# Patient Record
Sex: Male | Born: 1955
Health system: Southern US, Community
[De-identification: ages and names within clinical notes are randomized; demographics above are authoritative.]

## PROBLEM LIST (undated history)

## (undated) DIAGNOSIS — R918 Other nonspecific abnormal finding of lung field: Secondary | ICD-10-CM

## (undated) DIAGNOSIS — K631 Perforation of intestine (nontraumatic): Secondary | ICD-10-CM

## (undated) DIAGNOSIS — C3490 Malignant neoplasm of unspecified part of unspecified bronchus or lung: Secondary | ICD-10-CM

## (undated) DIAGNOSIS — J45909 Unspecified asthma, uncomplicated: Secondary | ICD-10-CM

## (undated) DIAGNOSIS — E669 Obesity, unspecified: Secondary | ICD-10-CM

## (undated) DIAGNOSIS — C3492 Malignant neoplasm of unspecified part of left bronchus or lung: Secondary | ICD-10-CM

## (undated) DIAGNOSIS — I5032 Chronic diastolic (congestive) heart failure: Secondary | ICD-10-CM

## (undated) DIAGNOSIS — R5382 Chronic fatigue, unspecified: Secondary | ICD-10-CM

## (undated) DIAGNOSIS — I700 Atherosclerosis of aorta: Secondary | ICD-10-CM

## (undated) HISTORY — DX: Malignant neoplasm of unspecified part of left bronchus or lung: C34.92

## (undated) HISTORY — PX: HERNIA REPAIR: SHX51

## (undated) HISTORY — DX: Other nonspecific abnormal finding of lung field: R91.8

## (undated) HISTORY — DX: Chronic fatigue, unspecified: R53.82

## (undated) HISTORY — DX: Malignant neoplasm of unspecified part of unspecified bronchus or lung: C34.90

## (undated) HISTORY — PX: CHOLECYSTECTOMY: SHX55

## (undated) HISTORY — DX: Obesity, unspecified: E66.9

## (undated) HISTORY — DX: Chronic diastolic (congestive) heart failure: I50.32

---

## 2003-04-23 ENCOUNTER — Emergency Department (HOSPITAL_COMMUNITY): Admission: EM | Admit: 2003-04-23 | Discharge: 2003-04-23 | Payer: Self-pay | Admitting: Emergency Medicine

## 2003-04-23 ENCOUNTER — Encounter: Payer: Self-pay | Admitting: Emergency Medicine

## 2003-05-25 ENCOUNTER — Encounter: Payer: Self-pay | Admitting: Orthopaedic Surgery

## 2003-05-25 ENCOUNTER — Ambulatory Visit (HOSPITAL_COMMUNITY): Admission: RE | Admit: 2003-05-25 | Discharge: 2003-05-25 | Payer: Self-pay | Admitting: Orthopaedic Surgery

## 2005-05-10 ENCOUNTER — Ambulatory Visit (HOSPITAL_COMMUNITY): Admission: RE | Admit: 2005-05-10 | Discharge: 2005-05-10 | Payer: Self-pay | Admitting: Family Medicine

## 2005-05-15 ENCOUNTER — Ambulatory Visit (HOSPITAL_COMMUNITY): Admission: RE | Admit: 2005-05-15 | Discharge: 2005-05-15 | Payer: Self-pay | Admitting: Family Medicine

## 2005-05-30 ENCOUNTER — Ambulatory Visit (HOSPITAL_COMMUNITY): Admission: RE | Admit: 2005-05-30 | Discharge: 2005-05-30 | Payer: Self-pay | Admitting: Family Medicine

## 2009-12-24 ENCOUNTER — Ambulatory Visit (HOSPITAL_COMMUNITY): Admission: RE | Admit: 2009-12-24 | Discharge: 2009-12-24 | Payer: Self-pay | Admitting: Family Medicine

## 2010-01-01 ENCOUNTER — Emergency Department (HOSPITAL_COMMUNITY): Admission: EM | Admit: 2010-01-01 | Discharge: 2010-01-01 | Payer: Self-pay | Admitting: Emergency Medicine

## 2010-01-01 ENCOUNTER — Ambulatory Visit (HOSPITAL_COMMUNITY): Admission: RE | Admit: 2010-01-01 | Discharge: 2010-01-01 | Payer: Self-pay | Admitting: Emergency Medicine

## 2010-01-07 ENCOUNTER — Ambulatory Visit (HOSPITAL_COMMUNITY): Admission: RE | Admit: 2010-01-07 | Discharge: 2010-01-07 | Payer: Self-pay | Admitting: Internal Medicine

## 2010-01-12 ENCOUNTER — Emergency Department (HOSPITAL_COMMUNITY): Admission: EM | Admit: 2010-01-12 | Discharge: 2010-01-13 | Payer: Self-pay | Admitting: Emergency Medicine

## 2010-01-16 ENCOUNTER — Emergency Department (HOSPITAL_COMMUNITY): Admission: EM | Admit: 2010-01-16 | Discharge: 2010-01-16 | Payer: Self-pay | Admitting: Emergency Medicine

## 2010-01-17 ENCOUNTER — Inpatient Hospital Stay (HOSPITAL_COMMUNITY): Admission: AD | Admit: 2010-01-17 | Discharge: 2010-01-23 | Payer: Self-pay

## 2010-01-21 ENCOUNTER — Encounter (INDEPENDENT_AMBULATORY_CARE_PROVIDER_SITE_OTHER): Payer: Self-pay

## 2010-02-02 ENCOUNTER — Encounter (INDEPENDENT_AMBULATORY_CARE_PROVIDER_SITE_OTHER): Payer: Self-pay | Admitting: *Deleted

## 2010-10-06 ENCOUNTER — Emergency Department (HOSPITAL_COMMUNITY): Admission: EM | Admit: 2010-10-06 | Discharge: 2009-12-31 | Payer: Self-pay | Admitting: Emergency Medicine

## 2010-11-20 ENCOUNTER — Encounter: Payer: Self-pay | Admitting: Internal Medicine

## 2010-11-20 ENCOUNTER — Encounter: Payer: Self-pay | Admitting: Family Medicine

## 2010-11-29 NOTE — Letter (Signed)
Summary: Generic Letter, Intro to Referring  [facility]  [address]   [place], [place] [address]   Phone: [phone]  Fax: [phone]      February 02, 2010             RE: James Zamora   1956/04/15                 [address]                 [place], [place]  [address]  Dear Kemper Durie,    The above mentioned patient has had three appointments with our office. The first two he cancelled and then today he was a noshow. He will need another referral before he can make another appointment with our office.   Sincerely,    [facility] Ph: [phone]   Fax: [phone]

## 2011-01-23 LAB — COMPREHENSIVE METABOLIC PANEL
ALT: 10 U/L (ref 0–53)
AST: 10 U/L (ref 0–37)
Albumin: 3.1 g/dL — ABNORMAL LOW (ref 3.5–5.2)
Albumin: 3.5 g/dL (ref 3.5–5.2)
Alkaline Phosphatase: 64 U/L (ref 39–117)
Calcium: 8.9 mg/dL (ref 8.4–10.5)
Chloride: 101 mEq/L (ref 96–112)
GFR calc Af Amer: >60 mL/min (ref >60)
GFR calc Af Amer: >60 mL/min (ref >60)
Glucose, Bld: 93 mg/dL (ref 70–99)
Potassium: 4 mEq/L (ref 3.5–5.1)
Sodium: 135 mEq/L (ref 135–145)
Total Bilirubin: 0.6 mg/dL (ref 0.3–1.2)
Total Protein: 8.2 g/dL (ref 6.0–8.3)

## 2011-01-23 LAB — DIFFERENTIAL
Basophils Absolute: 0 10*3/uL (ref 0.0–0.1)
Basophils Absolute: 0 10*3/uL (ref 0.0–0.1)
Basophils Absolute: 0 10*3/uL (ref 0.0–0.1)
Basophils Relative: 0 % (ref 0–1)
Basophils Relative: 0 % (ref 0–1)
Eosinophils Absolute: 0 10*3/uL (ref 0.0–0.7)
Eosinophils Absolute: 0.1 10*3/uL (ref 0.0–0.7)
Eosinophils Absolute: 0.1 10*3/uL (ref 0.0–0.7)
Eosinophils Absolute: 0.2 10*3/uL (ref 0.0–0.7)
Eosinophils Absolute: 0.2 10*3/uL (ref 0.0–0.7)
Eosinophils Relative: 0 % (ref 0–5)
Eosinophils Relative: 2 % (ref 0–5)
Eosinophils Relative: 2 % (ref 0–5)
Eosinophils Relative: 3 % (ref 0–5)
Lymphocytes Relative: 14 % (ref 12–46)
Lymphocytes Relative: 22 % (ref 12–46)
Lymphocytes Relative: 8 % — ABNORMAL LOW (ref 12–46)
Lymphs Abs: 0.7 10*3/uL (ref 0.7–4.0)
Lymphs Abs: 0.9 10*3/uL (ref 0.7–4.0)
Lymphs Abs: 1.4 10*3/uL (ref 0.7–4.0)
Lymphs Abs: 1.6 10*3/uL (ref 0.7–4.0)
Lymphs Abs: 1.8 10*3/uL (ref 0.7–4.0)
Monocytes Absolute: 0 10*3/uL — ABNORMAL LOW (ref 0.1–1.0)
Monocytes Absolute: 0.4 10*3/uL (ref 0.1–1.0)
Monocytes Absolute: 0.4 10*3/uL (ref 0.1–1.0)
Monocytes Relative: 0 % — ABNORMAL LOW (ref 3–12)
Monocytes Relative: 2 % — ABNORMAL LOW (ref 3–12)
Monocytes Relative: 3 % (ref 3–12)
Monocytes Relative: 4 % (ref 3–12)
Monocytes Relative: 5 % (ref 3–12)
Neutro Abs: 6.2 10*3/uL (ref 1.7–7.7)
Neutro Abs: 7.8 10*3/uL — ABNORMAL HIGH (ref 1.7–7.7)
Neutrophils Relative %: 78 % — ABNORMAL HIGH (ref 43–77)
Neutrophils Relative %: 79 % — ABNORMAL HIGH (ref 43–77)
Neutrophils Relative %: 82 % — ABNORMAL HIGH (ref 43–77)
Neutrophils Relative %: 82 % — ABNORMAL HIGH (ref 43–77)

## 2011-01-23 LAB — CBC
HCT: 40.3 % (ref 39.0–52.0)
HCT: 40.5 % (ref 39.0–52.0)
HCT: 42.6 % (ref 39.0–52.0)
HCT: 43.8 % (ref 39.0–52.0)
Hemoglobin: 12.3 g/dL — ABNORMAL LOW (ref 13.0–17.0)
Hemoglobin: 12.5 g/dL — ABNORMAL LOW (ref 13.0–17.0)
Hemoglobin: 13 g/dL (ref 13.0–17.0)
Hemoglobin: 13.8 g/dL (ref 13.0–17.0)
Hemoglobin: 14.4 g/dL (ref 13.0–17.0)
MCHC: 31.7 g/dL (ref 30.0–36.0)
MCHC: 32.7 g/dL (ref 30.0–36.0)
MCV: 74.8 fL — ABNORMAL LOW (ref 78.0–100.0)
MCV: 74.8 fL — ABNORMAL LOW (ref 78.0–100.0)
MCV: 75 fL — ABNORMAL LOW (ref 78.0–100.0)
Platelets: 252 10*3/uL (ref 150–400)
Platelets: 261 10*3/uL (ref 150–400)
Platelets: 262 10*3/uL (ref 150–400)
Platelets: 294 10*3/uL (ref 150–400)
Platelets: 321 10*3/uL (ref 150–400)
Platelets: 341 10*3/uL (ref 150–400)
Platelets: 341 10*3/uL (ref 150–400)
RBC: 5.01 MIL/uL (ref 4.22–5.81)
RBC: 5.41 MIL/uL (ref 4.22–5.81)
RDW: 16 % — ABNORMAL HIGH (ref 11.5–15.5)
RDW: 16.7 % — ABNORMAL HIGH (ref 11.5–15.5)
RDW: 17 % — ABNORMAL HIGH (ref 11.5–15.5)
RDW: 17.3 % — ABNORMAL HIGH (ref 11.5–15.5)
WBC: 11 10*3/uL — ABNORMAL HIGH (ref 4.0–10.5)
WBC: 11 10*3/uL — ABNORMAL HIGH (ref 4.0–10.5)
WBC: 12.1 10*3/uL — ABNORMAL HIGH (ref 4.0–10.5)
WBC: 8.3 10*3/uL (ref 4.0–10.5)
WBC: 8.3 10*3/uL (ref 4.0–10.5)

## 2011-01-23 LAB — ANTI-SMITH ANTIBODY: ENA SM Ab Ser-aCnc: <0.2 AI (ref <1.0)

## 2011-01-23 LAB — ANTIPHOSPHOLIPID SYNDROME EVAL, BLD
Anticardiolipin IgG: 5 GPL U/mL — ABNORMAL LOW (ref <23)
Anticardiolipin IgM: 3 MPL U/mL — ABNORMAL LOW (ref <11)
Phosphatydalserine, IgG: <10 U/mL (ref <10)
Phosphatydalserine, IgM: >100 U/mL — ABNORMAL HIGH (ref <25)

## 2011-01-23 LAB — GLUCOSE, SEROUS FLUID

## 2011-01-23 LAB — ANA: Anti Nuclear Antibody(ANA): POSITIVE — AB

## 2011-01-23 LAB — HEPATIC FUNCTION PANEL
ALT: 14 U/L (ref 0–53)
AST: 15 U/L (ref 0–37)
AST: 18 U/L (ref 0–37)
Albumin: 3.2 g/dL — ABNORMAL LOW (ref 3.5–5.2)
Alkaline Phosphatase: 72 U/L (ref 39–117)
Alkaline Phosphatase: 73 U/L (ref 39–117)
Bilirubin, Direct: 0.2 mg/dL (ref 0.0–0.3)
Indirect Bilirubin: 0.5 mg/dL (ref 0.3–0.9)
Total Bilirubin: 0.3 mg/dL (ref 0.3–1.2)

## 2011-01-23 LAB — LUPUS ANTICOAGULANT PANEL: PTT Lupus Anticoagulant: 34.5 secs (ref 32.0–43.4)

## 2011-01-23 LAB — BODY FLUID CELL COUNT WITH DIFFERENTIAL
Eos, Fluid: 19 %
Lymphs, Fluid: 34 %
Monocyte-Macrophage-Serous Fluid: 43 % — ABNORMAL LOW (ref 50–90)
Neutrophil Count, Fluid: 4 % (ref 0–25)
Total Nucleated Cell Count, Fluid: 2330 cu mm — ABNORMAL HIGH (ref 0–1000)

## 2011-01-23 LAB — ANTI-NUCLEAR AB-TITER (ANA TITER): ANA Titer 1: 1:80 {titer} — ABNORMAL HIGH

## 2011-01-23 LAB — POCT I-STAT, CHEM 8
Creatinine, Ser: 0.9 mg/dL (ref 0.4–1.5)
HCT: 43 % (ref 39.0–52.0)
Hemoglobin: 14.6 g/dL (ref 13.0–17.0)
Potassium: 4.3 mEq/L (ref 3.5–5.1)
Sodium: 135 mEq/L (ref 135–145)

## 2011-01-23 LAB — BASIC METABOLIC PANEL
BUN: 12 mg/dL (ref 6–23)
CO2: 30 mEq/L (ref 19–32)
Calcium: 8.9 mg/dL (ref 8.4–10.5)
Calcium: 9 mg/dL (ref 8.4–10.5)
Creatinine, Ser: 1.2 mg/dL (ref 0.4–1.5)
GFR calc non Af Amer: >60 mL/min (ref >60)
GFR calc non Af Amer: >60 mL/min (ref >60)
Glucose, Bld: 101 mg/dL — ABNORMAL HIGH (ref 70–99)
Potassium: 3.7 mEq/L (ref 3.5–5.1)
Potassium: 4.1 mEq/L (ref 3.5–5.1)
Sodium: 136 mEq/L (ref 135–145)
Sodium: 137 mEq/L (ref 135–145)

## 2011-01-23 LAB — PROTEIN, BODY FLUID: Total protein, fluid: 5.2 g/dL

## 2011-01-23 LAB — AMYLASE: Amylase: 49 U/L (ref 0–105)

## 2011-01-23 LAB — URINALYSIS, ROUTINE W REFLEX MICROSCOPIC
Ketones, ur: NEGATIVE mg/dL
Specific Gravity, Urine: >1.03 — ABNORMAL HIGH (ref 1.005–1.030)
pH: 6 (ref 5.0–8.0)

## 2011-01-23 LAB — POCT CARDIAC MARKERS: Myoglobin, poc: 111 ng/mL (ref 12–200)

## 2011-01-23 LAB — MAGNESIUM: Magnesium: 1.9 mg/dL (ref 1.5–2.5)

## 2011-01-23 LAB — RHEUMATOID FACTOR: Rhuematoid fact SerPl-aCnc: 153 IU/mL — ABNORMAL HIGH (ref 0–20)

## 2011-01-23 LAB — BODY FLUID CULTURE: Culture: NO GROWTH

## 2011-01-23 LAB — AFB CULTURE WITH SMEAR (NOT AT ARMC)

## 2011-01-23 LAB — ANTI-DNA ANTIBODY, DOUBLE-STRANDED: ds DNA Ab: 1 IU/mL (ref <5)

## 2011-06-12 ENCOUNTER — Encounter: Payer: Self-pay | Admitting: *Deleted

## 2011-06-12 ENCOUNTER — Emergency Department (HOSPITAL_COMMUNITY)
Admission: EM | Admit: 2011-06-12 | Discharge: 2011-06-12 | Disposition: A | Discharge status: Home / Self Care | Payer: Medicare Other | Attending: Emergency Medicine | Admitting: Emergency Medicine

## 2011-06-12 DIAGNOSIS — L0390 Cellulitis, unspecified: Secondary | ICD-10-CM

## 2011-06-12 DIAGNOSIS — L02419 Cutaneous abscess of limb, unspecified: Secondary | ICD-10-CM | POA: Insufficient documentation

## 2011-06-12 LAB — CBC
HCT: 40.4 % (ref 39.0–52.0)
Hemoglobin: 12.6 g/dL — ABNORMAL LOW (ref 13.0–17.0)
MCV: 78.6 fL (ref 78.0–100.0)
RBC: 5.14 MIL/uL (ref 4.22–5.81)
RDW: 14.3 % (ref 11.5–15.5)
WBC: 7.5 10*3/uL (ref 4.0–10.5)

## 2011-06-12 LAB — BASIC METABOLIC PANEL
BUN: 8 mg/dL (ref 6–23)
CO2: 27 mEq/L (ref 19–32)
Chloride: 100 mEq/L (ref 96–112)
GFR calc Af Amer: >60 mL/min (ref >60)
Potassium: 4.3 mEq/L (ref 3.5–5.1)

## 2011-06-12 MED ORDER — CEPHALEXIN 500 MG PO CAPS: 500.0000 mg | ORAL_CAPSULE | Freq: Four times a day (QID) | ORAL | Status: AC

## 2011-06-12 NOTE — ED Notes (Signed)
Pt will not answer questions. Mother states pt has not been taking any of his medication like he should. States pt's legs have been swollen for a while. Mom states they take the same kind of medication and she has been given him some of hers

## 2011-06-12 NOTE — ED Notes (Signed)
Pt c/o swelling and drainage to his left leg for a long time. Pt will not tell details. Pt's mother is unsure of how long it has been like that. Extreme swelling noted to left lower extremity. Foul smell noted from leg also. Pt has dressing in place.

## 2011-06-12 NOTE — ED Notes (Signed)
Pt waiting for lab result. Delay explained to pt and family.

## 2011-07-18 NOTE — ED Provider Notes (Signed)
History     CSN: [id number] Arrival date & time: 06/12/2011 11:19 AM   Chief Complaint  Patient presents with  . Leg Swelling     (Include location/radiation/quality/duration/timing/severity/associated sxs/prior treatment) HPI  He c/o left leg swelling with sores on it and a foul odor. No fever or chills   History reviewed. No pertinent past medical history.   Past Surgical History  Procedure Date  . Cholecystectomy     Family History  Problem Relation Age of Onset  . Diabetes Mother   . Asthma Father     History  Substance Use Topics  . Smoking status: Never Smoker   . Smokeless tobacco: Not on file  . Alcohol Use: No      Review of Systems  HENT: Negative for nosebleeds.   Respiratory: Negative for cough.   Cardiovascular: Negative for chest pain.  Gastrointestinal: Negative for abdominal pain.  Skin: Positive for rash.       redness  Neurological: Negative for headaches.    Allergies  Bee and Penicillins  Home Medications   Current Outpatient Rx  Name Route Sig Dispense Refill  . DIPHENHYDRAMINE HCL 25 MG PO CAPS Oral Take 25 mg by mouth every 6 (six) hours as needed. For allergies       Physical Exam    BP 136/79  Pulse 92  Temp(Src) 98.5 F (36.9 C) (Oral)  Resp 22  SpO2 94%  Physical Exam  Constitutional: He appears well-developed and well-nourished.  HENT:  Head: Normocephalic and atraumatic.  Eyes: Pupils are equal, round, and reactive to light.  Neck: Normal range of motion.  Abdominal: He exhibits no distension.  Musculoskeletal: He exhibits edema.  Neurological: He is alert.  Skin: Skin is warm. There is erythema.       Left leg red and swollen    ED Course  Procedures  Results for orders placed during the hospital encounter of 06/12/11  CBC      Component Value Range   WBC 7.5  4.0 - 10.5 (K/uL)   RBC 5.14  4.22 - 5.81 (MIL/uL)   Hemoglobin 12.6 (*) 13.0 - 17.0 (g/dL)   HCT 91.4  78.2 - 95.6 (%)   MCV 78.6  78.0 -  100.0 (fL)   MCH 24.5 (*) 26.0 - 34.0 (pg)   MCHC 31.2  30.0 - 36.0 (g/dL)   RDW 21.3  08.6 - 57.8 (%)   Platelets 336  150 - 400 (K/uL)  BASIC METABOLIC PANEL      Component Value Range   Sodium 138  135 - 145 (mEq/L)   Potassium 4.3  3.5 - 5.1 (mEq/L)   Chloride 100  96 - 112 (mEq/L)   CO2 27  19 - 32 (mEq/L)   Glucose, Bld 96  70 - 99 (mg/dL)   BUN 8  6 - 23 (mg/dL)   Creatinine, Ser 4.69  0.50 - 1.35 (mg/dL)   Calcium 9.5  8.4 - 62.9 (mg/dL)   GFR calc non Af Amer >60  >60 (mL/min)   GFR calc Af Amer >60  >60 (mL/min)   No results found.   1. Cellulitis      MDM cellulitis       Nicholes Stairs, MD 07/18/11 [phone]

## 2015-11-12 ENCOUNTER — Encounter (HOSPITAL_COMMUNITY): Payer: Self-pay | Admitting: *Deleted

## 2015-11-12 DIAGNOSIS — L03116 Cellulitis of left lower limb: Secondary | ICD-10-CM | POA: Diagnosis not present

## 2015-11-12 DIAGNOSIS — L97929 Non-pressure chronic ulcer of unspecified part of left lower leg with unspecified severity: Secondary | ICD-10-CM | POA: Diagnosis present

## 2015-11-12 DIAGNOSIS — R0602 Shortness of breath: Secondary | ICD-10-CM | POA: Diagnosis not present

## 2015-11-12 DIAGNOSIS — Z880 Allergy status to penicillin: Secondary | ICD-10-CM

## 2015-11-12 DIAGNOSIS — N179 Acute kidney failure, unspecified: Principal | ICD-10-CM | POA: Diagnosis present

## 2015-11-12 DIAGNOSIS — A084 Viral intestinal infection, unspecified: Secondary | ICD-10-CM | POA: Diagnosis present

## 2015-11-12 DIAGNOSIS — R1084 Generalized abdominal pain: Secondary | ICD-10-CM | POA: Diagnosis not present

## 2015-11-12 DIAGNOSIS — J9811 Atelectasis: Secondary | ICD-10-CM | POA: Diagnosis not present

## 2015-11-12 DIAGNOSIS — R911 Solitary pulmonary nodule: Secondary | ICD-10-CM | POA: Diagnosis not present

## 2015-11-12 DIAGNOSIS — E860 Dehydration: Secondary | ICD-10-CM | POA: Diagnosis not present

## 2015-11-12 DIAGNOSIS — S81802A Unspecified open wound, left lower leg, initial encounter: Secondary | ICD-10-CM | POA: Diagnosis not present

## 2015-11-12 DIAGNOSIS — R112 Nausea with vomiting, unspecified: Secondary | ICD-10-CM | POA: Diagnosis not present

## 2015-11-12 NOTE — ED Notes (Signed)
Pt c/o n/v, abd pain that started today, denies any fever, chills, diarrhea,

## 2015-11-13 ENCOUNTER — Encounter (HOSPITAL_COMMUNITY): Payer: Self-pay | Admitting: *Deleted

## 2015-11-13 ENCOUNTER — Inpatient Hospital Stay (HOSPITAL_COMMUNITY)
Admission: EM | Admit: 2015-11-13 | Discharge: 2015-11-14 | DRG: 683 | Disposition: A | Discharge status: Home / Self Care | Payer: Medicare Other | Attending: Internal Medicine | Admitting: Internal Medicine

## 2015-11-13 ENCOUNTER — Emergency Department (HOSPITAL_COMMUNITY): Payer: Medicare Other

## 2015-11-13 DIAGNOSIS — R911 Solitary pulmonary nodule: Secondary | ICD-10-CM

## 2015-11-13 DIAGNOSIS — L03116 Cellulitis of left lower limb: Secondary | ICD-10-CM | POA: Diagnosis present

## 2015-11-13 DIAGNOSIS — C3490 Malignant neoplasm of unspecified part of unspecified bronchus or lung: Secondary | ICD-10-CM

## 2015-11-13 DIAGNOSIS — T148 Other injury of unspecified body region: Secondary | ICD-10-CM

## 2015-11-13 DIAGNOSIS — L97929 Non-pressure chronic ulcer of unspecified part of left lower leg with unspecified severity: Secondary | ICD-10-CM | POA: Diagnosis present

## 2015-11-13 DIAGNOSIS — A084 Viral intestinal infection, unspecified: Secondary | ICD-10-CM | POA: Diagnosis present

## 2015-11-13 DIAGNOSIS — E860 Dehydration: Secondary | ICD-10-CM | POA: Diagnosis not present

## 2015-11-13 DIAGNOSIS — L089 Local infection of the skin and subcutaneous tissue, unspecified: Secondary | ICD-10-CM

## 2015-11-13 DIAGNOSIS — R112 Nausea with vomiting, unspecified: Secondary | ICD-10-CM

## 2015-11-13 DIAGNOSIS — T148XXA Other injury of unspecified body region, initial encounter: Secondary | ICD-10-CM

## 2015-11-13 DIAGNOSIS — N179 Acute kidney failure, unspecified: Secondary | ICD-10-CM | POA: Diagnosis not present

## 2015-11-13 DIAGNOSIS — R109 Unspecified abdominal pain: Secondary | ICD-10-CM

## 2015-11-13 DIAGNOSIS — Z880 Allergy status to penicillin: Secondary | ICD-10-CM | POA: Diagnosis not present

## 2015-11-13 DIAGNOSIS — R0602 Shortness of breath: Secondary | ICD-10-CM | POA: Diagnosis not present

## 2015-11-13 DIAGNOSIS — J9811 Atelectasis: Secondary | ICD-10-CM | POA: Diagnosis not present

## 2015-11-13 DIAGNOSIS — S81802A Unspecified open wound, left lower leg, initial encounter: Secondary | ICD-10-CM

## 2015-11-13 DIAGNOSIS — R1084 Generalized abdominal pain: Secondary | ICD-10-CM | POA: Diagnosis not present

## 2015-11-13 HISTORY — DX: Malignant neoplasm of unspecified part of unspecified bronchus or lung: C34.90

## 2015-11-13 HISTORY — DX: Unspecified asthma, uncomplicated: J45.909

## 2015-11-13 LAB — COMPREHENSIVE METABOLIC PANEL
ALBUMIN: 3.1 g/dL — AB (ref 3.5–5.0)
ALK PHOS: 81 U/L (ref 38–126)
ALT: 11 U/L — AB (ref 17–63)
AST: 15 U/L (ref 15–41)
Anion gap: 9 (ref 5–15)
BILIRUBIN TOTAL: 0.5 mg/dL (ref 0.3–1.2)
BUN: 24 mg/dL — AB (ref 6–20)
CALCIUM: 8.8 mg/dL — AB (ref 8.9–10.3)
CO2: 25 mmol/L (ref 22–32)
CREATININE: 1.53 mg/dL — AB (ref 0.61–1.24)
Chloride: 97 mmol/L — ABNORMAL LOW (ref 101–111)
GFR calc Af Amer: 56 mL/min — ABNORMAL LOW (ref >60)
GFR calc non Af Amer: 48 mL/min — ABNORMAL LOW (ref >60)
GLUCOSE: 130 mg/dL — AB (ref 65–99)
Potassium: 4.5 mmol/L (ref 3.5–5.1)
Sodium: 131 mmol/L — ABNORMAL LOW (ref 135–145)
TOTAL PROTEIN: 9.6 g/dL — AB (ref 6.5–8.1)

## 2015-11-13 LAB — CBC WITH DIFFERENTIAL/PLATELET
BASOS ABS: 0 10*3/uL (ref 0.0–0.1)
BASOS PCT: 0 %
EOS PCT: 0 %
Eosinophils Absolute: 0 10*3/uL (ref 0.0–0.7)
HCT: 36.9 % — ABNORMAL LOW (ref 39.0–52.0)
Hemoglobin: 11.4 g/dL — ABNORMAL LOW (ref 13.0–17.0)
Lymphocytes Relative: 11 %
Lymphs Abs: 1.1 10*3/uL (ref 0.7–4.0)
MCH: 21.3 pg — ABNORMAL LOW (ref 26.0–34.0)
MCHC: 30.9 g/dL (ref 30.0–36.0)
MCV: 69 fL — ABNORMAL LOW (ref 78.0–100.0)
MONO ABS: 0.7 10*3/uL (ref 0.1–1.0)
Monocytes Relative: 7 %
Neutro Abs: 8.2 10*3/uL — ABNORMAL HIGH (ref 1.7–7.7)
Neutrophils Relative %: 82 %
PLATELETS: 465 10*3/uL — AB (ref 150–400)
RBC: 5.35 MIL/uL (ref 4.22–5.81)
RDW: 17.3 % — AB (ref 11.5–15.5)
WBC: 10.1 10*3/uL (ref 4.0–10.5)

## 2015-11-13 LAB — I-STAT CHEM 8, ED
BUN: 24 mg/dL — AB (ref 6–20)
CREATININE: 1.4 mg/dL — AB (ref 0.61–1.24)
Calcium, Ion: 1.17 mmol/L (ref 1.12–1.23)
Chloride: 99 mmol/L — ABNORMAL LOW (ref 101–111)
Glucose, Bld: 130 mg/dL — ABNORMAL HIGH (ref 65–99)
HEMATOCRIT: 43 % (ref 39.0–52.0)
HEMOGLOBIN: 14.6 g/dL (ref 13.0–17.0)
POTASSIUM: 4.6 mmol/L (ref 3.5–5.1)
Sodium: 136 mmol/L (ref 135–145)
TCO2: 26 mmol/L (ref 0–100)

## 2015-11-13 LAB — URINALYSIS, ROUTINE W REFLEX MICROSCOPIC
Bilirubin Urine: NEGATIVE
GLUCOSE, UA: NEGATIVE mg/dL
Ketones, ur: NEGATIVE mg/dL
LEUKOCYTES UA: NEGATIVE
Nitrite: NEGATIVE
PH: 5 (ref 5.0–8.0)
PROTEIN: NEGATIVE mg/dL
Specific Gravity, Urine: 1.025 (ref 1.005–1.030)

## 2015-11-13 LAB — URINE MICROSCOPIC-ADD ON

## 2015-11-13 LAB — TROPONIN I: Troponin I: <0.03 ng/mL (ref <0.031)

## 2015-11-13 LAB — LIPASE, BLOOD: Lipase: 25 U/L (ref 11–51)

## 2015-11-13 MED ORDER — SODIUM CHLORIDE 0.9 % IV BOLUS (SEPSIS)
500.0000 mL | Freq: Once | INTRAVENOUS | Status: AC
  Administered 2015-11-13: 500 mL via INTRAVENOUS

## 2015-11-13 MED ORDER — ONDANSETRON HCL 4 MG PO TABS: 4.0000 mg | ORAL_TABLET | Freq: Four times a day (QID) | ORAL | Status: DC | PRN

## 2015-11-13 MED ORDER — ENOXAPARIN SODIUM 60 MG/0.6ML ~~LOC~~ SOLN
50.0000 mg | SUBCUTANEOUS | Status: DC
  Administered 2015-11-13 – 2015-11-14 (×2): 50 mg via SUBCUTANEOUS
  Filled 2015-11-13 (×2): qty 0.6

## 2015-11-13 MED ORDER — ONDANSETRON HCL 4 MG/2ML IJ SOLN
4.0000 mg | Freq: Four times a day (QID) | INTRAMUSCULAR | Status: DC | PRN
  Administered 2015-11-13: 4 mg via INTRAVENOUS
  Filled 2015-11-13: qty 2

## 2015-11-13 MED ORDER — IOHEXOL 300 MG/ML  SOLN
100.0000 mL | Freq: Once | INTRAMUSCULAR | Status: AC | PRN
  Administered 2015-11-13: 100 mL via INTRAVENOUS

## 2015-11-13 MED ORDER — ONDANSETRON 8 MG PO TBDP
8.0000 mg | ORAL_TABLET | Freq: Once | ORAL | Status: AC
  Administered 2015-11-13: 8 mg via ORAL
  Filled 2015-11-13: qty 1

## 2015-11-13 MED ORDER — ONDANSETRON HCL 4 MG/2ML IJ SOLN
4.0000 mg | Freq: Once | INTRAMUSCULAR | Status: AC
  Administered 2015-11-13: 4 mg via INTRAVENOUS
  Filled 2015-11-13: qty 2

## 2015-11-13 MED ORDER — CETYLPYRIDINIUM CHLORIDE 0.05 % MT LIQD
7.0000 mL | Freq: Two times a day (BID) | OROMUCOSAL | Status: DC
  Administered 2015-11-13 (×2): 7 mL via OROMUCOSAL

## 2015-11-13 MED ORDER — SODIUM CHLORIDE 0.9 % IV SOLN
INTRAVENOUS | Status: DC
  Administered 2015-11-13: 11:00:00 via INTRAVENOUS

## 2015-11-13 MED ORDER — ACETAMINOPHEN 325 MG PO TABS: 650.0000 mg | ORAL_TABLET | Freq: Four times a day (QID) | ORAL | Status: DC | PRN

## 2015-11-13 MED ORDER — MORPHINE SULFATE (PF) 2 MG/ML IV SOLN
2.0000 mg | INTRAVENOUS | Status: DC | PRN
  Administered 2015-11-13 (×2): 2 mg via INTRAVENOUS
  Filled 2015-11-13 (×2): qty 1

## 2015-11-13 MED ORDER — ACETAMINOPHEN 650 MG RE SUPP: 650.0000 mg | Freq: Four times a day (QID) | RECTAL | Status: DC | PRN

## 2015-11-13 MED ORDER — CEFAZOLIN SODIUM 1-5 GM-% IV SOLN
1.0000 g | Freq: Three times a day (TID) | INTRAVENOUS | Status: DC
Start: 2015-11-13 — End: 2015-11-14
  Administered 2015-11-13 – 2015-11-14 (×4): 1 g via INTRAVENOUS
  Filled 2015-11-13 (×7): qty 50

## 2015-11-13 MED ORDER — HYDROCERIN EX CREA
TOPICAL_CREAM | Freq: Two times a day (BID) | CUTANEOUS | Status: DC
  Administered 2015-11-13 (×2): via TOPICAL
  Filled 2015-11-13: qty 113

## 2015-11-13 MED ORDER — MORPHINE SULFATE (PF) 4 MG/ML IV SOLN
4.0000 mg | Freq: Once | INTRAVENOUS | Status: AC
  Administered 2015-11-13: 4 mg via INTRAVENOUS
  Filled 2015-11-13: qty 1

## 2015-11-13 NOTE — ED Provider Notes (Signed)
CSN: [id number]     Arrival date & time 11/12/15  2242 History  By signing my name below, I, Emmanuella Mensah, attest that this documentation has been prepared under the direction and in the presence of Ripley Fraise, MD. Electronically Signed: Judithann Sauger, ED Scribe. 11/13/2015. 1:04 AM.      Chief Complaint  Patient presents with  . Abdominal Pain   Patient is a 60 y.o. male presenting with abdominal pain. The history is provided by the patient. No language interpreter was used.  Abdominal Pain Pain location:  Generalized Pain radiates to:  Does not radiate Pain severity:  Moderate Onset quality:  Gradual Duration:  24 hours Timing:  Constant Progression:  Worsening Chronicity:  New Relieved by:  None tried Worsened by:  Nothing tried Ineffective treatments:  None tried Associated symptoms: nausea, shortness of breath and vomiting   Associated symptoms: no chest pain, no chills, no cough and no diarrhea    HPI Comments: James Zamora is a 60 y.o. male who presents to the Emergency Department complaining of gradually worsening constant non-radiating generalized abdominal pain onset today. He reports associated vomiting of non-bloody material and SOB. No alleviating factors noted. Pt has not tried any medications for his symptoms.  He denies any fever, chills, or diarrhea.   Pt also presents with a gradually worsening ulcer to his left lower leg and bilateral leg swelling onset one year ago.   Pt no longer have a PCP and does not want to establish care with another provider.   PMH - none Past Surgical History  Procedure Laterality Date  . Cholecystectomy     Family History  Problem Relation Age of Onset  . Diabetes Mother   . Asthma Father    Social History  Substance Use Topics  . Smoking status: Never Smoker   . Smokeless tobacco: None  . Alcohol Use: No    Review of Systems  Constitutional: Negative for chills.  Respiratory: Positive for shortness of  breath. Negative for cough.   Cardiovascular: Positive for leg swelling. Negative for chest pain.  Gastrointestinal: Positive for nausea, vomiting and abdominal pain. Negative for diarrhea.  Skin:       Ulcer to his left lower leg and left foot  All other systems reviewed and are negative.     Allergies  Nutritional supplements and Penicillins  Home Medications   Prior to Admission medications   Medication Sig Start Date End Date Taking? Authorizing Provider  diphenhydrAMINE (BENADRYL) 25 mg capsule Take 25 mg by mouth every 6 (six) hours as needed. For allergies     Historical Provider, MD   BP 120/68 mmHg  Pulse 102  Temp(Src) 98 F (36.7 C) (Oral)  Resp 20  Ht '5\' 8"'$  (1.727 m)  Wt 224 lb (101.606 kg)  BMI 34.07 kg/m2  SpO2 99% Physical Exam  Nursing note and vitals reviewed.  CONSTITUTIONAL: Dis shelved  HEAD: Normocephalic/atraumatic EYES: EOMI/PERRL ENMT: Mucous membranes moist NECK: supple no meningeal signs SPINE/BACK:entire spine nontender CV: S1/S2 noted, no murmurs/rubs/gallops noted LUNGS: Tachypneic, crackles bilaterally  ABDOMEN: soft, moderate diffuse tenderness, no rebound or guarding, bowel sounds noted throughout abdomen GU:no cva tenderness NEURO: Pt is awake/alert/appropriate, moves all extremitiesx4.  No facial droop.   EXTREMITIES: pulses normal/equal, full ROM, see photo below. SKIN: warm, color normal, foul smelling wounds noted to lower extremities PSYCH: no abnormalities of mood noted, alert and oriented to situation      ED Course  Procedures  DIAGNOSTIC STUDIES: Oxygen  Saturation is 99% on RA, normal by my interpretation.    COORDINATION OF CARE: 1:02 AM- Pt advised of plan for treatment and pt agrees.   4:44 AM CT scan negative for acute disease Labs pending at this time  5:41 AM CT scan negative Pt is dehydrated He had repeat episodes of vomiting Will admit for wound care of legs, but also rehydration and nausea/vomiting  control Also will need to have f/u CT chest for evaluation of pulmonary nodule This was d/w dr Darrick Meigs for admission  I suspect these wounds are chronic, will need appropriate wound care, but did not start IV antibiotics as do not appear with superimposed infection at this time Pt did have some drops in O2 sat when he was sleeping Otherwise stable in the ED  Labs Review Labs Reviewed  COMPREHENSIVE METABOLIC PANEL - Abnormal; Notable for the following:    Sodium 131 (*)    Chloride 97 (*)    Glucose, Bld 130 (*)    BUN 24 (*)    Creatinine, Ser 1.53 (*)    Calcium 8.8 (*)    Total Protein 9.6 (*)    Albumin 3.1 (*)    ALT 11 (*)    GFR calc non Af Amer 48 (*)    GFR calc Af Amer 56 (*)    All other components within normal limits  CBC WITH DIFFERENTIAL/PLATELET - Abnormal; Notable for the following:    Hemoglobin 11.4 (*)    HCT 36.9 (*)    MCV 69.0 (*)    MCH 21.3 (*)    RDW 17.3 (*)    Platelets 465 (*)    Neutro Abs 8.2 (*)    All other components within normal limits  I-STAT CHEM 8, ED - Abnormal; Notable for the following:    Chloride 99 (*)    BUN 24 (*)    Creatinine, Ser 1.40 (*)    Glucose, Bld 130 (*)    All other components within normal limits  LIPASE, BLOOD  TROPONIN I  URINALYSIS, ROUTINE W REFLEX MICROSCOPIC (NOT AT [facility])    Imaging Review Ct Abdomen Pelvis W Contrast  11/13/2015  CLINICAL DATA:  Non radiating generalized abdominal pain beginning today, vomiting. Shortness of breath. History of hernia repair and cholecystectomy. EXAM: CT ABDOMEN AND PELVIS WITH CONTRAST TECHNIQUE: Multidetector CT imaging of the abdomen and pelvis was performed using the standard protocol following bolus administration of intravenous contrast. CONTRAST:  129m OMNIPAQUE IOHEXOL 300 MG/ML  SOLN COMPARISON:  CT abdomen and pelvis January 12, 2010 FINDINGS: LUNG BASES: New low 18 mm pulmonary nodule LEFT lower lobe, posterior segment. Bandlike probable atelectasis LEFT lower lobe.  Included heart size is normal. No pericardial effusions. SOLID ORGANS: The liver, spleen, pancreas are unremarkable. Stable 16 mm LEFT adrenal nodule associated with thickening. Status post cholecystectomy. GASTROINTESTINAL TRACT: The stomach, small and large bowel are normal in course and caliber without inflammatory changes. Enteric contrast has not yet reached the distal small bowel. Redundant sigmoid colon. Normal appendix. KIDNEYS/ URINARY TRACT: Kidneys are orthotopic, demonstrating symmetric enhancement. No nephrolithiasis, hydronephrosis or solid renal masses. LEFT extra renal pelvis. The unopacified ureters are normal in course and caliber. Delayed imaging through the kidneys demonstrates symmetric prompt contrast excretion within the proximal urinary collecting system. Urinary bladder is partially distended and unremarkable. PERITONEUM/RETROPERITONEUM: Aortoiliac vessels are normal in course and caliber, mild calcific atherosclerosis. Slightly increased in size cyst 1.6 x 6.7 cm nodal conglomeration LEFT external iliac chain, previously measuring up  to 12 mm in transaxial dimension. Multiple RIGHT external iliac chain lymph nodes up to 13 mm short access is similar. Increasing inguinal lymphadenopathy, measuring up to 2.4 cm short axis on the LEFT. Prostate size is normal. No intraperitoneal free fluid nor free air. SOFT TISSUE/OSSEOUS STRUCTURES: Non-suspicious. Stable probable enchondroma LEFT iliac wing. Minimal grade 1 L5-S1 anterolisthesis associated with severe facet arthropathy and severe L5-S1 neural foraminal narrowing. IMPRESSION: No acute intra-abdominal or pelvic process. Enlarging LEFT external iliac nodal conglomeration with increasing inguinal lymphadenopathy. Though findings could be reactive, lymphoproliferative disorder is a concern. New 18 mm LEFT lower lobe pulmonary nodule for which dedicated CT of the chest with contrast is recommended on a nonemergent basis. Electronically Signed    By: Elon Alas M.D.   On: 11/13/2015 03:30   Dg Chest Portable 1 View  11/13/2015  CLINICAL DATA:  Nausea, vomiting and general abdominal pain beginning today. Shortness of breath. Bilateral lower extremity swelling for 1 year. EXAM: PORTABLE CHEST 1 VIEW COMPARISON:  Chest radiograph January 07, 2010 FINDINGS: Cardiomediastinal silhouette is unremarkable for this low inspiratory portable examination with crowded vasculature markings. No pleural effusions or focal consolidations. Bibasilar strandy densities. Trachea projects midline and there is no pneumothorax. Included soft tissue planes and osseous structures are non-suspicious. IMPRESSION: Bibasilar atelectasis in this low inspiratory portable examination. Electronically Signed   By: Elon Alas M.D.   On: 11/13/2015 02:05     Ripley Fraise, MD has personally reviewed and evaluated these images and lab results as part of his medical decision-making.   EKG Interpretation   Date/Time:  Saturday November 13 2015 00:57:47 EST Ventricular Rate:  96 PR Interval:  160 QRS Duration: 106 QT Interval:  373 QTC Calculation: 471 R Axis:   75 Text Interpretation:  Sinus rhythm Borderline low voltage, extremity leads  No significant change since last tracing Confirmed by Christy Gentles  MD, Joshual Terrio  [phone]) on 11/13/2015 1:06:04 AM      MDM   Final diagnoses:  Pulmonary nodule  Abdominal pain, unspecified abdominal location  Nausea and vomiting, vomiting of unspecified type  Dehydration  Wound of lower extremity, left, initial encounter    Nursing notes including past medical history and social history reviewed and considered in documentation xrays/imaging reviewed by myself and considered during evaluation Labs/vital reviewed myself and considered during evaluation   I personally performed the services described in this documentation, which was scribed in my presence. The recorded information has been reviewed and is accurate.         Ripley Fraise, MD 11/13/15 [phone]

## 2015-11-13 NOTE — ED Notes (Signed)
Wrapped left leg with wet to dry bandage. Pt to have wound care

## 2015-11-13 NOTE — H&P (Signed)
PCP:   No PCP Per Patient   Chief Complaint:  Abdominal pain  HPI: 60 year old male with no significant medical history came to the hospital with abdominal pain associated with nausea and vomiting for past 2 days. He says the vomiting is nonbloody, denies any fever or chills. Denies diarrhea. Patient also has chronic left leg wound, which has been getting worse. He does not follow PCP as outpatient and is not getting wound care. He denies chest pain or shortness of breath. No dysuria.  In the ED CT scan of the abdomen pelvis was done which showed no significant abnormality except left and went lymphadenopathy and pulmonary nodule. Labwork revealed acute kidney injury/dehydration.  Allergies:   Allergies  Allergen Reactions  . Nutritional Supplements Hives and Swelling  . Penicillins Swelling     History reviewed. No pertinent past medical history.  Past Surgical History  Procedure Laterality Date  . Cholecystectomy      Prior to Admission medications   Medication Sig Start Date End Date Taking? Authorizing Provider  diphenhydrAMINE (BENADRYL) 25 mg capsule Take 25 mg by mouth every 6 (six) hours as needed. For allergies     Historical Provider, MD    Social History:  reports that he has never smoked. He does not have any smokeless tobacco history on file. He reports that he does not drink alcohol or use illicit drugs.  Family History  Problem Relation Age of Onset  . Diabetes Mother   . Asthma Father     Danley Danker Weights   11/12/15 2251  Weight: 101.606 kg (224 lb)    All the positives are listed in BOLD  Review of Systems:  HEENT: Headache, blurred vision, runny nose, sore throat Neck: Hypothyroidism, hyperthyroidism,,lymphadenopathy Chest : Shortness of breath, history of COPD, Asthma Heart : Chest pain, history of coronary arterey disease GI:  Nausea, vomiting, diarrhea, constipation, GERD GU: Dysuria, urgency, frequency of urination, hematuria Neuro:  Stroke, seizures, syncope Psych: Depression, anxiety, hallucinations   Physical Exam: Blood pressure 114/70, pulse 82, temperature 97 F (36.1 C), temperature source Oral, resp. rate 19, height '5\' 8"'$  (1.727 m), weight 101.606 kg (224 lb), SpO2 100 %. Constitutional:   Patient is a well-developed and well-nourished male in no acute distress and cooperative with exam. Head: Normocephalic and atraumatic Mouth: Mucus membranes moist Eyes: PERRL, EOMI, conjunctivae normal Neck: Supple, No Thyromegaly Cardiovascular: RRR, S1 normal, S2 normal Pulmonary/Chest: CTAB, no wheezes, rales, or rhonchi Abdominal: Soft. Non-tender, non-distended, bowel sounds are normal, no masses, organomegaly, or guarding present.  Neurological: A&O x3, Strength is normal and symmetric bilaterally, cranial nerve II-XII are grossly intact, no focal motor deficit, sensory intact to light touch bilaterally.  Extremities : Chronic leg wound in left lower extremity, 1+ edema, warm to touch    Labs on Admission:  Basic Metabolic Panel:  Recent Labs Lab 11/13/15 0045 11/13/15 0207  NA 131* 136  K 4.5 4.6  CL 97* 99*  CO2 25  --   GLUCOSE 130* 130*  BUN 24* 24*  CREATININE 1.53* 1.40*  CALCIUM 8.8*  --    Liver Function Tests:  Recent Labs Lab 11/13/15 0045  AST 15  ALT 11*  ALKPHOS 81  BILITOT 0.5  PROT 9.6*  ALBUMIN 3.1*    Recent Labs Lab 11/13/15 0045  LIPASE 25   No results for input(s): AMMONIA in the last 168 hours. CBC:  Recent Labs Lab 11/13/15 0045 11/13/15 0207  WBC 10.1  --   NEUTROABS 8.2*  --  HGB 11.4* 14.6  HCT 36.9* 43.0  MCV 69.0*  --   PLT 465*  --    Cardiac Enzymes:  Recent Labs Lab 11/13/15 0346  TROPONINI <0.03     Radiological Exams on Admission: Ct Abdomen Pelvis W Contrast  11/13/2015  CLINICAL DATA:  Non radiating generalized abdominal pain beginning today, vomiting. Shortness of breath. History of hernia repair and cholecystectomy. EXAM: CT  ABDOMEN AND PELVIS WITH CONTRAST TECHNIQUE: Multidetector CT imaging of the abdomen and pelvis was performed using the standard protocol following bolus administration of intravenous contrast. CONTRAST:  180m OMNIPAQUE IOHEXOL 300 MG/ML  SOLN COMPARISON:  CT abdomen and pelvis January 12, 2010 FINDINGS: LUNG BASES: New low 18 mm pulmonary nodule LEFT lower lobe, posterior segment. Bandlike probable atelectasis LEFT lower lobe. Included heart size is normal. No pericardial effusions. SOLID ORGANS: The liver, spleen, pancreas are unremarkable. Stable 16 mm LEFT adrenal nodule associated with thickening. Status post cholecystectomy. GASTROINTESTINAL TRACT: The stomach, small and large bowel are normal in course and caliber without inflammatory changes. Enteric contrast has not yet reached the distal small bowel. Redundant sigmoid colon. Normal appendix. KIDNEYS/ URINARY TRACT: Kidneys are orthotopic, demonstrating symmetric enhancement. No nephrolithiasis, hydronephrosis or solid renal masses. LEFT extra renal pelvis. The unopacified ureters are normal in course and caliber. Delayed imaging through the kidneys demonstrates symmetric prompt contrast excretion within the proximal urinary collecting system. Urinary bladder is partially distended and unremarkable. PERITONEUM/RETROPERITONEUM: Aortoiliac vessels are normal in course and caliber, mild calcific atherosclerosis. Slightly increased in size cyst 1.6 x 6.7 cm nodal conglomeration LEFT external iliac chain, previously measuring up to 12 mm in transaxial dimension. Multiple RIGHT external iliac chain lymph nodes up to 13 mm short access is similar. Increasing inguinal lymphadenopathy, measuring up to 2.4 cm short axis on the LEFT. Prostate size is normal. No intraperitoneal free fluid nor free air. SOFT TISSUE/OSSEOUS STRUCTURES: Non-suspicious. Stable probable enchondroma LEFT iliac wing. Minimal grade 1 L5-S1 anterolisthesis associated with severe facet arthropathy  and severe L5-S1 neural foraminal narrowing. IMPRESSION: No acute intra-abdominal or pelvic process. Enlarging LEFT external iliac nodal conglomeration with increasing inguinal lymphadenopathy. Though findings could be reactive, lymphoproliferative disorder is a concern. New 18 mm LEFT lower lobe pulmonary nodule for which dedicated CT of the chest with contrast is recommended on a nonemergent basis. Electronically Signed   By: CElon AlasM.D.   On: 11/13/2015 03:30   Dg Chest Portable 1 View  11/13/2015  CLINICAL DATA:  Nausea, vomiting and general abdominal pain beginning today. Shortness of breath. Bilateral lower extremity swelling for 1 year. EXAM: PORTABLE CHEST 1 VIEW COMPARISON:  Chest radiograph January 07, 2010 FINDINGS: Cardiomediastinal silhouette is unremarkable for this low inspiratory portable examination with crowded vasculature markings. No pleural effusions or focal consolidations. Bibasilar strandy densities. Trachea projects midline and there is no pneumothorax. Included soft tissue planes and osseous structures are non-suspicious. IMPRESSION: Bibasilar atelectasis in this low inspiratory portable examination. Electronically Signed   By: CElon AlasM.D.   On: 11/13/2015 02:05    EKG: Independently reviewed. Sinus rhythm   Assessment/Plan Active Problems:   Dehydration   AKI (acute kidney injury) ([place]   Wound infection ([place]   pulmonary nodule   Acute kidney injury Patient has acute kidney injury from nausea and vomiting. Will start IV normal saline at 100 mL per hour Follow BMP in a.m.  Nausea and vomiting CT abdomen pelvis negative Likely viral gastroenteritis Continue IV fluids, Zofran when necessary for nausea and  vomiting.  Wound infection Patient has chronic left leg wound, appears infected Will start Keflex per pharmacy Wound care consultation  Pulmonary nodule CT abdomen showed 18 mm left lower lobe pulmonary nodule Patient will need CT chest  as follow-up  DVT prophylaxis Lovenox  Code status: Full code  Family discussion: Admission, patients condition and plan of care including tests being ordered have been discussed with the patient and his sister at bedside who indicate understanding and agree with the plan and Code Status.   Time Spent on Admission: 55 min  [facility] Pager: [phone] 11/13/2015, 6:09 AM  If 7PM-7AM, please contact night-coverage  [email or web address]  Password [email or web address]

## 2015-11-13 NOTE — ED Notes (Signed)
Bilateral edema, stage 2 ulcer to left leg, Per sister pt refused to see a doctor, self care for wounds. Pt has wrapped with dry bandage. Yellow drainage to bandage. Denies pain to legs

## 2015-11-13 NOTE — ED Notes (Signed)
Pt sleeping, 02 dropped to 84%,  Pt placed on 2L, sister at the beside

## 2015-11-13 NOTE — Progress Notes (Signed)
ANTIBIOTIC CONSULT NOTE - INITIAL  Pharmacy Consult for Ancef 1/14 >>  Indication: left leg wound  Allergies  Allergen Reactions  . Nutritional Supplements Hives and Swelling  . Penicillins Swelling    Pt has tolerated cephalosporins (Keflex) in the past   Patient Measurements: Height: '5\' 6"'$  (167.6 cm) Weight: 224 lb 13.9 oz (102 kg) IBW/kg (Calculated) : 63.8  Vital Signs: Temp: 98.6 F (37 C) (01/14 0932) Temp Source: Oral (01/14 0932) BP: 128/82 mmHg (01/14 0932) Pulse Rate: 88 (01/14 0932) Intake/Output from previous day: 01/13 0701 - 01/14 0700 In: 1000 [I.V.:1000] Out: -  Intake/Output from this shift:    Labs:  Recent Labs  11/13/15 0045 11/13/15 0207  WBC 10.1  --   HGB 11.4* 14.6  PLT 465*  --   CREATININE 1.53* 1.40*   Estimated Creatinine Clearance: 63.6 mL/min (by C-G formula based on Cr of 1.4). No results for input(s): VANCOTROUGH, VANCOPEAK, VANCORANDOM, GENTTROUGH, GENTPEAK, GENTRANDOM, TOBRATROUGH, TOBRAPEAK, TOBRARND, AMIKACINPEAK, AMIKACINTROU, AMIKACIN in the last 72 hours.   Microbiology: No results found for this or any previous visit (from the past 720 hour(s)).  Medical History: Past Medical History  Diagnosis Date  . Asthma    Anti-infectives    Start     Dose/Rate Route Frequency Ordered Stop   11/13/15 1100  ceFAZolin (ANCEF) IVPB 1 g/50 mL premix     1 g 100 mL/hr over 30 Minutes Intravenous Every 8 hours 11/13/15 0941       Assessment: 60 year old male with no significant medical history came to the hospital with abdominal pain associated with nausea and vomiting for past 2 days. He says the vomiting is nonbloody, denies any fever or chills. Denies diarrhea. Patient also has chronic left leg wound, which has been getting worse. He does not follow PCP as outpatient and is not getting wound care.  Penicillin allergy noted but per history pt has tolerated cephalosporins (Keflex) in past.  Should be able to tolerate Ancef.   Goal  of Therapy:  Eradicate infection.  Plan:  Ancef 1gm IV q8h Monitor labs, renal fxn, progress and c/s  Hart Robinsons A 11/13/2015,9:42 AM

## 2015-11-13 NOTE — Consult Note (Signed)
WOC wound consult note Reason for Consult: Chronic non-healing wound on the LLE, anterior to lateral aspect. As noted by Dr. Darrick Meigs earlier today, patient does not follow with PCP or outpatient wound care center for this wound. Wound type:Venous insufficiency, infection Pressure Ulcer POA: No Measurement: 12.5cm x 24.6cm x 0.3cm Wound bed: Red, moist with islands of eschar (necrotic tissue) in center and evidence of dried serum at periphery (scab). Viable tissue = 80% of wound bed. Drainage (amount, consistency, odor) Small amount of light yellow exudate exudate Periwound: Edematous (mild, 1+), erythematous, warm to touch.  No induration. Dressing procedure/placement/frequency: Patient is beginning antibiotic therapy today. I will suggest topical wound care consisting of twice daily saline dressings and protecting the periwound tissue with Eucerin cream.  Following application of dressings, the LE will be wrapped from toe to knee with Kerlix roll gauze and topped with an ACE bandage for mild compression wrapped in a similar fashion.  The foot will be placed in a pressure redistribution boot (Prevalon) to both correct position and prevent pressure injury on the ulcer from lateral rotation.  I recommend referral to either a [facility] or an outpatient wound care center of the patient's choosing for continuing wound care that would include compression and follow up by the patient's PCP.  Patient education includes elevation of the LE. [place] team will not follow, but will remain available to this patient, the nursing and medical teams.  Please re-consult if needed. Thanks, Maudie Flakes, MSN, RN, [place], Arther Abbott  Pager# [phone]

## 2015-11-13 NOTE — Progress Notes (Signed)
Patient briefly seen and examined. Chart reviewed. Admitted earlier today by Dr. Darrick Meigs for abdominal pain, nausea vomiting. Believed to be acute viral gastroenteritis, treat symptomatically. Has also developed acute renal failure likely due to GI losses. Continue IV fluids and check renal function in a.m. Continue Keflex for presumed cellulitis related to chronic lower extremity wound. Await wound care consultation.  Domingo Mend, MD [facility] Pager: [phone]

## 2015-11-14 LAB — COMPREHENSIVE METABOLIC PANEL
ALT: 8 U/L — AB (ref 17–63)
AST: 14 U/L — AB (ref 15–41)
Albumin: 2.4 g/dL — ABNORMAL LOW (ref 3.5–5.0)
Alkaline Phosphatase: 70 U/L (ref 38–126)
Anion gap: 3 — ABNORMAL LOW (ref 5–15)
BUN: 11 mg/dL (ref 6–20)
CHLORIDE: 104 mmol/L (ref 101–111)
CO2: 30 mmol/L (ref 22–32)
CREATININE: 1.04 mg/dL (ref 0.61–1.24)
Calcium: 8.4 mg/dL — ABNORMAL LOW (ref 8.9–10.3)
GFR calc non Af Amer: >60 mL/min (ref >60)
Glucose, Bld: 92 mg/dL (ref 65–99)
Potassium: 4.6 mmol/L (ref 3.5–5.1)
SODIUM: 137 mmol/L (ref 135–145)
Total Bilirubin: 0.5 mg/dL (ref 0.3–1.2)
Total Protein: 7.5 g/dL (ref 6.5–8.1)

## 2015-11-14 LAB — CBC
HCT: 34.5 % — ABNORMAL LOW (ref 39.0–52.0)
Hemoglobin: 10.5 g/dL — ABNORMAL LOW (ref 13.0–17.0)
MCH: 21.5 pg — AB (ref 26.0–34.0)
MCHC: 30.4 g/dL (ref 30.0–36.0)
MCV: 70.6 fL — ABNORMAL LOW (ref 78.0–100.0)
PLATELETS: 370 10*3/uL (ref 150–400)
RBC: 4.89 MIL/uL (ref 4.22–5.81)
RDW: 17.4 % — ABNORMAL HIGH (ref 11.5–15.5)
WBC: 6.1 10*3/uL (ref 4.0–10.5)

## 2015-11-14 MED ORDER — CEPHALEXIN 500 MG PO CAPS
500.0000 mg | ORAL_CAPSULE | Freq: Three times a day (TID) | ORAL | Status: DC
Start: 2015-11-14 — End: 2016-06-07

## 2015-11-14 NOTE — Discharge Summary (Signed)
Physician Discharge Summary  James Zamora YTK:[id number] DOB: 12-02-1955 DOA: 11/13/2015  PCP: No PCP Per Patient  Admit date: 11/13/2015 Discharge date: 11/14/2015  Time spent: 45 minutes  Recommendations for Outpatient Follow-up:  -Will be discharged home today. -Advised to follow up with new PCP in 2 weeks.   Discharge Diagnoses:  Active Problems:   Dehydration   AKI (acute kidney injury) ([place])   Wound infection ([place])   Pulmonary nodule   Discharge Condition: Stable and improved  Filed Weights   11/12/15 2251 11/13/15 0932  Weight: 101.606 kg (224 lb) 102 kg (224 lb 13.9 oz)    History of present illness:  As per Dr. Darrick Meigs 25/3: 60 year old male with no significant medical history came to the hospital with abdominal pain associated with nausea and vomiting for past 2 days. He says the vomiting is nonbloody, denies any fever or chills. Denies diarrhea. Patient also has chronic left leg wound, which has been getting worse. He does not follow PCP as outpatient and is not getting wound care. He denies chest pain or shortness of breath. No dysuria.  In the ED CT scan of the abdomen pelvis was done which showed no significant abnormality except left and went lymphadenopathy and pulmonary nodule. Labwork revealed acute kidney injury/dehydration.  Hospital Course:   N/V -Resolved. -Likely acute viral gastroenteritis.  ARF -Resolved with IVF.  LLE Cellulitis and wound -Continue keflex for 5 more days. -Will arrange [facility] to assist with dressing changes.  Procedures:  None   Consultations:  None  Discharge Instructions  Discharge Instructions    Increase activity slowly    Complete by:  As directed             Medication List    TAKE these medications        acetaminophen 500 MG tablet  Commonly known as:  TYLENOL  Take 1,000 mg by mouth every 6 (six) hours as needed for mild pain or headache.     cephALEXin 500 MG capsule  Commonly known as:   KEFLEX  Take 1 capsule (500 mg total) by mouth 3 (three) times daily.       Allergies  Allergen Reactions  . Nutritional Supplements Hives and Swelling  . Penicillins Swelling    Pt has tolerated cephalosporins (Keflex) in the past       Follow-up Information    Schedule an appointment as soon as possible for a visit in 2 weeks to follow up.   Why:  with new primary care provider       The results of significant diagnostics from this hospitalization (including imaging, microbiology, ancillary and laboratory) are listed below for reference.    Significant Diagnostic Studies: Ct Abdomen Pelvis W Contrast  11/13/2015  CLINICAL DATA:  Non radiating generalized abdominal pain beginning today, vomiting. Shortness of breath. History of hernia repair and cholecystectomy. EXAM: CT ABDOMEN AND PELVIS WITH CONTRAST TECHNIQUE: Multidetector CT imaging of the abdomen and pelvis was performed using the standard protocol following bolus administration of intravenous contrast. CONTRAST:  157m OMNIPAQUE IOHEXOL 300 MG/ML  SOLN COMPARISON:  CT abdomen and pelvis January 12, 2010 FINDINGS: LUNG BASES: New low 18 mm pulmonary nodule LEFT lower lobe, posterior segment. Bandlike probable atelectasis LEFT lower lobe. Included heart size is normal. No pericardial effusions. SOLID ORGANS: The liver, spleen, pancreas are unremarkable. Stable 16 mm LEFT adrenal nodule associated with thickening. Status post cholecystectomy. GASTROINTESTINAL TRACT: The stomach, small and large bowel are normal in course  and caliber without inflammatory changes. Enteric contrast has not yet reached the distal small bowel. Redundant sigmoid colon. Normal appendix. KIDNEYS/ URINARY TRACT: Kidneys are orthotopic, demonstrating symmetric enhancement. No nephrolithiasis, hydronephrosis or solid renal masses. LEFT extra renal pelvis. The unopacified ureters are normal in course and caliber. Delayed imaging through the kidneys demonstrates  symmetric prompt contrast excretion within the proximal urinary collecting system. Urinary bladder is partially distended and unremarkable. PERITONEUM/RETROPERITONEUM: Aortoiliac vessels are normal in course and caliber, mild calcific atherosclerosis. Slightly increased in size cyst 1.6 x 6.7 cm nodal conglomeration LEFT external iliac chain, previously measuring up to 12 mm in transaxial dimension. Multiple RIGHT external iliac chain lymph nodes up to 13 mm short access is similar. Increasing inguinal lymphadenopathy, measuring up to 2.4 cm short axis on the LEFT. Prostate size is normal. No intraperitoneal free fluid nor free air. SOFT TISSUE/OSSEOUS STRUCTURES: Non-suspicious. Stable probable enchondroma LEFT iliac wing. Minimal grade 1 L5-S1 anterolisthesis associated with severe facet arthropathy and severe L5-S1 neural foraminal narrowing. IMPRESSION: No acute intra-abdominal or pelvic process. Enlarging LEFT external iliac nodal conglomeration with increasing inguinal lymphadenopathy. Though findings could be reactive, lymphoproliferative disorder is a concern. New 18 mm LEFT lower lobe pulmonary nodule for which dedicated CT of the chest with contrast is recommended on a nonemergent basis. Electronically Signed   By: Elon Alas M.D.   On: 11/13/2015 03:30   Dg Chest Portable 1 View  11/13/2015  CLINICAL DATA:  Nausea, vomiting and general abdominal pain beginning today. Shortness of breath. Bilateral lower extremity swelling for 1 year. EXAM: PORTABLE CHEST 1 VIEW COMPARISON:  Chest radiograph January 07, 2010 FINDINGS: Cardiomediastinal silhouette is unremarkable for this low inspiratory portable examination with crowded vasculature markings. No pleural effusions or focal consolidations. Bibasilar strandy densities. Trachea projects midline and there is no pneumothorax. Included soft tissue planes and osseous structures are non-suspicious. IMPRESSION: Bibasilar atelectasis in this low inspiratory  portable examination. Electronically Signed   By: Elon Alas M.D.   On: 11/13/2015 02:05    Microbiology: No results found for this or any previous visit (from the past 240 hour(s)).   Labs: Basic Metabolic Panel:  Recent Labs Lab 11/13/15 0045 11/13/15 0207 11/14/15 0748  NA 131* 136 137  K 4.5 4.6 4.6  CL 97* 99* 104  CO2 25  --  30  GLUCOSE 130* 130* 92  BUN 24* 24* 11  CREATININE 1.53* 1.40* 1.04  CALCIUM 8.8*  --  8.4*   Liver Function Tests:  Recent Labs Lab 11/13/15 0045 11/14/15 0748  AST 15 14*  ALT 11* 8*  ALKPHOS 81 70  BILITOT 0.5 0.5  PROT 9.6* 7.5  ALBUMIN 3.1* 2.4*    Recent Labs Lab 11/13/15 0045  LIPASE 25   No results for input(s): AMMONIA in the last 168 hours. CBC:  Recent Labs Lab 11/13/15 0045 11/13/15 0207 11/14/15 0748  WBC 10.1  --  6.1  NEUTROABS 8.2*  --   --   HGB 11.4* 14.6 10.5*  HCT 36.9* 43.0 34.5*  MCV 69.0*  --  70.6*  PLT 465*  --  370   Cardiac Enzymes:  Recent Labs Lab 11/13/15 0346  TROPONINI <0.03   BNP: BNP (last 3 results) No results for input(s): BNP in the last 8760 hours.  ProBNP (last 3 results) No results for input(s): PROBNP in the last 8760 hours.  CBG: No results for input(s): GLUCAP in the last 168 hours.     Signed:  Lelon Frohlich  [facility] Pager: [phone] 11/14/2015, 12:44 PM

## 2015-12-07 DIAGNOSIS — Z125 Encounter for screening for malignant neoplasm of prostate: Secondary | ICD-10-CM | POA: Diagnosis not present

## 2015-12-07 DIAGNOSIS — R0602 Shortness of breath: Secondary | ICD-10-CM | POA: Diagnosis not present

## 2015-12-07 DIAGNOSIS — I10 Essential (primary) hypertension: Secondary | ICD-10-CM | POA: Diagnosis not present

## 2015-12-07 DIAGNOSIS — E669 Obesity, unspecified: Secondary | ICD-10-CM | POA: Diagnosis not present

## 2015-12-07 DIAGNOSIS — L97104 Non-pressure chronic ulcer of unspecified thigh with necrosis of bone: Secondary | ICD-10-CM | POA: Diagnosis not present

## 2016-06-07 ENCOUNTER — Emergency Department (HOSPITAL_COMMUNITY): Payer: Medicare Other

## 2016-06-07 ENCOUNTER — Encounter (HOSPITAL_COMMUNITY): Payer: Self-pay

## 2016-06-07 ENCOUNTER — Inpatient Hospital Stay (HOSPITAL_COMMUNITY)
Admission: EM | Admit: 2016-06-07 | Discharge: 2016-06-12 | DRG: 293 | Disposition: A | Discharge status: Home Health | Payer: Medicare Other | Attending: Internal Medicine | Admitting: Internal Medicine

## 2016-06-07 ENCOUNTER — Inpatient Hospital Stay (HOSPITAL_COMMUNITY): Payer: Medicare Other

## 2016-06-07 DIAGNOSIS — I509 Heart failure, unspecified: Secondary | ICD-10-CM

## 2016-06-07 DIAGNOSIS — J45909 Unspecified asthma, uncomplicated: Secondary | ICD-10-CM | POA: Diagnosis present

## 2016-06-07 DIAGNOSIS — R0602 Shortness of breath: Secondary | ICD-10-CM | POA: Diagnosis not present

## 2016-06-07 DIAGNOSIS — Z825 Family history of asthma and other chronic lower respiratory diseases: Secondary | ICD-10-CM

## 2016-06-07 DIAGNOSIS — R0902 Hypoxemia: Secondary | ICD-10-CM | POA: Diagnosis present

## 2016-06-07 DIAGNOSIS — R1084 Generalized abdominal pain: Secondary | ICD-10-CM | POA: Diagnosis present

## 2016-06-07 DIAGNOSIS — E538 Deficiency of other specified B group vitamins: Secondary | ICD-10-CM | POA: Diagnosis present

## 2016-06-07 DIAGNOSIS — Z9049 Acquired absence of other specified parts of digestive tract: Secondary | ICD-10-CM

## 2016-06-07 DIAGNOSIS — Z9119 Patient's noncompliance with other medical treatment and regimen: Secondary | ICD-10-CM

## 2016-06-07 DIAGNOSIS — I5031 Acute diastolic (congestive) heart failure: Secondary | ICD-10-CM | POA: Diagnosis not present

## 2016-06-07 DIAGNOSIS — R590 Localized enlarged lymph nodes: Secondary | ICD-10-CM | POA: Diagnosis present

## 2016-06-07 DIAGNOSIS — M7989 Other specified soft tissue disorders: Secondary | ICD-10-CM | POA: Diagnosis not present

## 2016-06-07 DIAGNOSIS — K529 Noninfective gastroenteritis and colitis, unspecified: Secondary | ICD-10-CM | POA: Diagnosis present

## 2016-06-07 DIAGNOSIS — Z833 Family history of diabetes mellitus: Secondary | ICD-10-CM

## 2016-06-07 DIAGNOSIS — R1110 Vomiting, unspecified: Secondary | ICD-10-CM | POA: Diagnosis not present

## 2016-06-07 DIAGNOSIS — D509 Iron deficiency anemia, unspecified: Secondary | ICD-10-CM | POA: Diagnosis not present

## 2016-06-07 DIAGNOSIS — R079 Chest pain, unspecified: Secondary | ICD-10-CM | POA: Diagnosis not present

## 2016-06-07 DIAGNOSIS — R112 Nausea with vomiting, unspecified: Secondary | ICD-10-CM | POA: Diagnosis not present

## 2016-06-07 DIAGNOSIS — R609 Edema, unspecified: Secondary | ICD-10-CM

## 2016-06-07 DIAGNOSIS — I110 Hypertensive heart disease with heart failure: Principal | ICD-10-CM | POA: Diagnosis present

## 2016-06-07 DIAGNOSIS — D649 Anemia, unspecified: Secondary | ICD-10-CM | POA: Diagnosis present

## 2016-06-07 HISTORY — DX: Perforation of intestine (nontraumatic): K63.1

## 2016-06-07 LAB — CBC WITH DIFFERENTIAL/PLATELET
BASOS PCT: 1 %
Basophils Absolute: 0 10*3/uL (ref 0.0–0.1)
Eosinophils Absolute: 0.1 10*3/uL (ref 0.0–0.7)
Eosinophils Relative: 2 %
HCT: 37.6 % — ABNORMAL LOW (ref 39.0–52.0)
HEMOGLOBIN: 10.9 g/dL — AB (ref 13.0–17.0)
LYMPHS ABS: 1 10*3/uL (ref 0.7–4.0)
LYMPHS PCT: 16 %
MCH: 19.5 pg — AB (ref 26.0–34.0)
MCHC: 29 g/dL — ABNORMAL LOW (ref 30.0–36.0)
MCV: 67.1 fL — AB (ref 78.0–100.0)
MONOS PCT: 5 %
Monocytes Absolute: 0.3 10*3/uL (ref 0.1–1.0)
NEUTROS ABS: 4.6 10*3/uL (ref 1.7–7.7)
NEUTROS PCT: 76 %
Platelets: 366 10*3/uL (ref 150–400)
RBC: 5.6 MIL/uL (ref 4.22–5.81)
RDW: 19.2 % — ABNORMAL HIGH (ref 11.5–15.5)
WBC: 6 10*3/uL (ref 4.0–10.5)

## 2016-06-07 LAB — FERRITIN: FERRITIN: 37 ng/mL (ref 24–336)

## 2016-06-07 LAB — TROPONIN I

## 2016-06-07 LAB — URINALYSIS, ROUTINE W REFLEX MICROSCOPIC
Bilirubin Urine: NEGATIVE
Glucose, UA: NEGATIVE mg/dL
Ketones, ur: NEGATIVE mg/dL
LEUKOCYTES UA: NEGATIVE
Nitrite: NEGATIVE
PROTEIN: NEGATIVE mg/dL
Specific Gravity, Urine: 1.01 (ref 1.005–1.030)
pH: 6.5 (ref 5.0–8.0)

## 2016-06-07 LAB — CBC
HCT: 37.5 % — ABNORMAL LOW (ref 39.0–52.0)
Hemoglobin: 10.9 g/dL — ABNORMAL LOW (ref 13.0–17.0)
MCH: 19.6 pg — ABNORMAL LOW (ref 26.0–34.0)
MCHC: 29.1 g/dL — ABNORMAL LOW (ref 30.0–36.0)
MCV: 67.4 fL — ABNORMAL LOW (ref 78.0–100.0)
PLATELETS: 355 10*3/uL (ref 150–400)
RBC: 5.56 MIL/uL (ref 4.22–5.81)
RDW: 19.2 % — AB (ref 11.5–15.5)
WBC: 7.4 10*3/uL (ref 4.0–10.5)

## 2016-06-07 LAB — CREATININE, SERUM: CREATININE: 1.08 mg/dL (ref 0.61–1.24)

## 2016-06-07 LAB — LIPASE, BLOOD: Lipase: 18 U/L (ref 11–51)

## 2016-06-07 LAB — COMPREHENSIVE METABOLIC PANEL
ALT: 8 U/L — ABNORMAL LOW (ref 17–63)
ANION GAP: 5 (ref 5–15)
AST: 13 U/L — ABNORMAL LOW (ref 15–41)
Albumin: 3.3 g/dL — ABNORMAL LOW (ref 3.5–5.0)
Alkaline Phosphatase: 77 U/L (ref 38–126)
BUN: 8 mg/dL (ref 6–20)
CHLORIDE: 101 mmol/L (ref 101–111)
CO2: 29 mmol/L (ref 22–32)
Calcium: 8.3 mg/dL — ABNORMAL LOW (ref 8.9–10.3)
Creatinine, Ser: 1.12 mg/dL (ref 0.61–1.24)
GFR calc non Af Amer: >60 mL/min (ref >60)
Glucose, Bld: 132 mg/dL — ABNORMAL HIGH (ref 65–99)
POTASSIUM: 3.7 mmol/L (ref 3.5–5.1)
SODIUM: 135 mmol/L (ref 135–145)
Total Bilirubin: 0.3 mg/dL (ref 0.3–1.2)
Total Protein: 8.7 g/dL — ABNORMAL HIGH (ref 6.5–8.1)

## 2016-06-07 LAB — URINE MICROSCOPIC-ADD ON
BACTERIA UA: NONE SEEN
WBC UA: NONE SEEN WBC/hpf (ref 0–5)

## 2016-06-07 LAB — RETICULOCYTES
RBC.: 5.56 MIL/uL (ref 4.22–5.81)
RETIC CT PCT: 0.9 % (ref 0.4–3.1)
Retic Count, Absolute: 50 10*3/uL (ref 19.0–186.0)

## 2016-06-07 LAB — IRON AND TIBC
Iron: 18 ug/dL — ABNORMAL LOW (ref 45–182)
Saturation Ratios: 4 % — ABNORMAL LOW (ref 17.9–39.5)
TIBC: 454 ug/dL — AB (ref 250–450)
UIBC: 436 ug/dL

## 2016-06-07 LAB — FOLATE: Folate: 6.7 ng/mL (ref >5.9)

## 2016-06-07 LAB — BRAIN NATRIURETIC PEPTIDE: B NATRIURETIC PEPTIDE 5: 14 pg/mL (ref 0.0–100.0)

## 2016-06-07 LAB — VITAMIN B12: Vitamin B-12: 146 pg/mL — ABNORMAL LOW (ref 180–914)

## 2016-06-07 LAB — MRSA PCR SCREENING: MRSA by PCR: NEGATIVE

## 2016-06-07 MED ORDER — FUROSEMIDE 10 MG/ML IJ SOLN
40.0000 mg | Freq: Once | INTRAMUSCULAR | Status: AC
  Administered 2016-06-07: 40 mg via INTRAVENOUS
  Filled 2016-06-07: qty 4

## 2016-06-07 MED ORDER — FENTANYL CITRATE (PF) 100 MCG/2ML IJ SOLN
100.0000 ug | Freq: Once | INTRAMUSCULAR | Status: AC
  Administered 2016-06-07: 100 ug via INTRAVENOUS
  Filled 2016-06-07: qty 2

## 2016-06-07 MED ORDER — IOPAMIDOL (ISOVUE-300) INJECTION 61%
100.0000 mL | Freq: Once | INTRAVENOUS | Status: AC | PRN
  Administered 2016-06-07: 100 mL via INTRAVENOUS

## 2016-06-07 MED ORDER — ONDANSETRON HCL 4 MG/2ML IJ SOLN
4.0000 mg | Freq: Four times a day (QID) | INTRAMUSCULAR | Status: DC | PRN
  Administered 2016-06-07: 4 mg via INTRAVENOUS
  Filled 2016-06-07 (×3): qty 2

## 2016-06-07 MED ORDER — ONDANSETRON HCL 4 MG/2ML IJ SOLN
4.0000 mg | Freq: Once | INTRAMUSCULAR | Status: AC
  Administered 2016-06-07: 4 mg via INTRAVENOUS

## 2016-06-07 MED ORDER — LISINOPRIL 5 MG PO TABS
5.0000 mg | ORAL_TABLET | Freq: Every day | ORAL | Status: DC
  Administered 2016-06-07 – 2016-06-12 (×6): 5 mg via ORAL
  Filled 2016-06-07 (×6): qty 1

## 2016-06-07 MED ORDER — ASPIRIN EC 81 MG PO TBEC
81.0000 mg | DELAYED_RELEASE_TABLET | Freq: Every day | ORAL | Status: DC
  Administered 2016-06-07 – 2016-06-12 (×6): 81 mg via ORAL
  Filled 2016-06-07 (×6): qty 1

## 2016-06-07 MED ORDER — HYDRALAZINE HCL 20 MG/ML IJ SOLN
10.0000 mg | INTRAMUSCULAR | Status: DC | PRN
  Administered 2016-06-07: 10 mg via INTRAVENOUS
  Filled 2016-06-07: qty 1

## 2016-06-07 MED ORDER — HEPARIN SODIUM (PORCINE) 5000 UNIT/ML IJ SOLN
5000.0000 [IU] | Freq: Three times a day (TID) | INTRAMUSCULAR | Status: DC
  Administered 2016-06-07 – 2016-06-12 (×16): 5000 [IU] via SUBCUTANEOUS
  Filled 2016-06-07 (×16): qty 1

## 2016-06-07 MED ORDER — ONDANSETRON HCL 4 MG/2ML IJ SOLN
INTRAMUSCULAR | Status: AC
  Filled 2016-06-07: qty 2

## 2016-06-07 MED ORDER — FENTANYL CITRATE (PF) 100 MCG/2ML IJ SOLN
50.0000 ug | Freq: Once | INTRAMUSCULAR | Status: AC
  Administered 2016-06-07: 50 ug via INTRAVENOUS
  Filled 2016-06-07: qty 2

## 2016-06-07 MED ORDER — DIATRIZOATE MEGLUMINE & SODIUM 66-10 % PO SOLN
ORAL | Status: AC
  Filled 2016-06-07: qty 30

## 2016-06-07 MED ORDER — SODIUM CHLORIDE 0.9 % IV SOLN: 250.0000 mL | INTRAVENOUS | Status: DC | PRN

## 2016-06-07 MED ORDER — ACETAMINOPHEN 325 MG PO TABS: 650.0000 mg | ORAL_TABLET | ORAL | Status: DC | PRN

## 2016-06-07 MED ORDER — FUROSEMIDE 10 MG/ML IJ SOLN
40.0000 mg | Freq: Two times a day (BID) | INTRAMUSCULAR | Status: DC
  Administered 2016-06-07 – 2016-06-08 (×3): 40 mg via INTRAVENOUS
  Filled 2016-06-07 (×3): qty 4

## 2016-06-07 MED ORDER — ONDANSETRON HCL 4 MG/2ML IJ SOLN
4.0000 mg | Freq: Once | INTRAMUSCULAR | Status: AC
  Administered 2016-06-07: 4 mg via INTRAVENOUS
  Filled 2016-06-07: qty 2

## 2016-06-07 MED ORDER — MORPHINE SULFATE (PF) 2 MG/ML IV SOLN
2.0000 mg | INTRAVENOUS | Status: DC | PRN
  Administered 2016-06-07 – 2016-06-08 (×6): 2 mg via INTRAVENOUS
  Filled 2016-06-07 (×6): qty 1

## 2016-06-07 MED ORDER — ONDANSETRON HCL 4 MG/2ML IJ SOLN
4.0000 mg | Freq: Four times a day (QID) | INTRAMUSCULAR | Status: DC | PRN
  Administered 2016-06-07 – 2016-06-08 (×2): 4 mg via INTRAVENOUS

## 2016-06-07 MED ORDER — CARVEDILOL 3.125 MG PO TABS
6.2500 mg | ORAL_TABLET | Freq: Two times a day (BID) | ORAL | Status: DC
  Administered 2016-06-08 – 2016-06-12 (×8): 6.25 mg via ORAL
  Filled 2016-06-07 (×9): qty 2

## 2016-06-07 MED ORDER — SODIUM CHLORIDE 0.9% FLUSH: 3.0000 mL | INTRAVENOUS | Status: DC | PRN

## 2016-06-07 MED ORDER — SODIUM CHLORIDE 0.9% FLUSH
3.0000 mL | Freq: Two times a day (BID) | INTRAVENOUS | Status: DC
  Administered 2016-06-07 – 2016-06-12 (×11): 3 mL via INTRAVENOUS

## 2016-06-07 MED ORDER — NITROGLYCERIN 2 % TD OINT
1.0000 [in_us] | TOPICAL_OINTMENT | Freq: Four times a day (QID) | TRANSDERMAL | Status: DC
  Administered 2016-06-07: 1 [in_us] via TOPICAL
  Filled 2016-06-07: qty 1

## 2016-06-07 NOTE — ED Notes (Signed)
EKG done and given to EDP

## 2016-06-07 NOTE — H&P (Signed)
History and Physical    James Zamora AST:[id number] DOB: 05-13-1956 DOA: 06/07/2016  PCP: Maggie Font, MD   Patient coming from: Home   Chief Complaint: Emesis  HPI: James Zamora is a 60 y.o. male who is not on any chronic medications comes to the ED today with complaints of  intermittent emesis for 6 days after eating out at a [employer]. Patient states he has sharp abdominal pain in the periumbilical area which has been intermittent. He has been unable to tolerate anything by mouth. He reports BMs are normal, with his last bowel movement being yesterday. No reported abdominal distention. No hematemesis or coffee ground emesis.  Patient has also been SOB. Family report he has had SOB for quite some time now. He has questionable history of asthma. He has not had any chest pain. He has worsening LE edema. On his left LE, it was noted that he has a large wound. This appears to be chronic and was present on 10/2015. Family reports it initially improved with wound care. But has since reoccurred due to patient noncompliance. No reported fevers.   ED Course: Pt was noted to be hypertensive on arrival. CXR revealed cardiomegaly with diffuse pulmonary vascular congestion likely due to CHF. CT scan of abd/pelvis was unremarkable.  LFTs/Lipase was unrevealing.He has been referred for admission for further treatment of CHF.   Review of Systems: As per HPI otherwise 10 point review of systems negative.    Past Medical History:  Diagnosis Date  . Asthma   . Perforated bowel [facility])     Past Surgical History:  Procedure Laterality Date  . CHOLECYSTECTOMY       reports that he has never smoked. He has quit using smokeless tobacco. He reports that he does not drink alcohol or use drugs.  Allergies  Allergen Reactions  . Nutritional Supplements Hives and Swelling  . Penicillins Swelling    Pt has tolerated cephalosporins (Keflex) in the past    Family History  Problem Relation Age of  Onset  . Diabetes Mother   . Asthma Father     Prior to Admission medications   Medication Sig Start Date End Date Taking? Authorizing Provider  acetaminophen (TYLENOL) 500 MG tablet Take 1,000 mg by mouth every 6 (six) hours as needed for mild pain or headache.    Historical Provider, MD  cephALEXin (KEFLEX) 500 MG capsule Take 1 capsule (500 mg total) by mouth 3 (three) times daily. 11/14/15   Erline Hau, MD    Physical Exam: Vitals:   06/07/16 0730 06/07/16 0745 06/07/16 0800 06/07/16 0830  BP: 152/81  153/83 143/84  Pulse: 98 87  78  Resp: '21 19 17 19  '$ Temp:      SpO2: 99% 100%  100%  Weight:      Height:          Constitutional: Patient appears uncomfortable due to pain.  Vitals:   06/07/16 0730 06/07/16 0745 06/07/16 0800 06/07/16 0830  BP: 152/81  153/83 143/84  Pulse: 98 87  78  Resp: '21 19 17 19  '$ Temp:      SpO2: 99% 100%  100%  Weight:      Height:       Eyes: PERRL, lids and conjunctivae normal ENMT: Mucous membranes are moist. Posterior pharynx clear of any exudate or lesions.Normal dentition.  Neck: normal, supple, no masses, no thyromegaly Respiratory: Crackles bilaterally whit increased respiratory effort. No accessory muscle use.  Cardiovascular: 2+  extremity edema. Regular rate and rhythm, no murmurs / rubs / gallops.  2+ pedal pulses. No carotid bruits.  Abdomen: Periumbilical tenderness, no masses palpated. No hepatosplenomegaly. Bowel sounds positive.  Musculoskeletal: no clubbing / cyanosis. No joint deformity upper and lower extremities. Good ROM, no contractures. Normal muscle tone.  Skin: Large wound over anterior aspect of Left lower extremity. No purulent discharge or Foul smell noted.  Neurologic: CN 2-12 grossly intact. Sensation intact, DTR normal. Strength 5/5 in all 4.  Psychiatric: Normal judgment and insight. Alert and oriented x 3. Normal mood.     Labs on Admission: I have personally reviewed following labs and imaging  studies  CBC:  Recent Labs Lab 06/07/16 0415  WBC 6.0  NEUTROABS 4.6  HGB 10.9*  HCT 37.6*  MCV 67.1*  PLT 751   Basic Metabolic Panel:  Recent Labs Lab 06/07/16 0415  NA 135  K 3.7  CL 101  CO2 29  GLUCOSE 132*  BUN 8  CREATININE 1.12  CALCIUM 8.3*   GFR: Estimated Creatinine Clearance: 79.9 mL/min (by C-G formula based on SCr of 1.12 mg/dL). Liver Function Tests:  Recent Labs Lab 06/07/16 0415  AST 13*  ALT 8*  ALKPHOS 77  BILITOT 0.3  PROT 8.7*  ALBUMIN 3.3*    Recent Labs Lab 06/07/16 0415  LIPASE 18   No results for input(s): AMMONIA in the last 168 hours. Coagulation Profile: No results for input(s): INR, PROTIME in the last 168 hours. Cardiac Enzymes:  Recent Labs Lab 06/07/16 0415  TROPONINI <0.03   BNP (last 3 results) No results for input(s): PROBNP in the last 8760 hours. HbA1C: No results for input(s): HGBA1C in the last 72 hours. CBG: No results for input(s): GLUCAP in the last 168 hours. Lipid Profile: No results for input(s): CHOL, HDL, LDLCALC, TRIG, CHOLHDL, LDLDIRECT in the last 72 hours. Thyroid Function Tests: No results for input(s): TSH, T4TOTAL, FREET4, T3FREE, THYROIDAB in the last 72 hours. Anemia Panel: No results for input(s): VITAMINB12, FOLATE, FERRITIN, TIBC, IRON, RETICCTPCT in the last 72 hours. Urine analysis:    Component Value Date/Time   COLORURINE YELLOW 06/07/2016 0730   APPEARANCEUR CLEAR 06/07/2016 0730   LABSPEC 1.010 06/07/2016 0730   PHURINE 6.5 06/07/2016 0730   GLUCOSEU NEGATIVE 06/07/2016 0730   HGBUR TRACE (A) 06/07/2016 0730   BILIRUBINUR NEGATIVE 06/07/2016 0730   KETONESUR NEGATIVE 06/07/2016 0730   PROTEINUR NEGATIVE 06/07/2016 0730   UROBILINOGEN 0.2 01/16/2010 1955   NITRITE NEGATIVE 06/07/2016 0730   LEUKOCYTESUR NEGATIVE 06/07/2016 0730   Sepsis Labs: !!!!!!!!!!!!!!!!!!!!!!!!!!!!!!!!!!!!!!!!!!!! '@LABRCNTIP'$ (procalcitonin:4,lacticidven:4) )No results found for this or any  previous visit (from the past 240 hour(s)).   Radiological Exams on Admission: Ct Abdomen Pelvis W Contrast  Result Date: 06/07/2016 CLINICAL DATA:  Acute onset of intermittent vomiting. Initial encounter. EXAM: CT ABDOMEN AND PELVIS WITH CONTRAST TECHNIQUE: Multidetector CT imaging of the abdomen and pelvis was performed using the standard protocol following bolus administration of intravenous contrast. CONTRAST:  174m ISOVUE-300 IOPAMIDOL (ISOVUE-300) INJECTION 61% COMPARISON:  CT of the abdomen and pelvis performed 11/13/2015 FINDINGS: Minimal bibasilar atelectasis or scarring is noted. The liver and spleen are unremarkable in appearance. The patient is status post cholecystectomy, with clips noted at the gallbladder fossa. A 2.2 cm nodule is noted at the left adrenal gland, relatively stable from 2011 and likely benign. The pancreas and right adrenal gland are unremarkable. The kidneys are unremarkable in appearance. Mild prominence of the left renal pelvis is stable in appearance  and reflects the patient's baseline. There is no evidence of hydronephrosis. No renal or ureteral stones are seen. No perinephric stranding is appreciated. No free fluid is identified. The small bowel is unremarkable in appearance. The stomach is within normal limits. No acute vascular abnormalities are seen. Minimal calcification is seen along the distal abdominal aorta and its branches. The appendix is not well characterized; there is no evidence of appendicitis. The colon is grossly unremarkable in appearance. The bladder is mildly distended. Mild soft tissue inflammation is noted about the bladder, concerning for cystitis. The prostate remains normal in size. No inguinal lymphadenopathy is seen. No acute osseous abnormalities are identified. IMPRESSION: 1. Mild soft tissue inflammation about the bladder raises concern for cystitis. 2. 2.2 cm left adrenal nodule is relatively stable from 2011 and likely benign. Electronically  Signed   By: Garald Balding M.D.   On: 06/07/2016 06:55   Dg Chest Port 1 View  Result Date: 06/07/2016 CLINICAL DATA:  Initial evaluation for acute chest pain. EXAM: PORTABLE CHEST 1 VIEW COMPARISON:  Prior radiograph from 11/13/2015. FINDINGS: Cardiomegaly stable from prior. Mediastinal silhouette within normal limits. Lungs are hypoinflated. Diffuse pulmonary vascular congestion with indistinctness of the interstitial markings, suggestive of mild diffuse pulmonary edema. No definite pleural effusion. No focal infiltrates identified. No pneumothorax. No acute osseous abnormality. IMPRESSION: Cardiomegaly with diffuse pulmonary vascular congestion and indistinctness of the interstitial markings, suggestive of mild diffuse pulmonary edema. Electronically Signed   By: Jeannine Boga M.D.   On: 06/07/2016 05:53    EKG: Independently reviewed. T waves inversions noted in V2 and V3.  Assessment/Plan Active Problems:   CHF (congestive heart failure) ([place])  1. Acute CHF. CXR revealed cardiomegaly with diffuse pulmonary vascular congestion.Will check ECHO to assess EF. Start on IV Lasix. Will start on beta blockers and low-dose ACEi. Since he does have EKG changes, will cycle cardiac markers.  2. Nausea/vomitting likely related to gastroenteritis. Ct of abd/pelvis was unrevealing. Liver function tests were normal, treat supportively for now. Keep NPO until symptoms have improved. He is s/p cholecystectomy 3. HTN. He will be started on beta blockers and ACEi. .  4. Chronic left LE wound. Wound care consulted. Does not appear to be actively infected will check venous droppers to r/o DVT  5. Microcytic anemia. Will check anemia panel. No evidence of bleeding at this time. Check stool for occult blood.        DVT prophylaxis: SCDs Code Status: Full Family Communication: Family bedside Disposition Plan: Discharge home once improved Consults called: Wound care Admission status:  Inpatient   Kathie Dike, MD [facility] If 7PM-7AM, please contact night-coverage [email or web address] Password [email or web address]  06/07/2016, 9:09 AM   By signing my name below, I, Collene Leyden, attest that this documentation has been prepared under the direction and in the presence of Kathie Dike, MD. Electronically signed: Collene Leyden, Scribe. 06/07/16 10:10 AM  I, Dr. Kathie Dike, personally performed the services described in this documentaiton. All medical record entries made by the scribe were at my direction and in my presence. I have reviewed the chart and agree that the record reflects my personal performance and is accurate and complete  Kathie Dike, MD, 06/07/2016 10:53 AM

## 2016-06-07 NOTE — ED Notes (Signed)
Pt back from CT

## 2016-06-07 NOTE — ED Notes (Signed)
Pt gone over for CT 

## 2016-06-07 NOTE — ED Notes (Signed)
Pt has non-healing ulcer to left lower leg; pt's family member states pt has had the ulcer for the last 2-3 years; the ulcer involves the total circumference of the leg

## 2016-06-07 NOTE — ED Triage Notes (Signed)
Patient states emesis off and on X1 week. This morning patient has had three episodes of vomiting. Patient states he has had normal bowel movements.

## 2016-06-07 NOTE — ED Provider Notes (Signed)
[facility] DEPT Provider Note   CSN: [id number] Arrival date & time: 06/07/16  0351  First Provider Contact:  First MD Initiated Contact with Patient 06/07/16 [phone]      Patient gave verbal permission to utilize photo for medical documentation only The image was not stored on any personal device   History   Chief Complaint Chief Complaint  Patient presents with  . Emesis    HPI James Zamora is a 60 y.o. male.  The history is provided by the patient.  Emesis   This is a new problem. The current episode started more than 2 days ago. The problem has been rapidly worsening. Associated symptoms include abdominal pain. Pertinent negatives include no fever. Risk factors include suspect food intake.  Patient presents with abdominal pain and vomiting Apparently he ate out at a restaurant last week and since that time he has had intermittent vomiting He is now having diffuse abd pain No fever No CP Also - he has chronic wound to left LE that is worsening   Past Medical History:  Diagnosis Date  . Asthma   . Perforated bowel [facility])     Patient Active Problem List   Diagnosis Date Noted  . Dehydration 11/13/2015  . AKI (acute kidney injury) ([place]) 11/13/2015  . Wound infection ([place]) 11/13/2015  . Pulmonary nodule 11/13/2015    Past Surgical History:  Procedure Laterality Date  . CHOLECYSTECTOMY         Home Medications    Prior to Admission medications   Medication Sig Start Date End Date Taking? Authorizing Provider  acetaminophen (TYLENOL) 500 MG tablet Take 1,000 mg by mouth every 6 (six) hours as needed for mild pain or headache.    Historical Provider, MD  cephALEXin (KEFLEX) 500 MG capsule Take 1 capsule (500 mg total) by mouth 3 (three) times daily. 11/14/15   Estela Leonie Green, MD    Family History Family History  Problem Relation Age of Onset  . Diabetes Mother   . Asthma Father     Social History Social History  Substance Use Topics    . Smoking status: Never Smoker  . Smokeless tobacco: Former Systems developer  . Alcohol use No     Allergies   Nutritional supplements and Penicillins   Review of Systems Review of Systems  Constitutional: Negative for fever.  Cardiovascular: Negative for chest pain.  Gastrointestinal: Positive for abdominal pain and vomiting.  Skin: Positive for wound.  All other systems reviewed and are negative.    Physical Exam Updated Vital Signs BP (!) 154/104   Pulse 88   Temp 97.6 F (36.4 C)   Resp 17   Ht '5\' 7"'$  (1.702 m)   Wt 102.1 kg   SpO2 96%   BMI 35.24 kg/m   Physical Exam CONSTITUTIONAL: Disheveled, actively vomiting HEAD: Normocephalic/atraumatic EYES: EOMI, no icterus ENMT: Mucous membranes dry NECK: supple no meningeal signs CV: S1/S2 noted, no murmurs/rubs/gallops noted LUNGS: coarse BS noted bilaterally, mild tachypnea noted ABDOMEN: obese.  Soft.  Diffuse moderate tenderness NEURO: Pt is awake/alert/appropriate, moves all extremitiesx4.  No facial droop.   EXTREMITIES: full ROM, see photo SKIN: warm, color normal PSYCH: no abnormalities of mood noted, alert and oriented to situation     ED Treatments / Results  Labs (all labs ordered are listed, but only abnormal results are displayed) Labs Reviewed  COMPREHENSIVE METABOLIC PANEL - Abnormal; Notable for the following:       Result Value   Glucose, Bld  132 (*)    Calcium 8.3 (*)    Total Protein 8.7 (*)    Albumin 3.3 (*)    AST 13 (*)    ALT 8 (*)    All other components within normal limits  CBC WITH DIFFERENTIAL/PLATELET - Abnormal; Notable for the following:    Hemoglobin 10.9 (*)    HCT 37.6 (*)    MCV 67.1 (*)    MCH 19.5 (*)    MCHC 29.0 (*)    RDW 19.2 (*)    All other components within normal limits  URINALYSIS, ROUTINE W REFLEX MICROSCOPIC (NOT AT [facility]) - Abnormal; Notable for the following:    Hgb urine dipstick TRACE (*)    All other components within normal limits  URINE MICROSCOPIC-ADD  ON - Abnormal; Notable for the following:    Squamous Epithelial / LPF 0-5 (*)    All other components within normal limits  TROPONIN I  LIPASE, BLOOD  BRAIN NATRIURETIC PEPTIDE    EKG  EKG Interpretation  Date/Time:  Wednesday June 07 2016 04:12:26 EDT Ventricular Rate:  90 PR Interval:    QRS Duration: 105 QT Interval:  387 QTC Calculation: 474 R Axis:   67 Text Interpretation:  Sinus rhythm Baseline wander in lead(s) V1 V6 Abnormal ekg changed from prior Confirmed by Christy Gentles  MD, Joyce ([id number]) on 06/07/2016 4:23:56 AM       Radiology Ct Abdomen Pelvis W Contrast  Result Date: 06/07/2016 CLINICAL DATA:  Acute onset of intermittent vomiting. Initial encounter. EXAM: CT ABDOMEN AND PELVIS WITH CONTRAST TECHNIQUE: Multidetector CT imaging of the abdomen and pelvis was performed using the standard protocol following bolus administration of intravenous contrast. CONTRAST:  153m ISOVUE-300 IOPAMIDOL (ISOVUE-300) INJECTION 61% COMPARISON:  CT of the abdomen and pelvis performed 11/13/2015 FINDINGS: Minimal bibasilar atelectasis or scarring is noted. The liver and spleen are unremarkable in appearance. The patient is status post cholecystectomy, with clips noted at the gallbladder fossa. A 2.2 cm nodule is noted at the left adrenal gland, relatively stable from 2011 and likely benign. The pancreas and right adrenal gland are unremarkable. The kidneys are unremarkable in appearance. Mild prominence of the left renal pelvis is stable in appearance and reflects the patient's baseline. There is no evidence of hydronephrosis. No renal or ureteral stones are seen. No perinephric stranding is appreciated. No free fluid is identified. The small bowel is unremarkable in appearance. The stomach is within normal limits. No acute vascular abnormalities are seen. Minimal calcification is seen along the distal abdominal aorta and its branches. The appendix is not well characterized; there is no evidence of  appendicitis. The colon is grossly unremarkable in appearance. The bladder is mildly distended. Mild soft tissue inflammation is noted about the bladder, concerning for cystitis. The prostate remains normal in size. No inguinal lymphadenopathy is seen. No acute osseous abnormalities are identified. IMPRESSION: 1. Mild soft tissue inflammation about the bladder raises concern for cystitis. 2. 2.2 cm left adrenal nodule is relatively stable from 2011 and likely benign. Electronically Signed   By: JGarald BaldingM.D.   On: 06/07/2016 06:55   Dg Chest Port 1 View  Result Date: 06/07/2016 CLINICAL DATA:  Initial evaluation for acute chest pain. EXAM: PORTABLE CHEST 1 VIEW COMPARISON:  Prior radiograph from 11/13/2015. FINDINGS: Cardiomegaly stable from prior. Mediastinal silhouette within normal limits. Lungs are hypoinflated. Diffuse pulmonary vascular congestion with indistinctness of the interstitial markings, suggestive of mild diffuse pulmonary edema. No definite pleural effusion. No focal infiltrates  identified. No pneumothorax. No acute osseous abnormality. IMPRESSION: Cardiomegaly with diffuse pulmonary vascular congestion and indistinctness of the interstitial markings, suggestive of mild diffuse pulmonary edema. Electronically Signed   By: Jeannine Boga M.D.   On: 06/07/2016 05:53    Procedures Procedures (including critical care time)  Medications Ordered in ED Medications  diatrizoate meglumine-sodium (GASTROGRAFIN) 66-10 % solution (not administered)  nitroGLYCERIN (NITROGLYN) 2 % ointment 1 inch (1 inch Topical Given 06/07/16 0731)  ondansetron (ZOFRAN) injection 4 mg (4 mg Intravenous Given 06/07/16 0515)  ondansetron (ZOFRAN) injection 4 mg (4 mg Intravenous Given 06/07/16 0541)  fentaNYL (SUBLIMAZE) injection 100 mcg (100 mcg Intravenous Given 06/07/16 0542)  iopamidol (ISOVUE-300) 61 % injection 100 mL (100 mLs Intravenous Contrast Given 06/07/16 0635)  furosemide (LASIX) injection 40 mg  (40 mg Intravenous Given 06/07/16 0708)  fentaNYL (SUBLIMAZE) injection 50 mcg (50 mcg Intravenous Given 06/07/16 0728)     Initial Impression / Assessment and Plan / ED Course  I have reviewed the triage vital signs and the nursing notes.  Pertinent labs & imaging results that were available during my care of the patient were reviewed by me and considered in my medical decision making (see chart for details).  Clinical Course    6:07 AM Pt here for multiple issues: Vomiting/abdominal pain - due to diffuse pain, will obtain CT imaging  CXR reveals probable CHF  He has chronic wound to left LE.  Pt will require admission after CT imaging  8:01 AM CT scan negative Pt reports shortness of breath, concern for possible CHF He also needs local wound care to left LE and may need DVT study D/w dr Roderic Palau for admission Patient agreeable with plan   Final Clinical Impressions(s) / ED Diagnoses   Final diagnoses:  Generalized abdominal pain  Nausea and vomiting, vomiting of unspecified type  Acute congestive heart failure, unspecified congestive heart failure type [facility])    New Prescriptions New Prescriptions   No medications on file     Ripley Fraise, MD 06/07/16 [phone]

## 2016-06-08 ENCOUNTER — Inpatient Hospital Stay (HOSPITAL_COMMUNITY): Payer: Medicare Other

## 2016-06-08 DIAGNOSIS — R1084 Generalized abdominal pain: Secondary | ICD-10-CM

## 2016-06-08 DIAGNOSIS — R1110 Vomiting, unspecified: Secondary | ICD-10-CM

## 2016-06-08 DIAGNOSIS — R112 Nausea with vomiting, unspecified: Secondary | ICD-10-CM

## 2016-06-08 DIAGNOSIS — I5031 Acute diastolic (congestive) heart failure: Secondary | ICD-10-CM

## 2016-06-08 DIAGNOSIS — D509 Iron deficiency anemia, unspecified: Secondary | ICD-10-CM

## 2016-06-08 DIAGNOSIS — I509 Heart failure, unspecified: Secondary | ICD-10-CM

## 2016-06-08 DIAGNOSIS — R609 Edema, unspecified: Secondary | ICD-10-CM

## 2016-06-08 LAB — ECHOCARDIOGRAM COMPLETE
AVLVOTPG: 13 mmHg
CHL CUP MV DEC (S): 296
CHL CUP STROKE VOLUME: 30 mL
E decel time: 296 msec
EERAT: 8.89
FS: 44 % (ref 28–44)
HEIGHTINCHES: 67 in
IV/PV OW: 1.08
LA diam index: 1.69 cm/m2
LA vol index: 25.9 mL/m2
LASIZE: 38 mm
LAVOL: 58.3 mL
LAVOLA4C: 52.5 mL
LDCA: 2.84 cm2
LEFT ATRIUM END SYS DIAM: 38 mm
LV PW d: 12.7 mm — AB (ref 0.6–1.1)
LV SIMPSON'S DISK: 68
LV dias vol: 45 mL — AB (ref 62–150)
LV sys vol index: 6 mL/m2
LVDIAVOLIN: 20 mL/m2
LVEEAVG: 8.89
LVEEMED: 8.89
LVELAT: 10.1 cm/s
LVOT VTI: 34.6 cm
LVOT diameter: 19 mm
LVOT peak vel: 178 cm/s
LVOTSV: 98 mL
LVSYSVOL: 14 mL — AB (ref 21–61)
Lateral S' vel: 14.4 cm/s
MV pk A vel: 82.9 m/s
MV pk E vel: 89.8 m/s
MVPG: 3 mmHg
TAPSE: 22.8 mm
TDI e' lateral: 10.1
TDI e' medial: 5.77
WEIGHTICAEL: 3633.18 [oz_av]

## 2016-06-08 LAB — BASIC METABOLIC PANEL
Anion gap: 4 — ABNORMAL LOW (ref 5–15)
BUN: 9 mg/dL (ref 6–20)
CO2: 36 mmol/L — ABNORMAL HIGH (ref 22–32)
CREATININE: 1.14 mg/dL (ref 0.61–1.24)
Calcium: 8.3 mg/dL — ABNORMAL LOW (ref 8.9–10.3)
Chloride: 96 mmol/L — ABNORMAL LOW (ref 101–111)
GFR calc Af Amer: >60 mL/min (ref >60)
GLUCOSE: 99 mg/dL (ref 65–99)
POTASSIUM: 4 mmol/L (ref 3.5–5.1)
SODIUM: 136 mmol/L (ref 135–145)

## 2016-06-08 MED ORDER — HYOSCYAMINE SULFATE 0.125 MG SL SUBL
0.1250 mg | SUBLINGUAL_TABLET | Freq: Four times a day (QID) | SUBLINGUAL | Status: AC
  Administered 2016-06-08 – 2016-06-09 (×4): 0.125 mg via SUBLINGUAL
  Filled 2016-06-08 (×4): qty 1

## 2016-06-08 MED ORDER — PROMETHAZINE HCL 12.5 MG PO TABS: 12.5000 mg | ORAL_TABLET | Freq: Four times a day (QID) | ORAL | Status: DC | PRN

## 2016-06-08 MED ORDER — ONDANSETRON HCL 4 MG/2ML IJ SOLN
4.0000 mg | Freq: Four times a day (QID) | INTRAMUSCULAR | Status: DC
  Administered 2016-06-09 – 2016-06-10 (×3): 4 mg via INTRAVENOUS
  Filled 2016-06-08 (×5): qty 2

## 2016-06-08 MED ORDER — CYANOCOBALAMIN 1000 MCG/ML IJ SOLN
1000.0000 ug | Freq: Every day | INTRAMUSCULAR | Status: DC
  Administered 2016-06-08 – 2016-06-12 (×5): 1000 ug via INTRAMUSCULAR
  Filled 2016-06-08 (×5): qty 1

## 2016-06-08 MED ORDER — ONDANSETRON HCL 4 MG/2ML IJ SOLN
4.0000 mg | Freq: Four times a day (QID) | INTRAMUSCULAR | Status: DC
  Administered 2016-06-08 – 2016-06-12 (×10): 4 mg via INTRAVENOUS
  Filled 2016-06-08 (×9): qty 2

## 2016-06-08 NOTE — Progress Notes (Signed)
PROGRESS NOTE    James Zamora  ZJQ:[id number] DOB: 1956/01/11 DOA: 06/07/2016 PCP: Maggie Font, MD    Brief Narrative:  29 yom with a hx of asthma presents with complaints of emesis and intermittent abdominal pain. Marland KitchenHe also stated he has worsening LE edema. On his left LE, it was noted that he has a large wound. This appears to be chronic and was present on 10/2015.On admission pt was noted to be hypotensive. CXR revealed cardiomegaly with diffuse pulmonary vascular congestion likely due to CHF. He has been admitted for further treatment of CHF.    Assessment & Plan:   Active Problems:   CHF (congestive heart failure) (HCC)   Emesis   Generalized abdominal pain   Microcytic anemia  1. Acute diastolic CHF. CXR revealed cardiomegaly with diffuse pulmonary vascular congestion. He has an EF of 60-65%. He's had good diuresis with IV lasix and is -4L since admission. Since he continues to have evidence of overload will continue  IV Lasix. Will continue beta blockers and low-dose ACEi. Cardiac markers are found to be negative.  2. Nausea/vomitting/abdominal pain. ? related to post infectious IBS. Ct of abd/pelvis was unrevealing. Liver function tests were normal, treat supportively for now. Will keep NPO until symptoms have improved. He is s/p cholecystectomy. Since he continues to complain of significant abdominal pain will request GI input.  3. HTN. He is on beta blockers and ACEi. .  4. Chronic left LE wound. Wound care consulted. Does not appear to be actively infected DVT was ruled out.  5. Inguinal lymphadenopathy. Appears to be a chronic issue. This can be further workup after discharge 6. Microcytic anemia. Anemia panel shows iron deficiency as well as interestingly B12 deficiency. Will start Pt on supplementation. No evidence of bleeding at this time. Occult blood has been ordered.   DVT prophylaxis: Heparin Code Status: Full Family Communication: No family bedside Disposition  Plan: Discharge home once improved    Consultants:   Wound care   Procedures:   Echo  Study Conclusions  - Left ventricle: The cavity size was normal. Wall thickness was   increased in a pattern of moderate LVH. Systolic function was   normal. The estimated ejection fraction was in the range of 60%   to 65%. Wall motion was normal; there were no regional wall   motion abnormalities. Left ventricular diastolic function   parameters were normal. - Aortic valve: Valve area (VTI): 2.72 cm^2. Valve area (Vmax):   2.72 cm^2. Valve area (Vmean): 2.71 cm^2. - Right ventricle: The cavity size was mildly dilated. - Atrial septum: No defect or patent foramen ovale was identified. - Technically adequate study.  Antimicrobials:   None    Subjective: Feels mildly better. Cramping abdominal pain that comes and goes. Still dry heaving. Has been drinking. Breathing well. LE edema has improved.   Objective: Vitals:   06/07/16 1200 06/07/16 1300 06/07/16 1530 06/07/16 2143  BP: (!) 151/92 137/79 (!) 172/80 127/66  Pulse:  79 (!) 18 88  Resp: '17 19 16 20  '$ Temp:   97.5 F (36.4 C) 98.8 F (37.1 C)  TempSrc:   Oral Oral  SpO2:  96% 95% 99%  Weight:   103 kg (227 lb 1.2 oz)   Height:   '5\' 7"'$  (1.702 m)     Intake/Output Summary (Last 24 hours) at 06/08/16 0755 Last data filed at 06/08/16 0600  Gross per 24 hour  Intake  0 ml  Output             3325 ml  Net            -3325 ml   Filed Weights   06/07/16 0415 06/07/16 1530  Weight: 102.1 kg (225 lb) 103 kg (227 lb 1.2 oz)    Examination:  General exam: Appears calm and comfortable  Respiratory system: Crackles at bases. Respiratory effort normal. Cardiovascular system: S1 & S2 heard, RRR. No JVD, murmurs, rubs, gallops or clicks. . Gastrointestinal system: Abdomen is nondistended, soft and nontender. No organomegaly or masses felt. Normal bowel sounds heard. Central nervous system: Alert and oriented. No focal  neurological deficits. Extremities: Bilateral LE wrapped in ace bandages. Skin: No rashes, lesions or ulcers Psychiatry: Judgement and insight appear normal. Mood & affect appropriate.     Data Reviewed: I have personally reviewed following labs and imaging studies  CBC:  Recent Labs Lab 06/07/16 0415 06/07/16 1140  WBC 6.0 7.4  NEUTROABS 4.6  --   HGB 10.9* 10.9*  HCT 37.6* 37.5*  MCV 67.1* 67.4*  PLT 366 767   Basic Metabolic Panel:  Recent Labs Lab 06/07/16 0415 06/07/16 1140 06/08/16 0607  NA 135  --  136  K 3.7  --  4.0  CL 101  --  96*  CO2 29  --  36*  GLUCOSE 132*  --  99  BUN 8  --  9  CREATININE 1.12 1.08 1.14  CALCIUM 8.3*  --  8.3*   GFR: Estimated Creatinine Clearance: 78.8 mL/min (by C-G formula based on SCr of 1.14 mg/dL). Liver Function Tests:  Recent Labs Lab 06/07/16 0415  AST 13*  ALT 8*  ALKPHOS 77  BILITOT 0.3  PROT 8.7*  ALBUMIN 3.3*    Recent Labs Lab 06/07/16 0415  LIPASE 18   No results for input(s): AMMONIA in the last 168 hours. Coagulation Profile: No results for input(s): INR, PROTIME in the last 168 hours. Cardiac Enzymes:  Recent Labs Lab 06/07/16 0415 06/07/16 1140 06/07/16 1636 06/07/16 2255  TROPONINI <0.03 <0.03 <0.03 <0.03   BNP (last 3 results) No results for input(s): PROBNP in the last 8760 hours. HbA1C: No results for input(s): HGBA1C in the last 72 hours. CBG: No results for input(s): GLUCAP in the last 168 hours. Lipid Profile: No results for input(s): CHOL, HDL, LDLCALC, TRIG, CHOLHDL, LDLDIRECT in the last 72 hours. Thyroid Function Tests: No results for input(s): TSH, T4TOTAL, FREET4, T3FREE, THYROIDAB in the last 72 hours. Anemia Panel:  Recent Labs  06/07/16 1140  VITAMINB12 146*  FOLATE 6.7  FERRITIN 37  TIBC 454*  IRON 18*  RETICCTPCT 0.9   Sepsis Labs: No results for input(s): PROCALCITON, LATICACIDVEN in the last 168 hours.  Recent Results (from the past 240 hour(s))    MRSA PCR Screening     Status: None   Collection Time: 06/07/16  1:22 PM  Result Value Ref Range Status   MRSA by PCR NEGATIVE NEGATIVE Final    Comment:        The GeneXpert MRSA Assay (FDA approved for NASAL specimens only), is one component of a comprehensive MRSA colonization surveillance program. It is not intended to diagnose MRSA infection nor to guide or monitor treatment for MRSA infections.          Radiology Studies: Ct Abdomen Pelvis W Contrast  Result Date: 06/07/2016 CLINICAL DATA:  Acute onset of intermittent vomiting. Initial encounter. EXAM: CT ABDOMEN AND PELVIS WITH  CONTRAST TECHNIQUE: Multidetector CT imaging of the abdomen and pelvis was performed using the standard protocol following bolus administration of intravenous contrast. CONTRAST:  167m ISOVUE-300 IOPAMIDOL (ISOVUE-300) INJECTION 61% COMPARISON:  CT of the abdomen and pelvis performed 11/13/2015 FINDINGS: Minimal bibasilar atelectasis or scarring is noted. The liver and spleen are unremarkable in appearance. The patient is status post cholecystectomy, with clips noted at the gallbladder fossa. A 2.2 cm nodule is noted at the left adrenal gland, relatively stable from 2011 and likely benign. The pancreas and right adrenal gland are unremarkable. The kidneys are unremarkable in appearance. Mild prominence of the left renal pelvis is stable in appearance and reflects the patient's baseline. There is no evidence of hydronephrosis. No renal or ureteral stones are seen. No perinephric stranding is appreciated. No free fluid is identified. The small bowel is unremarkable in appearance. The stomach is within normal limits. No acute vascular abnormalities are seen. Minimal calcification is seen along the distal abdominal aorta and its branches. The appendix is not well characterized; there is no evidence of appendicitis. The colon is grossly unremarkable in appearance. The bladder is mildly distended. Mild soft tissue  inflammation is noted about the bladder, concerning for cystitis. The prostate remains normal in size. No inguinal lymphadenopathy is seen. No acute osseous abnormalities are identified. IMPRESSION: 1. Mild soft tissue inflammation about the bladder raises concern for cystitis. 2. 2.2 cm left adrenal nodule is relatively stable from 2011 and likely benign. Electronically Signed   By: JGarald BaldingM.D.   On: 06/07/2016 06:55   [place]Venous Img Lower Unilateral Left  Result Date: 06/07/2016 CLINICAL DATA:  Left lower extremity swelling. Short of breath for 3 weeks. EXAM: LEFT LOWER EXTREMITY VENOUS DUPLEX ULTRASOUND TECHNIQUE: Doppler venous assessment of the left lower extremity deep venous system was performed, including characterization of spectral flow, compressibility, and phasicity. COMPARISON:  05/15/2005 FINDINGS: There is complete compressibility of the left common femoral, femoral, and popliteal veins. Doppler analysis demonstrates respiratory phasicity and augmentation of flow with calf compression. No obvious superficial vein or calf vein thrombosis. Abnormally enlarged left inguinal lymph nodes are present. One measures up to 4.6 cm in the other measures up to 5.3 cm. IMPRESSION: No evidence of left lower extremity DVT. Abnormal left inguinal adenopathy. Differential diagnosis includes inflammatory and malignant etiology. Correlate clinically for the need for further imaging. Electronically Signed   By: AMarybelle KillingsM.D.   On: 06/07/2016 12:41   Dg Chest Port 1 View  Result Date: 06/07/2016 CLINICAL DATA:  Initial evaluation for acute chest pain. EXAM: PORTABLE CHEST 1 VIEW COMPARISON:  Prior radiograph from 11/13/2015. FINDINGS: Cardiomegaly stable from prior. Mediastinal silhouette within normal limits. Lungs are hypoinflated. Diffuse pulmonary vascular congestion with indistinctness of the interstitial markings, suggestive of mild diffuse pulmonary edema. No definite pleural effusion. No focal  infiltrates identified. No pneumothorax. No acute osseous abnormality. IMPRESSION: Cardiomegaly with diffuse pulmonary vascular congestion and indistinctness of the interstitial markings, suggestive of mild diffuse pulmonary edema. Electronically Signed   By: BJeannine BogaM.D.   On: 06/07/2016 05:53        Scheduled Meds: . aspirin EC  81 mg Oral Daily  . carvedilol  6.25 mg Oral BID WC  . furosemide  40 mg Intravenous BID  . heparin  5,000 Units Subcutaneous Q8H  . lisinopril  5 mg Oral Daily  . sodium chloride flush  3 mL Intravenous Q12H   Continuous Infusions:    LOS: 1 day  Time spent: 25 minutes    Kathie Dike, MD [facility] If 7PM-7AM, please contact night-coverage [email or web address] Password [email or web address] 06/08/2016, 7:55 AM   By signing my name below, I, Collene Leyden, attest that this documentation has been prepared under the direction and in the presence of Kathie Dike, MD. Electronically signed: Collene Leyden, Scribe. 06/08/16 12:19 PM  I, Dr. Kathie Dike, personally performed the services described in this documentaiton. All medical record entries made by the scribe were at my direction and in my presence. I have reviewed the chart and agree that the record reflects my personal performance and is accurate and complete  Kathie Dike, MD, 06/08/2016 1:03 PM

## 2016-06-08 NOTE — Progress Notes (Signed)
*  PRELIMINARY RESULTS* Echocardiogram 2D Echocardiogram has been performed.  Samuel Germany 06/08/2016, 11:29 AM

## 2016-06-08 NOTE — Consult Note (Addendum)
Referring Provider: No ref. provider found Primary Care Physician:  Maggie Font, MD Primary Gastroenterologist:  Dr. Gala Romney  Date of Admission:  Date of Consultation:   Reason for Consultation:  Abdominal pain, vomiting  HPI:  James Zamora is a 60 y.o. male with a past medical history of asthma and perforated bowel presented to the ER with complaints of abdominal pain and emesis since eating at a restaurant about 6 days ago. Per hosptialist note, pain is sharp in the periumbilical region. Unable to tolerate po intake. Denies hematemesis. CT abdomen/pelvis unremarkable and specifically without overt arterial calcifications noted, CMP and lipase unremarkable. Noted microcytic anemia on admission within past 49-monthbaseline. Pulmonary congestion on chest XRay likely CHF exacerbation. Also with a LE leg wound. [facility]nurse consulted. Kidney function within baseline.   Today he confirms the above information. Abdominal pain, nausea, and vomiting started a few hours after eating out at a restaurant. Last episode of emesis this morning. Has not been requesting nausea medication. No change in bowel habits (no diarrhea). Last bowel movement about 6 days ago, but has not been able to eat or drink much since falling ill. Denies hematochezia, melena, hematemesis. Pain is periumbilical, sharp, intermittent. Denies fever, chills. Denies other upper or lower GI symptoms.  Past Medical History:  Diagnosis Date  . Asthma   . Perforated bowel ([facility]     Past Surgical History:  Procedure Laterality Date  . CHOLECYSTECTOMY      Prior to Admission medications   Medication Sig Start Date End Date Taking? Authorizing Provider  acetaminophen (TYLENOL) 500 MG tablet Take 1,000 mg by mouth every 6 (six) hours as needed for mild pain or headache.   Yes Historical Provider, MD    Current Facility-Administered Medications  Medication Dose Route Frequency Provider Last Rate Last Dose  . 0.9 %  sodium  chloride infusion  250 mL Intravenous PRN JKathie Dike MD      . acetaminophen (TYLENOL) tablet 650 mg  650 mg Oral Q4H PRN JKathie Dike MD      . aspirin EC tablet 81 mg  81 mg Oral Daily JKathie Dike MD   81 mg at 06/08/16 0946  . carvedilol (COREG) tablet 6.25 mg  6.25 mg Oral BID WC JKathie Dike MD   6.25 mg at 06/08/16 0946  . furosemide (LASIX) injection 40 mg  40 mg Intravenous BID JKathie Dike MD   40 mg at 06/08/16 0938  . heparin injection 5,000 Units  5,000 Units Subcutaneous Q8H JKathie Dike MD   5,000 Units at 06/08/16 0620  . hydrALAZINE (APRESOLINE) injection 10 mg  10 mg Intravenous Q4H PRN JKathie Dike MD   10 mg at 06/07/16 1739  . lisinopril (PRINIVIL,ZESTRIL) tablet 5 mg  5 mg Oral Daily JKathie Dike MD   5 mg at 06/08/16 0946  . morphine 2 MG/ML injection 2 mg  2 mg Intravenous Q4H PRN JKathie Dike MD   2 mg at 06/08/16 09323 . ondansetron (ZOFRAN) injection 4 mg  4 mg Intravenous Q6H PRN JKathie Dike MD   4 mg at 06/07/16 2358  . ondansetron (ZOFRAN) injection 4 mg  4 mg Intravenous Q6H PRN JKathie Dike MD   4 mg at 06/08/16 0938  . sodium chloride flush (NS) 0.9 % injection 3 mL  3 mL Intravenous Q12H JKathie Dike MD   3 mL at 06/08/16 1000  . sodium chloride flush (NS) 0.9 % injection 3 mL  3 mL Intravenous PRN JWinn-Dixie  Memon, MD        Allergies as of 06/07/2016 - Review Complete 06/07/2016  Allergen Reaction Noted  . Nutritional supplements Hives and Swelling 06/12/2011  . Penicillins  06/12/2011    Family History  Problem Relation Age of Onset  . Diabetes Mother   . Asthma Father     Social History   Social History  . Marital status: Single    Spouse name: N/A  . Number of children: N/A  . Years of education: N/A   Occupational History  . Not on file.   Social History Main Topics  . Smoking status: Never Smoker  . Smokeless tobacco: Former Systems developer  . Alcohol use No  . Drug use: No  . Sexual activity: Not on file    Other Topics Concern  . Not on file   Social History Narrative  . No narrative on file    Review of Systems: General: Negative for anorexia, fever, chills, fatigue, weakness. ENT: Negative for hoarseness, difficulty swallowing. CV: Negative for chest pain, angina, palpitations, peripheral edema.  Respiratory: Negative for dyspnea at rest, cough, sputum, wheezing.  GI: See history of present illness. Neuro: Negative for memory loss, confusion.  Endo: Negative for unusual weight change.  Heme: Negative for bruising or bleeding.  Physical Exam: Vital signs in last 24 hours: Temp:  [97.5 F (36.4 C)-98.8 F (37.1 C)] 98.8 F (37.1 C) (08/09 2143) Pulse Rate:  [18-88] 86 (08/10 0946) Resp:  [16-20] 20 (08/09 2143) BP: (121-172)/(64-80) 121/64 (08/10 0946) SpO2:  [95 %-99 %] 99 % (08/09 2143) Weight:  [227 lb 1.2 oz (103 kg)] 227 lb 1.2 oz (103 kg) (08/09 1530)   General:   Alert,  Well-developed, well-nourished, pleasant and cooperative in NAD. Resting comfortably in bed. Head:  Normocephalic and atraumatic. Eyes:  Sclera clear, no icterus. Conjunctiva pink. Ears:  Normal auditory acuity. Lungs:  Clear throughout to auscultation. No wheezes, crackles, or rhonchi. No acute distress. Heart:  Regular rate and rhythm; no murmurs, clicks, rubs,  or gallops. Abdomen:  Rounded but soft and nondistended. Mild to moderate TTP periumbilical/LLQ abdomen. No masses, hepatosplenomegaly or hernias noted. Normal bowel sounds, without guarding, and without rebound.   Rectal:  Deferred.   Msk:  Symmetrical without gross deformities. Neurologic:  Alert and  oriented x4;  grossly normal neurologically. Skin:  Lower extremities with dressings/wraps. Psych:  Alert and cooperative. Normal mood and affect.  Intake/Output from previous day: 08/09 0701 - 08/10 0700 In: -  Out: 3775 [Urine:3775] Intake/Output this shift: Total I/O In: -  Out: 300 [Urine:300]  Lab Results:  Recent Labs   06/07/16 0415 06/07/16 1140  WBC 6.0 7.4  HGB 10.9* 10.9*  HCT 37.6* 37.5*  PLT 366 355   BMET  Recent Labs  06/07/16 0415 06/07/16 1140 06/08/16 0607  NA 135  --  136  K 3.7  --  4.0  CL 101  --  96*  CO2 29  --  36*  GLUCOSE 132*  --  99  BUN 8  --  9  CREATININE 1.12 1.08 1.14  CALCIUM 8.3*  --  8.3*   LFT  Recent Labs  06/07/16 0415  PROT 8.7*  ALBUMIN 3.3*  AST 13*  ALT 8*  ALKPHOS 77  BILITOT 0.3   PT/INR No results for input(s): LABPROT, INR in the last 72 hours. Hepatitis Panel No results for input(s): HEPBSAG, HCVAB, HEPAIGM, HEPBIGM in the last 72 hours. C-Diff No results for input(s): CDIFFTOX in  the last 72 hours.  Studies/Results: Ct Abdomen Pelvis W Contrast  Result Date: 06/07/2016 CLINICAL DATA:  Acute onset of intermittent vomiting. Initial encounter. EXAM: CT ABDOMEN AND PELVIS WITH CONTRAST TECHNIQUE: Multidetector CT imaging of the abdomen and pelvis was performed using the standard protocol following bolus administration of intravenous contrast. CONTRAST:  18m ISOVUE-300 IOPAMIDOL (ISOVUE-300) INJECTION 61% COMPARISON:  CT of the abdomen and pelvis performed 11/13/2015 FINDINGS: Minimal bibasilar atelectasis or scarring is noted. The liver and spleen are unremarkable in appearance. The patient is status post cholecystectomy, with clips noted at the gallbladder fossa. A 2.2 cm nodule is noted at the left adrenal gland, relatively stable from 2011 and likely benign. The pancreas and right adrenal gland are unremarkable. The kidneys are unremarkable in appearance. Mild prominence of the left renal pelvis is stable in appearance and reflects the patient's baseline. There is no evidence of hydronephrosis. No renal or ureteral stones are seen. No perinephric stranding is appreciated. No free fluid is identified. The small bowel is unremarkable in appearance. The stomach is within normal limits. No acute vascular abnormalities are seen. Minimal  calcification is seen along the distal abdominal aorta and its branches. The appendix is not well characterized; there is no evidence of appendicitis. The colon is grossly unremarkable in appearance. The bladder is mildly distended. Mild soft tissue inflammation is noted about the bladder, concerning for cystitis. The prostate remains normal in size. No inguinal lymphadenopathy is seen. No acute osseous abnormalities are identified. IMPRESSION: 1. Mild soft tissue inflammation about the bladder raises concern for cystitis. 2. 2.2 cm left adrenal nodule is relatively stable from 2011 and likely benign. Electronically Signed   By: JGarald BaldingM.D.   On: 06/07/2016 06:55   [place]ous Img Lower Unilateral Left  Result Date: 06/07/2016 CLINICAL DATA:  Left lower extremity swelling. Short of breath for 3 weeks. EXAM: LEFT LOWER EXTREMITY VENOUS DUPLEX ULTRASOUND TECHNIQUE: Doppler venous assessment of the left lower extremity deep venous system was performed, including characterization of spectral flow, compressibility, and phasicity. COMPARISON:  05/15/2005 FINDINGS: There is complete compressibility of the left common femoral, femoral, and popliteal veins. Doppler analysis demonstrates respiratory phasicity and augmentation of flow with calf compression. No obvious superficial vein or calf vein thrombosis. Abnormally enlarged left inguinal lymph nodes are present. One measures up to 4.6 cm in the other measures up to 5.3 cm. IMPRESSION: No evidence of left lower extremity DVT. Abnormal left inguinal adenopathy. Differential diagnosis includes inflammatory and malignant etiology. Correlate clinically for the need for further imaging. Electronically Signed   By: AMarybelle KillingsM.D.   On: 06/07/2016 12:41   Dg Chest Port 1 View  Result Date: 06/07/2016 CLINICAL DATA:  Initial evaluation for acute chest pain. EXAM: PORTABLE CHEST 1 VIEW COMPARISON:  Prior radiograph from 11/13/2015. FINDINGS: Cardiomegaly stable from  prior. Mediastinal silhouette within normal limits. Lungs are hypoinflated. Diffuse pulmonary vascular congestion with indistinctness of the interstitial markings, suggestive of mild diffuse pulmonary edema. No definite pleural effusion. No focal infiltrates identified. No pneumothorax. No acute osseous abnormality. IMPRESSION: Cardiomegaly with diffuse pulmonary vascular congestion and indistinctness of the interstitial markings, suggestive of mild diffuse pulmonary edema. Electronically Signed   By: BJeannine BogaM.D.   On: 06/07/2016 05:53    Impression: 60year old male with sudden onset periumbilical/LLQ abdominal pain with nausea and vomiting, poor po intake several hours after eating out at a restaurant about 1 week ago. Continuyed abdominal pain and nausea/vomiting although on exam  abdominal pain does not seem to be severe. Last emesis this morning, not requesting nausea medications. No hematochezia, melena, fever, chills, or other red flag/warning symptoms. CT unremarkable, labs unremarkable. Has an open LE leg wound, seeing [facility] nurse services with recommendations. CHF, being treated.  GI symptoms likely gastroenteritis, possible food-borne illness with poorly controlled nausea and decreased appetite. Of concern, he states he has never had a colonoscopy and discussed with him the need to have one done, likely on outpatient basis with acute illness has resolved.  Plan: 1. Schedule Zofran q 6 hours 2. Phenergan prn for breakthrough nausea 3. Pain management per hospitalist 4. If does well overnight, can consider advancing to clear liquids tomorrow 5. Continued IV hydration in the meantime. 6. Supportive measures 7. Monitor for overt GI bleed in the setting of microcytic anemia 8. Likely endoscopic evaluation for anemia and CRC screening as outpatient   Thank you for allowing [place] to participate in the care of James Holes, DNP, AGNP-C Adult & Gerontological Nurse  Practitioner [facility]    LOS: 1 day     06/08/2016, 1:00 PM   Attending note:  Patient seen and examined. Patient most likely gastroenteritis either foodborne illness or viral related.  Diarrhea notably absent.  Currently, he does not appear acutely ill or toxic.  Treatment at this time is largely supportive.  Agree with the above assessment and recommendations. Will add hyoscyamine sublingually every 6 hours 4 doses to his regimen.  Dr. Oneida Alar will reassess tomorrow in my absence.

## 2016-06-08 NOTE — Consult Note (Signed)
[facility] Nurse wound consult note Reason for Consult:Chronic venous insufficiency with ulceration to left lower leg.  Open circumferentially. Patient has been using an unknown topical cream and wrapping with ace wrap (minimally compliant with this according to his sister)  Wound type:Chronic venous Pressure Ulcer POA: N/A Measurement:10 cm x 22 cm x 0.2 cm  Wound VWU:JWJX pink and moist.  Nongranulating Drainage (amount, consistency, odor) Moderate serous weeping.  Musty odor.   Periwound:Dry skin.  Chronic skin changes.  Generalized edema to bilateral lower legs.  Dressing procedure/placement/frequency:Cleanse bilateral lower legs with soap and water. APply Aquacel Ag silver hydrofiber to nonintact lesions. Wrap with 3 layer, Profore LIte modified compression. Change weekly. Recommend [place] after discharge.  Will not follow at this time.  Please re-consult if needed.  Domenic Moras RN BSN [place] Pager [phone]

## 2016-06-09 LAB — BASIC METABOLIC PANEL
Anion gap: 10 (ref 5–15)
BUN: 21 mg/dL — ABNORMAL HIGH (ref 6–20)
CALCIUM: 8.4 mg/dL — AB (ref 8.9–10.3)
CO2: 34 mmol/L — AB (ref 22–32)
CREATININE: 1.4 mg/dL — AB (ref 0.61–1.24)
Chloride: 92 mmol/L — ABNORMAL LOW (ref 101–111)
GFR calc non Af Amer: 53 mL/min — ABNORMAL LOW (ref >60)
GLUCOSE: 66 mg/dL (ref 65–99)
Potassium: 3.8 mmol/L (ref 3.5–5.1)
Sodium: 136 mmol/L (ref 135–145)

## 2016-06-09 MED ORDER — PANTOPRAZOLE SODIUM 40 MG PO TBEC
40.0000 mg | DELAYED_RELEASE_TABLET | Freq: Every day | ORAL | Status: DC
  Administered 2016-06-09 – 2016-06-12 (×4): 40 mg via ORAL
  Filled 2016-06-09 (×4): qty 1

## 2016-06-09 NOTE — Progress Notes (Signed)
PROGRESS NOTE    James Zamora  ZOX:[id number] DOB: 10-Nov-1955 DOA: 06/07/2016 PCP: Maggie Font, MD    Brief Narrative:  89 yom with a hx of asthma presents with complaints of emesis and intermittent abdominal pain. He also stated he has worsening LE edema. On his left LE, it was noted that he has a large wound. This appears to be chronic and was present on 10/2015.On admission pt was noted to be hypertensive. CXR revealed cardiomegaly with diffuse pulmonary vascular congestion likely due to CHF. He has been admitted for further treatment of CHF. ECHO showed EF of 60 - 65%.   Assessment & Plan:   Active Problems:   CHF (congestive heart failure) (HCC)   Emesis   Generalized abdominal pain   Microcytic anemia   Swelling  1. Acute diastolic CHF. Cr 1.40 and BUN 21. CXR revealed cardiomegaly with diffuse pulmonary vascular congestion. ECHO showed EF of 60-65%. He's had good diuresis with IV lasix and is -5.3L since admission. With creatinine trending up, will plan on transition to oral diuretics in AM. Will continue beta blockers and low-dose ACEi. If creatinine continues to rise, will need to discontinue lisinopril. Cardiac markers are found to be negative. Continue to monitor in the setting of diuresis. Follow up with cardiology as an outpatient 2. Nausea/vomitting/abdominal pain. ? related to post infectious IBS. Ct of abd/pelvis was unrevealing. Liver function tests were normal, treat supportively for now. Appreciate GI input. Started on scheduled zofran and prn levsin. Advancing diet as tolerated. Continue to monitor. 3. HTN. He is on beta blockers and ACEi. Currently stable. 4. Chronic left LE wound. Wound care consult appreciated. Does not appear to be actively infected. DVT was ruled out.  5. Inguinal lymphadenopathy. Appears to be a chronic issue. This can be further workup as an outpatient. 6. Microcytic anemia. Anemia panel shows iron deficiency as well as interestingly B12  deficiency. Continue on supplementation. No evidence of bleeding at this time. Follow up with FOBT. Likely further work up with endoscopy and colonoscopy as an outpatient   DVT prophylaxis: Heparin Code Status: Full Family Communication: discussed with patient Disposition Plan: Discharge home once improved    Consultants:   Wound care   GI  Procedures:   Echo  Study Conclusions  - Left ventricle: The cavity size was normal. Wall thickness was   increased in a pattern of moderate LVH. Systolic function was   normal. The estimated ejection fraction was in the range of 60%   to 65%. Wall motion was normal; there were no regional wall   motion abnormalities. Left ventricular diastolic function   parameters were normal. - Aortic valve: Valve area (VTI): 2.72 cm^2. Valve area (Vmax):   2.72 cm^2. Valve area (Vmean): 2.71 cm^2. - Right ventricle: The cavity size was mildly dilated. - Atrial septum: No defect or patent foramen ovale was identified. - Technically adequate study.  Antimicrobials:   None    Subjective: Breathing improving. Nausea improving, still has some abdominal pain  Objective: Vitals:   06/08/16 2054 06/08/16 2206 06/09/16 0500 06/09/16 0600  BP:  (!) 101/57 98/60   Pulse:  79 78   Resp:  16 14   Temp:  98.5 F (36.9 C) 98.3 F (36.8 C)   TempSrc:  Oral Oral   SpO2: 98% 96% 98%   Weight:    103 kg (227 lb 1.6 oz)  Height:        Intake/Output Summary (Last 24 hours) at 06/09/16  0715 Last data filed at 06/09/16 0515  Gross per 24 hour  Intake                0 ml  Output             1350 ml  Net            -1350 ml   Filed Weights   06/07/16 1530 06/08/16 1545 06/09/16 0600  Weight: 103 kg (227 lb 1.2 oz) 103.3 kg (227 lb 11.8 oz) 103 kg (227 lb 1.6 oz)     Examination:  General exam: Appears calm and comfortable  Respiratory system: Clear to auscultation. Respiratory effort normal. Cardiovascular system: S1 & S2 heard, RRR. No JVD,  murmurs, rubs, gallops or clicks. 1+ pedal edema. Gastrointestinal system: Abdomen is nondistended, soft and nontender. No organomegaly or masses felt. Normal bowel sounds heard. Central nervous system: Alert and oriented. No focal neurological deficits. Extremities: Symmetric 5 x 5 power. Bilateral LE wrapped in ACE dressings Skin: No rashes, lesions or ulcers Psychiatry: Judgement and insight appear normal. Mood & affect appropriate.     Data Reviewed: I have personally reviewed following labs and imaging studies  CBC:  Recent Labs Lab 06/07/16 0415 06/07/16 1140  WBC 6.0 7.4  NEUTROABS 4.6  --   HGB 10.9* 10.9*  HCT 37.6* 37.5*  MCV 67.1* 67.4*  PLT 366 703   Basic Metabolic Panel:  Recent Labs Lab 06/07/16 0415 06/07/16 1140 06/08/16 0607 06/09/16 0558  NA 135  --  136 136  K 3.7  --  4.0 3.8  CL 101  --  96* 92*  CO2 29  --  36* 34*  GLUCOSE 132*  --  99 66  BUN 8  --  9 21*  CREATININE 1.12 1.08 1.14 1.40*  CALCIUM 8.3*  --  8.3* 8.4*   GFR: Estimated Creatinine Clearance: 64.2 mL/min (by C-G formula based on SCr of 1.4 mg/dL). Liver Function Tests:  Recent Labs Lab 06/07/16 0415  AST 13*  ALT 8*  ALKPHOS 77  BILITOT 0.3  PROT 8.7*  ALBUMIN 3.3*    Recent Labs Lab 06/07/16 0415  LIPASE 18   No results for input(s): AMMONIA in the last 168 hours. Coagulation Profile: No results for input(s): INR, PROTIME in the last 168 hours. Cardiac Enzymes:  Recent Labs Lab 06/07/16 0415 06/07/16 1140 06/07/16 1636 06/07/16 2255  TROPONINI <0.03 <0.03 <0.03 <0.03   BNP (last 3 results) No results for input(s): PROBNP in the last 8760 hours. HbA1C: No results for input(s): HGBA1C in the last 72 hours. CBG: No results for input(s): GLUCAP in the last 168 hours. Lipid Profile: No results for input(s): CHOL, HDL, LDLCALC, TRIG, CHOLHDL, LDLDIRECT in the last 72 hours. Thyroid Function Tests: No results for input(s): TSH, T4TOTAL, FREET4, T3FREE,  THYROIDAB in the last 72 hours. Anemia Panel:  Recent Labs  06/07/16 1140  VITAMINB12 146*  FOLATE 6.7  FERRITIN 37  TIBC 454*  IRON 18*  RETICCTPCT 0.9   Sepsis Labs: No results for input(s): PROCALCITON, LATICACIDVEN in the last 168 hours.  Recent Results (from the past 240 hour(s))  MRSA PCR Screening     Status: None   Collection Time: 06/07/16  1:22 PM  Result Value Ref Range Status   MRSA by PCR NEGATIVE NEGATIVE Final    Comment:        The GeneXpert MRSA Assay (FDA approved for NASAL specimens only), is one component of a comprehensive MRSA  colonization surveillance program. It is not intended to diagnose MRSA infection nor to guide or monitor treatment for MRSA infections.          Radiology Studies: US Venous Img Lower Unilateral Left  Result Date: 06/07/2016 CLINICAL DATA:  Left lower extremity swelling. Short of breath for 3 weeks. EXAM: LEFT LOWER EXTREMITY VENOUS DUPLEX ULTRASOUND TECHNIQUE: Doppler venous assessment of the left lower extremity deep venous system was performed, including characterization of spectral flow, compressibility, and phasicity. COMPARISON:  05/15/2005 FINDINGS: There is complete compressibility of the left common femoral, femoral, and popliteal veins. Doppler analysis demonstrates respiratory phasicity and augmentation of flow with calf compression. No obvious superficial vein or calf vein thrombosis. Abnormally enlarged left inguinal lymph nodes are present. One measures up to 4.6 cm in the other measures up to 5.3 cm. IMPRESSION: No evidence of left lower extremity DVT. Abnormal left inguinal adenopathy. Differential diagnosis includes inflammatory and malignant etiology. Correlate clinically for the need for further imaging. Electronically Signed   By: Marybelle Killings M.D.   On: 06/07/2016 12:41        Scheduled Meds: . aspirin EC  81 mg Oral Daily  . carvedilol  6.25 mg Oral BID WC  . cyanocobalamin  1,000 mcg Intramuscular  Daily  . furosemide  40 mg Intravenous BID  . heparin  5,000 Units Subcutaneous Q8H  . hyoscyamine  0.125 mg Sublingual QID  . lisinopril  5 mg Oral Daily  . ondansetron (ZOFRAN) IV  4 mg Intravenous Q6H  . ondansetron (ZOFRAN) IV  4 mg Intravenous Q6H  . sodium chloride flush  3 mL Intravenous Q12H   Continuous Infusions:    LOS: 2 days    Time spent: 25 minutes    Kathie Dike, MD [facility] If 7PM-7AM, please contact night-coverage [email or web address] Password [facility] 06/09/2016, 7:15 AM

## 2016-06-09 NOTE — Progress Notes (Signed)
Subjective: Feeling better today. Nausea much better controlled on scheduled antiemetics. Pain much improved. Mild intermittent nausea since yesterday, no further vomiting. Has not tried eating anything but would like to try now that nausea is improved. No other upper or lower GI complaints.  Objective: Vital signs in last 24 hours: Temp:  [98.3 F (36.8 C)-98.5 F (36.9 C)] 98.3 F (36.8 C) (08/11 0500) Pulse Rate:  [75-86] 78 (08/11 0500) Resp:  [14-20] 14 (08/11 0500) BP: (98-121)/(57-64) 98/60 (08/11 0500) SpO2:  [96 %-100 %] 98 % (08/11 0500) Weight:  [227 lb 1.6 oz (103 kg)-227 lb 11.8 oz (103.3 kg)] 227 lb 1.6 oz (103 kg) (08/11 0600) Last BM Date: 06/07/16 General:   Alert and oriented, pleasant Head:  Normocephalic and atraumatic. Eyes:  No icterus, sclera clear. Conjuctiva pink.  Heart:  S1, S2 present, no murmurs noted.  Lungs: Clear to auscultation bilaterally, without wheezing, rales, or rhonchi.  Abdomen:  Bowel sounds present, soft, non-distended. Mild generalized TTP. No HSM or hernias noted. No rebound or guarding. No masses appreciated  Msk:  Symmetrical without gross deformities. Pulses:  Unable to assess DP pulses due to bilateral leg wraps. Neurologic:  Alert and  oriented x4;  grossly normal neurologically. Skin:  Bilateral LE leg wraps in place clean, dry, and intact.  Psych:  Alert and cooperative. Normal mood and affect.  Intake/Output from previous day: 08/10 0701 - 08/11 0700 In: -  Out: 1350 [Urine:1350] Intake/Output this shift: No intake/output data recorded.  Lab Results:  Recent Labs  06/07/16 0415 06/07/16 1140  WBC 6.0 7.4  HGB 10.9* 10.9*  HCT 37.6* 37.5*  PLT 366 355   BMET  Recent Labs  06/07/16 0415 06/07/16 1140 06/08/16 0607 06/09/16 0558  NA 135  --  136 136  K 3.7  --  4.0 3.8  CL 101  --  96* 92*  CO2 29  --  36* 34*  GLUCOSE 132*  --  99 66  BUN 8  --  9 21*  CREATININE 1.12 1.08 1.14 1.40*  CALCIUM 8.3*  --   8.3* 8.4*   LFT  Recent Labs  06/07/16 0415  PROT 8.7*  ALBUMIN 3.3*  AST 13*  ALT 8*  ALKPHOS 77  BILITOT 0.3   PT/INR No results for input(s): LABPROT, INR in the last 72 hours. Hepatitis Panel No results for input(s): HEPBSAG, HCVAB, HEPAIGM, HEPBIGM in the last 72 hours.   Studies/Results: US Venous Img Lower Unilateral Left  Result Date: 06/07/2016 CLINICAL DATA:  Left lower extremity swelling. Short of breath for 3 weeks. EXAM: LEFT LOWER EXTREMITY VENOUS DUPLEX ULTRASOUND TECHNIQUE: Doppler venous assessment of the left lower extremity deep venous system was performed, including characterization of spectral flow, compressibility, and phasicity. COMPARISON:  05/15/2005 FINDINGS: There is complete compressibility of the left common femoral, femoral, and popliteal veins. Doppler analysis demonstrates respiratory phasicity and augmentation of flow with calf compression. No obvious superficial vein or calf vein thrombosis. Abnormally enlarged left inguinal lymph nodes are present. One measures up to 4.6 cm in the other measures up to 5.3 cm. IMPRESSION: No evidence of left lower extremity DVT. Abnormal left inguinal adenopathy. Differential diagnosis includes inflammatory and malignant etiology. Correlate clinically for the need for further imaging. Electronically Signed   By: Marybelle Killings M.D.   On: 06/07/2016 12:41    Assessment: Abdominal pain/emesis: 60 year old male with sudden onset periumbilical/LLQ abdominal pain with nausea and vomiting, poor po intake several hours  after eating out at a restaurant about 1 week ago. No severe findings on exam. CT unremarkable, labs unremarkable. Likely gastroenteritis, food borne vs viral. His Zofran was scheduled, Phenergan prn, added hyoscyamine SL every 6 hours x 4 doses.  Today his BMP with increase in Cr to 1.40 from 1.14 (baseline 1.0-1.5 since 2012) with associated bump in BUN. Other electrolytes normal/stable.   Today he is  symptomatically improved from GI perspective. Much improved nausea, no further vomiting, abdominal pain improved with only mild tenderness on physical exam. Is willing to try clear liquids now that symptoms are better.   Plan: 1. Trial clear liquid diet today 2. Continued scheduled and prn antiemetics 3. Continue hyoscyamine as ordered 4. Continued supportive measures 5. Monitor for overt GI bleed in the setting of microcytic anemia 6. Likely endoscopic evaluation for anemia and CRC screening as outpatient    Thank you for allowing [place] to participate in the care of Veva Holes, DNP, AGNP-C Adult & Gerontological Nurse Practitioner [facility]      LOS: 2 days    06/09/2016, 8:09 AM

## 2016-06-09 NOTE — Care Management Note (Signed)
Case Management Note  Patient Details  Name: GABRIAN HOQUE MRN: [id number] Date of Birth: September 19, 1956  Subjective/Objective:   Patient is from home, states he walks without cane or walker. He has a PCP and insurance, reports no issues.                  Action/Plan: Anticipate DC home with HH, will order HHRN. Patient offered choice. Romualdo Bolk of [facility] notified and will obtain orders from chart. Patient made aware [facility] has 48 hours to make first visit.    Expected Discharge Date:                  Expected Discharge Plan:  [place]  In-House Referral:  NA  Discharge planning Services  CM Consult  Post Acute Care Choice:  Home Health Choice offered to:  Patient  DME Arranged:    DME Agency:     HH Arranged:  RN [place] Agency:  [place]  Status of Service:  In process, will continue to follow  If discussed at Long Length of Stay Meetings, dates discussed:    Additional Comments:  Oiva Dibari, Chauncey Reading, RN 06/09/2016, 8:31 AM

## 2016-06-09 NOTE — Care Management Important Message (Signed)
Important Message  Patient Details  Name: James Zamora MRN: [id number] Date of Birth: 12-17-1955   Medicare Important Message Given:  Yes    Hunt Zajicek, Chauncey Reading, RN 06/09/2016, 12:25 PM

## 2016-06-10 ENCOUNTER — Inpatient Hospital Stay (HOSPITAL_COMMUNITY): Payer: Medicare Other

## 2016-06-10 LAB — BASIC METABOLIC PANEL
Anion gap: 6 (ref 5–15)
BUN: 20 mg/dL (ref 6–20)
CHLORIDE: 92 mmol/L — AB (ref 101–111)
CO2: 32 mmol/L (ref 22–32)
CREATININE: 1.41 mg/dL — AB (ref 0.61–1.24)
Calcium: 8 mg/dL — ABNORMAL LOW (ref 8.9–10.3)
GFR calc non Af Amer: 53 mL/min — ABNORMAL LOW (ref >60)
Glucose, Bld: 82 mg/dL (ref 65–99)
Potassium: 3.6 mmol/L (ref 3.5–5.1)
Sodium: 130 mmol/L — ABNORMAL LOW (ref 135–145)

## 2016-06-10 NOTE — Progress Notes (Signed)
Nasal Canula removed - pt noted to have gradual desaturation into the 70's on room air. Nasal canula reapplied @ 3lpm, Pulse ox increased to 90%.

## 2016-06-10 NOTE — Progress Notes (Signed)
PROGRESS NOTE    James Zamora  YKZ:[id number] DOB: 19-Feb-1956 DOA: 06/07/2016 PCP: Maggie Font, MD    Brief Narrative:  45 yom with a hx of asthma presents with complaints of emesis and intermittent abdominal pain. He also stated he has worsening LE edema. On his left LE, it was noted that he has a large wound. This appears to be chronic and was present on 10/2015. On admission pt was noted to be hypertensive. CXR revealed cardiomegaly with diffuse pulmonary vascular congestion likely due to CHF. He has been admitted for further treatment of CHF. ECHO performed which showed EF of 60 - 65%. Creatinine has been trending up, so he has been transitioned to oral diuretics. He was noted to desat during attempted O2 weaning trial so will order repeat CXR to reevaluate.    Assessment & Plan:   Active Problems:   Acute congestive heart failure (HCC)   Emesis   Generalized abdominal pain   Microcytic anemia   Swelling  1. Acute diastolic CHF. CXR revealed cardiomegaly with diffuse pulmonary vascular congestion. ECHO showed EF of 60-65%. He's had good diuresis with IV lasix and is -5.9L since admission. With creatinine trending up, he has been transitioned to oral diuretics. Will continue beta blockers and low-dose ACEi. He was noted to desat after during attempted oxygen weaning trial. Will order repeat CXR to reevaluate. Follow up with cardiology as an outpatient.  2. Nausea/vomitting/abdominal pain. ? related to post infectious IBS. Ct of abd/pelvis was unrevealing. Liver function tests were normal, treat supportively for now. Appreciate GI input. Started on scheduled zofran and prn levsin. Advancing diet as tolerated. Continue to monitor. 3. HTN. He is on beta blockers and ACEi. Currently stable. 4. Chronic left LE wound. Wound care consult appreciated. Does not appear to be actively infected. DVT was ruled out.  5. Inguinal lymphadenopathy. Appears to be a chronic issue. This can be further  workup as an outpatient. 6. Microcytic anemia. Anemia panel shows iron deficiency as well as interestingly B12 deficiency. Continue on supplementation. No evidence of bleeding at this time. Follow up with FOBT. Likely further work up with endoscopy and colonoscopy as an outpatient   DVT prophylaxis: Heparin Code Status: Full Family Communication: discussed with patient . No family present at bedside. Disposition Plan: Discharge home once improved    Consultants:   Wound care   GI  Procedures:   Echo  Study Conclusions  - Left ventricle: The cavity size was normal. Wall thickness was   increased in a pattern of moderate LVH. Systolic function was   normal. The estimated ejection fraction was in the range of 60%   to 65%. Wall motion was normal; there were no regional wall   motion abnormalities. Left ventricular diastolic function   parameters were normal. - Aortic valve: Valve area (VTI): 2.72 cm^2. Valve area (Vmax):   2.72 cm^2. Valve area (Vmean): 2.71 cm^2. - Right ventricle: The cavity size was mildly dilated. - Atrial septum: No defect or patent foramen ovale was identified. - Technically adequate study.  Antimicrobials:   None    Subjective: Feels well today. Stomach has been doing well. Reports vomiting recently, but has otherwise been fine. Breathing well and having bowel movements.   Objective: Vitals:   06/09/16 0940 06/09/16 1402 06/09/16 2045 06/10/16 0627  BP: 118/60 (!) 106/51 117/61 114/61  Pulse: 84 88 87 86  Resp:  '18 18 18  '$ Temp:  98.7 F (37.1 C) (!) 100.4 F (38  C) 99.9 F (37.7 C)  TempSrc:  Oral Oral Oral  SpO2:  99% 96% 97%  Weight:    102.6 kg (226 lb 3.1 oz)  Height:        Intake/Output Summary (Last 24 hours) at 06/10/16 0700 Last data filed at 06/10/16 7782  Gross per 24 hour  Intake                0 ml  Output              800 ml  Net             -800 ml   Filed Weights   06/08/16 1545 06/09/16 0600 06/10/16 0627    Weight: 103.3 kg (227 lb 11.8 oz) 103 kg (227 lb 1.6 oz) 102.6 kg (226 lb 3.1 oz)   Examination:  General exam: Appears calm and comfortable  Respiratory system: Coarse breath sound at bases. Respiratory effort normal. Cardiovascular system: S1 & S2 heard, RRR. No JVD, murmurs, rubs, gallops or clicks. 1+ pedal edema. Gastrointestinal system: Abdomen is nondistended, soft and nontender. No organomegaly or masses felt. Normal bowel sounds heard. Central nervous system: Alert and oriented. No focal neurological deficits. Extremities: Both lowe extremities are wrapped in ACE bandages. Skin: No rashes, lesions or ulcers Psychiatry: Judgement and insight appear normal. Mood & affect appropriate.    Data Reviewed: I have personally reviewed following labs and imaging studies  CBC:  Recent Labs Lab 06/07/16 0415 06/07/16 1140  WBC 6.0 7.4  NEUTROABS 4.6  --   HGB 10.9* 10.9*  HCT 37.6* 37.5*  MCV 67.1* 67.4*  PLT 366 423   Basic Metabolic Panel:  Recent Labs Lab 06/07/16 0415 06/07/16 1140 06/08/16 0607 06/09/16 0558  NA 135  --  136 136  K 3.7  --  4.0 3.8  CL 101  --  96* 92*  CO2 29  --  36* 34*  GLUCOSE 132*  --  99 66  BUN 8  --  9 21*  CREATININE 1.12 1.08 1.14 1.40*  CALCIUM 8.3*  --  8.3* 8.4*   GFR: Estimated Creatinine Clearance: 64 mL/min (by C-G formula based on SCr of 1.4 mg/dL). Liver Function Tests:  Recent Labs Lab 06/07/16 0415  AST 13*  ALT 8*  ALKPHOS 77  BILITOT 0.3  PROT 8.7*  ALBUMIN 3.3*    Recent Labs Lab 06/07/16 0415  LIPASE 18   Cardiac Enzymes:  Recent Labs Lab 06/07/16 0415 06/07/16 1140 06/07/16 1636 06/07/16 2255  TROPONINI <0.03 <0.03 <0.03 <0.03   Anemia Panel:  Recent Labs  06/07/16 1140  VITAMINB12 146*  FOLATE 6.7  FERRITIN 37  TIBC 454*  IRON 18*  RETICCTPCT 0.9   Sepsis Labs: No results for input(s): PROCALCITON, LATICACIDVEN in the last 168 hours.  Recent Results (from the past 240 hour(s))   MRSA PCR Screening     Status: None   Collection Time: 06/07/16  1:22 PM  Result Value Ref Range Status   MRSA by PCR NEGATIVE NEGATIVE Final    Comment:        The GeneXpert MRSA Assay (FDA approved for NASAL specimens only), is one component of a comprehensive MRSA colonization surveillance program. It is not intended to diagnose MRSA infection nor to guide or monitor treatment for MRSA infections.      Scheduled Meds: . aspirin EC  81 mg Oral Daily  . carvedilol  6.25 mg Oral BID WC  . cyanocobalamin  1,000 mcg  Intramuscular Daily  . heparin  5,000 Units Subcutaneous Q8H  . lisinopril  5 mg Oral Daily  . ondansetron (ZOFRAN) IV  4 mg Intravenous Q6H  . ondansetron (ZOFRAN) IV  4 mg Intravenous Q6H  . pantoprazole  40 mg Oral QAC breakfast  . sodium chloride flush  3 mL Intravenous Q12H   Continuous Infusions:    LOS: 3 days    Time spent: 25 minutes    Kathie Dike, MD [facility] If 7PM-7AM, please contact night-coverage [email or web address] Password [email or web address] 06/10/2016, 7:00 AM   By signing my name below, I, Delene Ruffini, attest that this documentation has been prepared under the direction and in the presence of Kathie Dike, MD. Electronically Signed: Delene Ruffini 06/10/16 2:05pm  I, Dr. Kathie Dike, personally performed the services described in this documentaiton. All medical record entries made by the scribe were at my direction and in my presence. I have reviewed the chart and agree that the record reflects my personal performance and is accurate and complete  Kathie Dike, MD, 06/10/2016 2:15 PM

## 2016-06-11 LAB — CBC
HCT: 38.8 % — ABNORMAL LOW (ref 39.0–52.0)
HEMOGLOBIN: 11.1 g/dL — AB (ref 13.0–17.0)
MCH: 19.5 pg — ABNORMAL LOW (ref 26.0–34.0)
MCHC: 28.6 g/dL — ABNORMAL LOW (ref 30.0–36.0)
MCV: 68.2 fL — AB (ref 78.0–100.0)
Platelets: 291 10*3/uL (ref 150–400)
RBC: 5.69 MIL/uL (ref 4.22–5.81)
RDW: 18.8 % — ABNORMAL HIGH (ref 11.5–15.5)
WBC: 4.8 10*3/uL (ref 4.0–10.5)

## 2016-06-11 LAB — BASIC METABOLIC PANEL
ANION GAP: 6 (ref 5–15)
BUN: 15 mg/dL (ref 6–20)
CALCIUM: 8.1 mg/dL — AB (ref 8.9–10.3)
CHLORIDE: 93 mmol/L — AB (ref 101–111)
CO2: 34 mmol/L — AB (ref 22–32)
Creatinine, Ser: 1.14 mg/dL (ref 0.61–1.24)
GFR calc non Af Amer: >60 mL/min (ref >60)
Glucose, Bld: 91 mg/dL (ref 65–99)
POTASSIUM: 3.8 mmol/L (ref 3.5–5.1)
Sodium: 133 mmol/L — ABNORMAL LOW (ref 135–145)

## 2016-06-11 MED ORDER — FUROSEMIDE 40 MG PO TABS
40.0000 mg | ORAL_TABLET | Freq: Every day | ORAL | Status: DC
  Administered 2016-06-11 – 2016-06-12 (×2): 40 mg via ORAL
  Filled 2016-06-11 (×2): qty 1

## 2016-06-11 NOTE — Progress Notes (Signed)
Pt ambulated on room air down hall approx 40 yards when he felt lightheaded.  Returned pt to room via [place].  O2 sat was 79% on room air.  Applied O2 via [place] at 2LPM and O2 sat rose to 95%.  Once pt was comfortable I removed O2 and his sat dropped to 88%.  Pt currently on 1L O2 and sat is 96%.

## 2016-06-11 NOTE — Progress Notes (Signed)
PROGRESS NOTE    James Zamora  JGG:[id number] DOB: [birth year] DOA: 06/07/2016 PCP: Maggie Font, MD    Brief Narrative:  34 yom with a hx of asthma presents with complaints of emesis and intermittent abdominal pain. He also stated he has worsening LE edema. On his left LE, it was noted that he has a large wound. This appears to be chronic and was present on 10/2015. On admission pt was noted to be hypertensive. CXR revealed cardiomegaly with diffuse pulmonary vascular congestion likely due to CHF. He has been admitted for further treatment of CHF. ECHO performed which showed EF of 60 - 65%. Since his admission, his BP has become stable. Creatinine has been trending up, so he has been transitioned to oral diuretics. He was noted to desat during attempted O2 weaning trial. Repeat CXR showed no acute issue. Likely discharge in AM   Assessment & Plan:   Active Problems:   Acute congestive heart failure (HCC)   Emesis   Generalized abdominal pain   Microcytic anemia   Swelling  1. Acute diastolic CHF. CXR revealed cardiomegaly with diffuse pulmonary vascular congestion. ECHO showed EF of 60-65%. He has had good diuresis with IV lasix and is -5.9L since admission. With creatinine trending up, he has been transitioned to oral diuretics. Will continue beta blockers and low-dose ACEi. He was noted to desat after during attempted oxygen weaning trial. Repeat CXR showed no acute issues. Follow up with cardiology as an outpatient.  2. Nausea/vomitting/abdominal pain. ? related to post infectious IBS. Ct of abd/pelvis was unrevealing. Liver function tests were normal, treat supportively for now. Appreciate GI input. Started on scheduled zofran and prn levsin. Advancing diet as tolerated. Continue to monitor. 3. HTN. He is on beta blockers and ACEi. Continues to remain stable. 4. Chronic left LE wound. Wound care consult appreciated. Does not appear to be actively infected. DVT was ruled out.   5. Inguinal lymphadenopathy. Appears to be a chronic issue. This can be further workup as an outpatient. 6. Microcytic anemia. Hgb remains mildly low. Anemia panel shows iron deficiency as well as interestingly B12 deficiency. Continue on supplementation. No evidence of bleeding at this time. Follow up with FOBT. Likely further work up with endoscopy and colonoscopy as an outpatient   DVT prophylaxis: Heparin Code Status: Full Family Communication: discussed with patient Disposition Plan: Discharge home once improved    Consultants:   Wound care   GI  Procedures:   Echo  Study Conclusions  - Left ventricle: The cavity size was normal. Wall thickness was   increased in a pattern of moderate LVH. Systolic function was   normal. The estimated ejection fraction was in the range of 60%   to 65%. Wall motion was normal; there were no regional wall   motion abnormalities. Left ventricular diastolic function   parameters were normal. - Aortic valve: Valve area (VTI): 2.72 cm^2. Valve area (Vmax):   2.72 cm^2. Valve area (Vmean): 2.71 cm^2. - Right ventricle: The cavity size was mildly dilated. - Atrial septum: No defect or patent foramen ovale was identified. - Technically adequate study.  Antimicrobials:   None    Subjective: Breathing is improving. Became hypoxic while ambulating and passed out.  Objective: Vitals:   06/10/16 1407 06/10/16 2303 06/11/16 0500 06/11/16 0551  BP: (!) 97/58 121/78  124/75  Pulse: 79 76  92  Resp: '18 18  18  '$ Temp: 98.9 F (37.2 C) 98.9 F (37.2 C)  98.5 F (  36.9 C)  TempSrc:  Oral  Oral  SpO2: 97% 98%  94%  Weight:   104.4 kg (230 lb 1.6 oz)   Height:        Intake/Output Summary (Last 24 hours) at 06/11/16 0650 Last data filed at 06/11/16 0550  Gross per 24 hour  Intake              960 ml  Output             2950 ml  Net            -1990 ml   Filed Weights   06/09/16 0600 06/10/16 0627 06/11/16 0500  Weight: 103 kg (227 lb  1.6 oz) 102.6 kg (226 lb 3.1 oz) 104.4 kg (230 lb 1.6 oz)   Examination:  General exam: Appears calm and comfortable  Respiratory system: Clear to auscultation. Respiratory effort normal. Cardiovascular system: S1 & S2 heard, RRR. No JVD, murmurs, rubs, gallops or clicks. No pedal edema. Gastrointestinal system: Abdomen is nondistended, soft and nontender. No organomegaly or masses felt. Normal bowel sounds heard. Central nervous system: Alert and oriented. No focal neurological deficits. Extremities: bilateral lower extremities wrapped in compression dressing Skin: large wound over anterior aspect of LLE. Psychiatry: Judgement and insight appear normal. Mood & affect appropriate.     Data Reviewed: I have personally reviewed following labs and imaging studies  CBC:  Recent Labs Lab 06/07/16 0415 06/07/16 1140  WBC 6.0 7.4  NEUTROABS 4.6  --   HGB 10.9* 10.9*  HCT 37.6* 37.5*  MCV 67.1* 67.4*  PLT 366 709   Basic Metabolic Panel:  Recent Labs Lab 06/07/16 0415 06/07/16 1140 06/08/16 0607 06/09/16 0558 06/10/16 0543  NA 135  --  136 136 130*  K 3.7  --  4.0 3.8 3.6  CL 101  --  96* 92* 92*  CO2 29  --  36* 34* 32  GLUCOSE 132*  --  99 66 82  BUN 8  --  9 21* 20  CREATININE 1.12 1.08 1.14 1.40* 1.41*  CALCIUM 8.3*  --  8.3* 8.4* 8.0*   GFR: Estimated Creatinine Clearance: 64.1 mL/min (by C-G formula based on SCr of 1.41 mg/dL). Liver Function Tests:  Recent Labs Lab 06/07/16 0415  AST 13*  ALT 8*  ALKPHOS 77  BILITOT 0.3  PROT 8.7*  ALBUMIN 3.3*    Recent Labs Lab 06/07/16 0415  LIPASE 18   Cardiac Enzymes:  Recent Labs Lab 06/07/16 0415 06/07/16 1140 06/07/16 1636 06/07/16 2255  TROPONINI <0.03 <0.03 <0.03 <0.03   Anemia Panel: No results for input(s): VITAMINB12, FOLATE, FERRITIN, TIBC, IRON, RETICCTPCT in the last 72 hours. Sepsis Labs: No results for input(s): PROCALCITON, LATICACIDVEN in the last 168 hours.  Recent Results (from the  past 240 hour(s))  MRSA PCR Screening     Status: None   Collection Time: 06/07/16  1:22 PM  Result Value Ref Range Status   MRSA by PCR NEGATIVE NEGATIVE Final    Comment:        The GeneXpert MRSA Assay (FDA approved for NASAL specimens only), is one component of a comprehensive MRSA colonization surveillance program. It is not intended to diagnose MRSA infection nor to guide or monitor treatment for MRSA infections.      Scheduled Meds: . aspirin EC  81 mg Oral Daily  . carvedilol  6.25 mg Oral BID WC  . cyanocobalamin  1,000 mcg Intramuscular Daily  . heparin  5,000 Units Subcutaneous  Q8H  . lisinopril  5 mg Oral Daily  . ondansetron (ZOFRAN) IV  4 mg Intravenous Q6H  . pantoprazole  40 mg Oral QAC breakfast  . sodium chloride flush  3 mL Intravenous Q12H   Continuous Infusions:    LOS: 4 days    Time spent: 25 minutes   Kathie Dike, MD [facility]  If 7PM-7AM, please contact night-coverage [email or web address] Password [email or web address] 06/11/2016, 6:50 AM

## 2016-06-11 NOTE — Progress Notes (Signed)
Sat on room air 70's , placed on 2lpm 86 increased to 3lpm

## 2016-06-11 NOTE — Progress Notes (Signed)
1430 Late entry:  Dressing to BLE removed per verbal order from Dr. Roderic Palau.  LLE wounds were cleansed and redressed w/Aquacel, ABD pads and Kerlix.  There were no open areas to RLE.

## 2016-06-12 ENCOUNTER — Encounter: Payer: Self-pay | Admitting: Nurse Practitioner

## 2016-06-12 ENCOUNTER — Telehealth: Payer: Self-pay | Admitting: Gastroenterology

## 2016-06-12 MED ORDER — LISINOPRIL 5 MG PO TABS: 5.0000 mg | ORAL_TABLET | Freq: Every day | ORAL | 1 refills | Status: DC

## 2016-06-12 MED ORDER — ONDANSETRON 4 MG PO TBDP: 4.0000 mg | ORAL_TABLET | Freq: Three times a day (TID) | ORAL | 0 refills | Status: DC | PRN

## 2016-06-12 MED ORDER — VITAMIN B-12 1000 MCG PO TABS: 1000.0000 ug | ORAL_TABLET | Freq: Every day | ORAL | 1 refills | Status: DC

## 2016-06-12 MED ORDER — FUROSEMIDE 40 MG PO TABS: 40.0000 mg | ORAL_TABLET | Freq: Every day | ORAL | 0 refills | Status: DC

## 2016-06-12 MED ORDER — CARVEDILOL 6.25 MG PO TABS: 6.2500 mg | ORAL_TABLET | Freq: Two times a day (BID) | ORAL | 1 refills | Status: DC

## 2016-06-12 MED ORDER — ASPIRIN 81 MG PO TBEC: 81.0000 mg | DELAYED_RELEASE_TABLET | Freq: Every day | ORAL | 1 refills | Status: AC

## 2016-06-12 NOTE — Progress Notes (Signed)
    Subjective: No diarrhea, no abdominal pain, no N/V. Tolerating soft diet.   Objective: Vital signs in last 24 hours: Temp:  [98.6 F (37 C)-98.8 F (37.1 C)] 98.7 F (37.1 C) (08/14 0235) Pulse Rate:  [79-95] 79 (08/14 0235) Resp:  [18-20] 20 (08/14 0235) BP: (117-120)/(62-77) 117/62 (08/14 0235) SpO2:  [96 %-100 %] 97 % (08/14 0235) Weight:  [229 lb 12.8 oz (104.2 kg)] 229 lb 12.8 oz (104.2 kg) (08/14 0700) Last BM Date: 06/10/16 General:   Alert and oriented, pleasant Head:  Normocephalic and atraumatic. Eyes:  No icterus, sclera clear. Conjuctiva pink.  Abdomen:  Bowel sounds present, round but soft.  Extremities:  Without  edema. Neurologic:  Alert and  oriented x4 Psych:  Alert and cooperative. Normal mood and affect.  Intake/Output from previous day: 08/13 0701 - 08/14 0700 In: 960 [P.O.:960] Out: 2000 [Urine:2000] Intake/Output this shift: No intake/output data recorded.  Lab Results:  Recent Labs  06/11/16 0554  WBC 4.8  HGB 11.1*  HCT 38.8*  PLT 291   BMET  Recent Labs  06/10/16 0543 06/11/16 0554  NA 130* 133*  K 3.6 3.8  CL 92* 93*  CO2 32 34*  GLUCOSE 82 91  BUN 20 15  CREATININE 1.41* 1.14  CALCIUM 8.0* 8.1*   Lab Results  Component Value Date   IRON 18 (L) 06/07/2016   TIBC 454 (H) 06/07/2016   FERRITIN 37 06/07/2016    Studies/Results: Dg Chest 2 View  Result Date: 06/10/2016 CLINICAL DATA:  Patient with shortness of breath. Worsening lower extremity edema. EXAM: CHEST  2 VIEW COMPARISON:  Chest radiograph 06/07/2016 FINDINGS: Monitoring leads overlie the patient. Stable cardiac and mediastinal contours. Low lung volumes. No large area of pulmonary consolidation. No pleural effusion or pneumothorax. Regional skeleton is unremarkable. Cholecystectomy clips. IMPRESSION: No acute cardiopulmonary process. Electronically Signed   By: Lovey Newcomer M.D.   On: 06/10/2016 15:42    Assessment: 60 year old male admitted with abdominal  pain, nausea and vomiting, felt to be dealing with gastroenteritis. Clinically improved from admission, tolerating soft diet.   Anemia: multifactorial without overt GI bleeding. Will need outpatient colonoscopy+/- endoscopy.   Plan: Will arrange outpatient follow-up in our office for further evaluation. Will need at a minimum colonoscopy +/- endoscopy Signing off  Orvil Feil, ANP-BC [facility]    LOS: 5 days    06/12/2016, 8:05 AM   GI attending note Discuss with Ms. Laban Emperor. Patient was discharged before he could be seen.

## 2016-06-12 NOTE — Telephone Encounter (Signed)
APPT MADE AND LETTER SENT  °

## 2016-06-12 NOTE — Progress Notes (Signed)
Pt& sister given &discussed discharge instuctions & RX's

## 2016-06-12 NOTE — Progress Notes (Signed)
Iv dc'd [id number]

## 2016-06-12 NOTE — Progress Notes (Signed)
Pt placed on O2@ 2 liters & taken to family vehicle via wheelchair.

## 2016-06-12 NOTE — Progress Notes (Signed)
SATURATION QUALIFICATIONS: (This note is used to comply with regulatory documentation for home oxygen)  Patient Saturations on Room Air at Rest = 77%  Patient Saturations on Room Air while Ambulating =%  Patient Saturations on 2 Liters of oxygen while Ambulating = 95%  Please briefly explain why patient needs home oxygen:

## 2016-06-12 NOTE — Care Management Note (Signed)
Case Management Note  Patient Details  Name: DMARI SCHUBRING MRN: [id number] Date of Birth: 11-18-1955  Expected Discharge Date:    06/12/2016              Expected Discharge Plan:  [place]  In-House Referral:  NA  Discharge planning Services  CM Consult  Post Acute Care Choice:  Home Health Choice offered to:  Patient  DME Arranged:  Oxygen DME Agency:  [place]:  RN [facility] Agency:  [place]  Status of Service:  Completed, signed off  If discussed at [place] of Stay Meetings, dates discussed:    Additional Comments: Pt discharging home today with [facility] services through [facility]. Pt also meets requirements for supplemental home oxygen. Blake Divine, of [facility], made aware of referral and will obtain pt info from chart. Pt will have port oxygen tank delivered to pt home prior to DC.  Sherald Barge, RN 06/12/2016, 2:45 PM

## 2016-06-12 NOTE — Discharge Summary (Signed)
Physician Discharge Summary  James Zamora:[id number] DOB: [birth year] DOA: 06/07/2016  PCP: Maggie Font, MD  Admit date: 06/07/2016 Discharge date: 06/12/2016  Admitted From: home Disposition:  home  Recommendations for Outpatient Follow-up:  1. Follow up with PCP in 1-2 weeks 2. Please obtain BMP/CBC in one week 3. Follow up with cardiology on 8/28 for further evaluation of heart failure 4. Patient will be discharged with supplemental oxygen 5. Consider outpatient hematology/oncology referral for inguinal lymphadenopathy. 6. Follow-up with gastroenterology for endoscopy/colonoscopy  Home Health:home health RN Equipment/Devices: oxygen 2L  Discharge Condition: improved CODE STATUS: full Diet recommendation: Heart Healthy   Brief/Interim Summary: This is a 60 y/o male presented to the hospital with abdominal pain, vomiting and shortness of breath.  Initial imaging indicated vascular congestion and evidence of CHF. He did have significant lower extremity edema. He was treated with intravenous Lasix with good urine output. Echocardiogram showed normal EF. He was started on beta blockers and ace inhibitors. His blood pressure has since improved in volume status is improving. Follow-up chest x-ray shows improvement of vascular congestion. He's been transitioned to oral Lasix and will have outpatient cardiology follow-up. He still becomes hypoxic on ambulation, but does not appear to be in any respiratory distress and is not tachypnea. We'll discharge him on supplemental oxygen for now which can be weaned off as an outpatient.  Patient had significant nausea, vomiting and abdominal pain. He recently had a gastroenteritis. He was seen by gastroenterology. CT scan of the abdomen and pelvis was unrevealing. He was treated with supportive treatment, antiemetics and antispasmodics. His diet was slowly advanced and he is feeling significantly improved. He is tolerating a solid diet. Patient was  noted to have a microcytic anemia and will follow up with GI for likely outpatient endoscopy and colonoscopy. Interestingly, he was found to have B-12 deficiency. He was started on B-12 injections and will be discharged on oral B-12.  He was found to have a left lower extremity wound which appears to be chronic. It did not appear to be actively infected. DVT was ruled out. He was seen by wound care and made recommendations. He will be discharged with home health RN for wound management.  Imaging of his abdomen had showed a inguinal lymphadenopathy. This appears to be a chronic issue. Would recommend outpatient heme/oncology workup.    Discharge Diagnoses:  Active Problems:   Acute diastolic CHF (congestive heart failure) (HCC)   Emesis   Generalized abdominal pain   Microcytic anemia   Swelling    Discharge Instructions  Discharge Instructions    Diet - low sodium heart healthy    Complete by:  As directed   Increase activity slowly    Complete by:  As directed       Medication List    TAKE these medications   acetaminophen 500 MG tablet Commonly known as:  TYLENOL Take 1,000 mg by mouth every 6 (six) hours as needed for mild pain or headache.   aspirin 81 MG EC tablet Take 1 tablet (81 mg total) by mouth daily.   carvedilol 6.25 MG tablet Commonly known as:  COREG Take 1 tablet (6.25 mg total) by mouth 2 (two) times daily with a meal.   furosemide 40 MG tablet Commonly known as:  LASIX Take 1 tablet (40 mg total) by mouth daily.   lisinopril 5 MG tablet Commonly known as:  PRINIVIL,ZESTRIL Take 1 tablet (5 mg total) by mouth daily.   ondansetron 4 MG  disintegrating tablet Commonly known as:  ZOFRAN ODT Take 1 tablet (4 mg total) by mouth every 8 (eight) hours as needed for nausea or vomiting.   vitamin B-12 1000 MCG tablet Commonly known as:  CYANOCOBALAMIN Take 1 tablet (1,000 mcg total) by mouth daily.      Follow-up Information    [name], MD Follow  up on 06/26/2016.   Specialty:  Cardiology Why:  at 1:20 pm Contact information: [place] [address] [phone]        Maggie Font, MD. Schedule an appointment as soon as possible for a visit in 2 week(s).   Specialty:  Family Medicine Contact information: [address] [place] [address] [phone]          Allergies  Allergen Reactions  . Nutritional Supplements Hives and Swelling  . Penicillins     Pt has tolerated cephalosporins in the past Has patient had a PCN reaction causing immediate rash, facial/tongue/throat swelling, SOB or lightheadedness with hypotension: unknown Has patient had a PCN reaction causing severe rash involving mucus membranes or skin necrosis: unknown Has patient had a PCN reaction that required hospitalization: unknown Has patient had a PCN reaction occurring within the last 10 years: unknown If all of the above answers are "NO", then may proceed with Cephalosporin [place]    Consultations:  Gastroenterology   Procedures/Studies: Dg Chest 2 View  Result Date: 06/10/2016 CLINICAL DATA:  Patient with shortness of breath. Worsening lower extremity edema. EXAM: CHEST  2 VIEW COMPARISON:  Chest radiograph 06/07/2016 FINDINGS: Monitoring leads overlie the patient. Stable cardiac and mediastinal contours. Low lung volumes. No large area of pulmonary consolidation. No pleural effusion or pneumothorax. Regional skeleton is unremarkable. Cholecystectomy clips. IMPRESSION: No acute cardiopulmonary process. Electronically Signed   By: Lovey Newcomer M.D.   On: 06/10/2016 15:42   Ct Abdomen Pelvis W Contrast  Result Date: 06/07/2016 CLINICAL DATA:  Acute onset of intermittent vomiting. Initial encounter. EXAM: CT ABDOMEN AND PELVIS WITH CONTRAST TECHNIQUE: Multidetector CT imaging of the abdomen and pelvis was performed using the standard protocol following bolus administration of intravenous contrast. CONTRAST:  110m  ISOVUE-300 IOPAMIDOL (ISOVUE-300) INJECTION 61% COMPARISON:  CT of the abdomen and pelvis performed 11/13/2015 FINDINGS: Minimal bibasilar atelectasis or scarring is noted. The liver and spleen are unremarkable in appearance. The patient is status post cholecystectomy, with clips noted at the gallbladder fossa. A 2.2 cm nodule is noted at the left adrenal gland, relatively stable from 2011 and likely benign. The pancreas and right adrenal gland are unremarkable. The kidneys are unremarkable in appearance. Mild prominence of the left renal pelvis is stable in appearance and reflects the patient's baseline. There is no evidence of hydronephrosis. No renal or ureteral stones are seen. No perinephric stranding is appreciated. No free fluid is identified. The small bowel is unremarkable in appearance. The stomach is within normal limits. No acute vascular abnormalities are seen. Minimal calcification is seen along the distal abdominal aorta and its branches. The appendix is not well characterized; there is no evidence of appendicitis. The colon is grossly unremarkable in appearance. The bladder is mildly distended. Mild soft tissue inflammation is noted about the bladder, concerning for cystitis. The prostate remains normal in size. No inguinal lymphadenopathy is seen. No acute osseous abnormalities are identified. IMPRESSION: 1. Mild soft tissue inflammation about the bladder raises concern for cystitis. 2. 2.2 cm left adrenal nodule is relatively stable from 2011 and likely  benign. Electronically Signed   By: Garald Balding M.D.   On: 06/07/2016 06:55   US Venous Img Lower Unilateral Left  Result Date: 06/07/2016 CLINICAL DATA:  Left lower extremity swelling. Short of breath for 3 weeks. EXAM: LEFT LOWER EXTREMITY VENOUS DUPLEX ULTRASOUND TECHNIQUE: Doppler venous assessment of the left lower extremity deep venous system was performed, including characterization of spectral flow, compressibility, and phasicity.  COMPARISON:  05/15/2005 FINDINGS: There is complete compressibility of the left common femoral, femoral, and popliteal veins. Doppler analysis demonstrates respiratory phasicity and augmentation of flow with calf compression. No obvious superficial vein or calf vein thrombosis. Abnormally enlarged left inguinal lymph nodes are present. One measures up to 4.6 cm in the other measures up to 5.3 cm. IMPRESSION: No evidence of left lower extremity DVT. Abnormal left inguinal adenopathy. Differential diagnosis includes inflammatory and malignant etiology. Correlate clinically for the need for further imaging. Electronically Signed   By: Marybelle Killings M.D.   On: 06/07/2016 12:41   Dg Chest Port 1 View  Result Date: 06/07/2016 CLINICAL DATA:  Initial evaluation for acute chest pain. EXAM: PORTABLE CHEST 1 VIEW COMPARISON:  Prior radiograph from 11/13/2015. FINDINGS: Cardiomegaly stable from prior. Mediastinal silhouette within normal limits. Lungs are hypoinflated. Diffuse pulmonary vascular congestion with indistinctness of the interstitial markings, suggestive of mild diffuse pulmonary edema. No definite pleural effusion. No focal infiltrates identified. No pneumothorax. No acute osseous abnormality. IMPRESSION: Cardiomegaly with diffuse pulmonary vascular congestion and indistinctness of the interstitial markings, suggestive of mild diffuse pulmonary edema. Electronically Signed   By: Jeannine Boga M.D.   On: 06/07/2016 05:53      Echo  Study Conclusions  - Left ventricle: The cavity size was normal. Wall thickness was increased in a pattern of moderate LVH. Systolic function was normal. The estimated ejection fraction was in the range of 60% to 65%. Wall motion was normal; there were no regional wall motion abnormalities. Left ventricular diastolic function parameters were normal. - Aortic valve: Valve area (VTI): 2.72 cm^2. Valve area (Vmax): 2.72 cm^2. Valve area (Vmean): 2.71  cm^2. - Right ventricle: The cavity size was mildly dilated. - Atrial septum: No defect or patent foramen ovale was identified. - Technically adequate study.   Subjective: Abdominal pain is improved. No vomiting. Shortness of breath better with oxygen  Discharge Exam: Vitals:   06/11/16 2153 06/12/16 0235  BP: 120/77 117/62  Pulse: 95 79  Resp: 20 20  Temp: 98.8 F (37.1 C) 98.7 F (37.1 C)   Vitals:   06/11/16 1500 06/11/16 2153 06/12/16 0235 06/12/16 0700  BP: 119/74 120/77 117/62   Pulse: 79 95 79   Resp: '18 20 20   '$ Temp: 98.6 F (37 C) 98.8 F (37.1 C) 98.7 F (37.1 C)   TempSrc:  Oral Oral   SpO2: 96% 100% 97%   Weight:    104.2 kg (229 lb 12.8 oz)  Height:        General: Pt is alert, awake, not in acute distress Cardiovascular: RRR, S1/S2 +, no rubs, no gallops Respiratory: CTA bilaterally, no wheezing, no rhonchi Abdominal: Soft, NT, ND, bowel sounds + Extremities: 1+edema, no cyanosis    The results of significant diagnostics from this hospitalization (including imaging, microbiology, ancillary and laboratory) are listed below for reference.     Microbiology: Recent Results (from the past 240 hour(s))  MRSA PCR Screening     Status: None   Collection Time: 06/07/16  1:22 PM  Result Value Ref Range Status  MRSA by PCR NEGATIVE NEGATIVE Final    Comment:        The GeneXpert MRSA Assay (FDA approved for NASAL specimens only), is one component of a comprehensive MRSA colonization surveillance program. It is not intended to diagnose MRSA infection nor to guide or monitor treatment for MRSA infections.      Labs: BNP (last 3 results)  Recent Labs  06/07/16 0415  BNP 16.1   Basic Metabolic Panel:  Recent Labs Lab 06/07/16 0415 06/07/16 1140 06/08/16 0607 06/09/16 0558 06/10/16 0543 06/11/16 0554  NA 135  --  136 136 130* 133*  K 3.7  --  4.0 3.8 3.6 3.8  CL 101  --  96* 92* 92* 93*  CO2 29  --  36* 34* 32 34*  GLUCOSE 132*  --   99 66 82 91  BUN 8  --  9 21* 20 15  CREATININE 1.12 1.08 1.14 1.40* 1.41* 1.14  CALCIUM 8.3*  --  8.3* 8.4* 8.0* 8.1*   Liver Function Tests:  Recent Labs Lab 06/07/16 0415  AST 13*  ALT 8*  ALKPHOS 77  BILITOT 0.3  PROT 8.7*  ALBUMIN 3.3*    Recent Labs Lab 06/07/16 0415  LIPASE 18   No results for input(s): AMMONIA in the last 168 hours. CBC:  Recent Labs Lab 06/07/16 0415 06/07/16 1140 06/11/16 0554  WBC 6.0 7.4 4.8  NEUTROABS 4.6  --   --   HGB 10.9* 10.9* 11.1*  HCT 37.6* 37.5* 38.8*  MCV 67.1* 67.4* 68.2*  PLT 366 355 291   Cardiac Enzymes:  Recent Labs Lab 06/07/16 0415 06/07/16 1140 06/07/16 1636 06/07/16 2255  TROPONINI <0.03 <0.03 <0.03 <0.03   BNP: Invalid input(s): POCBNP CBG: No results for input(s): GLUCAP in the last 168 hours. D-Dimer No results for input(s): DDIMER in the last 72 hours. Hgb A1c No results for input(s): HGBA1C in the last 72 hours. Lipid Profile No results for input(s): CHOL, HDL, LDLCALC, TRIG, CHOLHDL, LDLDIRECT in the last 72 hours. Thyroid function studies No results for input(s): TSH, T4TOTAL, T3FREE, THYROIDAB in the last 72 hours.  Invalid input(s): FREET3 Anemia work up No results for input(s): VITAMINB12, FOLATE, FERRITIN, TIBC, IRON, RETICCTPCT in the last 72 hours. Urinalysis    Component Value Date/Time   COLORURINE YELLOW 06/07/2016 0730   APPEARANCEUR CLEAR 06/07/2016 0730   LABSPEC 1.010 06/07/2016 0730   PHURINE 6.5 06/07/2016 0730   GLUCOSEU NEGATIVE 06/07/2016 0730   HGBUR TRACE (A) 06/07/2016 0730   BILIRUBINUR NEGATIVE 06/07/2016 0730   KETONESUR NEGATIVE 06/07/2016 0730   PROTEINUR NEGATIVE 06/07/2016 0730   UROBILINOGEN 0.2 01/16/2010 1955   NITRITE NEGATIVE 06/07/2016 0730   LEUKOCYTESUR NEGATIVE 06/07/2016 0730   Sepsis Labs Invalid input(s): PROCALCITONIN,  WBC,  LACTICIDVEN Microbiology Recent Results (from the past 240 hour(s))  MRSA PCR Screening     Status: None    Collection Time: 06/07/16  1:22 PM  Result Value Ref Range Status   MRSA by PCR NEGATIVE NEGATIVE Final    Comment:        The GeneXpert MRSA Assay (FDA approved for NASAL specimens only), is one component of a comprehensive MRSA colonization surveillance program. It is not intended to diagnose MRSA infection nor to guide or monitor treatment for MRSA infections.      Time coordinating discharge: Over 30 minutes  SIGNED:   Kathie Dike, MD  [facility] 06/12/2016, 1:26 PM Pager   If 7PM-7AM, please contact night-coverage [email or web address] Password  [email or web address]

## 2016-06-12 NOTE — Care Management Important Message (Signed)
Important Message  Patient Details  Name: James Zamora MRN: [id number] Date of Birth: [birth year]   Medicare Important Message Given:  Yes    Sherald Barge, RN 06/12/2016, 1:45 PM

## 2016-06-12 NOTE — Telephone Encounter (Signed)
Please arrange outpatient hospital follow-up with Randall Hiss or myself. Will need colonoscopy +/- EGD.   Orvil Feil, ANP-BC [facility]

## 2016-06-13 DIAGNOSIS — D649 Anemia, unspecified: Secondary | ICD-10-CM | POA: Diagnosis not present

## 2016-06-13 DIAGNOSIS — Z9981 Dependence on supplemental oxygen: Secondary | ICD-10-CM | POA: Diagnosis not present

## 2016-06-13 DIAGNOSIS — J45909 Unspecified asthma, uncomplicated: Secondary | ICD-10-CM | POA: Diagnosis not present

## 2016-06-13 DIAGNOSIS — I5030 Unspecified diastolic (congestive) heart failure: Secondary | ICD-10-CM | POA: Diagnosis not present

## 2016-06-13 DIAGNOSIS — Z7982 Long term (current) use of aspirin: Secondary | ICD-10-CM | POA: Diagnosis not present

## 2016-06-13 DIAGNOSIS — R238 Other skin changes: Secondary | ICD-10-CM | POA: Diagnosis not present

## 2016-06-14 DIAGNOSIS — Z7982 Long term (current) use of aspirin: Secondary | ICD-10-CM | POA: Diagnosis not present

## 2016-06-14 DIAGNOSIS — Z9981 Dependence on supplemental oxygen: Secondary | ICD-10-CM | POA: Diagnosis not present

## 2016-06-14 DIAGNOSIS — I5030 Unspecified diastolic (congestive) heart failure: Secondary | ICD-10-CM | POA: Diagnosis not present

## 2016-06-14 DIAGNOSIS — D649 Anemia, unspecified: Secondary | ICD-10-CM | POA: Diagnosis not present

## 2016-06-14 DIAGNOSIS — R238 Other skin changes: Secondary | ICD-10-CM | POA: Diagnosis not present

## 2016-06-14 DIAGNOSIS — J45909 Unspecified asthma, uncomplicated: Secondary | ICD-10-CM | POA: Diagnosis not present

## 2016-06-15 DIAGNOSIS — J45909 Unspecified asthma, uncomplicated: Secondary | ICD-10-CM | POA: Diagnosis not present

## 2016-06-15 DIAGNOSIS — Z7982 Long term (current) use of aspirin: Secondary | ICD-10-CM | POA: Diagnosis not present

## 2016-06-15 DIAGNOSIS — Z9981 Dependence on supplemental oxygen: Secondary | ICD-10-CM | POA: Diagnosis not present

## 2016-06-15 DIAGNOSIS — I5030 Unspecified diastolic (congestive) heart failure: Secondary | ICD-10-CM | POA: Diagnosis not present

## 2016-06-15 DIAGNOSIS — R238 Other skin changes: Secondary | ICD-10-CM | POA: Diagnosis not present

## 2016-06-15 DIAGNOSIS — D649 Anemia, unspecified: Secondary | ICD-10-CM | POA: Diagnosis not present

## 2016-06-19 DIAGNOSIS — J45909 Unspecified asthma, uncomplicated: Secondary | ICD-10-CM | POA: Diagnosis not present

## 2016-06-19 DIAGNOSIS — D649 Anemia, unspecified: Secondary | ICD-10-CM | POA: Diagnosis not present

## 2016-06-19 DIAGNOSIS — Z7982 Long term (current) use of aspirin: Secondary | ICD-10-CM | POA: Diagnosis not present

## 2016-06-19 DIAGNOSIS — R238 Other skin changes: Secondary | ICD-10-CM | POA: Diagnosis not present

## 2016-06-19 DIAGNOSIS — Z9981 Dependence on supplemental oxygen: Secondary | ICD-10-CM | POA: Diagnosis not present

## 2016-06-19 DIAGNOSIS — I5030 Unspecified diastolic (congestive) heart failure: Secondary | ICD-10-CM | POA: Diagnosis not present

## 2016-06-22 DIAGNOSIS — J45909 Unspecified asthma, uncomplicated: Secondary | ICD-10-CM | POA: Diagnosis not present

## 2016-06-22 DIAGNOSIS — R238 Other skin changes: Secondary | ICD-10-CM | POA: Diagnosis not present

## 2016-06-22 DIAGNOSIS — D649 Anemia, unspecified: Secondary | ICD-10-CM | POA: Diagnosis not present

## 2016-06-22 DIAGNOSIS — Z9981 Dependence on supplemental oxygen: Secondary | ICD-10-CM | POA: Diagnosis not present

## 2016-06-22 DIAGNOSIS — Z7982 Long term (current) use of aspirin: Secondary | ICD-10-CM | POA: Diagnosis not present

## 2016-06-22 DIAGNOSIS — I5030 Unspecified diastolic (congestive) heart failure: Secondary | ICD-10-CM | POA: Diagnosis not present

## 2016-06-26 ENCOUNTER — Encounter: Payer: Self-pay | Admitting: Internal Medicine

## 2016-06-26 ENCOUNTER — Ambulatory Visit (INDEPENDENT_AMBULATORY_CARE_PROVIDER_SITE_OTHER): Payer: Medicare Other | Admitting: Internal Medicine

## 2016-06-26 VITALS — BP 100/66 | HR 89 | Ht 68.0 in | Wt 228.0 lb

## 2016-06-26 DIAGNOSIS — R609 Edema, unspecified: Secondary | ICD-10-CM | POA: Diagnosis not present

## 2016-06-26 DIAGNOSIS — D508 Other iron deficiency anemias: Secondary | ICD-10-CM | POA: Diagnosis not present

## 2016-06-26 DIAGNOSIS — R0602 Shortness of breath: Secondary | ICD-10-CM | POA: Diagnosis not present

## 2016-06-26 DIAGNOSIS — G4730 Sleep apnea, unspecified: Secondary | ICD-10-CM

## 2016-06-26 DIAGNOSIS — R600 Localized edema: Secondary | ICD-10-CM

## 2016-06-26 NOTE — Patient Instructions (Addendum)
Your physician recommends that you schedule a follow-up appointment in: 4-6 weeks with Arnold Long NP   Your physician has recommended that you have a sleep study. This test records several body functions during sleep, including: brain activity, eye movement, oxygen and carbon dioxide blood levels, heart rate and rhythm, breathing rate and rhythm, the flow of air through your mouth and nose, snoring, body muscle movements, and chest and belly movement.     Get lab work today      Thank you for choosing [facility] !

## 2016-06-26 NOTE — Progress Notes (Signed)
Cardiology Office Note   Date:  06/26/2016   ID:  Kayman, Snuffer September 27, 1956, MRN [id number]  PCP:  Maggie Font, MD  Cardiologist:   [name], MD      Pt presents for eval of edema  History of Present Illness: James Zamora is a 60 y.o. male with a history of N/SOB, abdominal pain  Recently admitted to [facility] with evid of CHF   Edemia  CXR with CHF  Responed to IV lasix  Echo with normal LVEF  Rx b blocker and ACE inhibitor  BP improved    Pt not that talkative  Family member with him No CP  Brathing is OK  No dizziness  Edema improving   He had an injury in past to L leg and it has been swollen since thn He had not been watching diet (salt)  He is doing better now.      Outpatient Medications Prior to Visit  Medication Sig Dispense Refill  . acetaminophen (TYLENOL) 500 MG tablet Take 1,000 mg by mouth every 6 (six) hours as needed for mild pain or headache.    Marland Kitchen aspirin EC 81 MG EC tablet Take 1 tablet (81 mg total) by mouth daily. 30 tablet 1  . carvedilol (COREG) 6.25 MG tablet Take 1 tablet (6.25 mg total) by mouth 2 (two) times daily with a meal. 60 tablet 1  . furosemide (LASIX) 40 MG tablet Take 1 tablet (40 mg total) by mouth daily. 30 tablet 0  . lisinopril (PRINIVIL,ZESTRIL) 5 MG tablet Take 1 tablet (5 mg total) by mouth daily. 30 tablet 1  . ondansetron (ZOFRAN ODT) 4 MG disintegrating tablet Take 1 tablet (4 mg total) by mouth every 8 (eight) hours as needed for nausea or vomiting. 20 tablet 0  . vitamin B-12 (CYANOCOBALAMIN) 1000 MCG tablet Take 1 tablet (1,000 mcg total) by mouth daily. 30 tablet 1   No facility-administered medications prior to visit.      Allergies:   Nutritional supplements and Penicillins   Past Medical History:  Diagnosis Date  . Asthma   . Perforated bowel [facility])     Past Surgical History:  Procedure Laterality Date  . CHOLECYSTECTOMY       Social History:  The patient  reports that he has never smoked. He has  quit using smokeless tobacco. He reports that he does not drink alcohol or use drugs.   Family History:  The patient's family history includes Asthma in his father; Diabetes in his mother.    ROS:  Please see the history of present illness. All other systems are reviewed and  Negative to the above problem except as noted.    PHYSICAL EXAM: VS:  Ht '5\' 8"'$  (1.727 m)   Wt 228 lb (103.4 kg)   BMI 34.67 kg/m   GEN: Well nourished, well developed, in no acute distress  HEENT: normal  Neck: no JVD, carotid bruits, or masses Cardiac: RRR; no murmurs, rubs, or gallops, 1-2+  edema  L leg  (wrapped)  Tr to 1+ RLE   Respiratory: Some decreased airflow  No rales  Wheezes with walking  GI: soft, nontender, nondistended, + BS  No hepatomegaly  MS: no deformity Moving all extremities   Skin: warm and dry, no rash Neuro:  Strength and sensation are intact Psych: euthymic mood, flat affect   EKG:  EKG is not ordered today.  On 8/7 SR 90   Lipid Panel No results found for: CHOL,  TRIG, HDL, CHOLHDL, VLDL, LDLCALC, LDLDIRECT    Wt Readings from Last 3 Encounters:  06/26/16 228 lb (103.4 kg)  06/12/16 229 lb 12.8 oz (104.2 kg)  11/13/15 224 lb 13.9 oz (102 kg)      ASSESSMENT AND PLAN:  1  Edema  VOlume is up some on exam Will repeat labs to determine furhter dosing  Watch salt  2  SOB  Initally O2 on room air was 95%  Walked 2 laps in clinic and stayed  With third went to 65% [place] oxygen  [facility] labs      Signed, [name], MD  06/26/2016 1:36 PM    [address], [place], [place]  [address] Phone: [phone]; Fax: [phone]

## 2016-06-27 ENCOUNTER — Telehealth: Payer: Self-pay | Admitting: Internal Medicine

## 2016-06-27 DIAGNOSIS — I5030 Unspecified diastolic (congestive) heart failure: Secondary | ICD-10-CM | POA: Diagnosis not present

## 2016-06-27 DIAGNOSIS — Z7982 Long term (current) use of aspirin: Secondary | ICD-10-CM | POA: Diagnosis not present

## 2016-06-27 DIAGNOSIS — Z9981 Dependence on supplemental oxygen: Secondary | ICD-10-CM | POA: Diagnosis not present

## 2016-06-27 DIAGNOSIS — R238 Other skin changes: Secondary | ICD-10-CM | POA: Diagnosis not present

## 2016-06-27 DIAGNOSIS — J45909 Unspecified asthma, uncomplicated: Secondary | ICD-10-CM | POA: Diagnosis not present

## 2016-06-27 DIAGNOSIS — I509 Heart failure, unspecified: Secondary | ICD-10-CM

## 2016-06-27 DIAGNOSIS — N179 Acute kidney failure, unspecified: Secondary | ICD-10-CM

## 2016-06-27 DIAGNOSIS — D649 Anemia, unspecified: Secondary | ICD-10-CM | POA: Diagnosis not present

## 2016-06-27 LAB — CBC
HEMATOCRIT: 39 % (ref 38.5–50.0)
HEMOGLOBIN: 11.3 g/dL — AB (ref 13.2–17.1)
MCH: 19.9 pg — AB (ref 27.0–33.0)
MCHC: 29 g/dL — AB (ref 32.0–36.0)
MCV: 68.8 fL — ABNORMAL LOW (ref 80.0–100.0)
Platelets: 312 10*3/uL (ref 140–400)
RBC: 5.67 MIL/uL (ref 4.20–5.80)
RDW: 20 % — AB (ref 11.0–15.0)
WBC: 4.8 10*3/uL (ref 3.8–10.8)

## 2016-06-27 LAB — BRAIN NATRIURETIC PEPTIDE: BRAIN NATRIURETIC PEPTIDE: 5.6 pg/mL (ref <100)

## 2016-06-27 LAB — COMPREHENSIVE METABOLIC PANEL
ALT: 9 U/L (ref 9–46)
AST: 14 U/L (ref 10–35)
Albumin: 3.6 g/dL (ref 3.6–5.1)
Alkaline Phosphatase: 72 U/L (ref 40–115)
BUN: 12 mg/dL (ref 7–25)
CHLORIDE: 98 mmol/L (ref 98–110)
CO2: 34 mmol/L — ABNORMAL HIGH (ref 20–31)
CREATININE: 1.24 mg/dL (ref 0.70–1.25)
Calcium: 9 mg/dL (ref 8.6–10.3)
GLUCOSE: 85 mg/dL (ref 65–99)
Potassium: 4.2 mmol/L (ref 3.5–5.3)
SODIUM: 138 mmol/L (ref 135–146)
TOTAL PROTEIN: 7.8 g/dL (ref 6.1–8.1)
Total Bilirubin: 0.3 mg/dL (ref 0.2–1.2)

## 2016-06-27 LAB — TSH: TSH: 0.58 m[IU]/L (ref 0.40–4.50)

## 2016-06-27 MED ORDER — POTASSIUM CHLORIDE ER 10 MEQ PO TBCR: EXTENDED_RELEASE_TABLET | ORAL | 3 refills | Status: DC

## 2016-06-27 MED ORDER — FUROSEMIDE 40 MG PO TABS: ORAL_TABLET | ORAL | 3 refills | Status: DC

## 2016-06-27 NOTE — Telephone Encounter (Signed)
Pt notified of lab results and med changes,e-scribed to [facility],maled lab slips to pt,copy to pcp

## 2016-06-27 NOTE — Telephone Encounter (Signed)
Sister confirmed they will fu with Dr Berdine Addison regarding his oxygen

## 2016-06-27 NOTE — Telephone Encounter (Signed)
Will route to Dr Harrington Challenger for advice

## 2016-06-27 NOTE — Telephone Encounter (Signed)
-----   Message from Fay Records, MD sent at 06/27/2016 10:42 AM EDT ----- Fluid was up on exam I would recomm increasing lasix to 60 mg every other day  40 on alternate days  Take 10 KCL when takes 60 (take every other day) Hgb is normal but would check ferritin, retic count in 2 wks   Also check BMET and BNP Thyroid function is normal Please make sure pt has f/u in pulmonary clinic

## 2016-07-04 DIAGNOSIS — R238 Other skin changes: Secondary | ICD-10-CM | POA: Diagnosis not present

## 2016-07-04 DIAGNOSIS — D649 Anemia, unspecified: Secondary | ICD-10-CM | POA: Diagnosis not present

## 2016-07-04 DIAGNOSIS — Z9981 Dependence on supplemental oxygen: Secondary | ICD-10-CM | POA: Diagnosis not present

## 2016-07-04 DIAGNOSIS — Z7982 Long term (current) use of aspirin: Secondary | ICD-10-CM | POA: Diagnosis not present

## 2016-07-04 DIAGNOSIS — I5030 Unspecified diastolic (congestive) heart failure: Secondary | ICD-10-CM | POA: Diagnosis not present

## 2016-07-04 DIAGNOSIS — J45909 Unspecified asthma, uncomplicated: Secondary | ICD-10-CM | POA: Diagnosis not present

## 2016-07-06 ENCOUNTER — Encounter: Payer: Self-pay | Admitting: Nurse Practitioner

## 2016-07-06 ENCOUNTER — Encounter: Payer: Self-pay | Admitting: Gastroenterology

## 2016-07-06 ENCOUNTER — Ambulatory Visit (INDEPENDENT_AMBULATORY_CARE_PROVIDER_SITE_OTHER): Payer: Medicare Other | Admitting: Nurse Practitioner

## 2016-07-06 VITALS — BP 114/75 | HR 86 | Temp 97.9°F | Ht 65.0 in | Wt 226.4 lb

## 2016-07-06 DIAGNOSIS — D509 Iron deficiency anemia, unspecified: Secondary | ICD-10-CM | POA: Diagnosis not present

## 2016-07-06 LAB — FERRITIN: FERRITIN: 75 ng/mL (ref 20–380)

## 2016-07-06 LAB — IRON: IRON: 34 ug/dL — AB (ref 50–180)

## 2016-07-06 NOTE — Progress Notes (Addendum)
REVIEWED-NO ADDITIONAL RECOMMENDATIONS.  Referring Provider: [name], MD Primary Care Physician:  Maggie Font, MD Primary GI:  Dr. Oneida Alar  Chief Complaint  Patient presents with  . Follow-up    HPI:   James Zamora is a 60 y.o. male who presents for hospital follow-up. He was admitted to the hospital On 03/07/2016 through 06/12/2016 for a few issues. Our service saw him in the hospital for right lower quadrant abdominal pain, emesis, microcytic anemia. He was deemed likely due to gastroenteritis as his symptoms began after eating out. He clinically improved related to this, no overt GI bleeding noted. Has never had a colonoscopy prior. His hospitalization hemoglobin ranged from 10.9-11.1 and was microcytic and hypochromic. Follow-up CBC on 06/26/2016 found hemoglobin continued depressed at 11.3, still microcytic and hypochromic. Ferritin was ordered for 06/27/2016 but does not appear to been completed.  Today he states he's doing well overall. No further abdominal pain, N/V since hospital discharge. Wears oxygen as needed/ambulation for CHF. Saw cardiology last week, still had some excessive fluid on board, diuretics adjusted; overall better. Denies hematochezia, melena, N/V, sudden changes in bowel habits, fever, chills, unintentional weight loss. Denies chest pain, dyspnea, dizziness, lightheadedness, syncope, near syncope. Denies any other upper or lower GI symptoms.  States he's still seeing wound clinic and was told his would is about resolved.  Patient has only been seen by Dr. Gala Romney in the hospital for gastroenteritis, no previous procedures completed. Patient is requesting change to Dr. Oneida Alar because his sister knows her well.  Past Medical History:  Diagnosis Date  . Asthma   . CHF (congestive heart failure) ([place])   . Perforated bowel [facility])     Past Surgical History:  Procedure Laterality Date  . CHOLECYSTECTOMY      Current Outpatient Prescriptions  Medication  Sig Dispense Refill  . acetaminophen (TYLENOL) 500 MG tablet Take 1,000 mg by mouth every 6 (six) hours as needed for mild pain or headache.    Marland Kitchen aspirin EC 81 MG EC tablet Take 1 tablet (81 mg total) by mouth daily. 30 tablet 1  . carvedilol (COREG) 6.25 MG tablet Take 1 tablet (6.25 mg total) by mouth 2 (two) times daily with a meal. 60 tablet 1  . furosemide (LASIX) 40 MG tablet Take 60 mg ( 1 1/2 pills) every OTHER  day, 40 mg (1 pill)  on alternate days 115 tablet 3  . lisinopril (PRINIVIL,ZESTRIL) 5 MG tablet Take 1 tablet (5 mg total) by mouth daily. 30 tablet 1  . potassium chloride (K-DUR) 10 MEQ tablet Take 10 meq (1 pill) when you take lasix 60 mg (take KCL every OTHER day) 45 tablet 3  . vitamin B-12 (CYANOCOBALAMIN) 1000 MCG tablet Take 1 tablet (1,000 mcg total) by mouth daily. 30 tablet 1   No current facility-administered medications for this visit.     Allergies as of 07/06/2016 - Review Complete 07/06/2016  Allergen Reaction Noted  . Nutritional supplements Hives and Swelling 06/12/2011  . Penicillins  06/12/2011    Family History  Problem Relation Age of Onset  . Diabetes Mother   . Asthma Father   . Colon cancer Neg Hx     Social History   Social History  . Marital status: Single    Spouse name: N/A  . Number of children: N/A  . Years of education: N/A   Social History Main Topics  . Smoking status: Former Smoker    Quit date: 07/06/2014  . Smokeless tobacco:  Former User    Types: Chew    Quit date: 07/06/2014  . Alcohol use No  . Drug use: No  . Sexual activity: Not Asked   Other Topics Concern  . None   Social History Narrative  . None    Review of Systems: 10-point ROS negative except as per HPI.   Physical Exam: BP 114/75   Pulse 86   Temp 97.9 F (36.6 C) (Oral)   Ht '5\' 5"'$  (1.651 m)   Wt 226 lb 6.4 oz (102.7 kg)   BMI 37.67 kg/m  General:   Alert and oriented. Pleasant and cooperative. Well-nourished and well-developed.  Head:   Normocephalic and atraumatic. Eyes:  Without icterus, sclera clear and conjunctiva pink.  Ears:  Normal auditory acuity. Cardiovascular:  S1, S2 present without murmurs appreciated. Extremities without clubbing or edema. Respiratory:  Clear to auscultation bilaterally. No wheezes, rales, or rhonchi. No distress. Wearing O2 2lpm per [place]. Gastrointestinal:  +BS, soft, non-tender and non-distended. No HSM noted. No guarding or rebound. No masses appreciated.  Rectal:  Deferred  Musculoskalatal:  Symmetrical without gross deformities. Skin:  Intact without significant lesions or rashes. Left LE wrapped in dressing, clean/dry/intact for history of leg wound. Neurologic:  Alert and oriented x4;  grossly normal neurologically. Psych:  Alert and cooperative. Normal mood and affect. Heme/Lymph/Immune: No excessive bruising noted.    07/06/2016 10:52 AM   Disclaimer: This note was dictated with voice recognition software. Similar sounding words can inadvertently be transcribed and may not be corrected upon review.

## 2016-07-06 NOTE — Assessment & Plan Note (Signed)
Patient with noted microcytic anemia, no iron studies done yet. We will check iron and ferritin today. He is never had a colonoscopy or upper endoscopy before. He will likely need colonoscopy and EGD. Potentially needs Givens capsule if no findings on endoscopic evaluation. Prior to procedure will need to request clearance from cardiology related to heart failure. He is on oxygen but states it is only as needed if he is up and moving around. Return for follow-up in 4 months.  Proceed with colonoscopy and EGD with Dr. Oneida Alar in the near future. The risks, benefits, and alternatives have been discussed in detail with the patient. They state understanding and desire to proceed.   The patient is not on any anticoagulants, anxiolytics, chronic pain medications, or antidepressants. Conscious sedation should be adequate for his procedure.

## 2016-07-06 NOTE — Patient Instructions (Signed)
1. Have your labs drawn when you're able to. 2. We will contact Dr. Alan Ripper office to request clearance for your procedures. 3. If your cleared, we will call to schedule your date over the phone. 4. Return for follow-up in 4 months.

## 2016-07-06 NOTE — Progress Notes (Signed)
cc'ed to pcp °

## 2016-07-07 NOTE — Progress Notes (Signed)
I spoke to Pt's sister, [name], who takes care of him and she is aware. Sending to [facility] to schedule when they hear from Dr. Harrington Challenger.

## 2016-07-11 DIAGNOSIS — J45909 Unspecified asthma, uncomplicated: Secondary | ICD-10-CM | POA: Diagnosis not present

## 2016-07-11 DIAGNOSIS — R238 Other skin changes: Secondary | ICD-10-CM | POA: Diagnosis not present

## 2016-07-11 DIAGNOSIS — Z7982 Long term (current) use of aspirin: Secondary | ICD-10-CM | POA: Diagnosis not present

## 2016-07-11 DIAGNOSIS — Z9981 Dependence on supplemental oxygen: Secondary | ICD-10-CM | POA: Diagnosis not present

## 2016-07-11 DIAGNOSIS — I5030 Unspecified diastolic (congestive) heart failure: Secondary | ICD-10-CM | POA: Diagnosis not present

## 2016-07-11 DIAGNOSIS — D649 Anemia, unspecified: Secondary | ICD-10-CM | POA: Diagnosis not present

## 2016-07-13 ENCOUNTER — Telehealth: Payer: Self-pay

## 2016-07-13 NOTE — Telephone Encounter (Signed)
-----   Message from Fay Records, MD sent at 07/13/2016  2:38 PM EDT ----- Patient is OK to proceed with GI testing   ----- Message ----- From: Marlou Porch, CMA Sent: 07/12/2016   8:48 AM To: Fay Records, MD  We saw him on 07/06/16 and he is needing to have a TCS/EGD and we need clearance for this. Please advise   thanks  Ginger

## 2016-07-17 ENCOUNTER — Other Ambulatory Visit: Payer: Self-pay

## 2016-07-17 DIAGNOSIS — D649 Anemia, unspecified: Secondary | ICD-10-CM

## 2016-07-17 MED ORDER — PEG 3350-KCL-NA BICARB-NACL 420 G PO SOLR: 4000.0000 mL | ORAL | 0 refills | Status: DC

## 2016-07-17 NOTE — Progress Notes (Signed)
Called and spoke to sister Coralee Pesa). TCS/EGD with [facility] scheduled for 07/31/16. Letter with instructions mailed to sister per her request.

## 2016-07-17 NOTE — Telephone Encounter (Signed)
Ok to schedule TCS and EGD w/ [facility] for anemia. Conscious sedation should be adequate per office note. Thanks!

## 2016-07-18 ENCOUNTER — Ambulatory Visit: Payer: Medicare Other | Attending: Internal Medicine | Admitting: Neurology

## 2016-07-18 DIAGNOSIS — Z9981 Dependence on supplemental oxygen: Secondary | ICD-10-CM | POA: Diagnosis not present

## 2016-07-18 DIAGNOSIS — Z7982 Long term (current) use of aspirin: Secondary | ICD-10-CM | POA: Insufficient documentation

## 2016-07-18 DIAGNOSIS — G4733 Obstructive sleep apnea (adult) (pediatric): Secondary | ICD-10-CM | POA: Diagnosis not present

## 2016-07-18 DIAGNOSIS — Z79899 Other long term (current) drug therapy: Secondary | ICD-10-CM | POA: Diagnosis not present

## 2016-07-18 DIAGNOSIS — G4730 Sleep apnea, unspecified: Secondary | ICD-10-CM | POA: Diagnosis present

## 2016-07-18 DIAGNOSIS — D649 Anemia, unspecified: Secondary | ICD-10-CM | POA: Diagnosis not present

## 2016-07-18 DIAGNOSIS — I5030 Unspecified diastolic (congestive) heart failure: Secondary | ICD-10-CM | POA: Diagnosis not present

## 2016-07-18 DIAGNOSIS — J45909 Unspecified asthma, uncomplicated: Secondary | ICD-10-CM | POA: Diagnosis not present

## 2016-07-18 DIAGNOSIS — G4761 Periodic limb movement disorder: Secondary | ICD-10-CM | POA: Diagnosis not present

## 2016-07-18 DIAGNOSIS — R238 Other skin changes: Secondary | ICD-10-CM | POA: Diagnosis not present

## 2016-07-18 NOTE — Telephone Encounter (Signed)
See office note

## 2016-07-25 DIAGNOSIS — J45909 Unspecified asthma, uncomplicated: Secondary | ICD-10-CM | POA: Diagnosis not present

## 2016-07-25 DIAGNOSIS — Z9981 Dependence on supplemental oxygen: Secondary | ICD-10-CM | POA: Diagnosis not present

## 2016-07-25 DIAGNOSIS — Z7982 Long term (current) use of aspirin: Secondary | ICD-10-CM | POA: Diagnosis not present

## 2016-07-25 DIAGNOSIS — R238 Other skin changes: Secondary | ICD-10-CM | POA: Diagnosis not present

## 2016-07-25 DIAGNOSIS — I5030 Unspecified diastolic (congestive) heart failure: Secondary | ICD-10-CM | POA: Diagnosis not present

## 2016-07-25 DIAGNOSIS — D649 Anemia, unspecified: Secondary | ICD-10-CM | POA: Diagnosis not present

## 2016-07-31 ENCOUNTER — Encounter (HOSPITAL_COMMUNITY): Payer: Self-pay | Admitting: *Deleted

## 2016-07-31 ENCOUNTER — Encounter (HOSPITAL_COMMUNITY)
Admission: RE | Disposition: A | Discharge status: Home / Self Care | Payer: Self-pay | Source: Ambulatory Visit | Attending: Gastroenterology

## 2016-07-31 ENCOUNTER — Ambulatory Visit (HOSPITAL_COMMUNITY)
Admission: RE | Admit: 2016-07-31 | Discharge: 2016-07-31 | Disposition: A | Discharge status: Home / Self Care | Payer: Medicare Other | Source: Ambulatory Visit | Attending: Gastroenterology | Admitting: Gastroenterology

## 2016-07-31 DIAGNOSIS — Z825 Family history of asthma and other chronic lower respiratory diseases: Secondary | ICD-10-CM | POA: Diagnosis not present

## 2016-07-31 DIAGNOSIS — Z9049 Acquired absence of other specified parts of digestive tract: Secondary | ICD-10-CM | POA: Insufficient documentation

## 2016-07-31 DIAGNOSIS — D509 Iron deficiency anemia, unspecified: Secondary | ICD-10-CM | POA: Insufficient documentation

## 2016-07-31 DIAGNOSIS — Z880 Allergy status to penicillin: Secondary | ICD-10-CM | POA: Insufficient documentation

## 2016-07-31 DIAGNOSIS — K621 Rectal polyp: Secondary | ICD-10-CM | POA: Insufficient documentation

## 2016-07-31 DIAGNOSIS — Z9102 Food additives allergy status: Secondary | ICD-10-CM | POA: Insufficient documentation

## 2016-07-31 DIAGNOSIS — I509 Heart failure, unspecified: Secondary | ICD-10-CM | POA: Diagnosis not present

## 2016-07-31 DIAGNOSIS — J45909 Unspecified asthma, uncomplicated: Secondary | ICD-10-CM | POA: Insufficient documentation

## 2016-07-31 DIAGNOSIS — Z87891 Personal history of nicotine dependence: Secondary | ICD-10-CM | POA: Insufficient documentation

## 2016-07-31 DIAGNOSIS — Z7982 Long term (current) use of aspirin: Secondary | ICD-10-CM | POA: Insufficient documentation

## 2016-07-31 DIAGNOSIS — K3189 Other diseases of stomach and duodenum: Secondary | ICD-10-CM | POA: Diagnosis not present

## 2016-07-31 DIAGNOSIS — K648 Other hemorrhoids: Secondary | ICD-10-CM | POA: Diagnosis not present

## 2016-07-31 DIAGNOSIS — K2970 Gastritis, unspecified, without bleeding: Secondary | ICD-10-CM | POA: Insufficient documentation

## 2016-07-31 DIAGNOSIS — K319 Disease of stomach and duodenum, unspecified: Secondary | ICD-10-CM | POA: Insufficient documentation

## 2016-07-31 DIAGNOSIS — D649 Anemia, unspecified: Secondary | ICD-10-CM | POA: Diagnosis not present

## 2016-07-31 DIAGNOSIS — D122 Benign neoplasm of ascending colon: Secondary | ICD-10-CM | POA: Diagnosis not present

## 2016-07-31 DIAGNOSIS — Z79899 Other long term (current) drug therapy: Secondary | ICD-10-CM | POA: Diagnosis not present

## 2016-07-31 DIAGNOSIS — Z833 Family history of diabetes mellitus: Secondary | ICD-10-CM | POA: Insufficient documentation

## 2016-07-31 DIAGNOSIS — Q438 Other specified congenital malformations of intestine: Secondary | ICD-10-CM | POA: Insufficient documentation

## 2016-07-31 DIAGNOSIS — D128 Benign neoplasm of rectum: Secondary | ICD-10-CM

## 2016-07-31 HISTORY — PX: ESOPHAGOGASTRODUODENOSCOPY: SHX5428

## 2016-07-31 HISTORY — PX: COLONOSCOPY: SHX5424

## 2016-07-31 SURGERY — COLONOSCOPY
Anesthesia: Moderate Sedation

## 2016-07-31 MED ORDER — SODIUM CHLORIDE 0.9 % IV SOLN
INTRAVENOUS | Status: DC
  Administered 2016-07-31: 14:00:00 via INTRAVENOUS

## 2016-07-31 MED ORDER — LIDOCAINE VISCOUS 2 % MT SOLN
OROMUCOSAL | Status: AC
  Filled 2016-07-31: qty 15

## 2016-07-31 MED ORDER — MEPERIDINE HCL 100 MG/ML IJ SOLN
INTRAMUSCULAR | Status: AC
  Filled 2016-07-31: qty 2

## 2016-07-31 MED ORDER — MIDAZOLAM HCL 5 MG/5ML IJ SOLN
INTRAMUSCULAR | Status: AC
  Filled 2016-07-31: qty 10

## 2016-07-31 MED ORDER — MEPERIDINE HCL 100 MG/ML IJ SOLN
INTRAMUSCULAR | Status: DC | PRN
  Administered 2016-07-31: 50 mg via INTRAVENOUS
  Administered 2016-07-31: 25 mg via INTRAVENOUS

## 2016-07-31 MED ORDER — MIDAZOLAM HCL 5 MG/5ML IJ SOLN
INTRAMUSCULAR | Status: DC | PRN
  Administered 2016-07-31: 2 mg via INTRAVENOUS
  Administered 2016-07-31: 1 mg via INTRAVENOUS
  Administered 2016-07-31: 2 mg via INTRAVENOUS

## 2016-07-31 NOTE — Op Note (Addendum)
[facility] Patient Name: James Zamora Procedure Date: 07/31/2016 2:47 PM MRN: [id number] Date of Birth: 02-Jul-1956 Attending MD: Barney Drain , MD CSN: [id number] Age: 60 Admit Type: Outpatient Procedure:                Upper GI endoscopy WITH COLD FORCEPS BIOPSY Indications:              Anemia Providers:                Barney Drain, MD, Lurline Del, RN, Randa Spike,                            Technician Referring MD:             Barrie Folk. Hill MD, MD Medicines:                Midazolam 2 mg IV Complications:            No immediate complications. Estimated Blood Loss:     Estimated blood loss was minimal. Procedure:                Pre-Anesthesia Assessment:                           - Prior to the procedure, a History and Physical                            was performed, and patient medications and                            allergies were reviewed. The patient's tolerance of                            previous anesthesia was also reviewed. The risks                            and benefits of the procedure and the sedation                            options and risks were discussed with the patient.                            All questions were answered, and informed consent                            was obtained. Prior Anticoagulants: The patient has                            taken aspirin, last dose was day of procedure. ASA                            Grade Assessment: II - A patient with mild systemic                            disease. After reviewing the risks and benefits,  the patient was deemed in satisfactory condition to                            undergo the procedure. After obtaining informed                            consent, the endoscope was passed under direct                            vision. Throughout the procedure, the patient's                            blood pressure, pulse, and oxygen saturations were              monitored continuously. The EG-[id number] ([id number])                            scope was introduced through the mouth, and                            advanced to the second part of duodenum. The upper                            GI endoscopy was accomplished with ease. The                            patient tolerated the procedure well. Scope In: 2:50:10 PM Scope Out: 2:59:41 PM Total Procedure Duration: 0 hours 9 minutes 31 seconds  Findings:      The examined esophagus was normal.      Scattered mild inflammation characterized by congestion (edema),       erosions and erythema was found in the gastric fundus, in the gastric       body and in the gastric antrum. Biopsies were taken with a cold forceps       for Helicobacter pylori testing.      The examined duodenum was normal. Impression:               - Normal esophagus.                           - Gastritis. Biopsied.                           - Moderate Sedation:      Moderate (conscious) sedation was administered by the endoscopy nurse       and supervised by the endoscopist. The following parameters were       monitored: oxygen saturation, heart rate, blood pressure, and response       to care. Total physician intraservice time was 58 minutes. Recommendation:           - High fiber diet.                           - Continue present medications.                           -  Await pathology results.                           - Return to my office in 3 months. NEEDS                            CBC/FERRITIN 1 WEEK PRIOR TO OPV.                           - SICKLE SCREEN TODAY.                           - Patient has a contact number available for                            emergencies. The signs and symptoms of potential                            delayed complications were discussed with the                            patient. Return to normal activities tomorrow.                            Written discharge instructions were  provided to the                            patient. Procedure Code(s):        --- Professional ---                           [phone], Esophagogastroduodenoscopy, flexible,                            transoral; with biopsy, single or multiple                           99152, Moderate sedation services provided by the                            same physician or other qualified health care                            professional performing the diagnostic or                            therapeutic service that the sedation supports,                            requiring the presence of an independent trained                            observer to assist in the monitoring of the  patient's level of consciousness and physiological                            status; initial 15 minutes of intraservice time,                            patient age 67 years or older                           [phone], Moderate sedation services; each additional                            15 minutes intraservice time                           99153, Moderate sedation services; each additional                            15 minutes intraservice time                           99153, Moderate sedation services; each additional                            15 minutes intraservice time Diagnosis Code(s):        --- Professional ---                           K29.70, Gastritis, unspecified, without bleeding                           D64.9, Anemia, unspecified CPT copyright 2016 American Medical Association. All rights reserved. The codes documented in this report are preliminary and upon coder review may  be revised to meet current compliance requirements. Barney Drain, MD Barney Drain, MD 07/31/2016 3:30:59 PM This report has been signed electronically. Number of Addenda: 0

## 2016-07-31 NOTE — Procedures (Signed)
[place] A. Merlene Laughter, MD     [email or web address]             NOCTURNAL POLYSOMNOGRAPHY   LOCATION: [facility]   Patient Name: James Zamora, James Zamora Date: 07/18/2016 Gender: Male D.O.B: Nov 06, 1955 Age (years): 60 Referring Provider: [name] Height (inches): 65 Interpreting Physician: Phillips Odor MD, ABSM Weight (lbs): 226 RPSGT: Rosebud Poles BMI: 38 MRN: [id number] Neck Size: 16.50 CLINICAL INFORMATION Sleep Study Type: NPSG Indication for sleep study: N/A Epworth Sleepiness Score: 11 SLEEP STUDY TECHNIQUE As per the AASM Manual for the Scoring of Sleep and Associated Events v2.3 (April 2016) with a hypopnea requiring 4% desaturations. The channels recorded and monitored were frontal, central and occipital EEG, electrooculogram (EOG), submentalis EMG (chin), nasal and oral airflow, thoracic and abdominal wall motion, anterior tibialis EMG, snore microphone, electrocardiogram, and pulse oximetry. MEDICATIONS Patient's medications include: N/A. Medications self-administered by patient during sleep study : No sleep medicine administered.  Current Outpatient Prescriptions:  .  acetaminophen (TYLENOL) 500 MG tablet, Take 1,000 mg by mouth every 6 (six) hours as needed for mild pain or headache., Disp: , Rfl:  .  aspirin EC 81 MG EC tablet, Take 1 tablet (81 mg total) by mouth daily., Disp: 30 tablet, Rfl: 1 .  carvedilol (COREG) 6.25 MG tablet, Take 1 tablet (6.25 mg total) by mouth 2 (two) times daily with a meal., Disp: 60 tablet, Rfl: 1 .  furosemide (LASIX) 40 MG tablet, Take 60 mg ( 1 1/2 pills) every OTHER  day, 40 mg (1 pill)  on alternate days, Disp: 115 tablet, Rfl: 3 .  lisinopril (PRINIVIL,ZESTRIL) 5 MG tablet, Take 1 tablet (5 mg total) by mouth daily., Disp: 30 tablet, Rfl: 1 .  polyethylene glycol-electrolytes (TRILYTE) 420 g solution, Take 4,000 mLs by mouth as directed., Disp: 4000 mL, Rfl: 0 .  potassium chloride (K-DUR) 10 MEQ tablet,  Take 10 meq (1 pill) when you take lasix 60 mg (take KCL every OTHER day), Disp: 45 tablet, Rfl: 3 .  vitamin B-12 (CYANOCOBALAMIN) 1000 MCG tablet, Take 1 tablet (1,000 mcg total) by mouth daily., Disp: 30 tablet, Rfl: 1  SLEEP ARCHITECTURE The study was initiated at 11:13:35 PM and ended at 5:04:07 AM. Sleep onset time was 11.7 minutes and the sleep efficiency was 81.0%. The total sleep time was 283.8 minutes. Stage REM latency was 175.0 minutes. The patient spent 1.94% of the night in stage N1 sleep, 43.52% in stage N2 sleep, 44.33% in stage N3 and 10.22% in REM. Alpha intrusion was absent. Supine sleep was 39.44%. RESPIRATORY PARAMETERS The overall apnea/hypopnea index (AHI) was 6.1 per hour. There were 24 total apneas, including 24 obstructive, 0 central and 0 mixed apneas. There were 5 hypopneas and 0 RERAs. The AHI during Stage REM sleep was 45.5 per hour. AHI while supine was 10.2 per hour. The mean oxygen saturation was 93.66%. The minimum SpO2 during sleep was 74.00%. Moderate snoring was noted during this study. CARDIAC DATA The 2 lead EKG demonstrated sinus rhythm. The mean heart rate was N/A beats per minute. Other EKG findings include: None. LEG MOVEMENT DATA The total PLMS were 393 with a resulting PLMS index of 83.09. Associated arousal with leg movement index was 8.7.   IMPRESSIONS - Severe periodic limb movements of sleep occurred during the study. Associated arousals were significant. Consider dopamine agonist such as requip or mirapex.  - Mild obstructive sleep apnea not requiring postive pressure treatment.   Delano Metz, MD Diplomate, American Board  of Sleep Medicine.

## 2016-07-31 NOTE — Discharge Instructions (Signed)
You have SMALL internal hemorrhoids, and HAD 2 LARGE polyp removed. You have mild gastritis.I biopsied your stomach.    FOLLOW A HIGH FIBER FAT DIET. AVOID ITEMS THAT CAUSE BLOATING. SEE INFO BELOW.  I WILL CHECK FOR SICKLE CELL TRAIT TODAY.  FOLLOW UP IN 3 MOS.  YOU NEED CBC/FERRITIN 1 WEEK PRIOR TO VISIT.  Next colonoscopy in 1-3 years.   ENDOSCOPY Care After Read the instructions outlined below and refer to this sheet in the next week. These discharge instructions provide you with general information on caring for yourself after you leave the hospital. While your treatment has been planned according to the most current medical practices available, unavoidable complications occasionally occur. If you have any problems or questions after discharge, call DR. Wyvonne Carda, [phone].  ACTIVITY  You may resume your regular activity, but move at a slower pace for the next 24 hours.   Take frequent rest periods for the next 24 hours.   Walking will help get rid of the air and reduce the bloated feeling in your belly (abdomen).   No driving for 24 hours (because of the medicine (anesthesia) used during the test).   You may shower.   Do not sign any important legal documents or operate any machinery for 24 hours (because of the anesthesia used during the test).    NUTRITION  Drink plenty of fluids.   You may resume your normal diet as instructed by your doctor.   Begin with a light meal and progress to your normal diet. Heavy or fried foods are harder to digest and may make you feel sick to your stomach (nauseated).   Avoid alcoholic beverages for 24 hours or as instructed.    MEDICATIONS  You may resume your normal medications.   WHAT YOU CAN EXPECT TODAY  Some feelings of bloating in the abdomen.   Passage of more gas than usual.   Spotting of blood in your stool or on the toilet paper  .  IF YOU HAD POLYPS REMOVED DURING THE ENDOSCOPY:  Eat a soft diet IF YOU HAVE  NAUSEA, BLOATING, ABDOMINAL PAIN, OR VOMITING.    FINDING OUT THE RESULTS OF YOUR TEST Not all test results are available during your visit. DR. Oneida Alar WILL CALL YOU WITHIN 7 DAYS OF YOUR PROCEDUE WITH YOUR RESULTS. Do not assume everything is normal if you have not heard from DR. Catalea Labrecque IN ONE WEEK, CALL HER OFFICE AT [phone].  SEEK IMMEDIATE MEDICAL ATTENTION AND CALL THE OFFICE: [phone] IF:  You have more than a spotting of blood in your stool.   Your belly is swollen (abdominal distention).   You are nauseated or vomiting.   You have a temperature over 101F.   You have abdominal pain or discomfort that is severe or gets worse throughout the day.  Polyps, Colon  A polyp is extra tissue that grows inside your body. Colon polyps grow in the large intestine. The large intestine, also called the colon, is part of your digestive system. It is a long, hollow tube at the end of your digestive tract where your body makes and stores stool. Most polyps are not dangerous. They are benign. This means they are not cancerous. But over time, some types of polyps can turn into cancer. Polyps that are smaller than a pea are usually not harmful. But larger polyps could someday become or may already be cancerous. To be safe, doctors remove all polyps and test them.   PREVENTION There is not one  sure way to prevent polyps. You might be able to lower your risk of getting them if you:  Eat more fruits and vegetables and less fatty food.   Do not smoke.   Avoid alcohol.   Exercise every day.   Lose weight if you are overweight.   Eating more calcium and folate can also lower your risk of getting polyps. Some foods that are rich in calcium are milk, cheese, and broccoli. Some foods that are rich in folate are chickpeas, kidney beans, and spinach.    Gastritis  Gastritis is an inflammation (the body's way of reacting to injury and/or infection) of the stomach. It is often caused by viral  or bacterial (germ) infections. It can also be caused BY ASPIRIN, BC/GOODY POWDER'S, (IBUPROFEN) MOTRIN, OR ALEVE (NAPROXEN), chemicals (including alcohol), SPICY FOODS, and medications. This illness may be associated with generalized malaise (feeling tired, not well), UPPER ABDOMINAL STOMACH cramps, and fever. One common bacterial cause of gastritis is an organism known as H. Pylori. This can be treated with antibiotics.    High-Fiber Diet A high-fiber diet changes your normal diet to include more whole grains, legumes, fruits, and vegetables. Changes in the diet involve replacing refined carbohydrates with unrefined foods. The calorie level of the diet is essentially unchanged. The Dietary Reference Intake (recommended amount) for adult males is 38 grams per day. For adult females, it is 25 grams per day. Pregnant and lactating women should consume 28 grams of fiber per day. Fiber is the intact part of a plant that is not broken down during digestion. Functional fiber is fiber that has been isolated from the plant to provide a beneficial effect in the body. PURPOSE  Increase stool bulk.   Ease and regulate bowel movements.   Lower cholesterol.   REDUCE RISK OF COLON CANCER  INDICATIONS THAT YOU NEED MORE FIBER  Constipation and hemorrhoids.   Uncomplicated diverticulosis (intestine condition) and irritable bowel syndrome.   Weight management.   As a protective measure against hardening of the arteries (atherosclerosis), diabetes, and cancer.   GUIDELINES FOR INCREASING FIBER IN THE DIET  Start adding fiber to the diet slowly. A gradual increase of about 5 more grams (2 slices of whole-wheat bread, 2 servings of most fruits or vegetables, or 1 bowl of high-fiber cereal) per day is best. Too rapid an increase in fiber may result in constipation, flatulence, and bloating.   Drink enough water and fluids to keep your urine clear or pale yellow. Water, juice, or caffeine-free drinks are  recommended. Not drinking enough fluid may cause constipation.   Eat a variety of high-fiber foods rather than one type of fiber.   Try to increase your intake of fiber through using high-fiber foods rather than fiber pills or supplements that contain small amounts of fiber.   The goal is to change the types of food eaten. Do not supplement your present diet with high-fiber foods, but replace foods in your present diet.   INCLUDE A VARIETY OF FIBER SOURCES  Replace refined and processed grains with whole grains, canned fruits with fresh fruits, and incorporate other fiber sources. White rice, white breads, and most bakery goods contain little or no fiber.   Brown whole-grain rice, buckwheat oats, and many fruits and vegetables are all good sources of fiber. These include: broccoli, Brussels sprouts, cabbage, cauliflower, beets, sweet potatoes, white potatoes (skin on), carrots, tomatoes, eggplant, squash, berries, fresh fruits, and dried fruits.   Cereals appear to be the richest  source of fiber. Cereal fiber is found in whole grains and bran. Bran is the fiber-rich outer coat of cereal grain, which is largely removed in refining. In whole-grain cereals, the bran remains. In breakfast cereals, the largest amount of fiber is found in those with "bran" in their names. The fiber content is sometimes indicated on the label.   You may need to include additional fruits and vegetables each day.   In baking, for 1 cup white flour, you may use the following substitutions:   1 cup whole-wheat flour minus 2 tablespoons.   1/2 cup white flour plus 1/2 cup whole-wheat flour.   Hemorrhoids Hemorrhoids are dilated (enlarged) veins around the rectum. Sometimes clots will form in the veins. This makes them swollen and painful. These are called thrombosed hemorrhoids. Causes of hemorrhoids include:  Constipation.   Straining to have a bowel movement.   HEAVY LIFTING  HOME CARE INSTRUCTIONS  Eat a  well balanced diet and drink 6 to 8 glasses of water every day to avoid constipation. You may also use a bulk laxative.   Avoid straining to have bowel movements.   Keep anal area dry and clean.   Do not use a donut shaped pillow or sit on the toilet for long periods. This increases blood pooling and pain.   Move your bowels when your body has the urge; this will require less straining and will decrease pain and pressure.

## 2016-07-31 NOTE — H&P (Signed)
  Primary Care Physician:  Maggie Font, MD Primary Gastroenterologist:  Dr. Oneida Alar  Pre-Procedure History & Physical: HPI:  James Zamora is a 60 y.o. male here for MICROCYTIC ANEMIA.  Past Medical History:  Diagnosis Date  . Asthma   . CHF (congestive heart failure) ([place])   . Perforated bowel [facility])     Past Surgical History:  Procedure Laterality Date  . CHOLECYSTECTOMY      Prior to Admission medications   Medication Sig Start Date End Date Taking? Authorizing Provider  aspirin EC 81 MG EC tablet Take 1 tablet (81 mg total) by mouth daily. 06/12/16  Yes Kathie Dike, MD  carvedilol (COREG) 6.25 MG tablet Take 1 tablet (6.25 mg total) by mouth 2 (two) times daily with a meal. 06/12/16  Yes Kathie Dike, MD  furosemide (LASIX) 40 MG tablet Take 60 mg ( 1 1/2 pills) every OTHER  day, 40 mg (1 pill)  on alternate days 06/27/16  Yes Fay Records, MD  lisinopril (PRINIVIL,ZESTRIL) 5 MG tablet Take 1 tablet (5 mg total) by mouth daily. 06/12/16  Yes Kathie Dike, MD  polyethylene glycol-electrolytes (TRILYTE) 420 g solution Take 4,000 mLs by mouth as directed. 07/17/16  Yes Danie Binder, MD  potassium chloride (K-DUR) 10 MEQ tablet Take 10 meq (1 pill) when you take lasix 60 mg (take KCL every OTHER day) 06/27/16  Yes Fay Records, MD  vitamin B-12 (CYANOCOBALAMIN) 1000 MCG tablet Take 1 tablet (1,000 mcg total) by mouth daily. 06/12/16  Yes Kathie Dike, MD  acetaminophen (TYLENOL) 500 MG tablet Take 1,000 mg by mouth every 6 (six) hours as needed for mild pain or headache.    Historical Provider, MD    Allergies as of 07/17/2016 - Review Complete 07/06/2016  Allergen Reaction Noted  . Nutritional supplements Hives and Swelling 06/12/2011  . Penicillins  06/12/2011    Family History  Problem Relation Age of Onset  . Diabetes Mother   . Asthma Father   . Colon cancer Neg Hx     Social History   Social History  . Marital status: Single    Spouse name: N/A  . Number  of children: N/A  . Years of education: N/A   Occupational History  . Not on file.   Social History Main Topics  . Smoking status: Former Smoker    Quit date: 07/06/2014  . Smokeless tobacco: Former Systems developer    Types: Chew    Quit date: 07/06/2014  . Alcohol use No  . Drug use: No  . Sexual activity: Not on file   Other Topics Concern  . Not on file   Social History Narrative  . No narrative on file    Review of Systems: See HPI, otherwise negative ROS   Physical Exam: BP 130/71   Pulse 95   Temp 97.8 F (36.6 C) (Oral)   Resp 17   Ht '5\' 6"'$  (1.676 m)   Wt 226 lb (102.5 kg)   SpO2 91%   BMI 36.48 kg/m  General:   Alert,  pleasant and cooperative in NAD Head:  Normocephalic and atraumatic. Neck:  Supple; Lungs:  Clear throughout to auscultation.    Heart:  Regular rate and rhythm. Abdomen:  Soft, nontender and nondistended. Normal bowel sounds, without guarding, and without rebound.   Neurologic:  Alert and  oriented x4;  grossly normal neurologically.  Impression/Plan:     MICROCYTIC ANEMIA.  PLAN:  1. TCS/?EGD TODAY

## 2016-07-31 NOTE — Op Note (Addendum)
[facility] Patient Name: James Zamora Procedure Date: 07/31/2016 1:47 PM MRN: [id number] Date of Birth: [birth year] Attending MD: Barney Drain , MD CSN: [id number] Age: 60 Admit Type: Outpatient Procedure:                Colonoscopy WITH SNARE CAUTERY POLYPECTOMY Indications:              Anemia-MICROCYTIC(MCV 68, Hb 11.3), FERRITIN SEP                            2017 75 Providers:                Barney Drain, MD, Lurline Del, RN, Randa Spike,                            Technician Referring MD:             Barrie Folk. Hill MD, MD Medicines:                Meperidine 75 mg IV, Midazolam 3 mg IV Complications:            No immediate complications. Estimated Blood Loss:     Estimated blood loss was minimal. Procedure:                Pre-Anesthesia Assessment:                           - Prior to the procedure, a History and Physical                            was performed, and patient medications and                            allergies were reviewed. The patient's tolerance of                            previous anesthesia was also reviewed. The risks                            and benefits of the procedure and the sedation                            options and risks were discussed with the patient.                            All questions were answered, and informed consent                            was obtained. Prior Anticoagulants: The patient has                            taken aspirin, last dose was day of procedure. ASA                            Grade Assessment: II - A patient with mild systemic  disease. After reviewing the risks and benefits,                            the patient was deemed in satisfactory condition to                            undergo the procedure. After obtaining informed                            consent, the colonoscope was passed under direct                            vision. Throughout the procedure, the  patient's                            blood pressure, pulse, and oxygen saturations were                            monitored continuously. The [id number] ([id number])                            scope was introduced through the anus and advanced                            to the the cecum, identified by appendiceal orifice                            and ileocecal valve. The terminal ileum, ileocecal                            valve, appendiceal orifice, and rectum were                            photographed. The colonoscopy was somewhat                            difficult due to a tortuous colon. Successful                            completion of the procedure was aided by using                            manual pressure and COLOWRAP. The patient tolerated                            the procedure well. The quality of the bowel                            preparation was good. Scope In: 2:15:04 PM Scope Out: 2:44:04 PM Scope Withdrawal Time: 0 hours 24 minutes 27 seconds  Total Procedure Duration: 0 hours 29 minutes 0 seconds  Findings:      Two sessile polyps were found in the rectum and ascending colon. The       polyps were  11 to 13 mm in size. These polyps were removed with a hot       snare. Resection and retrieval were complete.      The recto-sigmoid colon and sigmoid colon were moderately redundant. AND       COLOWRAP      Non-bleeding internal hemorrhoids were found. The hemorrhoids were small. Impression:               - Two 11 to 13 mm polyps in the rectum and in the                            ascending colon, removed with a hot snare.                           - Redundant colon.                           - Non-bleeding internal hemorrhoids. Moderate Sedation:      Moderate (conscious) sedation was administered by the endoscopy nurse       and supervised by the endoscopist. The following parameters were       monitored: oxygen saturation, heart rate, blood pressure, and response        to care. Total physician intraservice time was 58 minutes. Recommendation:           - High fiber diet.                           - Continue present medications.                           - Await pathology results.                           - Repeat colonoscopy 1-3 YEARS for surveillance.                           - Return to my office in 3 months. CBC/FERRITIN 1                            WEEK PRIOR TO OPV. IF LOW FERRITIN/Hb COULD                            CONSIDER CAPSULE ENDOSCOPY.                           - Patient has a contact number available for                            emergencies. The signs and symptoms of potential                            delayed complications were discussed with the                            patient. Return to normal activities tomorrow.  Written discharge instructions were provided to the                            patient. Procedure Code(s):        --- Professional ---                           [phone], Colonoscopy, flexible; with removal of                            tumor(s), polyp(s), or other lesion(s) by snare                            technique                           99152, Moderate sedation services provided by the                            same physician or other qualified health care                            professional performing the diagnostic or                            therapeutic service that the sedation supports,                            requiring the presence of an independent trained                            observer to assist in the monitoring of the                            patient's level of consciousness and physiological                            status; initial 15 minutes of intraservice time,                            patient age 52 years or older                           [id number], Moderate sedation services; each additional                            15 minutes intraservice  time                           99153, Moderate sedation services; each additional                            15 minutes intraservice time                           99153, Moderate sedation services; each  additional                            15 minutes intraservice time Diagnosis Code(s):        --- Professional ---                           K62.1, Rectal polyp                           D12.2, Benign neoplasm of ascending colon                           K64.8, Other hemorrhoids                           D64.9, Anemia, unspecified                           Q43.8, Other specified congenital malformations of                            intestine CPT copyright 2016 [employer]. All rights reserved. The codes documented in this report are preliminary and upon coder review may  be revised to meet current compliance requirements. Barney Drain, MD Barney Drain, MD 07/31/2016 3:26:50 PM This report has been signed electronically. Number of Addenda: 0

## 2016-08-01 DIAGNOSIS — D649 Anemia, unspecified: Secondary | ICD-10-CM | POA: Diagnosis not present

## 2016-08-01 DIAGNOSIS — I5030 Unspecified diastolic (congestive) heart failure: Secondary | ICD-10-CM | POA: Diagnosis not present

## 2016-08-01 DIAGNOSIS — R238 Other skin changes: Secondary | ICD-10-CM | POA: Diagnosis not present

## 2016-08-01 DIAGNOSIS — Z7982 Long term (current) use of aspirin: Secondary | ICD-10-CM | POA: Diagnosis not present

## 2016-08-01 DIAGNOSIS — J45909 Unspecified asthma, uncomplicated: Secondary | ICD-10-CM | POA: Diagnosis not present

## 2016-08-01 DIAGNOSIS — Z9981 Dependence on supplemental oxygen: Secondary | ICD-10-CM | POA: Diagnosis not present

## 2016-08-01 LAB — SICKLE CELL SCREEN: SICKLE CELL SCREEN: NEGATIVE

## 2016-08-03 ENCOUNTER — Other Ambulatory Visit: Payer: Self-pay

## 2016-08-03 DIAGNOSIS — D649 Anemia, unspecified: Secondary | ICD-10-CM

## 2016-08-03 NOTE — Progress Notes (Signed)
PT's sister, Coralee Pesa, is aware of results. She take the pt to appts and labs. Said please mail the lab orders to her at [address]. Linna Hoff, [place]. I made a note on the lab orders. They are on file for 3 months.

## 2016-08-07 ENCOUNTER — Ambulatory Visit: Payer: Medicare Other | Admitting: Adult Health

## 2016-08-07 NOTE — Progress Notes (Deleted)
Cardiology Office Note   Date:  08/07/2016   ID:  James Zamora, James Zamora [birth year], MRN [id number]  PCP:  Maggie Font, MD  Cardiologist: Ross/  Jory Sims, NP   No chief complaint on file.     History of Present Illness: James Zamora is a 60 y.o. male who presents for  Ongoing assessment and management of  Diastolic CHF, chronic dyspnea , oxygen dependent.was last in the office by Dr. Harrington Challenger in August 2017. Has some complaints of edema.Lasix was increased to 60 mg every other day and 40 mg on alternate days. Take potassium  When she took increased dose of Lasix. She was to follow-up in pulmonary clinic.   most recent labs were drawn on 06/26/2016, they were essentially normal. Mild anemia was noted however with a hemoglobin of 11.3 and hematocrit of 39.0. Iron  Was low at 34,and ferritin was within normal limits.     Past Medical History:  Diagnosis Date  . Asthma   . CHF (congestive heart failure) ([place])   . Perforated bowel [facility])     Past Surgical History:  Procedure Laterality Date  . CHOLECYSTECTOMY       Current Outpatient Prescriptions  Medication Sig Dispense Refill  . acetaminophen (TYLENOL) 500 MG tablet Take 1,000 mg by mouth every 6 (six) hours as needed for mild pain or headache.    Marland Kitchen aspirin EC 81 MG EC tablet Take 1 tablet (81 mg total) by mouth daily. 30 tablet 1  . carvedilol (COREG) 6.25 MG tablet Take 1 tablet (6.25 mg total) by mouth 2 (two) times daily with a meal. 60 tablet 1  . furosemide (LASIX) 40 MG tablet Take 60 mg ( 1 1/2 pills) every OTHER  day, 40 mg (1 pill)  on alternate days 115 tablet 3  . lisinopril (PRINIVIL,ZESTRIL) 5 MG tablet Take 1 tablet (5 mg total) by mouth daily. 30 tablet 1  . potassium chloride (K-DUR) 10 MEQ tablet Take 10 meq (1 pill) when you take lasix 60 mg (take KCL every OTHER day) 45 tablet 3  . vitamin B-12 (CYANOCOBALAMIN) 1000 MCG tablet Take 1 tablet (1,000 mcg total) by mouth daily. 30 tablet 1   No  current facility-administered medications for this visit.     Allergies:   Nutritional supplements and Penicillins    Social History:  The patient  reports that he quit smoking about 2 years ago. He quit smokeless tobacco use about 2 years ago. His smokeless tobacco use included Chew. He reports that he does not drink alcohol or use drugs.   Family History:  The patient's family history includes Asthma in his father; Diabetes in his mother.    ROS: All other systems are reviewed and negative. Unless otherwise mentioned in H&P    PHYSICAL EXAM: VS:  There were no vitals taken for this visit. , BMI There is no height or weight on file to calculate BMI. GEN: Well nourished, well developed, in no acute distress  HEENT: normal  Neck: no JVD, carotid bruits, or masses Cardiac: ***RRR; no murmurs, rubs, or gallops,no edema  Respiratory:  clear to auscultation bilaterally, normal work of breathing GI: soft, nontender, nondistended, + BS MS: no deformity or atrophy  Skin: warm and dry, no rash Neuro:  Strength and sensation are intact Psych: euthymic mood, full affect   EKG:  EKG {ACTION; IS/IS [id number]} ordered today. The ekg ordered today demonstrates ***   Recent Labs: 06/26/2016: ALT 9; Brain Natriuretic Peptide 5.6;  BUN 12; Creat 1.24; Hemoglobin 11.3; Platelets 312; Potassium 4.2; Sodium 138; TSH 0.58    Lipid Panel No results found for: CHOL, TRIG, HDL, CHOLHDL, VLDL, LDLCALC, LDLDIRECT    Wt Readings from Last 3 Encounters:  07/31/16 226 lb (102.5 kg)  07/06/16 226 lb 6.4 oz (102.7 kg)  06/26/16 228 lb (103.4 kg)      Other studies Reviewed: Additional studies/ records that were reviewed today include: ***. Review of the above records demonstrates: ***   ASSESSMENT AND PLAN:  1.  ***   Current medicines are reviewed at length with the patient today.    Labs/ tests ordered today include: *** No orders of the defined types were placed in this  encounter.    Disposition:   FU with *** in {gen number [id number]} {TIME; UNITS DAY/WEEK/MONTH:19136}   Signed, Jory Sims, NP  08/07/2016 7:40 AM    [facility] [address] [place], [place] [address] Phone: [phone]; Fax: [phone]

## 2016-08-08 DIAGNOSIS — J45909 Unspecified asthma, uncomplicated: Secondary | ICD-10-CM | POA: Diagnosis not present

## 2016-08-08 DIAGNOSIS — Z9981 Dependence on supplemental oxygen: Secondary | ICD-10-CM | POA: Diagnosis not present

## 2016-08-08 DIAGNOSIS — I5030 Unspecified diastolic (congestive) heart failure: Secondary | ICD-10-CM | POA: Diagnosis not present

## 2016-08-08 DIAGNOSIS — D649 Anemia, unspecified: Secondary | ICD-10-CM | POA: Diagnosis not present

## 2016-08-08 DIAGNOSIS — Z7982 Long term (current) use of aspirin: Secondary | ICD-10-CM | POA: Diagnosis not present

## 2016-08-08 DIAGNOSIS — R238 Other skin changes: Secondary | ICD-10-CM | POA: Diagnosis not present

## 2016-08-09 ENCOUNTER — Encounter (HOSPITAL_COMMUNITY): Payer: Self-pay | Admitting: Gastroenterology

## 2016-08-09 DIAGNOSIS — I5040 Unspecified combined systolic (congestive) and diastolic (congestive) heart failure: Secondary | ICD-10-CM | POA: Diagnosis not present

## 2016-08-09 DIAGNOSIS — I5023 Acute on chronic systolic (congestive) heart failure: Secondary | ICD-10-CM | POA: Diagnosis not present

## 2016-08-11 ENCOUNTER — Encounter: Payer: Self-pay | Admitting: Adult Health

## 2016-08-11 ENCOUNTER — Ambulatory Visit (INDEPENDENT_AMBULATORY_CARE_PROVIDER_SITE_OTHER): Payer: Medicare Other | Admitting: Adult Health

## 2016-08-11 VITALS — BP 96/64 | HR 90 | Ht 66.0 in | Wt 225.0 lb

## 2016-08-11 DIAGNOSIS — I5032 Chronic diastolic (congestive) heart failure: Secondary | ICD-10-CM

## 2016-08-11 DIAGNOSIS — Z23 Encounter for immunization: Secondary | ICD-10-CM

## 2016-08-11 NOTE — Patient Instructions (Addendum)
Your physician recommends that you schedule a follow-up appointment in: 1 month      Your physician recommends that you continue on your current medications as directed. Please refer to the Current Medication list given to you today.    Thank you for choosing [facility] !

## 2016-08-11 NOTE — Progress Notes (Signed)
Cardiology Office Note   Date:  08/11/2016   ID:  Nalu, [place] 1956/09/28, MRN [id number]  PCP:  Maggie Font, MD  Cardiologist:  Ross/ Jory Sims, NP   Chief Complaint  Patient presents with  . Congestive Heart Failure      History of Present Illness: James Zamora is a 60 y.o. male who presents for ongoing assessment and management of chronic shortness of breath, diastolic CHF, with mild decompensation last office visit with Dr. Harrington Challenger on 06/26/2016. No medical management changes were made and a follow-up BMET was ordered. He was instructed to watch salt intake.  Labs on 06/26/2016 revealed sodium 138 potassium 4.2 chloride 98 CO2 34 BUN 12 creatinine 1.24. He is mildly anemic with a hemoglobin 11.3 hematocrit 39.0 white blood cells 4.8 platelets 312. He was advised to follow-up primary care regarding oxygen therapy.  He comes today feeling some better. His weight is essentially unchanged. He does not take his diuretic consistently. He is only taking one tablet on Monday and Tuesday Wednesdays, and 2 tablets on Sunday and Tuesday and Saturdays as long as he does not have to go anywhere. He is also having a difficult time with salt restriction. He has been placed on oxygen therapy.  Past Medical History:  Diagnosis Date  . Asthma   . CHF (congestive heart failure) ([place])   . Perforated bowel [facility])     Past Surgical History:  Procedure Laterality Date  . CHOLECYSTECTOMY    . COLONOSCOPY N/A 07/31/2016   Procedure: COLONOSCOPY;  Surgeon: Danie Binder, MD;  Location[facility];  Service: Endoscopy;  Laterality: N/A;  1:45 pm  . ESOPHAGOGASTRODUODENOSCOPY N/A 07/31/2016   Procedure: ESOPHAGOGASTRODUODENOSCOPY (EGD);  Surgeon: Danie Binder, MD;  Location: [facility] ENDO SU[facility];  Service: Endoscopy;  Laterality: N/A;     Current Outpatient Prescriptions  Medication Sig Dispense Refill  . acetaminophen (TYLENOL) 500 MG tablet Take 1,000 mg by mouth every 6 (six)  hours as needed for mild pain or headache.    Marland Kitchen aspirin EC 81 MG EC tablet Take 1 tablet (81 mg total) by mouth daily. 30 tablet 1  . carvedilol (COREG) 6.25 MG tablet Take 1 tablet (6.25 mg total) by mouth 2 (two) times daily with a meal. 60 tablet 1  . furosemide (LASIX) 40 MG tablet Take 60 mg ( 1 1/2 pills) every OTHER  day, 40 mg (1 pill)  on alternate days 115 tablet 3  . lisinopril (PRINIVIL,ZESTRIL) 5 MG tablet Take 1 tablet (5 mg total) by mouth daily. 30 tablet 1  . potassium chloride (K-DUR) 10 MEQ tablet Take 10 meq (1 pill) when you take lasix 60 mg (take KCL every OTHER day) 45 tablet 3  . vitamin B-12 (CYANOCOBALAMIN) 1000 MCG tablet Take 1 tablet (1,000 mcg total) by mouth daily. 30 tablet 1   No current facility-administered medications for this visit.     Allergies:   Nutritional supplements and Penicillins    Social History:  The patient  reports that he quit smoking about 2 years ago. He quit smokeless tobacco use about 2 years ago. His smokeless tobacco use included Chew. He reports that he does not drink alcohol or use drugs.   Family History:  The patient's family history includes Asthma in his father; Diabetes in his mother.    ROS: All other systems are reviewed and negative. Unless otherwise mentioned in H&P    PHYSICAL EXAM: VS:  BP 96/64   Pulse  90   Ht '5\' 6"'$  (1.676 m)   Wt 225 lb (102.1 kg)   SpO2 93%   BMI 36.32 kg/m  , BMI Body mass index is 36.32 kg/m. GEN: Well nourished, well developed, in no acute distress  HEENT: normal  Neck: no JVD, carotid bruits, or masses Cardiac: RRR; no murmurs, rubs, or gallops,no edema  Respiratory:  clear to auscultation bilaterally, normal work of breathing GI: soft, nontender, nondistended, + BS MS: no deformity or atrophy  Skin: warm and dry, no rash Neuro:  Strength and sensation are intact Psych: euthymic mood, full affect   Recent Labs: 06/26/2016: ALT 9; Brain Natriuretic Peptide 5.6; BUN 12; Creat 1.24;  Hemoglobin 11.3; Platelets 312; Potassium 4.2; Sodium 138; TSH 0.58    Lipid Panel No results found for: CHOL, TRIG, HDL, CHOLHDL, VLDL, LDLCALC, LDLDIRECT    Wt Readings from Last 3 Encounters:  08/11/16 225 lb (102.1 kg)  07/31/16 226 lb (102.5 kg)  07/06/16 226 lb 6.4 oz (102.7 kg)      ASSESSMENT AND PLAN:  1. Chronic diastolic heart failure: No evidence of fluid overload or decompensation on exam. He does not take his diuretic consistently. He is maintaining his weight however but he is not watching salt intake. He has been weighing himself every day IV continue to give him reassurance that he needs to watch his salt and manages fluid weight. He verbalizes understanding. I've asked him on next office visit to bring his medications with him so that week and make sure which she is taking at which doses along with potassium supplement.  2. Chronic dyspnea: He is now oxygen dependent and is being followed by primary care for this.   Current medicines are reviewed at length with the patient today.    Labs/ tests ordered today include: None  Orders Placed This Encounter  Procedures  . Flu Vaccine QUAD 36+ mos IM     Disposition:   FU with One month Signed, Jory Sims, NP  08/11/2016 4:59 PM    [facility] [address] [place], [place] [address] Phone: [phone]; Fax: [phone]

## 2016-08-11 NOTE — Progress Notes (Signed)
Name: James Zamora    DOB: 09-29-1956  Age: 60 y.o.  MR#: [id number]       PCP:  Maggie Font, MD      Insurance: Payor: MEDICARE / Plan: MEDICARE PART A AND B / Product Type: *No Product type* /   CC:   No chief complaint on file.   VS Vitals:   08/11/16 1436  Weight: 225 lb (102.1 kg)  Height: '5\' 6"'$  (1.676 m)    Weights Current Weight  08/11/16 225 lb (102.1 kg)  07/31/16 226 lb (102.5 kg)  07/06/16 226 lb 6.4 oz (102.7 kg)    Blood Pressure  BP Readings from Last 3 Encounters:  07/31/16 127/73  07/06/16 114/75  06/26/16 100/66     Admit date:  (Not on file) Last encounter with RMR:  Visit date not found   Allergy Nutritional supplements and Penicillins  Current Outpatient Prescriptions  Medication Sig Dispense Refill  . acetaminophen (TYLENOL) 500 MG tablet Take 1,000 mg by mouth every 6 (six) hours as needed for mild pain or headache.    Marland Kitchen aspirin EC 81 MG EC tablet Take 1 tablet (81 mg total) by mouth daily. 30 tablet 1  . carvedilol (COREG) 6.25 MG tablet Take 1 tablet (6.25 mg total) by mouth 2 (two) times daily with a meal. 60 tablet 1  . furosemide (LASIX) 40 MG tablet Take 60 mg ( 1 1/2 pills) every OTHER  day, 40 mg (1 pill)  on alternate days 115 tablet 3  . lisinopril (PRINIVIL,ZESTRIL) 5 MG tablet Take 1 tablet (5 mg total) by mouth daily. 30 tablet 1  . potassium chloride (K-DUR) 10 MEQ tablet Take 10 meq (1 pill) when you take lasix 60 mg (take KCL every OTHER day) 45 tablet 3  . vitamin B-12 (CYANOCOBALAMIN) 1000 MCG tablet Take 1 tablet (1,000 mcg total) by mouth daily. 30 tablet 1   No current facility-administered medications for this visit.     Discontinued Meds:   There are no discontinued medications.  Patient Active Problem List   Diagnosis Date Noted  . Swelling   . Acute diastolic CHF (congestive heart failure) ([place]) 06/07/2016  . Emesis 06/07/2016  . Generalized abdominal pain 06/07/2016  . Anemia 06/07/2016  . Dehydration  11/13/2015  . AKI (acute kidney injury) ([place]) 11/13/2015  . Wound infection 11/13/2015  . Pulmonary nodule 11/13/2015    LABS    Component Value Date/Time   NA 138 06/26/2016 1450   NA 133 (L) 06/11/2016 0554   NA 130 (L) 06/10/2016 0543   K 4.2 06/26/2016 1450   K 3.8 06/11/2016 0554   K 3.6 06/10/2016 0543   CL 98 06/26/2016 1450   CL 93 (L) 06/11/2016 0554   CL 92 (L) 06/10/2016 0543   CO2 34 (H) 06/26/2016 1450   CO2 34 (H) 06/11/2016 0554   CO2 32 06/10/2016 0543   GLUCOSE 85 06/26/2016 1450   GLUCOSE 91 06/11/2016 0554   GLUCOSE 82 06/10/2016 0543   BUN 12 06/26/2016 1450   BUN 15 06/11/2016 0554   BUN 20 06/10/2016 0543   CREATININE 1.24 06/26/2016 1450   CREATININE 1.14 06/11/2016 0554   CREATININE 1.41 (H) 06/10/2016 0543   CREATININE 1.40 (H) 06/09/2016 0558   CALCIUM 9.0 06/26/2016 1450   CALCIUM 8.1 (L) 06/11/2016 0554   CALCIUM 8.0 (L) 06/10/2016 0543   GFRNONAA >60 06/11/2016 0554   GFRNONAA 53 (L) 06/10/2016 0543   GFRNONAA 53 (L) 06/09/2016 4166  GFRAA >60 06/11/2016 0554   GFRAA >60 06/10/2016 0543   GFRAA >60 06/09/2016 0558   CMP     Component Value Date/Time   NA 138 06/26/2016 1450   K 4.2 06/26/2016 1450   CL 98 06/26/2016 1450   CO2 34 (H) 06/26/2016 1450   GLUCOSE 85 06/26/2016 1450   BUN 12 06/26/2016 1450   CREATININE 1.24 06/26/2016 1450   CALCIUM 9.0 06/26/2016 1450   PROT 7.8 06/26/2016 1450   ALBUMIN 3.6 06/26/2016 1450   AST 14 06/26/2016 1450   ALT 9 06/26/2016 1450   ALKPHOS 72 06/26/2016 1450   BILITOT 0.3 06/26/2016 1450   GFRNONAA >60 06/11/2016 0554   GFRAA >60 06/11/2016 0554       Component Value Date/Time   WBC 4.8 06/26/2016 1450   WBC 4.8 06/11/2016 0554   WBC 7.4 06/07/2016 1140   HGB 11.3 (L) 06/26/2016 1450   HGB 11.1 (L) 06/11/2016 0554   HGB 10.9 (L) 06/07/2016 1140   HCT 39.0 06/26/2016 1450   HCT 38.8 (L) 06/11/2016 0554   HCT 37.5 (L) 06/07/2016 1140   MCV 68.8 (L) 06/26/2016 1450   MCV 68.2  (L) 06/11/2016 0554   MCV 67.4 (L) 06/07/2016 1140    Lipid Panel  No results found for: CHOL, TRIG, HDL, CHOLHDL, VLDL, LDLCALC, LDLDIRECT  ABG    Component Value Date/Time   TCO2 26 11/13/2015 0207     Lab Results  Component Value Date   TSH 0.58 06/26/2016   BNP (last 3 results)  Recent Labs  06/07/16 0415 06/26/16 1450  BNP 14.0 5.6    ProBNP (last 3 results) No results for input(s): PROBNP in the last 8760 hours.  Cardiac Panel (last 3 results) No results for input(s): CKTOTAL, CKMB, TROPONINI, RELINDX in the last 72 hours.  Iron/TIBC/Ferritin/ %Sat    Component Value Date/Time   IRON 34 (L) 07/06/2016 1133   TIBC 454 (H) 06/07/2016 1140   FERRITIN 75 07/06/2016 1133   IRONPCTSAT 4 (L) 06/07/2016 1140     EKG Orders placed or performed during the hospital encounter of 06/07/16  . ED EKG  . ED EKG  . EKG 12-Lead  . EKG 12-Lead  . EKG     Prior Assessment and Plan Problem List as of 08/11/2016 Reviewed: 07/06/2016 10:20 AM by Ranae Pila, NP     Cardiovascular and Mediastinum   Acute diastolic CHF (congestive heart failure) ([place])     Genitourinary   AKI (acute kidney injury) ([place])     Other   Dehydration   Wound infection   Pulmonary nodule   Emesis   Generalized abdominal pain   Anemia   Last Assessment & Plan 07/06/2016 Office Visit Written 07/06/2016 10:50 AM by Carlis Stable, NP    Patient with noted microcytic anemia, no iron studies done yet. We will check iron and ferritin today. He is never had a colonoscopy or upper endoscopy before. He will likely need colonoscopy and EGD. Potentially needs Givens capsule if no findings on endoscopic evaluation. Prior to procedure will need to request clearance from cardiology related to heart failure. He is on oxygen but states it is only as needed if he is up and moving around. Return for follow-up in 4 months.  Proceed with colonoscopy and EGD with Dr. Oneida Alar in the near future. The risks, benefits,  and alternatives have been discussed in detail with the patient. They state understanding and desire to proceed.   The  patient is not on any anticoagulants, anxiolytics, chronic pain medications, or antidepressants. Conscious sedation should be adequate for his procedure.      Swelling       Imaging: No results found.

## 2016-08-12 DIAGNOSIS — Z9981 Dependence on supplemental oxygen: Secondary | ICD-10-CM | POA: Diagnosis not present

## 2016-08-12 DIAGNOSIS — Z7982 Long term (current) use of aspirin: Secondary | ICD-10-CM | POA: Diagnosis not present

## 2016-08-12 DIAGNOSIS — J45909 Unspecified asthma, uncomplicated: Secondary | ICD-10-CM | POA: Diagnosis not present

## 2016-08-12 DIAGNOSIS — R238 Other skin changes: Secondary | ICD-10-CM | POA: Diagnosis not present

## 2016-08-12 DIAGNOSIS — I5030 Unspecified diastolic (congestive) heart failure: Secondary | ICD-10-CM | POA: Diagnosis not present

## 2016-08-12 DIAGNOSIS — D649 Anemia, unspecified: Secondary | ICD-10-CM | POA: Diagnosis not present

## 2016-08-15 DIAGNOSIS — D649 Anemia, unspecified: Secondary | ICD-10-CM | POA: Diagnosis not present

## 2016-08-15 DIAGNOSIS — Z9981 Dependence on supplemental oxygen: Secondary | ICD-10-CM | POA: Diagnosis not present

## 2016-08-15 DIAGNOSIS — J45909 Unspecified asthma, uncomplicated: Secondary | ICD-10-CM | POA: Diagnosis not present

## 2016-08-15 DIAGNOSIS — R238 Other skin changes: Secondary | ICD-10-CM | POA: Diagnosis not present

## 2016-08-15 DIAGNOSIS — I5030 Unspecified diastolic (congestive) heart failure: Secondary | ICD-10-CM | POA: Diagnosis not present

## 2016-08-15 DIAGNOSIS — Z7982 Long term (current) use of aspirin: Secondary | ICD-10-CM | POA: Diagnosis not present

## 2016-08-16 DIAGNOSIS — J45909 Unspecified asthma, uncomplicated: Secondary | ICD-10-CM | POA: Diagnosis not present

## 2016-08-16 DIAGNOSIS — D649 Anemia, unspecified: Secondary | ICD-10-CM | POA: Diagnosis not present

## 2016-08-16 DIAGNOSIS — Z9981 Dependence on supplemental oxygen: Secondary | ICD-10-CM | POA: Diagnosis not present

## 2016-08-16 DIAGNOSIS — Z7982 Long term (current) use of aspirin: Secondary | ICD-10-CM | POA: Diagnosis not present

## 2016-08-16 DIAGNOSIS — R238 Other skin changes: Secondary | ICD-10-CM | POA: Diagnosis not present

## 2016-08-16 DIAGNOSIS — I5030 Unspecified diastolic (congestive) heart failure: Secondary | ICD-10-CM | POA: Diagnosis not present

## 2016-08-22 DIAGNOSIS — Z7982 Long term (current) use of aspirin: Secondary | ICD-10-CM | POA: Diagnosis not present

## 2016-08-22 DIAGNOSIS — R238 Other skin changes: Secondary | ICD-10-CM | POA: Diagnosis not present

## 2016-08-22 DIAGNOSIS — I5030 Unspecified diastolic (congestive) heart failure: Secondary | ICD-10-CM | POA: Diagnosis not present

## 2016-08-22 DIAGNOSIS — D649 Anemia, unspecified: Secondary | ICD-10-CM | POA: Diagnosis not present

## 2016-08-22 DIAGNOSIS — Z9981 Dependence on supplemental oxygen: Secondary | ICD-10-CM | POA: Diagnosis not present

## 2016-08-22 DIAGNOSIS — J45909 Unspecified asthma, uncomplicated: Secondary | ICD-10-CM | POA: Diagnosis not present

## 2016-08-29 DIAGNOSIS — J45909 Unspecified asthma, uncomplicated: Secondary | ICD-10-CM | POA: Diagnosis not present

## 2016-08-29 DIAGNOSIS — Z9981 Dependence on supplemental oxygen: Secondary | ICD-10-CM | POA: Diagnosis not present

## 2016-08-29 DIAGNOSIS — R238 Other skin changes: Secondary | ICD-10-CM | POA: Diagnosis not present

## 2016-08-29 DIAGNOSIS — Z7982 Long term (current) use of aspirin: Secondary | ICD-10-CM | POA: Diagnosis not present

## 2016-08-29 DIAGNOSIS — D649 Anemia, unspecified: Secondary | ICD-10-CM | POA: Diagnosis not present

## 2016-08-29 DIAGNOSIS — I5030 Unspecified diastolic (congestive) heart failure: Secondary | ICD-10-CM | POA: Diagnosis not present

## 2016-09-05 DIAGNOSIS — R238 Other skin changes: Secondary | ICD-10-CM | POA: Diagnosis not present

## 2016-09-05 DIAGNOSIS — Z7982 Long term (current) use of aspirin: Secondary | ICD-10-CM | POA: Diagnosis not present

## 2016-09-05 DIAGNOSIS — I5030 Unspecified diastolic (congestive) heart failure: Secondary | ICD-10-CM | POA: Diagnosis not present

## 2016-09-05 DIAGNOSIS — J45909 Unspecified asthma, uncomplicated: Secondary | ICD-10-CM | POA: Diagnosis not present

## 2016-09-05 DIAGNOSIS — D649 Anemia, unspecified: Secondary | ICD-10-CM | POA: Diagnosis not present

## 2016-09-05 DIAGNOSIS — Z9981 Dependence on supplemental oxygen: Secondary | ICD-10-CM | POA: Diagnosis not present

## 2016-09-11 ENCOUNTER — Encounter: Payer: Self-pay | Admitting: Adult Health

## 2016-09-11 ENCOUNTER — Ambulatory Visit (INDEPENDENT_AMBULATORY_CARE_PROVIDER_SITE_OTHER): Payer: Medicare Other | Admitting: Adult Health

## 2016-09-11 VITALS — BP 124/84 | HR 91 | Ht 66.0 in | Wt 236.0 lb

## 2016-09-11 DIAGNOSIS — Z79899 Other long term (current) drug therapy: Secondary | ICD-10-CM

## 2016-09-11 DIAGNOSIS — I5033 Acute on chronic diastolic (congestive) heart failure: Secondary | ICD-10-CM

## 2016-09-11 MED ORDER — FUROSEMIDE 40 MG PO TABS: ORAL_TABLET | ORAL | 6 refills | Status: DC

## 2016-09-11 MED ORDER — METOLAZONE 2.5 MG PO TABS: 2.5000 mg | ORAL_TABLET | Freq: Every day | ORAL | 0 refills | Status: DC

## 2016-09-11 NOTE — Progress Notes (Signed)
Cardiology Office Note   Date:  09/11/2016   ID:  Zamora, James 02-04-1956, MRN [id number]  PCP:  Maggie Font, MD  Cardiologist: Ross/  Jory Sims, NP   Chief Complaint  Patient presents with  . Congestive Heart Failure  . Hypertension      History of Present Illness: James Zamora is a 60 y.o. male who presents for ongoing assessment and management of chronic shortness of breath, diastolic CHF. He was asked to bring his medications on next office visit so that we could review them all and make sure he is taking medications as directed.  He comes today with his weight up 12 pounds. His family member who is with him states that he is eating hotdogs fried chicken insulting the foods at home. He also states that he is drinking a lot of water as he is thirsty. He takes his medicines as directed as his sister provides them for him. His medications have been changed by Dr. Berdine Addison, his primary care physician to adjust the dose of his Lasix. He is now on 40 mg 2 tablets daily (80 mg) alternating with 40 mg 3 tablets (120 mg) every other day. Despite this adjustment and diuretics he continues to gain fluid. He denies any chest pain that he is having increasing lower extremity edema and shortness of breath. He is oxygen dependent.  Past Medical History:  Diagnosis Date  . Asthma   . CHF (congestive heart failure) ([place])   . Perforated bowel [facility])     Past Surgical History:  Procedure Laterality Date  . CHOLECYSTECTOMY    . COLONOSCOPY N/A 07/31/2016   Procedure: COLONOSCOPY;  Surgeon: Danie Binder, MD;  [facility];  Service: Endoscopy;  Laterality: N/A;  1:45 pm  . ESOPHAGOGASTRODUODENOSCOPY N/A 07/31/2016   Procedure: ESOPHAGOGASTRODUODENOSCOPY (EGD);  Surgeon: Danie Binder, MD;  Location[facility];  Service: Endoscopy;  Laterality: N/A;     Current Outpatient Prescriptions  Medication Sig Dispense Refill  . acetaminophen (TYLENOL) 500 MG tablet  Take 1,000 mg by mouth every 6 (six) hours as needed for mild pain or headache.    Marland Kitchen aspirin EC 81 MG EC tablet Take 1 tablet (81 mg total) by mouth daily. 30 tablet 1  . carvedilol (COREG) 6.25 MG tablet Take 1 tablet (6.25 mg total) by mouth 2 (two) times daily with a meal. 60 tablet 1  . furosemide (LASIX) 40 MG tablet Take 60 mg ( 1 1/2 pills) every OTHER  day, 40 mg (1 pill)  on alternate days 115 tablet 3  . lisinopril (PRINIVIL,ZESTRIL) 5 MG tablet Take 1 tablet (5 mg total) by mouth daily. 30 tablet 1  . potassium chloride (K-DUR) 10 MEQ tablet Take 10 meq (1 pill) when you take lasix 60 mg (take KCL every OTHER day) 45 tablet 3  . vitamin B-12 (CYANOCOBALAMIN) 1000 MCG tablet Take 1 tablet (1,000 mcg total) by mouth daily. 30 tablet 1  . furosemide (LASIX) 40 MG tablet Take 2 Tablets (80 mg)  One Day Alternating Days and Take 3 Tablets (120 mg) on the Other Day 90 tablet 6  . metolazone (ZAROXOLYN) 2.5 MG tablet Take 1 tablet (2.5 mg total) by mouth daily. 5 tablet 0   No current facility-administered medications for this visit.     Allergies:   Nutritional supplements and Penicillins    Social History:  The patient  reports that he quit smoking about 2 years ago. He quit  smokeless tobacco use about 2 years ago. His smokeless tobacco use included Chew. He reports that he does not drink alcohol or use drugs.   Family History:  The patient's family history includes Asthma in his father; Diabetes in his mother.    ROS: All other systems are reviewed and negative. Unless otherwise mentioned in H&P    PHYSICAL EXAM: VS:  BP 124/84   Pulse 91   Ht '5\' 6"'$  (1.676 m)   Wt 236 lb (107 kg)   BMI 38.09 kg/m  , BMI Body mass index is 38.09 kg/m. GEN: Well nourished, well developed, in no acute distress  HEENT: normal  Neck: no JVD, carotid bruits, or masses Cardiac: RRR; no murmurs, rubs, or gallops,Significant 2+ to 3+ pitting edema with tight shiny skin. There is an Ace wrap to the  left leg. Respiratory: Bibasilar crackles, oxygen dependent GI: soft, nontender, nondistended, some ballottement is noted on palpation + BS MS: no deformity or atrophy  Skin: warm and dry, no rash Neuro:  Strength and sensation are intact Psych: euthymic mood, flat affec  Recent Labs: 06/26/2016: ALT 9; Brain Natriuretic Peptide 5.6; BUN 12; Creat 1.24; Hemoglobin 11.3; Platelets 312; Potassium 4.2; Sodium 138; TSH 0.58    Wt Readings from Last 3 Encounters:  09/11/16 236 lb (107 kg)  08/11/16 225 lb (102.1 kg)  07/31/16 226 lb (102.5 kg)     ASSESSMENT AND PLAN:  1. Acute on chronic diastolic heart failure: Most likely related to dietary noncompliance eating a good bit of salty foods. He has gained approximately 12 pounds since being seen last. He has significant lower extremity edema and abdominal fullness without distention. His primary care physician has gone up on his Lasix dose, 80 mg daily alternating with 120 mg daily, but he is not had any improvement in his weight or diuresis.  I have counseled him and his family concerning his eating habits he is to avoid salted foods deli products and fried foods that he obtains from fast food places. I've also begun him on 2 doses of metolazone 2.5 mg one today and one tomorrow, he is to continue his current dose of Lasix, IV increased his potassium to 40 mEq daily for 2 days. Follow-up BMET in 4 days. If he continues to gain weight or does not have any diuresis from this management he will need to come to the hospital to be admitted for IV diuresis.  2. History of renal disease: Follow-up labs are planned in 4 days.  3. Oxygen independent respiratory failure: To follow up with primary care for ongoing management.   Current medicines are reviewed at length with the patient today.    Labs/ tests ordered today include: BMET   Orders Placed This Encounter  Procedures  . Basic metabolic panel     Disposition:   FU with  2 weeks.   Signed, Jory Sims, NP  09/11/2016 3:05 PM    [facility] [address], [place], [place] [address] Phone: [phone]; Fax: [phone]

## 2016-09-11 NOTE — Progress Notes (Signed)
Name: James Zamora    DOB: 06/17/1956  Age: 60 y.o.  MR#: [id number]       PCP:  Maggie Font, MD      Insurance: Payor: MEDICARE / Plan: MEDICARE PART A AND B / Product Type: *No Product type* /   CC:    Chief Complaint  Patient presents with  . Congestive Heart Failure    VS Vitals:   09/11/16 1349  BP: 124/84  Pulse: 91  Weight: 236 lb (107 kg)  Height: '5\' 6"'$  (1.676 m)    Weights Current Weight  09/11/16 236 lb (107 kg)  08/11/16 225 lb (102.1 kg)  07/31/16 226 lb (102.5 kg)    Blood Pressure  BP Readings from Last 3 Encounters:  09/11/16 124/84  08/11/16 96/64  07/31/16 127/73     Admit date:  (Not on file) Last encounter with RMR:  08/11/2016   Allergy Nutritional supplements and Penicillins  Current Outpatient Prescriptions  Medication Sig Dispense Refill  . acetaminophen (TYLENOL) 500 MG tablet Take 1,000 mg by mouth every 6 (six) hours as needed for mild pain or headache.    Marland Kitchen aspirin EC 81 MG EC tablet Take 1 tablet (81 mg total) by mouth daily. 30 tablet 1  . carvedilol (COREG) 6.25 MG tablet Take 1 tablet (6.25 mg total) by mouth 2 (two) times daily with a meal. 60 tablet 1  . furosemide (LASIX) 40 MG tablet Take 60 mg ( 1 1/2 pills) every OTHER  day, 40 mg (1 pill)  on alternate days 115 tablet 3  . lisinopril (PRINIVIL,ZESTRIL) 5 MG tablet Take 1 tablet (5 mg total) by mouth daily. 30 tablet 1  . potassium chloride (K-DUR) 10 MEQ tablet Take 10 meq (1 pill) when you take lasix 60 mg (take KCL every OTHER day) 45 tablet 3  . vitamin B-12 (CYANOCOBALAMIN) 1000 MCG tablet Take 1 tablet (1,000 mcg total) by mouth daily. 30 tablet 1   No current facility-administered medications for this visit.     Discontinued Meds:   There are no discontinued medications.  Patient Active Problem List   Diagnosis Date Noted  . Swelling   . Acute diastolic CHF (congestive heart failure) ([place]) 06/07/2016  . Emesis 06/07/2016  . Generalized abdominal pain 06/07/2016   . Anemia 06/07/2016  . Dehydration 11/13/2015  . AKI (acute kidney injury) ([place]) 11/13/2015  . Wound infection 11/13/2015  . Pulmonary nodule 11/13/2015    LABS    Component Value Date/Time   NA 138 06/26/2016 1450   NA 133 (L) 06/11/2016 0554   NA 130 (L) 06/10/2016 0543   K 4.2 06/26/2016 1450   K 3.8 06/11/2016 0554   K 3.6 06/10/2016 0543   CL 98 06/26/2016 1450   CL 93 (L) 06/11/2016 0554   CL 92 (L) 06/10/2016 0543   CO2 34 (H) 06/26/2016 1450   CO2 34 (H) 06/11/2016 0554   CO2 32 06/10/2016 0543   GLUCOSE 85 06/26/2016 1450   GLUCOSE 91 06/11/2016 0554   GLUCOSE 82 06/10/2016 0543   BUN 12 06/26/2016 1450   BUN 15 06/11/2016 0554   BUN 20 06/10/2016 0543   CREATININE 1.24 06/26/2016 1450   CREATININE 1.14 06/11/2016 0554   CREATININE 1.41 (H) 06/10/2016 0543   CREATININE 1.40 (H) 06/09/2016 0558   CALCIUM 9.0 06/26/2016 1450   CALCIUM 8.1 (L) 06/11/2016 0554   CALCIUM 8.0 (L) 06/10/2016 0543   GFRNONAA >60 06/11/2016 0554   GFRNONAA 53 (L)  06/10/2016 0543   GFRNONAA 53 (L) 06/09/2016 0558   GFRAA >60 06/11/2016 0554   GFRAA >60 06/10/2016 0543   GFRAA >60 06/09/2016 0558   CMP     Component Value Date/Time   NA 138 06/26/2016 1450   K 4.2 06/26/2016 1450   CL 98 06/26/2016 1450   CO2 34 (H) 06/26/2016 1450   GLUCOSE 85 06/26/2016 1450   BUN 12 06/26/2016 1450   CREATININE 1.24 06/26/2016 1450   CALCIUM 9.0 06/26/2016 1450   PROT 7.8 06/26/2016 1450   ALBUMIN 3.6 06/26/2016 1450   AST 14 06/26/2016 1450   ALT 9 06/26/2016 1450   ALKPHOS 72 06/26/2016 1450   BILITOT 0.3 06/26/2016 1450   GFRNONAA >60 06/11/2016 0554   GFRAA >60 06/11/2016 0554       Component Value Date/Time   WBC 4.8 06/26/2016 1450   WBC 4.8 06/11/2016 0554   WBC 7.4 06/07/2016 1140   HGB 11.3 (L) 06/26/2016 1450   HGB 11.1 (L) 06/11/2016 0554   HGB 10.9 (L) 06/07/2016 1140   HCT 39.0 06/26/2016 1450   HCT 38.8 (L) 06/11/2016 0554   HCT 37.5 (L) 06/07/2016 1140   MCV  68.8 (L) 06/26/2016 1450   MCV 68.2 (L) 06/11/2016 0554   MCV 67.4 (L) 06/07/2016 1140    Lipid Panel  No results found for: CHOL, TRIG, HDL, CHOLHDL, VLDL, LDLCALC, LDLDIRECT  ABG    Component Value Date/Time   TCO2 26 11/13/2015 0207     Lab Results  Component Value Date   TSH 0.58 06/26/2016   BNP (last 3 results)  Recent Labs  06/07/16 0415 06/26/16 1450  BNP 14.0 5.6    ProBNP (last 3 results) No results for input(s): PROBNP in the last 8760 hours.  Cardiac Panel (last 3 results) No results for input(s): CKTOTAL, CKMB, TROPONINI, RELINDX in the last 72 hours.  Iron/TIBC/Ferritin/ %Sat    Component Value Date/Time   IRON 34 (L) 07/06/2016 1133   TIBC 454 (H) 06/07/2016 1140   FERRITIN 75 07/06/2016 1133   IRONPCTSAT 4 (L) 06/07/2016 1140     EKG Orders placed or performed during the hospital encounter of 06/07/16  . ED EKG  . ED EKG  . EKG 12-Lead  . EKG 12-Lead  . EKG     Prior Assessment and Plan Problem List as of 09/11/2016 Reviewed: 08/11/2016  5:02 PM by Jory Sims, NP     Cardiovascular and Mediastinum   Acute diastolic CHF (congestive heart failure) ([place])     Genitourinary   AKI (acute kidney injury) ([place])     Other   Dehydration   Wound infection   Pulmonary nodule   Emesis   Generalized abdominal pain   Anemia   Last Assessment & Plan 07/06/2016 Office Visit Written 07/06/2016 10:50 AM by Carlis Stable, NP    Patient with noted microcytic anemia, no iron studies done yet. We will check iron and ferritin today. He is never had a colonoscopy or upper endoscopy before. He will likely need colonoscopy and EGD. Potentially needs Givens capsule if no findings on endoscopic evaluation. Prior to procedure will need to request clearance from cardiology related to heart failure. He is on oxygen but states it is only as needed if he is up and moving around. Return for follow-up in 4 months.  Proceed with colonoscopy and EGD with Dr. Oneida Alar in the  near future. The risks, benefits, and alternatives have been discussed in detail with the  patient. They state understanding and desire to proceed.   The patient is not on any anticoagulants, anxiolytics, chronic pain medications, or antidepressants. Conscious sedation should be adequate for his procedure.      Swelling       Imaging: No results found.

## 2016-09-11 NOTE — Patient Instructions (Signed)
Your physician recommends that you schedule a follow-up appointment in: 2 Weeks.   Your physician has recommended you make the following change in your medication:   Lasix 40 mg Take 2 Tablets (80 mg) on One Day and on Alternating Days Take 3 Tablets (120 mg)  Take   Take Metolazone 2.5 mg Today and Tomorrow.   Take Potassium Two Times Daily for Two Days Then return to One Time Daily   Your physician recommends that you return for lab work On Friday (BMET)   If you need a refill on your cardiac medications before your next appointment, please call your pharmacy.  Thank you for choosing [facility]!

## 2016-09-12 DIAGNOSIS — J45909 Unspecified asthma, uncomplicated: Secondary | ICD-10-CM | POA: Diagnosis not present

## 2016-09-12 DIAGNOSIS — I5030 Unspecified diastolic (congestive) heart failure: Secondary | ICD-10-CM | POA: Diagnosis not present

## 2016-09-12 DIAGNOSIS — R238 Other skin changes: Secondary | ICD-10-CM | POA: Diagnosis not present

## 2016-09-12 DIAGNOSIS — Z9981 Dependence on supplemental oxygen: Secondary | ICD-10-CM | POA: Diagnosis not present

## 2016-09-12 DIAGNOSIS — Z7982 Long term (current) use of aspirin: Secondary | ICD-10-CM | POA: Diagnosis not present

## 2016-09-12 DIAGNOSIS — D649 Anemia, unspecified: Secondary | ICD-10-CM | POA: Diagnosis not present

## 2016-09-13 ENCOUNTER — Encounter: Payer: Self-pay | Admitting: Adult Health

## 2016-09-15 DIAGNOSIS — Z79899 Other long term (current) drug therapy: Secondary | ICD-10-CM | POA: Diagnosis not present

## 2016-09-16 LAB — BASIC METABOLIC PANEL
BUN: 15 mg/dL (ref 7–25)
CALCIUM: 9.5 mg/dL (ref 8.6–10.3)
CHLORIDE: 95 mmol/L — AB (ref 98–110)
CO2: 34 mmol/L — ABNORMAL HIGH (ref 20–31)
CREATININE: 1.48 mg/dL — AB (ref 0.70–1.25)
Glucose, Bld: 97 mg/dL (ref 65–99)
Potassium: 3.8 mmol/L (ref 3.5–5.3)
Sodium: 139 mmol/L (ref 135–146)

## 2016-09-19 DIAGNOSIS — Z7982 Long term (current) use of aspirin: Secondary | ICD-10-CM | POA: Diagnosis not present

## 2016-09-19 DIAGNOSIS — I5030 Unspecified diastolic (congestive) heart failure: Secondary | ICD-10-CM | POA: Diagnosis not present

## 2016-09-19 DIAGNOSIS — D649 Anemia, unspecified: Secondary | ICD-10-CM | POA: Diagnosis not present

## 2016-09-19 DIAGNOSIS — R238 Other skin changes: Secondary | ICD-10-CM | POA: Diagnosis not present

## 2016-09-19 DIAGNOSIS — Z9981 Dependence on supplemental oxygen: Secondary | ICD-10-CM | POA: Diagnosis not present

## 2016-09-19 DIAGNOSIS — J45909 Unspecified asthma, uncomplicated: Secondary | ICD-10-CM | POA: Diagnosis not present

## 2016-09-26 ENCOUNTER — Telehealth: Payer: Self-pay | Admitting: *Deleted

## 2016-09-26 DIAGNOSIS — I5030 Unspecified diastolic (congestive) heart failure: Secondary | ICD-10-CM | POA: Diagnosis not present

## 2016-09-26 DIAGNOSIS — R238 Other skin changes: Secondary | ICD-10-CM | POA: Diagnosis not present

## 2016-09-26 DIAGNOSIS — Z9981 Dependence on supplemental oxygen: Secondary | ICD-10-CM | POA: Diagnosis not present

## 2016-09-26 DIAGNOSIS — Z7982 Long term (current) use of aspirin: Secondary | ICD-10-CM | POA: Diagnosis not present

## 2016-09-26 DIAGNOSIS — D649 Anemia, unspecified: Secondary | ICD-10-CM | POA: Diagnosis not present

## 2016-09-26 DIAGNOSIS — J45909 Unspecified asthma, uncomplicated: Secondary | ICD-10-CM | POA: Diagnosis not present

## 2016-09-26 NOTE — Telephone Encounter (Signed)
Home health nurse Kathlee Nations called and states that Pt. Current wt. Is 218.4 lbs., Hr 104, BP 106/82, 94% O2 2L. Nurse Kathlee Nations is requesting compression stockings called in to [facility]. She states he had lower leg wound that has now healed. Please advise.

## 2016-09-26 NOTE — Telephone Encounter (Signed)
OK to call in Rx for compression hose

## 2016-09-27 NOTE — Telephone Encounter (Signed)
Rx called in to [facility].  

## 2016-09-29 ENCOUNTER — Ambulatory Visit: Payer: Medicare Other | Admitting: Adult Health

## 2016-10-03 DIAGNOSIS — I5030 Unspecified diastolic (congestive) heart failure: Secondary | ICD-10-CM | POA: Diagnosis not present

## 2016-10-03 DIAGNOSIS — Z7982 Long term (current) use of aspirin: Secondary | ICD-10-CM | POA: Diagnosis not present

## 2016-10-03 DIAGNOSIS — R0902 Hypoxemia: Secondary | ICD-10-CM | POA: Diagnosis not present

## 2016-10-03 DIAGNOSIS — Z9981 Dependence on supplemental oxygen: Secondary | ICD-10-CM | POA: Diagnosis not present

## 2016-10-03 DIAGNOSIS — D649 Anemia, unspecified: Secondary | ICD-10-CM | POA: Diagnosis not present

## 2016-10-03 DIAGNOSIS — I5031 Acute diastolic (congestive) heart failure: Secondary | ICD-10-CM | POA: Diagnosis not present

## 2016-10-03 DIAGNOSIS — J45909 Unspecified asthma, uncomplicated: Secondary | ICD-10-CM | POA: Diagnosis not present

## 2016-10-03 DIAGNOSIS — R238 Other skin changes: Secondary | ICD-10-CM | POA: Diagnosis not present

## 2016-10-06 ENCOUNTER — Other Ambulatory Visit: Payer: Self-pay

## 2016-10-06 DIAGNOSIS — D649 Anemia, unspecified: Secondary | ICD-10-CM

## 2016-10-10 DIAGNOSIS — Z7982 Long term (current) use of aspirin: Secondary | ICD-10-CM | POA: Diagnosis not present

## 2016-10-10 DIAGNOSIS — Z9981 Dependence on supplemental oxygen: Secondary | ICD-10-CM | POA: Diagnosis not present

## 2016-10-10 DIAGNOSIS — I5030 Unspecified diastolic (congestive) heart failure: Secondary | ICD-10-CM | POA: Diagnosis not present

## 2016-10-10 DIAGNOSIS — D649 Anemia, unspecified: Secondary | ICD-10-CM | POA: Diagnosis not present

## 2016-10-10 DIAGNOSIS — J45909 Unspecified asthma, uncomplicated: Secondary | ICD-10-CM | POA: Diagnosis not present

## 2016-10-10 DIAGNOSIS — R238 Other skin changes: Secondary | ICD-10-CM | POA: Diagnosis not present

## 2016-10-12 ENCOUNTER — Ambulatory Visit: Payer: Medicare Other | Admitting: Adult Health

## 2016-10-12 NOTE — Progress Notes (Deleted)
Cardiology Office Note   Date:  10/12/2016   ID:  Zamora, James 1956-07-25, MRN [id number]  PCP:  Maggie Font, MD  Cardiologist:  Ross/ Jory Sims, NP   No chief complaint on file.     History of Present Illness: James Zamora is a 60 y.o. male who presents for ongoing assessment and management of chronic dyspnea, diastolic heart failure, medical noncompliance due to confusion about dosing, dietary noncompliance. Oxygen-dependent lung disease.  On last office visit on 09/11/2016 he continued to have some edema and symptomatic shortness of breath. He was counseled on low sodium diet and placed on metolazone 2.5 mg for 2 days and to continue his current dose of Lasix of 120 the morning and 80 in the afternoon, he was also to increase his potassium for the 2 days he was on metolazone. He follow-up BMET was ordered. If he continued to gain weight, or have no improvement in his edema, he would have been admitted for IV diuresis.  Follow-up lab on 09/15/2016 revealed a sodium 139, potassium 3.8, chloride 95, CO2 34, BUN 15, creatinine 1.48.  Past Medical History:  Diagnosis Date  . Asthma   . CHF (congestive heart failure) ([place])   . Perforated bowel [facility])     Past Surgical History:  Procedure Laterality Date  . CHOLECYSTECTOMY    . COLONOSCOPY N/A 07/31/2016   Procedure: COLONOSCOPY;  Surgeon: Danie Binder, MD;  Location: [facility];  Service: Endoscopy;  Laterality: N/A;  1:45 pm  . ESOPHAGOGASTRODUODENOSCOPY N/A 07/31/2016   Procedure: ESOPHAGOGASTRODUODENOSCOPY (EGD);  Surgeon: Danie Binder, MD;  Location: [facility]O [facility];  Service: Endoscopy;  Laterality: N/A;     Current Outpatient Prescriptions  Medication Sig Dispense Refill  . acetaminophen (TYLENOL) 500 MG tablet Take 1,000 mg by mouth every 6 (six) hours as needed for mild pain or headache.    Marland Kitchen aspirin EC 81 MG EC tablet Take 1 tablet (81 mg total) by mouth daily. 30 tablet 1  . carvedilol (COREG)  6.25 MG tablet Take 1 tablet (6.25 mg total) by mouth 2 (two) times daily with a meal. 60 tablet 1  . furosemide (LASIX) 40 MG tablet Take 60 mg ( 1 1/2 pills) every OTHER  day, 40 mg (1 pill)  on alternate days 115 tablet 3  . furosemide (LASIX) 40 MG tablet Take 2 Tablets (80 mg)  One Day Alternating Days and Take 3 Tablets (120 mg) on the Other Day 90 tablet 6  . lisinopril (PRINIVIL,ZESTRIL) 5 MG tablet Take 1 tablet (5 mg total) by mouth daily. 30 tablet 1  . metolazone (ZAROXOLYN) 2.5 MG tablet Take 1 tablet (2.5 mg total) by mouth daily. 5 tablet 0  . potassium chloride (K-DUR) 10 MEQ tablet Take 10 meq (1 pill) when you take lasix 60 mg (take KCL every OTHER day) 45 tablet 3  . vitamin B-12 (CYANOCOBALAMIN) 1000 MCG tablet Take 1 tablet (1,000 mcg total) by mouth daily. 30 tablet 1   No current facility-administered medications for this visit.     Allergies:   Nutritional supplements and Penicillins    Social History:  The patient  reports that he quit smoking about 2 years ago. He quit smokeless tobacco use about 2 years ago. His smokeless tobacco use included Chew. He reports that he does not drink alcohol or use drugs.   Family History:  The patient's family history includes Asthma in his father; Diabetes in his mother.  ROS: All other systems are reviewed and negative. Unless otherwise mentioned in H&P    PHYSICAL EXAM: VS:  There were no vitals taken for this visit. , BMI There is no height or weight on file to calculate BMI. GEN: Well nourished, well developed, in no acute distress HEENT: normal Neck: no JVD, carotid bruits, or masses Cardiac: ***RRR; no murmurs, rubs, or gallops,no edema  Respiratory:  clear to auscultation bilaterally, normal work of breathing GI: soft, nontender, nondistended, + BS MS: no deformity or atrophy Skin: warm and dry, no rash Neuro:  Strength and sensation are intact Psych: euthymic mood, full affect   EKG:  EKG {ACTION; IS/IS  [id number]} ordered today. The ekg ordered today demonstrates ***   Recent Labs: 06/26/2016: ALT 9; Brain Natriuretic Peptide 5.6; Hemoglobin 11.3; Platelets 312; TSH 0.58 09/15/2016: BUN 15; Creat 1.48; Potassium 3.8; Sodium 139    Lipid Panel No results found for: CHOL, TRIG, HDL, CHOLHDL, VLDL, LDLCALC, LDLDIRECT    Wt Readings from Last 3 Encounters:  09/11/16 236 lb (107 kg)  08/11/16 225 lb (102.1 kg)  07/31/16 226 lb (102.5 kg)      Other studies Reviewed: Additional studies/ records that were reviewed today include: ***. Review of the above records demonstrates: ***   ASSESSMENT AND PLAN:  1.  ***   Current medicines are reviewed at length with the patient today.    Labs/ tests ordered today include: *** No orders of the defined types were placed in this encounter.    Disposition:   FU with *** in {gen number [id number]} {TIME; UNITS DAY/WEEK/MONTH:19136}   Signed, Jory Sims, NP  10/12/2016 6:58 AM    [facility] [address] [place],  [address] Phone: [phone]; Fax: [phone]

## 2016-10-26 ENCOUNTER — Encounter: Payer: Self-pay | Admitting: Adult Health

## 2016-10-26 ENCOUNTER — Ambulatory Visit (INDEPENDENT_AMBULATORY_CARE_PROVIDER_SITE_OTHER): Payer: Medicare Other | Admitting: Adult Health

## 2016-10-26 VITALS — BP 110/70 | HR 76 | Ht 67.0 in | Wt 224.0 lb

## 2016-10-26 DIAGNOSIS — Z79899 Other long term (current) drug therapy: Secondary | ICD-10-CM

## 2016-10-26 DIAGNOSIS — I5032 Chronic diastolic (congestive) heart failure: Secondary | ICD-10-CM | POA: Diagnosis not present

## 2016-10-26 DIAGNOSIS — I10 Essential (primary) hypertension: Secondary | ICD-10-CM

## 2016-10-26 NOTE — Progress Notes (Signed)
Name: James Zamora    DOB: 04/21/1956  Age: 60 y.o.  MR#: [id number]       PCP:  Maggie Font, MD      Insurance: Payor: MEDICARE / Plan: MEDICARE PART A AND B / Product Type: *No Product type* /   CC:   No chief complaint on file.   VS Vitals:   10/26/16 1609  Weight: 224 lb (101.6 kg)  Height: '5\' 7"'$  (1.702 m)    Weights Current Weight  10/26/16 224 lb (101.6 kg)  09/11/16 236 lb (107 kg)  08/11/16 225 lb (102.1 kg)    Blood Pressure  BP Readings from Last 3 Encounters:  09/11/16 124/84  08/11/16 96/64  07/31/16 127/73     Admit date:  (Not on file) Last encounter with RMR:  09/11/2016   Allergy Nutritional supplements and Penicillins  Current Outpatient Prescriptions  Medication Sig Dispense Refill  . acetaminophen (TYLENOL) 500 MG tablet Take 1,000 mg by mouth every 6 (six) hours as needed for mild pain or headache.    Marland Kitchen aspirin EC 81 MG EC tablet Take 1 tablet (81 mg total) by mouth daily. 30 tablet 1  . carvedilol (COREG) 6.25 MG tablet Take 1 tablet (6.25 mg total) by mouth 2 (two) times daily with a meal. 60 tablet 1  . furosemide (LASIX) 40 MG tablet Take 60 mg ( 1 1/2 pills) every OTHER  day, 40 mg (1 pill)  on alternate days 115 tablet 3  . furosemide (LASIX) 40 MG tablet Take 2 Tablets (80 mg)  One Day Alternating Days and Take 3 Tablets (120 mg) on the Other Day 90 tablet 6  . lisinopril (PRINIVIL,ZESTRIL) 5 MG tablet Take 1 tablet (5 mg total) by mouth daily. 30 tablet 1  . metolazone (ZAROXOLYN) 2.5 MG tablet Take 1 tablet (2.5 mg total) by mouth daily. 5 tablet 0  . potassium chloride (K-DUR) 10 MEQ tablet Take 10 meq (1 pill) when you take lasix 60 mg (take KCL every OTHER day) 45 tablet 3  . vitamin B-12 (CYANOCOBALAMIN) 1000 MCG tablet Take 1 tablet (1,000 mcg total) by mouth daily. 30 tablet 1   No current facility-administered medications for this visit.     Discontinued Meds:   There are no discontinued medications.  Patient Active Problem  List   Diagnosis Date Noted  . Swelling   . Acute diastolic CHF (congestive heart failure) ([place]) 06/07/2016  . Emesis 06/07/2016  . Generalized abdominal pain 06/07/2016  . Anemia 06/07/2016  . Dehydration 11/13/2015  . AKI (acute kidney injury) ([place]) 11/13/2015  . Wound infection 11/13/2015  . Pulmonary nodule 11/13/2015    LABS    Component Value Date/Time   NA 139 09/15/2016 1518   NA 138 06/26/2016 1450   NA 133 (L) 06/11/2016 0554   K 3.8 09/15/2016 1518   K 4.2 06/26/2016 1450   K 3.8 06/11/2016 0554   CL 95 (L) 09/15/2016 1518   CL 98 06/26/2016 1450   CL 93 (L) 06/11/2016 0554   CO2 34 (H) 09/15/2016 1518   CO2 34 (H) 06/26/2016 1450   CO2 34 (H) 06/11/2016 0554   GLUCOSE 97 09/15/2016 1518   GLUCOSE 85 06/26/2016 1450   GLUCOSE 91 06/11/2016 0554   BUN 15 09/15/2016 1518   BUN 12 06/26/2016 1450   BUN 15 06/11/2016 0554   CREATININE 1.48 (H) 09/15/2016 1518   CREATININE 1.24 06/26/2016 1450   CREATININE 1.14 06/11/2016 0554   CREATININE  1.41 (H) 06/10/2016 0543   CREATININE 1.40 (H) 06/09/2016 0558   CALCIUM 9.5 09/15/2016 1518   CALCIUM 9.0 06/26/2016 1450   CALCIUM 8.1 (L) 06/11/2016 0554   GFRNONAA >60 06/11/2016 0554   GFRNONAA 53 (L) 06/10/2016 0543   GFRNONAA 53 (L) 06/09/2016 0558   GFRAA >60 06/11/2016 0554   GFRAA >60 06/10/2016 0543   GFRAA >60 06/09/2016 0558   CMP     Component Value Date/Time   NA 139 09/15/2016 1518   K 3.8 09/15/2016 1518   CL 95 (L) 09/15/2016 1518   CO2 34 (H) 09/15/2016 1518   GLUCOSE 97 09/15/2016 1518   BUN 15 09/15/2016 1518   CREATININE 1.48 (H) 09/15/2016 1518   CALCIUM 9.5 09/15/2016 1518   PROT 7.8 06/26/2016 1450   ALBUMIN 3.6 06/26/2016 1450   AST 14 06/26/2016 1450   ALT 9 06/26/2016 1450   ALKPHOS 72 06/26/2016 1450   BILITOT 0.3 06/26/2016 1450   GFRNONAA >60 06/11/2016 0554   GFRAA >60 06/11/2016 0554       Component Value Date/Time   WBC 4.8 06/26/2016 1450   WBC 4.8 06/11/2016 0554    WBC 7.4 06/07/2016 1140   HGB 11.3 (L) 06/26/2016 1450   HGB 11.1 (L) 06/11/2016 0554   HGB 10.9 (L) 06/07/2016 1140   HCT 39.0 06/26/2016 1450   HCT 38.8 (L) 06/11/2016 0554   HCT 37.5 (L) 06/07/2016 1140   MCV 68.8 (L) 06/26/2016 1450   MCV 68.2 (L) 06/11/2016 0554   MCV 67.4 (L) 06/07/2016 1140    Lipid Panel  No results found for: CHOL, TRIG, HDL, CHOLHDL, VLDL, LDLCALC, LDLDIRECT  ABG    Component Value Date/Time   TCO2 26 11/13/2015 0207     Lab Results  Component Value Date   TSH 0.58 06/26/2016   BNP (last 3 results)  Recent Labs  06/07/16 0415 06/26/16 1450  BNP 14.0 5.6    ProBNP (last 3 results) No results for input(s): PROBNP in the last 8760 hours.  Cardiac Panel (last 3 results) No results for input(s): CKTOTAL, CKMB, TROPONINI, RELINDX in the last 72 hours.  Iron/TIBC/Ferritin/ %Sat    Component Value Date/Time   IRON 34 (L) 07/06/2016 1133   TIBC 454 (H) 06/07/2016 1140   FERRITIN 75 07/06/2016 1133   IRONPCTSAT 4 (L) 06/07/2016 1140     EKG Orders placed or performed during the hospital encounter of 06/07/16  . ED EKG  . ED EKG  . EKG 12-Lead  . EKG 12-Lead  . EKG     Prior Assessment and Plan Problem List as of 10/26/2016 Reviewed: 09/11/2016  3:12 PM by Jory Sims, NP     Cardiovascular and Mediastinum   Acute diastolic CHF (congestive heart failure) ([place])     Genitourinary   AKI (acute kidney injury) ([place])     Other   Dehydration   Wound infection   Pulmonary nodule   Emesis   Generalized abdominal pain   Anemia   Last Assessment & Plan 07/06/2016 Office Visit Written 07/06/2016 10:50 AM by Carlis Stable, NP    Patient with noted microcytic anemia, no iron studies done yet. We will check iron and ferritin today. He is never had a colonoscopy or upper endoscopy before. He will likely need colonoscopy and EGD. Potentially needs Givens capsule if no findings on endoscopic evaluation. Prior to procedure will need to request  clearance from cardiology related to heart failure. He is on oxygen but states  it is only as needed if he is up and moving around. Return for follow-up in 4 months.  Proceed with colonoscopy and EGD with Dr. Oneida Alar in the near future. The risks, benefits, and alternatives have been discussed in detail with the patient. They state understanding and desire to proceed.   The patient is not on any anticoagulants, anxiolytics, chronic pain medications, or antidepressants. Conscious sedation should be adequate for his procedure.      Swelling       Imaging: No results found.

## 2016-10-26 NOTE — Progress Notes (Signed)
Cardiology Office Note   Date:  10/26/2016   ID:  [name], [name] [birth year], MRN [id number]  PCP:  Maggie Font, MD  Cardiologist:  Ross/ Jory Sims, NP   Chief Complaint  Patient presents with  . Congestive Heart Failure    History of Present Illness: James Zamora is a 60 y.o. male who presents for ongoing assessment and management of chronic dyspnea, diastolic heart failure, with frequent episodes of decompensation due to dietary noncompliance. On last office visit he had gained 12 pounds, he was given counseling on dietary restraints of salt, I gave him 2 Doses Metolazone 2.5 once a day for 2 days and to continue current dose of lasix. I Increased his potassium to 40 MEq a day for 2 days while on the metolazone dosing. Follow-Up BMET was ordered. If he continued to gain weight he was to present to the ER for IV duresis.  Na 139; K 3.8;Cl 95; Creatinine 1.40.  His weight is down 12 pounds since being seen last. He is diuresed very well. His sister who is with him states that his weight is actually come down 15 pounds. He has become more medically compliant and is avoiding salt. He has taken responsibility for his eating habits and does not wish to go back into the hospital. He has not taken his Lasix today as he has had to doctor's appointments, but normally takes them every day, weighing every day. He states his weight at home is 217-219 lbs.  Past Medical History:  Diagnosis Date  . Asthma   . CHF (congestive heart failure) ([place])   . Perforated bowel [facility])     Past Surgical History:  Procedure Laterality Date  . CHOLECYSTECTOMY    . COLONOSCOPY N/A 07/31/2016   Procedure: COLONOSCOPY;  Surgeon: Danie Binder, MD;  [facility];  Service: Endoscopy;  Laterality: N/A;  1:45 pm  . ESOPHAGOGASTRODUODENOSCOPY N/A 07/31/2016   Procedure: ESOPHAGOGASTRODUODENOSCOPY (EGD);  Surgeon: Danie Binder, MD;  Location[facility];  Service: Endoscopy;   Laterality: N/A;     Current Outpatient Prescriptions  Medication Sig Dispense Refill  . acetaminophen (TYLENOL) 500 MG tablet Take 1,000 mg by mouth every 6 (six) hours as needed for mild pain or headache.    Marland Kitchen aspirin EC 81 MG EC tablet Take 1 tablet (81 mg total) by mouth daily. 30 tablet 1  . carvedilol (COREG) 6.25 MG tablet Take 1 tablet (6.25 mg total) by mouth 2 (two) times daily with a meal. 60 tablet 1  . furosemide (LASIX) 40 MG tablet Take 60 mg ( 1 1/2 pills) every OTHER  day, 40 mg (1 pill)  on alternate days 115 tablet 3  . furosemide (LASIX) 40 MG tablet Take 2 Tablets (80 mg)  One Day Alternating Days and Take 3 Tablets (120 mg) on the Other Day 90 tablet 6  . lisinopril (PRINIVIL,ZESTRIL) 5 MG tablet Take 1 tablet (5 mg total) by mouth daily. 30 tablet 1  . metolazone (ZAROXOLYN) 2.5 MG tablet Take 1 tablet (2.5 mg total) by mouth daily. 5 tablet 0  . potassium chloride (K-DUR) 10 MEQ tablet Take 10 meq (1 pill) when you take lasix 60 mg (take KCL every OTHER day) 45 tablet 3  . vitamin B-12 (CYANOCOBALAMIN) 1000 MCG tablet Take 1 tablet (1,000 mcg total) by mouth daily. 30 tablet 1   No current facility-administered medications for this visit.     Allergies:   Nutritional supplements and  Penicillins    Social History:  The patient  reports that he quit smoking about 2 years ago. He quit smokeless tobacco use about 2 years ago. His smokeless tobacco use included Chew. He reports that he does not drink alcohol or use drugs.   Family History:  The patient's family history includes Asthma in his father; Diabetes in his mother.    ROS: All other systems are reviewed and negative. Unless otherwise mentioned in H&P    PHYSICAL EXAM: VS:  BP 110/70   Pulse 76   Ht '5\' 7"'$  (1.702 m)   Wt 224 lb (101.6 kg)   SpO2 97%   BMI 35.08 kg/m  , BMI Body mass index is 35.08 kg/m. GEN: Well nourished, well developed, in no acute distress  HEENT: normal  Neck: no JVD, carotid  bruits, or masses Cardiac: RRR; no murmurs, rubs, or gallops,no edema  Respiratory: Bilateral crackles in the bases. Wearing O2 2/liters via portable tank.  GI: soft, nontender, nondistended, + BS MS: no deformity or atrophy  left lower extremity with woody edema, some excoriated skin which is healing. Right leg is normal with minimal ankle edema. Skin: warm and dry, no rash Neuro:  Strength and sensation are intact Psych: euthymic mood, full affect   Recent Labs: 06/26/2016: ALT 9; Brain Natriuretic Peptide 5.6; Hemoglobin 11.3; Platelets 312; TSH 0.58 09/15/2016: BUN 15; Creat 1.48; Potassium 3.8; Sodium 139    Lipid Panel   Wt Readings from Last 3 Encounters:  10/26/16 224 lb (101.6 kg)  09/11/16 236 lb (107 kg)  08/11/16 225 lb (102.1 kg)      Other studies Reviewed: Additional studies/ records that were reviewed today include: . Echocardiogram:  Left ventricle: The cavity size was normal. Wall thickness was   increased in a pattern of moderate LVH. Systolic function was   normal. The estimated ejection fraction was in the range of 60%   to 65%. Wall motion was normal; there were no regional wall   motion abnormalities. Left ventricular diastolic function   parameters were normal. - Aortic valve: Valve area (VTI): 2.72 cm^2. Valve area (Vmax):   2.72 cm^2. Valve area (Vmean): 2.71 cm^2. - Right ventricle: The cavity size was mildly dilated. - Atrial septum: No defect or patent foramen ovale was identified. - Technically adequate study.  ASSESSMENT AND PLAN:  1.  Chronic diastolic heart failure: Frequent exacerbations due to medical and dietary noncompliance. The patient has been much more strict on his salt intake and has been taking his medications daily. He is down 12-15 pounds depending upon the scale. His sister who is with him concurs that he has been very strict and avoiding salty foods. He has been repeatedly good bit of water as well. I congratulated the patient on  taking responsibility for his health maintenance and avoiding hospitalization. We'll see him again in one month for close follow-up and ongoing support and management. He will need a BMET prior to next appointment.  2. Hypertension: Blood pressure is very well controlled today. He will continue lisinopril 5 mg daily. Follow-up BMET.    Current medicines are reviewed at length with the patient today.    Labs/ tests ordered today include:   Orders Placed This Encounter  Procedures  . Basic Metabolic Panel (BMET)     Disposition:   FU with One month.   Signed, Jory Sims, NP  10/26/2016 4:51 PM    [facility] [address] [place], [place]  [place] Phone: [phone]; Fax: [phone]

## 2016-10-26 NOTE — Patient Instructions (Signed)
Your physician recommends that you schedule a follow-up appointment in: 1 Month  Your physician recommends that you continue on your current medications as directed. Please refer to the Current Medication list given to you today.  Your physician recommends that you return for lab work in: Just before your next visit.   If you need a refill on your cardiac medications before your next appointment, please call your pharmacy.  Thank you for choosing [facility]!

## 2016-11-07 ENCOUNTER — Ambulatory Visit: Payer: Medicare Other | Admitting: Nurse Practitioner

## 2016-11-07 ENCOUNTER — Ambulatory Visit: Payer: Medicare Other | Admitting: Gastroenterology

## 2016-11-13 ENCOUNTER — Ambulatory Visit: Payer: Medicare Other | Admitting: Gastroenterology

## 2016-11-27 ENCOUNTER — Encounter: Payer: Self-pay | Admitting: Gastroenterology

## 2016-11-27 ENCOUNTER — Other Ambulatory Visit: Payer: Self-pay

## 2016-11-27 ENCOUNTER — Other Ambulatory Visit: Payer: Self-pay | Admitting: Gastroenterology

## 2016-11-27 ENCOUNTER — Telehealth: Payer: Self-pay | Admitting: Gastroenterology

## 2016-11-27 ENCOUNTER — Ambulatory Visit (INDEPENDENT_AMBULATORY_CARE_PROVIDER_SITE_OTHER): Payer: Medicare Other | Admitting: Gastroenterology

## 2016-11-27 ENCOUNTER — Ambulatory Visit (HOSPITAL_COMMUNITY)
Admission: RE | Admit: 2016-11-27 | Discharge: 2016-11-27 | Disposition: A | Discharge status: Home / Self Care | Payer: Medicare Other | Source: Ambulatory Visit | Attending: Gastroenterology | Admitting: Gastroenterology

## 2016-11-27 VITALS — BP 119/72 | HR 91 | Temp 98.0°F | Ht 67.0 in | Wt 225.6 lb

## 2016-11-27 DIAGNOSIS — D508 Other iron deficiency anemias: Secondary | ICD-10-CM | POA: Diagnosis not present

## 2016-11-27 DIAGNOSIS — R600 Localized edema: Secondary | ICD-10-CM | POA: Insufficient documentation

## 2016-11-27 DIAGNOSIS — D649 Anemia, unspecified: Secondary | ICD-10-CM | POA: Diagnosis not present

## 2016-11-27 DIAGNOSIS — I824Y9 Acute embolism and thrombosis of unspecified deep veins of unspecified proximal lower extremity: Secondary | ICD-10-CM

## 2016-11-27 LAB — CBC WITH DIFFERENTIAL/PLATELET
Basophils Absolute: 0 cells/uL (ref 0–200)
Basophils Relative: 0 %
EOS ABS: 285 {cells}/uL (ref 15–500)
Eosinophils Relative: 5 %
HEMATOCRIT: 43.4 % (ref 38.5–50.0)
Hemoglobin: 13.4 g/dL (ref 13.2–17.1)
LYMPHS PCT: 41 %
Lymphs Abs: 2337 cells/uL (ref 850–3900)
MCH: 25 pg — ABNORMAL LOW (ref 27.0–33.0)
MCHC: 30.9 g/dL — AB (ref 32.0–36.0)
MCV: 80.8 fL (ref 80.0–100.0)
MONO ABS: 513 {cells}/uL (ref 200–950)
MPV: 10.6 fL (ref 7.5–12.5)
Monocytes Relative: 9 %
NEUTROS PCT: 45 %
Neutro Abs: 2565 cells/uL (ref 1500–7800)
Platelets: 308 10*3/uL (ref 140–400)
RBC: 5.37 MIL/uL (ref 4.20–5.80)
RDW: 15.1 % — AB (ref 11.0–15.0)
WBC: 5.7 10*3/uL (ref 3.8–10.8)

## 2016-11-27 LAB — FERRITIN: Ferritin: 97 ng/mL (ref 20–380)

## 2016-11-27 NOTE — Assessment & Plan Note (Signed)
H/o chronic lower extremity edema. Patient complains of worsening edema of the left lower leg. Wound has healed from before. No pain. Check venous u/s to rule out DVT today.

## 2016-11-27 NOTE — Progress Notes (Addendum)
REVIEWED-NO ADDITIONAL RECOMMENDATIONS. JAN 2018 NL CBC/FERRITIN   Primary Care Physician: Maggie Font, MD  Primary Gastroenterologist:  Barney Drain, MD   Chief Complaint  Patient presents with  . Anemia    f/u. doing ok    HPI: James Zamora is a 61 y.o. male here for follow-up of EGD and colonoscopy which was done for microcytic anemia with iron deficiency. He had gastritis but no H.pylori, two sessile tubular adenomas removed, 11-46m in size, small internal hemorrhoids. Next TCS in 3 years. Patient feels fine. No GI complaints. No melena, brbpr. Notes his left leg is more swollen than normal, worsened over "awhile". Poor historian. He did have venous u/s done of his left leg in 05/2016 without DVT but with abnormal left inguinal adenopathy. H/O left lower leg wound at that time and this has now healed.   Current Outpatient Prescriptions  Medication Sig Dispense Refill  . aspirin EC 81 MG EC tablet Take 1 tablet (81 mg total) by mouth daily. 30 tablet 1  . carvedilol (COREG) 6.25 MG tablet Take 1 tablet (6.25 mg total) by mouth 2 (two) times daily with a meal. 60 tablet 1  . furosemide (LASIX) 40 MG tablet Take 2 Tablets (80 mg)  One Day Alternating Days and Take 3 Tablets (120 mg) on the Other Day 90 tablet 6  . lisinopril (PRINIVIL,ZESTRIL) 5 MG tablet Take 1 tablet (5 mg total) by mouth daily. 30 tablet 1  . metolazone (ZAROXOLYN) 2.5 MG tablet Take 1 tablet (2.5 mg total) by mouth daily. 5 tablet 0  . potassium chloride (K-DUR) 10 MEQ tablet Take 10 meq (1 pill) when you take lasix 60 mg (take KCL every OTHER day) 45 tablet 3   No current facility-administered medications for this visit.     Allergies as of 11/27/2016 - Review Complete 11/27/2016  Allergen Reaction Noted  . Nutritional supplements Hives and Swelling 06/12/2011  . Penicillins  06/12/2011    ROS:  General: Negative for anorexia, weight loss, fever, chills, fatigue, weakness. ENT: Negative for  hoarseness, difficulty swallowing , nasal congestion. CV: Negative for chest pain, angina, palpitations, dyspnea on exertion, +++peripheral edema.  Respiratory: Negative for dyspnea at rest, dyspnea on exertion, cough, sputum, wheezing.  GI: See history of present illness. GU:  Negative for dysuria, hematuria, urinary incontinence, urinary frequency, nocturnal urination.  Endo: Negative for unusual weight change.    Physical Examination:   BP 119/72   Pulse 91   Temp 98 F (36.7 C) (Oral)   Ht '5\' 7"'$  (1.702 m)   Wt 225 lb 9.6 oz (102.3 kg)   BMI 35.33 kg/m   General: Well-nourished, well-developed in no acute distress.  Eyes: No icterus. Mouth: Oropharyngeal mucosa moist and pink , no lesions erythema or exudate. Lungs: Clear to auscultation bilaterally.  Heart: Regular rate and rhythm, no murmurs rubs or gallops.  Abdomen: Bowel sounds are normal, nontender, nondistended, no hepatosplenomegaly or masses, no abdominal bruits or hernia , no rebound or guarding.   Extremities: left leg > right without chronic venous stasis changes. Left leg 3+edema, right leg 1+. Overall the left leg is at least twice the size of the right.  No clubbing or deformities. Neuro: Alert and oriented x 4   Skin: Warm and dry, no jaundice.   Psych: Alert and cooperative, normal mood and affect.  Labs:  Lab Results  Component Value Date   WBC 4.8 06/26/2016   HGB 11.3 (L) 06/26/2016   HCT 39.0  06/26/2016   MCV 68.8 (L) 06/26/2016   PLT 312 06/26/2016   Lab Results  Component Value Date   IRON 34 (L) 07/06/2016   TIBC 454 (H) 06/07/2016   FERRITIN 75 07/06/2016    Imaging Studies: No results found.

## 2016-11-27 NOTE — Progress Notes (Signed)
Please let patient know that there is no blood clot but he has a lot of subcutanous edema at left ankle/left lower leg. He needs to follow up with PCP and please send copy of this result.

## 2016-11-27 NOTE — Progress Notes (Signed)
CC'D TO PCP °

## 2016-11-27 NOTE — Assessment & Plan Note (Signed)
Microcytic anemia with low iron. EGD with gastritis. TCS with two tubular adenomas removed. No overt GI bleeding and no GI symptoms. Patient to have labs done. If persistent IDA, consider small bowel capsule study to complete work up.

## 2016-11-27 NOTE — Patient Instructions (Signed)
1. Please have labs and u/s of your leg done today.  2. If you are still anemic, we will plan on small bowel capsule study as next step.

## 2016-11-27 NOTE — Telephone Encounter (Signed)
Dee from radiology called to say that they have STAT orders on patient and need to know is it bilateral or left leg. Also, needs the order to be signed off and sent to them.

## 2016-11-28 NOTE — Progress Notes (Signed)
Called and informed pt's sister, [name]. Routing to Manuela Schwartz to send lab report to PCP, Dr. [name].

## 2016-11-30 DIAGNOSIS — Z79899 Other long term (current) drug therapy: Secondary | ICD-10-CM | POA: Diagnosis not present

## 2016-12-01 ENCOUNTER — Encounter: Payer: Self-pay | Admitting: Internal Medicine

## 2016-12-01 ENCOUNTER — Ambulatory Visit (INDEPENDENT_AMBULATORY_CARE_PROVIDER_SITE_OTHER): Payer: Medicare Other | Admitting: Internal Medicine

## 2016-12-01 ENCOUNTER — Ambulatory Visit: Payer: Medicare Other | Admitting: Internal Medicine

## 2016-12-01 VITALS — BP 106/66 | HR 104 | Ht 66.0 in | Wt 220.0 lb

## 2016-12-01 DIAGNOSIS — I5032 Chronic diastolic (congestive) heart failure: Secondary | ICD-10-CM

## 2016-12-01 DIAGNOSIS — J449 Chronic obstructive pulmonary disease, unspecified: Secondary | ICD-10-CM | POA: Diagnosis not present

## 2016-12-01 LAB — BASIC METABOLIC PANEL
BUN: 10 mg/dL (ref 7–25)
CALCIUM: 9 mg/dL (ref 8.6–10.3)
CHLORIDE: 102 mmol/L (ref 98–110)
CO2: 32 mmol/L — ABNORMAL HIGH (ref 20–31)
CREATININE: 1.13 mg/dL (ref 0.70–1.25)
Glucose, Bld: 97 mg/dL (ref 65–99)
Potassium: 4.3 mmol/L (ref 3.5–5.3)
Sodium: 140 mmol/L (ref 135–146)

## 2016-12-01 NOTE — Patient Instructions (Signed)
Your physician recommends that you schedule a follow-up appointment in: end of march with Arnold Long NP    No change in your medications     Thank you for choosing [facility] !

## 2016-12-01 NOTE — Progress Notes (Signed)
Cardiology Office Note   Date:  12/01/2016   ID:  James Zamora, James Zamora 1956/02/27, MRN [id number]  PCP:  Maggie Font, MD  Cardiologist:   [name], MD   Pt presents for f/u of CHF      History of Present Illness: James Zamora is a 61 y.o. male with a history of SOB, sleep apnea  and abdominal pain Admitted with CHF last year.  Echo with normal LVEF  Rx b blocker and ACE I for BP    I saw him in Aug 2017  Volume up a little at time  Did desat to 34s with walking  He was seen by Arnold Long in October 2017  Admitted to not always using lasix  Also to eating salty foods    Since seen he remains on oxygen  Says he feels OK  Breathng is steady  No CP   Does have LE edema L greater than R   Says he is watching his salt intake    Current Meds  Medication Sig  . aspirin EC 81 MG EC tablet Take 1 tablet (81 mg total) by mouth daily.  . carvedilol (COREG) 6.25 MG tablet Take 1 tablet (6.25 mg total) by mouth 2 (two) times daily with a meal.  . furosemide (LASIX) 40 MG tablet Take 2 Tablets (80 mg)  One Day Alternating Days and Take 3 Tablets (120 mg) on the Other Day  . lisinopril (PRINIVIL,ZESTRIL) 5 MG tablet Take 1 tablet (5 mg total) by mouth daily.  . metolazone (ZAROXOLYN) 2.5 MG tablet Take 1 tablet (2.5 mg total) by mouth daily.  . potassium chloride (K-DUR) 10 MEQ tablet Take 10 meq (1 pill) when you take lasix 60 mg (take KCL every OTHER day)     Allergies:   Nutritional supplements and Penicillins   Past Medical History:  Diagnosis Date  . Asthma   . CHF (congestive heart failure) ([place])   . Perforated bowel [facility])     Past Surgical History:  Procedure Laterality Date  . CHOLECYSTECTOMY    . COLONOSCOPY N/A 07/31/2016   Procedure: COLONOSCOPY;  Surgeon: Danie Binder, MD;  Location[facility];  Service: Endoscopy;  Laterality: N/A;  1:45 pm  . ESOPHAGOGASTRODUODENOSCOPY N/A 07/31/2016   Procedure: ESOPHAGOGASTRODUODENOSCOPY (EGD);  Surgeon: Danie Binder, MD;  Location: [facility];  Service: Endoscopy;  Laterality: N/A;     Social History:  The patient  reports that he quit smoking about 2 years ago. He quit smokeless tobacco use about 2 years ago. His smokeless tobacco use included Chew. He reports that he does not drink alcohol or use drugs.   Family History:  The patient's family history includes Asthma in his father; Diabetes in his mother.    ROS:  Please see the history of present illness. All other systems are reviewed and  Negative to the above problem except as noted.    PHYSICAL EXAM: VS:  BP 106/66   Pulse (!) 104   Ht '5\' 6"'$  (1.676 m)   Wt 220 lb (99.8 kg)   SpO2 94%   BMI 35.51 kg/m   GEN: Well nourished, well developed, in no acute distress  HEENT: normal  Neck: no JVD, carotid bruits, or masses Cardiac: RRR; no murmurs, rubs, or gallops,  Tr RLE   2+ LLE edema    Respiratory:  Wearing O2 [place]  Some decreased airflow on exam   GI: soft, nontender, nondistended, + BS  No hepatomegaly  MS: no deformity Moving all extremities   Skin: warm and dry, no rash Neuro:  Strength and sensation are intact Psych: euthymic mood, full affect   EKG:  EKG is not ordered today.   Lipid Panel No results found for: CHOL, TRIG, HDL, CHOLHDL, VLDL, LDLCALC, LDLDIRECT    Wt Readings from Last 3 Encounters:  12/01/16 220 lb (99.8 kg)  11/27/16 225 lb 9.6 oz (102.3 kg)  10/26/16 224 lb (101.6 kg)      ASSESSMENT AND PLAN:  1  Chronic diaslic CHF Pt still with volume increase with dependent edema   I am not sure how much better will get  Lungs are clear  He is comfortable on oxygen I would check BMET to assess renal funciton.     Current medicines are reviewed at length with the patient today.  The patient does not have concerns regarding medicines.  Signed, [name], MD  12/01/2016 3:11 PM    [facility] [place], [place], [place]  [address] Phone: [phone]; Fax: [phone]

## 2016-12-05 DIAGNOSIS — Z9981 Dependence on supplemental oxygen: Secondary | ICD-10-CM | POA: Diagnosis not present

## 2016-12-05 DIAGNOSIS — I5032 Chronic diastolic (congestive) heart failure: Secondary | ICD-10-CM | POA: Diagnosis not present

## 2016-12-11 ENCOUNTER — Other Ambulatory Visit: Payer: Self-pay

## 2016-12-11 ENCOUNTER — Telehealth: Payer: Self-pay | Admitting: Gastroenterology

## 2016-12-11 DIAGNOSIS — D649 Anemia, unspecified: Secondary | ICD-10-CM

## 2016-12-11 NOTE — Telephone Encounter (Signed)
LMOM for a return all. Lab orders on file for 05/2017.

## 2016-12-11 NOTE — Telephone Encounter (Signed)
Reviewed recent labs. Anemia resolved.  Would recommend recheck CBC, ferritin in six months.

## 2016-12-12 ENCOUNTER — Encounter: Payer: Self-pay | Admitting: Adult Health

## 2016-12-13 NOTE — Telephone Encounter (Signed)
Pt is aware.  

## 2017-01-02 ENCOUNTER — Encounter: Payer: Self-pay | Admitting: Adult Health

## 2017-01-02 ENCOUNTER — Ambulatory Visit (INDEPENDENT_AMBULATORY_CARE_PROVIDER_SITE_OTHER): Payer: Medicare Other | Admitting: Adult Health

## 2017-01-02 VITALS — BP 106/58 | HR 85 | Ht 67.0 in | Wt 227.0 lb

## 2017-01-02 DIAGNOSIS — I10 Essential (primary) hypertension: Secondary | ICD-10-CM | POA: Diagnosis not present

## 2017-01-02 DIAGNOSIS — I5033 Acute on chronic diastolic (congestive) heart failure: Secondary | ICD-10-CM

## 2017-01-02 DIAGNOSIS — Z79899 Other long term (current) drug therapy: Secondary | ICD-10-CM | POA: Diagnosis not present

## 2017-01-02 DIAGNOSIS — J449 Chronic obstructive pulmonary disease, unspecified: Secondary | ICD-10-CM

## 2017-01-02 MED ORDER — FUROSEMIDE 40 MG PO TABS: 120.0000 mg | ORAL_TABLET | Freq: Every day | ORAL | 6 refills | Status: DC

## 2017-01-02 NOTE — Patient Instructions (Signed)
Your physician recommends that you schedule a follow-up appointment in: James Zamora has recommended you make the following change in your medication: Increase Lasix to 120 mg Daily   Your physician recommends that you return for lab work in: 1 week   If you need a refill on your cardiac medications before your next appointment, please call your pharmacy.  Thank you for choosing [facility]!

## 2017-01-02 NOTE — Progress Notes (Signed)
Name: James Zamora    DOB: 1956/10/25  Age: 61 y.o.  MR#: [id number]       PCP:  Maggie Font, MD      Insurance: Payor: MEDICARE / Plan: MEDICARE PART A AND B / Product Type: *No Product type* /   CC:   No chief complaint on file.   VS Vitals:   01/02/17 1409  BP: (!) 106/58  Pulse: 85  SpO2: 95%  Weight: 227 lb (103 kg)    Weights Current Weight  01/02/17 227 lb (103 kg)  12/01/16 220 lb (99.8 kg)  11/27/16 225 lb 9.6 oz (102.3 kg)    Blood Pressure  BP Readings from Last 3 Encounters:  01/02/17 (!) 106/58  12/01/16 106/66  11/27/16 119/72     Admit date:  (Not on file) Last encounter with RMR:  10/26/2016   Allergy Nutritional supplements and Penicillins  Current Outpatient Prescriptions  Medication Sig Dispense Refill  . aspirin EC 81 MG EC tablet Take 1 tablet (81 mg total) by mouth daily. 30 tablet 1  . carvedilol (COREG) 6.25 MG tablet Take 1 tablet (6.25 mg total) by mouth 2 (two) times daily with a meal. 60 tablet 1  . furosemide (LASIX) 40 MG tablet Take 2 Tablets (80 mg)  One Day Alternating Days and Take 3 Tablets (120 mg) on the Other Day 90 tablet 6  . lisinopril (PRINIVIL,ZESTRIL) 5 MG tablet Take 1 tablet (5 mg total) by mouth daily. 30 tablet 1  . potassium chloride (K-DUR) 10 MEQ tablet Take 10 meq (1 pill) when you take lasix 60 mg (take KCL every OTHER day) 45 tablet 3  . metolazone (ZAROXOLYN) 2.5 MG tablet Take 1 tablet (2.5 mg total) by mouth daily. 5 tablet 0   No current facility-administered medications for this visit.     Discontinued Meds:   There are no discontinued medications.  Patient Active Problem List   Diagnosis Date Noted  . Edema of left lower extremity 11/27/2016  . Swelling   . Acute diastolic CHF (congestive heart failure) ([place]) 06/07/2016  . Emesis 06/07/2016  . Generalized abdominal pain 06/07/2016  . Anemia 06/07/2016  . Dehydration 11/13/2015  . AKI (acute kidney injury) ([place]) 11/13/2015  . Wound infection  11/13/2015  . Pulmonary nodule 11/13/2015    LABS    Component Value Date/Time   NA 140 11/30/2016 1448   NA 139 09/15/2016 1518   NA 138 06/26/2016 1450   K 4.3 11/30/2016 1448   K 3.8 09/15/2016 1518   K 4.2 06/26/2016 1450   CL 102 11/30/2016 1448   CL 95 (L) 09/15/2016 1518   CL 98 06/26/2016 1450   CO2 32 (H) 11/30/2016 1448   CO2 34 (H) 09/15/2016 1518   CO2 34 (H) 06/26/2016 1450   GLUCOSE 97 11/30/2016 1448   GLUCOSE 97 09/15/2016 1518   GLUCOSE 85 06/26/2016 1450   BUN 10 11/30/2016 1448   BUN 15 09/15/2016 1518   BUN 12 06/26/2016 1450   CREATININE 1.13 11/30/2016 1448   CREATININE 1.48 (H) 09/15/2016 1518   CREATININE 1.24 06/26/2016 1450   CALCIUM 9.0 11/30/2016 1448   CALCIUM 9.5 09/15/2016 1518   CALCIUM 9.0 06/26/2016 1450   GFRNONAA >60 06/11/2016 0554   GFRNONAA 53 (L) 06/10/2016 0543   GFRNONAA 53 (L) 06/09/2016 0558   GFRAA >60 06/11/2016 0554   GFRAA >60 06/10/2016 0543   GFRAA >60 06/09/2016 0558   CMP     Component  Value Date/Time   NA 140 11/30/2016 1448   K 4.3 11/30/2016 1448   CL 102 11/30/2016 1448   CO2 32 (H) 11/30/2016 1448   GLUCOSE 97 11/30/2016 1448   BUN 10 11/30/2016 1448   CREATININE 1.13 11/30/2016 1448   CALCIUM 9.0 11/30/2016 1448   PROT 7.8 06/26/2016 1450   ALBUMIN 3.6 06/26/2016 1450   AST 14 06/26/2016 1450   ALT 9 06/26/2016 1450   ALKPHOS 72 06/26/2016 1450   BILITOT 0.3 06/26/2016 1450   GFRNONAA >60 06/11/2016 0554   GFRAA >60 06/11/2016 0554       Component Value Date/Time   WBC 5.7 11/27/2016 1352   WBC 4.8 06/26/2016 1450   WBC 4.8 06/11/2016 0554   HGB 13.4 11/27/2016 1352   HGB 11.3 (L) 06/26/2016 1450   HGB 11.1 (L) 06/11/2016 0554   HCT 43.4 11/27/2016 1352   HCT 39.0 06/26/2016 1450   HCT 38.8 (L) 06/11/2016 0554   MCV 80.8 11/27/2016 1352   MCV 68.8 (L) 06/26/2016 1450   MCV 68.2 (L) 06/11/2016 0554    Lipid Panel  No results found for: CHOL, TRIG, HDL, CHOLHDL, VLDL, LDLCALC,  LDLDIRECT  ABG    Component Value Date/Time   TCO2 26 11/13/2015 0207     Lab Results  Component Value Date   TSH 0.58 06/26/2016   BNP (last 3 results)  Recent Labs  06/07/16 0415 06/26/16 1450  BNP 14.0 5.6    ProBNP (last 3 results) No results for input(s): PROBNP in the last 8760 hours.  Cardiac Panel (last 3 results) No results for input(s): CKTOTAL, CKMB, TROPONINI, RELINDX in the last 72 hours.  Iron/TIBC/Ferritin/ %Sat    Component Value Date/Time   IRON 34 (L) 07/06/2016 1133   TIBC 454 (H) 06/07/2016 1140   FERRITIN 97 11/27/2016 1352   IRONPCTSAT 4 (L) 06/07/2016 1140     EKG Orders placed or performed during the hospital encounter of 06/07/16  . ED EKG  . ED EKG  . EKG 12-Lead  . EKG 12-Lead  . EKG     Prior Assessment and Plan Problem List as of 01/02/2017 Reviewed: 11/27/2016 10:51 AM by Neil Crouch, PA-C     Cardiovascular and Mediastinum   Acute diastolic CHF (congestive heart failure) ([place])     Genitourinary   AKI (acute kidney injury) ([place])     Other   Dehydration   Wound infection   Pulmonary nodule   Emesis   Generalized abdominal pain   Anemia   Last Assessment & Plan 11/27/2016 Office Visit Written 11/27/2016 11:10 AM by Mahala Menghini, PA-C    Microcytic anemia with low iron. EGD with gastritis. TCS with two tubular adenomas removed. No overt GI bleeding and no GI symptoms. Patient to have labs done. If persistent IDA, consider small bowel capsule study to complete work up.       Swelling   Edema of left lower extremity   Last Assessment & Plan 11/27/2016 Office Visit Written 11/27/2016 11:09 AM by Mahala Menghini, PA-C    H/o chronic lower extremity edema. Patient complains of worsening edema of the left lower leg. Wound has healed from before. No pain. Check venous u/s to rule out DVT today.           Imaging: No results found.

## 2017-01-02 NOTE — Progress Notes (Signed)
Cardiology Office Note   Date:  01/02/2017   ID:  James, Zamora [birth year], MRN [id number]  PCP:  Maggie Font, MD  Cardiologist: Ross/  Jory Sims, NP   Chief Complaint  Patient presents with  . Congestive Heart Failure      History of Present Illness: James Zamora is a 61 y.o. male who presents for ongoing assessment and management of chronic diastolic CHF, with James history to include asthma. The patient was last seen in the office by Dr. Harrington Challenger on 12/01/2016. The patient is oxygen dependent. On last office visit the patient still had evidence of volume overload, no changes were made in his medication regimen. Follow-up BMET was ordered.  Labs on 11/30/2016: Sodium 140, potassium 4.3, chloride 102, CO2 32, glucose 92, BUN 10, creatinine 1.13.  Echocardiogram 06/08/2016 Left ventricle: The cavity size was normal. Wall thickness was   increased in a pattern of moderate LVH. Systolic function was   normal. The estimated ejection fraction was in the range of 60%   to 65%. Wall motion was normal; there were no regional wall   motion abnormalities. Left ventricular diastolic function   parameters were normal. - Aortic valve: Valve area (VTI): 2.72 cm^2. Valve area (Vmax):   2.72 cm^2. Valve area (Vmean): 2.71 cm^2. - Right ventricle: The cavity size was mildly dilated. - Atrial septum: No defect or patent foramen ovale was identified. - Technically adequate study.  He is here today with a 7 pound weight gain. He is not restricting his salt. He has not taken his diuretic today. The patient remains on oxygen 2 L.  Past Medical History:  Diagnosis Date  . Asthma   . CHF (congestive heart failure) ([place])   . Perforated bowel [facility])     Past Surgical History:  Procedure Laterality Date  . CHOLECYSTECTOMY    . COLONOSCOPY N/A 07/31/2016   Procedure: COLONOSCOPY;  Surgeon: Danie Binder, MD;  Location[facility];  Service: Endoscopy;  Laterality: N/A;  1:45 pm   . ESOPHAGOGASTRODUODENOSCOPY N/A 07/31/2016   Procedure: ESOPHAGOGASTRODUODENOSCOPY (EGD);  Surgeon: Danie Binder, MD;  Location: [facility];  Service: Endoscopy;  Laterality: N/A;     Current Outpatient Prescriptions  Medication Sig Dispense Refill  . aspirin EC 81 MG EC tablet Take 1 tablet (81 mg total) by mouth daily. 30 tablet 1  . carvedilol (COREG) 6.25 MG tablet Take 1 tablet (6.25 mg total) by mouth 2 (two) times daily with a meal. 60 tablet 1  . lisinopril (PRINIVIL,ZESTRIL) 5 MG tablet Take 1 tablet (5 mg total) by mouth daily. 30 tablet 1  . potassium chloride (K-DUR) 10 MEQ tablet Take 10 meq (1 pill) when you take lasix 60 mg (take KCL every James day) 45 tablet 3  . furosemide (LASIX) 40 MG tablet Take 3 tablets (120 mg total) by mouth daily. 90 tablet 6  . metolazone (ZAROXOLYN) 2.5 MG tablet Take 1 tablet (2.5 mg total) by mouth daily. 5 tablet 0   No current facility-administered medications for this visit.     Allergies:   Nutritional supplements and Penicillins    Social History:  The patient  reports that he quit smoking about 2 years ago. He quit smokeless tobacco use about 2 years ago. His smokeless tobacco use included Chew. He reports that he does not drink alcohol or use drugs.   Family History:  The patient's family history includes Asthma in his father; Diabetes in his mother.  ROS: All James systems are reviewed and negative. Unless otherwise mentioned in H&P    PHYSICAL EXAM: VS:  BP (!) 106/58   Pulse 85   Ht '5\' 7"'$  (1.702 m)   Wt 227 lb (103 kg)   SpO2 95%   BMI 35.55 kg/m  , BMI Body mass index is 35.55 kg/m. GEN: Well nourished, well developed, in no acute distress  HEENT: normal  Neck: no JVD, carotid bruits, or masses Cardiac: RRR; no murmurs, rubs, or gallops Respiratory:  clear to auscultation bilaterally, normal work of breathing, wearing oxygen 2 L via nasal cannula. GI: soft, nontender, nondistended, + BS MS: no deformity or  atrophy bilateral Woody edema, left greater than right. Skin: warm and dry, no rash Neuro:  Strength and sensation are intact Psych: euthymic mood, full affect   Recent Labs: 06/26/2016: ALT 9; Brain Natriuretic Peptide 5.6; TSH 0.58 11/27/2016: Hemoglobin 13.4; Platelets 308 11/30/2016: BUN 10; Creat 1.13; Potassium 4.3; Sodium 140    Lipid Panel No results found for: CHOL, TRIG, HDL, CHOLHDL, VLDL, LDLCALC, LDLDIRECT    Wt Readings from Last 3 Encounters:  01/02/17 227 lb (103 kg)  12/01/16 220 lb (99.8 kg)  11/27/16 225 lb 9.6 oz (102.3 kg)          ASSESSMENT AND PLAN:  1.  Chronic diastolic heart failure: The patient is not compliant with low sodium diet. He states he is taking his diuretics as directed, but he has had a 7 pound weight gain. We'll increase Lasix to 120 mg daily instead of alternating doses. I vascular weigh himself daily and bring his weights with him to next appointment. He will continue lisinopril, potassium, metolazone 2.5 mg daily. Follow-up BMET in one week.  2. Oxygen-dependent COPD: Wearing oxygen 2 L. No evidence of wheezing.  3. Hypertension: Blood pressure is low normal.   Current medicines are reviewed at length with the patient today.    Labs/ tests ordered today include: BMET  Orders Placed This Encounter  Procedures  . Basic Metabolic Panel (BMET)     Disposition:   FU with One month. Signed, Jory Sims, NP  01/02/2017 5:05 PM    [facility] [address] [place], [place] [address] Phone: [phone]; Fax: [phone]

## 2017-01-04 ENCOUNTER — Ambulatory Visit: Payer: Medicare Other | Admitting: Adult Health

## 2017-01-10 DIAGNOSIS — Z79899 Other long term (current) drug therapy: Secondary | ICD-10-CM | POA: Diagnosis not present

## 2017-01-11 LAB — BASIC METABOLIC PANEL
BUN: 13 mg/dL (ref 7–25)
CO2: 34 mmol/L — AB (ref 20–31)
Calcium: 9.3 mg/dL (ref 8.6–10.3)
Chloride: 99 mmol/L (ref 98–110)
Creat: 1.21 mg/dL (ref 0.70–1.25)
GLUCOSE: 111 mg/dL — AB (ref 65–99)
POTASSIUM: 3.9 mmol/L (ref 3.5–5.3)
SODIUM: 140 mmol/L (ref 135–146)

## 2017-02-01 NOTE — Progress Notes (Signed)
Cardiology Office Note   Date:  02/02/2017   ID:  James Zamora Dec 20, 1955, MRN [id number]  PCP:  Maggie Font, MD  Cardiologist:  Ross/ Jory Sims, NP   Chief Complaint  Patient presents with  . Follow-up    CHF      History of Present Illness: James Zamora is a 61 y.o. male who presents for  ongoing assessment and management of chronic diastolic heart failure, with other history to include asthma. The patient was last seen in the office on 01/02/2017 at which time he still had evidence of fluid overload with an additional 7 pound weight gain. He was not restricting salt. The patient had increased on Lasix dose to 120 mg daily which was a change from every other day dosing. He was asked to weigh himself daily and bring his weight to his next appointment. A follow-up BMET was ordered.  Labs on 01/10/2017: Sodium 140 potassium 3.9 chloride 99 CO2 34 glucose 111 BUN 13 creatinine 1.21.  He has been compliant with Lasix regimen, has lost an additional 5 pounds since being seen last, has no complaints of palpitations and dizziness, or chest pain. He has been having some occasional leg cramps. He states he takes mustard or vinegar to help. I have gone over his medications and he is not certain that he is taking lisinopril or potassium. He states that his sister takes care of his medications.  Past Medical History:  Diagnosis Date  . Asthma   . CHF (congestive heart failure) ([place])   . Perforated bowel [facility])     Past Surgical History:  Procedure Laterality Date  . CHOLECYSTECTOMY    . COLONOSCOPY N/A 07/31/2016   Procedure: COLONOSCOPY;  Surgeon: Danie Binder, MD;  [facility];  Service: Endoscopy;  Laterality: N/A;  1:45 pm  . ESOPHAGOGASTRODUODENOSCOPY N/A 07/31/2016   Procedure: ESOPHAGOGASTRODUODENOSCOPY (EGD);  Surgeon: Danie Binder, MD;  Location[facility];  Service: Endoscopy;  Laterality: N/A;     Current Outpatient Prescriptions   Medication Sig Dispense Refill  . aspirin EC 81 MG EC tablet Take 1 tablet (81 mg total) by mouth daily. 30 tablet 1  . carvedilol (COREG) 6.25 MG tablet Take 1 tablet (6.25 mg total) by mouth 2 (two) times daily with a meal. 60 tablet 1  . furosemide (LASIX) 40 MG tablet Take 3 tablets (120 mg total) by mouth daily. 90 tablet 6  . lisinopril (PRINIVIL,ZESTRIL) 5 MG tablet Take 1 tablet (5 mg total) by mouth daily. 30 tablet 1  . potassium chloride (K-DUR) 10 MEQ tablet Take 10 meq (1 pill) when you take lasix 60 mg (take KCL every OTHER day) 45 tablet 3  . metolazone (ZAROXOLYN) 2.5 MG tablet Take 1 tablet (2.5 mg total) by mouth daily. 5 tablet 0   No current facility-administered medications for this visit.     Allergies:   Nutritional supplements and Penicillins    Social History:  The patient  reports that he quit smoking about 2 years ago. He quit smokeless tobacco use about 2 years ago. His smokeless tobacco use included Chew. He reports that he does not drink alcohol or use drugs.   Family History:  The patient's family history includes Asthma in his father; Diabetes in his mother.    ROS: All other systems are reviewed and negative. Unless otherwise mentioned in H&P    PHYSICAL EXAM: VS:  BP 120/75   Pulse 92   Ht 5'  3" (1.6 m)   Wt 223 lb (101.2 kg)   SpO2 95%   BMI 39.50 kg/m  , BMI Body mass index is 39.5 kg/m. GEN: Well nourished, well developed, in no acute distress  HEENT: normal  Neck: no JVD, carotid bruits, or masses Cardiac: RRR; no murmurs, rubs, or gallops,no edema  Respiratory: Crackles in the bases, no wheezes or rhonchi, no cough, wearing oxygen via nasal cannula 2 L. GI: soft, nontender, nondistended, + BS MS: no deformity or atrophy Lower extremity woody thick skindermatitis and skin scaling noted. Skin: warm and dry, no rash Neuro:  Strength and sensation are intact Psych: euthymic mood, full affect    Recent Labs: 06/26/2016: ALT 9; Brain  Natriuretic Peptide 5.6; TSH 0.58 11/27/2016: Hemoglobin 13.4; Platelets 308 01/10/2017: BUN 13; Creat 1.21; Potassium 3.9; Sodium 140    Lipid Panel No results found for: CHOL, TRIG, HDL, CHOLHDL, VLDL, LDLCALC, LDLDIRECT    Wt Readings from Last 3 Encounters:  02/02/17 223 lb (101.2 kg)  01/02/17 227 lb (103 kg)  12/01/16 220 lb (99.8 kg)      Other studies Reviewed: Echocardiogram 2016-06-09 Left ventricle: The cavity size was normal. Wall thickness was increased in a pattern of moderate LVH. Systolic function was normal. The estimated ejection fraction was in the range of 60% to 65%. Wall motion was normal; there were no regional wall motion abnormalities. Left ventricular diastolic function parameters were normal. - Aortic valve: Valve area (VTI): 2.72 cm^2. Valve area (Vmax): 2.72 cm^2. Valve area (Vmean): 2.71 cm^2. - Right ventricle: The cavity size was mildly dilated. - Atrial septum: No defect or patent foramen ovale was identified. - Technically adequate study.  ASSESSMENT AND PLAN:  1. Chronic diastolic heart failure: The patient is doing much better than last office visit. He appears to be very compensated. He is not aware what medications he is taking, as his sister gives him to him daily. He states he is only taking 3 tablets a day, and is uncertain which medicines they are. I have asked him to take his ABS medication list and give it to his sister to compare to his medications at home which she is providing for him. Refills are given for all medications to be sure that he has what he needs.  2. Lower extremity edema: Much improved. No changes in medication regimen.   Current medicines are reviewed at length with the patient today.    Disposition:   FU with 3 months  Signed, Jory Sims, NP  02/02/2017 1:34 PM    [facility] [address], [place], [place] [address] Phone: [phone]; Fax: [phone]

## 2017-02-02 ENCOUNTER — Ambulatory Visit (INDEPENDENT_AMBULATORY_CARE_PROVIDER_SITE_OTHER): Payer: Medicare Other | Admitting: Adult Health

## 2017-02-02 ENCOUNTER — Encounter: Payer: Self-pay | Admitting: Adult Health

## 2017-02-02 VITALS — BP 120/75 | HR 92 | Ht 63.0 in | Wt 223.0 lb

## 2017-02-02 DIAGNOSIS — I5033 Acute on chronic diastolic (congestive) heart failure: Secondary | ICD-10-CM | POA: Diagnosis not present

## 2017-02-02 DIAGNOSIS — I10 Essential (primary) hypertension: Secondary | ICD-10-CM

## 2017-02-02 NOTE — Progress Notes (Signed)
Name: James Zamora    DOB: [birth year]  Age: 61 y.o.  MR#: [id number]       PCP:  Maggie Font, MD      Insurance: Payor: MEDICARE / Plan: MEDICARE PART A AND B / Product Type: *No Product type* /   CC:   No chief complaint on file.   VS Vitals:   02/02/17 1320  BP: 120/75  Pulse: 98  Weight: 223 lb (101.2 kg)  Height: '5\' 3"'$  (1.6 m)    Weights Current Weight  02/02/17 223 lb (101.2 kg)  01/02/17 227 lb (103 kg)  12/01/16 220 lb (99.8 kg)    Blood Pressure  BP Readings from Last 3 Encounters:  02/02/17 120/75  01/02/17 (!) 106/58  12/01/16 106/66     Admit date:  (Not on file) Last encounter with RMR:  01/02/2017   Allergy Nutritional supplements and Penicillins  Current Outpatient Prescriptions  Medication Sig Dispense Refill  . aspirin EC 81 MG EC tablet Take 1 tablet (81 mg total) by mouth daily. 30 tablet 1  . carvedilol (COREG) 6.25 MG tablet Take 1 tablet (6.25 mg total) by mouth 2 (two) times daily with a meal. 60 tablet 1  . furosemide (LASIX) 40 MG tablet Take 3 tablets (120 mg total) by mouth daily. 90 tablet 6  . lisinopril (PRINIVIL,ZESTRIL) 5 MG tablet Take 1 tablet (5 mg total) by mouth daily. 30 tablet 1  . potassium chloride (K-DUR) 10 MEQ tablet Take 10 meq (1 pill) when you take lasix 60 mg (take KCL every OTHER day) 45 tablet 3  . metolazone (ZAROXOLYN) 2.5 MG tablet Take 1 tablet (2.5 mg total) by mouth daily. 5 tablet 0   No current facility-administered medications for this visit.     Discontinued Meds:   There are no discontinued medications.  Patient Active Problem List   Diagnosis Date Noted  . Edema of left lower extremity 11/27/2016  . Swelling   . Acute diastolic CHF (congestive heart failure) ([place]) 06/07/2016  . Emesis 06/07/2016  . Generalized abdominal pain 06/07/2016  . Anemia 06/07/2016  . Dehydration 11/13/2015  . AKI (acute kidney injury) ([place]) 11/13/2015  . Wound infection 11/13/2015  . Pulmonary nodule 11/13/2015     LABS    Component Value Date/Time   NA 140 01/10/2017 1545   NA 140 11/30/2016 1448   NA 139 09/15/2016 1518   K 3.9 01/10/2017 1545   K 4.3 11/30/2016 1448   K 3.8 09/15/2016 1518   CL 99 01/10/2017 1545   CL 102 11/30/2016 1448   CL 95 (L) 09/15/2016 1518   CO2 34 (H) 01/10/2017 1545   CO2 32 (H) 11/30/2016 1448   CO2 34 (H) 09/15/2016 1518   GLUCOSE 111 (H) 01/10/2017 1545   GLUCOSE 97 11/30/2016 1448   GLUCOSE 97 09/15/2016 1518   BUN 13 01/10/2017 1545   BUN 10 11/30/2016 1448   BUN 15 09/15/2016 1518   CREATININE 1.21 01/10/2017 1545   CREATININE 1.13 11/30/2016 1448   CREATININE 1.48 (H) 09/15/2016 1518   CALCIUM 9.3 01/10/2017 1545   CALCIUM 9.0 11/30/2016 1448   CALCIUM 9.5 09/15/2016 1518   GFRNONAA >60 06/11/2016 0554   GFRNONAA 53 (L) 06/10/2016 0543   GFRNONAA 53 (L) 06/09/2016 0558   GFRAA >60 06/11/2016 0554   GFRAA >60 06/10/2016 0543   GFRAA >60 06/09/2016 0558   CMP     Component Value Date/Time   NA 140 01/10/2017 1545  K 3.9 01/10/2017 1545   CL 99 01/10/2017 1545   CO2 34 (H) 01/10/2017 1545   GLUCOSE 111 (H) 01/10/2017 1545   BUN 13 01/10/2017 1545   CREATININE 1.21 01/10/2017 1545   CALCIUM 9.3 01/10/2017 1545   PROT 7.8 06/26/2016 1450   ALBUMIN 3.6 06/26/2016 1450   AST 14 06/26/2016 1450   ALT 9 06/26/2016 1450   ALKPHOS 72 06/26/2016 1450   BILITOT 0.3 06/26/2016 1450   GFRNONAA >60 06/11/2016 0554   GFRAA >60 06/11/2016 0554       Component Value Date/Time   WBC 5.7 11/27/2016 1352   WBC 4.8 06/26/2016 1450   WBC 4.8 06/11/2016 0554   HGB 13.4 11/27/2016 1352   HGB 11.3 (L) 06/26/2016 1450   HGB 11.1 (L) 06/11/2016 0554   HCT 43.4 11/27/2016 1352   HCT 39.0 06/26/2016 1450   HCT 38.8 (L) 06/11/2016 0554   MCV 80.8 11/27/2016 1352   MCV 68.8 (L) 06/26/2016 1450   MCV 68.2 (L) 06/11/2016 0554    Lipid Panel  No results found for: CHOL, TRIG, HDL, CHOLHDL, VLDL, LDLCALC, LDLDIRECT  ABG    Component Value  Date/Time   TCO2 26 11/13/2015 0207     Lab Results  Component Value Date   TSH 0.58 06/26/2016   BNP (last 3 results)  Recent Labs  06/07/16 0415 06/26/16 1450  BNP 14.0 5.6    ProBNP (last 3 results) No results for input(s): PROBNP in the last 8760 hours.  Cardiac Panel (last 3 results) No results for input(s): CKTOTAL, CKMB, TROPONINI, RELINDX in the last 72 hours.  Iron/TIBC/Ferritin/ %Sat    Component Value Date/Time   IRON 34 (L) 07/06/2016 1133   TIBC 454 (H) 06/07/2016 1140   FERRITIN 97 11/27/2016 1352   IRONPCTSAT 4 (L) 06/07/2016 1140     EKG Orders placed or performed during the hospital encounter of 06/07/16  . ED EKG  . ED EKG  . EKG 12-Lead  . EKG 12-Lead  . EKG     Prior Assessment and Plan Problem List as of 02/02/2017 Reviewed: 01/02/2017  5:06 PM by Jory Sims, NP     Cardiovascular and Mediastinum   Acute diastolic CHF (congestive heart failure) ([place])     Genitourinary   AKI (acute kidney injury) ([place])     Other   Dehydration   Wound infection   Pulmonary nodule   Emesis   Generalized abdominal pain   Anemia   Last Assessment & Plan 11/27/2016 Office Visit Written 11/27/2016 11:10 AM by Mahala Menghini, PA-C    Microcytic anemia with low iron. EGD with gastritis. TCS with two tubular adenomas removed. No overt GI bleeding and no GI symptoms. Patient to have labs done. If persistent IDA, consider small bowel capsule study to complete work up.       Swelling   Edema of left lower extremity   Last Assessment & Plan 11/27/2016 Office Visit Written 11/27/2016 11:09 AM by Mahala Menghini, PA-C    H/o chronic lower extremity edema. Patient complains of worsening edema of the left lower leg. Wound has healed from before. No pain. Check venous u/s to rule out DVT today.           Imaging: No results found.

## 2017-02-02 NOTE — Patient Instructions (Signed)
Medication Instructions:  Your physician recommends that you continue on your current medications as directed. Please refer to the Current Medication list given to you today.   Labwork: NONE  Testing/Procedures: NONE  Follow-Up: Your physician recommends that you schedule a follow-up appointment in: 3 MONTHS    Any Other Special Instructions Will Be Listed Below (If Applicable).     If you need a refill on your cardiac medications before your next appointment, please call your pharmacy.   

## 2017-02-05 ENCOUNTER — Other Ambulatory Visit: Payer: Self-pay | Admitting: Adult Health

## 2017-02-06 DIAGNOSIS — R0902 Hypoxemia: Secondary | ICD-10-CM | POA: Diagnosis not present

## 2017-02-06 DIAGNOSIS — I5032 Chronic diastolic (congestive) heart failure: Secondary | ICD-10-CM | POA: Diagnosis not present

## 2017-02-06 DIAGNOSIS — Z6838 Body mass index (BMI) 38.0-38.9, adult: Secondary | ICD-10-CM | POA: Diagnosis not present

## 2017-02-06 DIAGNOSIS — Z9981 Dependence on supplemental oxygen: Secondary | ICD-10-CM | POA: Diagnosis not present

## 2017-02-20 ENCOUNTER — Emergency Department (HOSPITAL_COMMUNITY): Payer: Medicare Other

## 2017-02-20 ENCOUNTER — Emergency Department (HOSPITAL_COMMUNITY)
Admission: EM | Admit: 2017-02-20 | Discharge: 2017-02-21 | Disposition: A | Discharge status: Home / Self Care | Payer: Medicare Other | Attending: Emergency Medicine | Admitting: Emergency Medicine

## 2017-02-20 ENCOUNTER — Encounter (HOSPITAL_COMMUNITY): Payer: Self-pay | Admitting: Emergency Medicine

## 2017-02-20 DIAGNOSIS — R1013 Epigastric pain: Secondary | ICD-10-CM | POA: Diagnosis not present

## 2017-02-20 DIAGNOSIS — R918 Other nonspecific abnormal finding of lung field: Secondary | ICD-10-CM | POA: Diagnosis not present

## 2017-02-20 DIAGNOSIS — Z7982 Long term (current) use of aspirin: Secondary | ICD-10-CM | POA: Insufficient documentation

## 2017-02-20 DIAGNOSIS — I5021 Acute systolic (congestive) heart failure: Secondary | ICD-10-CM | POA: Insufficient documentation

## 2017-02-20 DIAGNOSIS — R1110 Vomiting, unspecified: Secondary | ICD-10-CM

## 2017-02-20 DIAGNOSIS — R112 Nausea with vomiting, unspecified: Secondary | ICD-10-CM | POA: Insufficient documentation

## 2017-02-20 DIAGNOSIS — J45909 Unspecified asthma, uncomplicated: Secondary | ICD-10-CM | POA: Insufficient documentation

## 2017-02-20 DIAGNOSIS — R109 Unspecified abdominal pain: Secondary | ICD-10-CM | POA: Diagnosis not present

## 2017-02-20 DIAGNOSIS — E041 Nontoxic single thyroid nodule: Secondary | ICD-10-CM | POA: Diagnosis not present

## 2017-02-20 DIAGNOSIS — R1084 Generalized abdominal pain: Secondary | ICD-10-CM | POA: Diagnosis present

## 2017-02-20 DIAGNOSIS — Z87891 Personal history of nicotine dependence: Secondary | ICD-10-CM | POA: Insufficient documentation

## 2017-02-20 DIAGNOSIS — Z79899 Other long term (current) drug therapy: Secondary | ICD-10-CM | POA: Insufficient documentation

## 2017-02-20 LAB — CBC
HCT: 43.6 % (ref 39.0–52.0)
HEMOGLOBIN: 14.2 g/dL (ref 13.0–17.0)
MCH: 26.6 pg (ref 26.0–34.0)
MCHC: 32.6 g/dL (ref 30.0–36.0)
MCV: 81.8 fL (ref 78.0–100.0)
PLATELETS: 277 10*3/uL (ref 150–400)
RBC: 5.33 MIL/uL (ref 4.22–5.81)
RDW: 15.4 % (ref 11.5–15.5)
WBC: 8.5 10*3/uL (ref 4.0–10.5)

## 2017-02-20 MED ORDER — ONDANSETRON HCL 4 MG/2ML IJ SOLN
4.0000 mg | Freq: Once | INTRAMUSCULAR | Status: AC
  Administered 2017-02-20: 4 mg via INTRAVENOUS
  Filled 2017-02-20: qty 2

## 2017-02-20 MED ORDER — SODIUM CHLORIDE 0.9 % IV BOLUS (SEPSIS)
1000.0000 mL | Freq: Once | INTRAVENOUS | Status: AC
  Administered 2017-02-20: 1000 mL via INTRAVENOUS

## 2017-02-20 MED ORDER — IOPAMIDOL (ISOVUE-300) INJECTION 61%
INTRAVENOUS | Status: AC
  Administered 2017-02-21: 30 mL
  Filled 2017-02-20: qty 30

## 2017-02-20 MED ORDER — HYDROMORPHONE HCL 1 MG/ML IJ SOLN
1.0000 mg | Freq: Once | INTRAMUSCULAR | Status: AC
  Administered 2017-02-20: 1 mg via INTRAVENOUS
  Filled 2017-02-20: qty 1

## 2017-02-20 NOTE — ED Triage Notes (Signed)
Pt c/o generalized abd pain and vomiting that started today.

## 2017-02-20 NOTE — ED Provider Notes (Signed)
[facility] DEPT Provider Note   CSN: [id number] Arrival date & time: 02/20/17  2150   By signing my name below, I, James Zamora, attest that this documentation has been prepared under the direction and in the presence of [name], MD. Electronically Signed: Hilbert Zamora, Scribe. 02/20/17. 11:49 PM. History   Chief Complaint Chief Complaint  Patient presents with  . Emesis    The history is provided by the patient. No language interpreter was used.  HPI Comments: James Zamora is a 61 y.o. male who presents to the Emergency Department complaining of generalized abdominal pain with associated vomiting that began earlier today. He states that he was feeling fine prior to today. The patient states that he is in "severe" pain currently. He has been vomiting for the entire day. He states that he had an umbilical ventral hernia repair a few years ago. He has also had a cholecystectomy. He denies drinking EtOH. He denies hx of ulcers. He also denies diarrhea, blood in vomit, or difficulty urinating currently.   Past Medical History:  Diagnosis Date  . Asthma   . CHF (congestive heart failure) ([place])   . Perforated bowel [facility])     Patient Active Problem List   Diagnosis Date Noted  . Edema of left lower extremity 11/27/2016  . Swelling   . Acute diastolic CHF (congestive heart failure) ([place]) 06/07/2016  . Emesis 06/07/2016  . Generalized abdominal pain 06/07/2016  . Anemia 06/07/2016  . Dehydration 11/13/2015  . AKI (acute kidney injury) ([place]) 11/13/2015  . Wound infection 11/13/2015  . Pulmonary nodule 11/13/2015    Past Surgical History:  Procedure Laterality Date  . CHOLECYSTECTOMY    . COLONOSCOPY N/A 07/31/2016   Procedure: COLONOSCOPY;  Surgeon: Danie Binder, MD;  Location: [facility] ENDO SUITE;  Service: Endoscopy;  Laterality: N/A;  1:45 pm  . ESOPHAGOGASTRODUODENOSCOPY N/A 07/31/2016   Procedure: ESOPHAGOGASTRODUODENOSCOPY (EGD);  Surgeon: Danie Binder,  MD;  Location: [facility] ENDO SUITE;  Service: Endoscopy;  Laterality: N/A;       Home Medications    Prior to Admission medications   Medication Sig Start Date End Date Taking? Authorizing Provider  aspirin EC 81 MG EC tablet Take 1 tablet (81 mg total) by mouth daily. 06/12/16  Yes Kathie Dike, MD  carvedilol (COREG) 6.25 MG tablet Take 1 tablet (6.25 mg total) by mouth 2 (two) times daily with a meal. 06/12/16  Yes Kathie Dike, MD  furosemide (LASIX) 40 MG tablet Take 3 tablets (120 mg total) by mouth daily. 01/02/17 04/02/17 Yes Lendon Colonel, NP  lisinopril (PRINIVIL,ZESTRIL) 5 MG tablet Take 1 tablet (5 mg total) by mouth daily. 06/12/16  Yes Kathie Dike, MD  metolazone (ZAROXOLYN) 2.5 MG tablet TAKE ONE TABLET BY MOUTH ONCE DAILY 02/05/17  Yes Fay Records, MD  potassium chloride (K-DUR) 10 MEQ tablet Take 10 meq (1 pill) when you take lasix 60 mg (take KCL every OTHER day) 06/27/16  Yes Fay Records, MD    Family History Family History  Problem Relation Age of Onset  . Diabetes Mother   . Asthma Father   . Colon cancer Neg Hx     Social History Social History  Substance Use Topics  . Smoking status: Former Smoker    Quit date: 07/06/2014  . Smokeless tobacco: Former Systems developer    Types: Chew    Quit date: 07/06/2014  . Alcohol use No     Allergies   Nutritional supplements and Penicillins  Review of Systems Review of Systems A complete 10 system review of systems was obtained and all systems are negative except as noted in the HPI and PMH.   Physical Exam Updated Vital Signs BP 122/72 (BP Location: Right Arm)   Pulse (!) 106   Temp 98.9 F (37.2 C) (Temporal)   Resp (!) 23   Ht '5\' 8"'$  (1.727 m)   Wt 220 lb (99.8 kg)   SpO2 91%   BMI 33.45 kg/m   Physical Exam  Constitutional: He is oriented to person, place, and time. He appears well-developed and well-nourished.  HENT:  Head: Normocephalic and atraumatic.  Eyes: EOM are normal.  Neck: Normal range of  motion.  Cardiovascular: Normal rate, regular rhythm, normal heart sounds and intact distal pulses.   Pulmonary/Chest: Effort normal and breath sounds normal. No respiratory distress.  Abdominal: Soft. He exhibits no distension. There is tenderness.  Epigastric tenderness.  Musculoskeletal: Normal range of motion.  Neurological: He is alert and oriented to person, place, and time.  Skin: Skin is warm and dry.  Psychiatric: He has a normal mood and affect. Judgment normal.  Nursing note and vitals reviewed.    ED Treatments / Results  DIAGNOSTIC STUDIES: Oxygen Saturation is 91% on RA, low by my interpretation.    COORDINATION OF CARE: 11:11 PM Discussed treatment plan with pt at bedside and pt agreed to plan. I will check the patient's CT scan.  Labs (all labs ordered are listed, but only abnormal results are displayed) Labs Reviewed  COMPREHENSIVE METABOLIC PANEL - Abnormal; Notable for the following:       Result Value   Chloride 94 (*)    Glucose, Bld 141 (*)    BUN 39 (*)    Creatinine, Ser 1.57 (*)    Total Protein 8.7 (*)    ALT 12 (*)    GFR calc non Af Amer 46 (*)    GFR calc Af Amer 54 (*)    All other components within normal limits  CBC  LIPASE, BLOOD    EKG  EKG Interpretation None       Radiology Ct Chest Wo Contrast  Result Date: 02/21/2017 CLINICAL DATA:  Pulmonary mass seen on CT abdomen study EXAM: CT CHEST WITHOUT CONTRAST TECHNIQUE: Multidetector CT imaging of the chest was performed following the standard protocol without IV contrast. COMPARISON:  Chest CT 12/31/2009, CXR exams dating back through 11/13/2015 FINDINGS: Cardiovascular: Visualized cardiac chambers are normal in size. Trace pericardial effusion versus pleural thickening anteriorly. Tiny nodular epicardial nodular densities are noted similar to 2011 and may reflect small lymph nodes. Minimal coronary arteriosclerosis along the LAD. Dilated main pulmonary artery to 4.1 cm suggesting a  component of pulmonary hypertension. No aortic aneurysm. There is aortic atherosclerosis. Normal branch pattern of the great vessels. Mediastinum/Nodes: Hypodense nodules of the left thyroid gland are noted measuring up 4 and 15 mm. These can be correlated with ultrasound. The trachea and mainstem bronchi appear patent. 13 mm short axis left lower paratracheal lymph node is noted previously 10 mm. Lungs/Pleura: Within the superior segment of the left lower lobe is a gait 3.7 x 3.6 x 3.9 cm pulmonary mass concerning for primary bronchogenic carcinoma, not apparent on prior chest CT from 2011. Minimal subsegmental atelectasis is seen within both lower lobes. No effusion or pneumothorax. Mild centrilobular emphysema is noted in the upper lobes. Upper Abdomen: Cholecystectomy. Fatty atrophy of the pancreas.Partially visualized adrenal glands with slight fullness of the left adrenal gland.  Please refer to the same day CT abdomen and pelvis for further detail. Musculoskeletal: No acute nor suspicious osseous lesions. IMPRESSION: 1. Superior segment left lower lobe mass measuring 3.7 x 3.6 x 3.9 cm. Associated left lower paratracheal lymphadenopathy measuring to 13 mm short axis. Findings are concerning for primary bronchogenic carcinoma with possible mediastinal lymphadenopathy/metastasis. Patient may benefit from PET-CT imaging and/or tissue sampling. 2. Hypodense left sided thyroid nodules measuring 4 mm and 15 mm for which ultrasound correlation may also prove useful. Electronically Signed   By: Ashley Royalty M.D.   On: 02/21/2017 01:58   Ct Abdomen Pelvis W Contrast  Result Date: 02/21/2017 CLINICAL DATA:  61 year old male with upper abdominal pain, nausea and vomiting. EXAM: CT ABDOMEN AND PELVIS WITH CONTRAST TECHNIQUE: Multidetector CT imaging of the abdomen and pelvis was performed using the standard protocol following bolus administration of intravenous contrast. CONTRAST:  31m ISOVUE-300 IOPAMIDOL  (ISOVUE-300) INJECTION 61%, 1086mISOVUE-300 IOPAMIDOL (ISOVUE-300) INJECTION 61% COMPARISON:  Chest radiograph dated 06/10/2016 and CT of the abdomen pelvis dated 06/07/2016. FINDINGS: Lower chest: There is a 3.5 x 3.0 cm solid mass with lobulated margins in the superior segment of the left lower lobe concerning for malignancy. This may represent a primary lung neoplasm versus metastatic disease. CT of the chest is recommended for complete evaluation of the lungs. There are bibasilar linear atelectasis/ scarring. There is nodularity of the anterior mediastinal fat. There is no intra-abdominal free air or free fluid. Hepatobiliary: Cholecystectomy. The liver is unremarkable. No intrahepatic biliary ductal dilatation. Pancreas: Unremarkable. No pancreatic ductal dilatation or surrounding inflammatory changes. Spleen: Normal in size without focal abnormality. Adrenals/Urinary Tract: Stable appearing indeterminate left adrenal nodule, likely benign. The right adrenal gland appears unremarkable. The kidneys, visualized ureters, and urinary bladder appear unremarkable as well. Stomach/Bowel: There is no evidence of bowel obstruction or active inflammation. Normal appendix. Vascular/Lymphatic: No significant vascular findings are present. No enlarged abdominal or pelvic lymph nodes.Mild aortoiliac atherosclerotic disease. Reproductive: The prostate and seminal vesicles are grossly unremarkable. Other: Small fat containing umbilical hernia. Musculoskeletal: Degenerative changes of the spine. L4-L5 and L5-S1 facet hypertrophy. No acute osseous pathology. IMPRESSION: 1. A 3.0 x 3.5 cm mass in the left lower lobe most consistent with malignancy, possibly metastatic disease versus primary pulmonary neoplasm. CT of the chest is recommended for complete evaluation of the lungs. 2. No acute intra-abdominal or pelvic pathology. 3. Stable appearing left adrenal nodularity, likely benign. Electronically Signed   By: ArAnner CreteM.D.   On: 02/21/2017 00:51    Procedures Procedures (including critical care time)  Medications Ordered in ED Medications  prochlorperazine (COMPAZINE) injection 10 mg (10 mg Intravenous Given 02/21/17 0343)  sodium chloride 0.9 % bolus 1,000 mL (0 mLs Intravenous Stopped 02/21/17 0218)  ondansetron (ZOFRAN) injection 4 mg (4 mg Intravenous Given 02/20/17 2330)  HYDROmorphone (DILAUDID) injection 1 mg (1 mg Intravenous Given 02/20/17 2330)  iopamidol (ISOVUE-300) 61 % injection (30 mLs  Contrast Given 02/21/17 0023)  iopamidol (ISOVUE-300) 61 % injection 100 mL (100 mLs Intravenous Contrast Given 02/21/17 0023)  morphine 4 MG/ML injection 4 mg (4 mg Intravenous Given 02/21/17 0344)     Initial Impression / Assessment and Plan / ED Course  I have reviewed the triage vital signs and the nursing notes.  Pertinent labs & imaging results that were available during my care of the patient were reviewed by me and considered in my medical decision making (see chart for details).     Patient feels  better at this time.  Suspect his presenting complaint is related to viral GI illness.  Fluids given.  Nausea much better.  Pain improving.  CT of his abdomen pelvis without significant abnormality.  He did however have an incidental finding of a new left lung mass for which she underwent CT imaging of his chest while here in the emergency department.  CT scan concerning for primary lung cancer.  Patient and sister made aware and shown images of the lung mass.  He understands importance for close follow-up and tissue sampling for formal diagnosis.  Patient will be referred to the [facility] pulmonary clinic.  All questions answered.  Patient understands to return to the ER as needed  Final Clinical Impressions(s) / ED Diagnoses   Final diagnoses:  Acute vomiting  Acute abdominal pain  Mass of left lung    New Prescriptions New Prescriptions   No medications on file   I personally performed the services  described in this documentation, which was scribed in my presence. The recorded information has been reviewed and is accurate.       [name], MD 02/21/17 [phone]

## 2017-02-21 ENCOUNTER — Emergency Department (HOSPITAL_COMMUNITY): Payer: Medicare Other

## 2017-02-21 ENCOUNTER — Encounter: Payer: Self-pay | Admitting: Pulmonary Disease

## 2017-02-21 DIAGNOSIS — R1013 Epigastric pain: Secondary | ICD-10-CM | POA: Diagnosis not present

## 2017-02-21 DIAGNOSIS — R1110 Vomiting, unspecified: Secondary | ICD-10-CM | POA: Diagnosis not present

## 2017-02-21 DIAGNOSIS — E041 Nontoxic single thyroid nodule: Secondary | ICD-10-CM | POA: Diagnosis not present

## 2017-02-21 DIAGNOSIS — R109 Unspecified abdominal pain: Secondary | ICD-10-CM | POA: Diagnosis not present

## 2017-02-21 LAB — COMPREHENSIVE METABOLIC PANEL
ALK PHOS: 94 U/L (ref 38–126)
ALT: 12 U/L — ABNORMAL LOW (ref 17–63)
ANION GAP: 11 (ref 5–15)
AST: 18 U/L (ref 15–41)
Albumin: 4.1 g/dL (ref 3.5–5.0)
BILIRUBIN TOTAL: 0.7 mg/dL (ref 0.3–1.2)
BUN: 39 mg/dL — ABNORMAL HIGH (ref 6–20)
CALCIUM: 9.5 mg/dL (ref 8.9–10.3)
CO2: 30 mmol/L (ref 22–32)
Chloride: 94 mmol/L — ABNORMAL LOW (ref 101–111)
Creatinine, Ser: 1.57 mg/dL — ABNORMAL HIGH (ref 0.61–1.24)
GFR calc non Af Amer: 46 mL/min — ABNORMAL LOW (ref >60)
GFR, EST AFRICAN AMERICAN: 54 mL/min — AB (ref >60)
Glucose, Bld: 141 mg/dL — ABNORMAL HIGH (ref 65–99)
POTASSIUM: 4.2 mmol/L (ref 3.5–5.1)
Sodium: 135 mmol/L (ref 135–145)
TOTAL PROTEIN: 8.7 g/dL — AB (ref 6.5–8.1)

## 2017-02-21 LAB — LIPASE, BLOOD: LIPASE: 28 U/L (ref 11–51)

## 2017-02-21 MED ORDER — IOPAMIDOL (ISOVUE-300) INJECTION 61%
100.0000 mL | Freq: Once | INTRAVENOUS | Status: AC | PRN
  Administered 2017-02-21: 100 mL via INTRAVENOUS

## 2017-02-21 MED ORDER — PROMETHAZINE HCL 25 MG RE SUPP: 25.0000 mg | Freq: Four times a day (QID) | RECTAL | 0 refills | Status: DC | PRN

## 2017-02-21 MED ORDER — MORPHINE SULFATE (PF) 4 MG/ML IV SOLN
4.0000 mg | Freq: Once | INTRAVENOUS | Status: AC
  Administered 2017-02-21: 4 mg via INTRAVENOUS
  Filled 2017-02-21: qty 1

## 2017-02-21 MED ORDER — PROCHLORPERAZINE EDISYLATE 5 MG/ML IJ SOLN
10.0000 mg | Freq: Four times a day (QID) | INTRAMUSCULAR | Status: DC | PRN
  Administered 2017-02-21: 10 mg via INTRAVENOUS
  Filled 2017-02-21: qty 2

## 2017-02-21 MED ORDER — ONDANSETRON 8 MG PO TBDP: 8.0000 mg | ORAL_TABLET | Freq: Three times a day (TID) | ORAL | 0 refills | Status: DC | PRN

## 2017-02-21 NOTE — Discharge Instructions (Signed)
It is VERY important that you follow up with the lung specialists regarding the mass in your left lung

## 2017-02-21 NOTE — ED Notes (Signed)
Pt and family member state understanding of care given and follow up instructions.  Pt taken to vehicle in wheelchair by RN to be transported home by family member.

## 2017-02-27 ENCOUNTER — Institutional Professional Consult (permissible substitution): Payer: Medicare Other | Admitting: Pulmonary Disease

## 2017-03-01 ENCOUNTER — Encounter: Payer: Self-pay | Admitting: Pulmonary Disease

## 2017-03-01 ENCOUNTER — Ambulatory Visit (INDEPENDENT_AMBULATORY_CARE_PROVIDER_SITE_OTHER): Payer: Medicare Other | Admitting: Pulmonary Disease

## 2017-03-01 VITALS — HR 99 | Ht 67.0 in | Wt 214.0 lb

## 2017-03-01 DIAGNOSIS — Z87891 Personal history of nicotine dependence: Secondary | ICD-10-CM

## 2017-03-01 DIAGNOSIS — R0602 Shortness of breath: Secondary | ICD-10-CM | POA: Diagnosis not present

## 2017-03-01 DIAGNOSIS — R918 Other nonspecific abnormal finding of lung field: Secondary | ICD-10-CM | POA: Diagnosis not present

## 2017-03-01 DIAGNOSIS — I5031 Acute diastolic (congestive) heart failure: Secondary | ICD-10-CM | POA: Diagnosis not present

## 2017-03-01 DIAGNOSIS — R0902 Hypoxemia: Secondary | ICD-10-CM | POA: Diagnosis not present

## 2017-03-01 NOTE — Patient Instructions (Signed)
Will arrange for PET scan and pulmonary function test  Follow up in 4 weeks with Dr. Halford Chessman or Nurse Practitioner

## 2017-03-01 NOTE — Progress Notes (Signed)
Past surgical history He  has a past surgical history that includes Cholecystectomy; Colonoscopy (N/A, 07/31/2016); Esophagogastroduodenoscopy (N/A, 07/31/2016); Appendectomy; and Hernia repair.  Family history His family history includes Asthma in his father; Diabetes in his mother.  Social history He  reports that he quit smoking about 4 years ago. His smoking use included Cigarettes. He has a 20.00 pack-year smoking history. He quit smokeless tobacco use about 2 years ago. His smokeless tobacco use included Chew. He reports that he does not drink alcohol or use drugs.  Allergies  Allergen Reactions  . Nutritional Supplements Hives and Swelling  . Penicillins     Pt has tolerated cephalosporins in the past Has patient had a PCN reaction causing immediate rash, facial/tongue/throat swelling, SOB or lightheadedness with hypotension: unknown Has patient had a PCN reaction causing severe rash involving mucus membranes or skin necrosis: unknown Has patient had a PCN reaction that required hospitalization: unknown Has patient had a PCN reaction occurring within the last 10 years: unknown If all of the above answers are "NO", then may proceed with Cephalosporin [place]    Review of Systems  Constitutional: Negative for fever and unexpected weight change.  HENT: Negative for congestion, dental problem, ear pain, nosebleeds, postnasal drip, rhinorrhea, sinus pressure, sneezing, sore throat and trouble swallowing.   Eyes: Negative for redness and itching.  Respiratory: Positive for shortness of breath. Negative for cough, chest tightness and wheezing.   Cardiovascular: Negative for palpitations ( irregular heartbeat) and leg swelling.  Gastrointestinal: Positive for abdominal pain. Negative for nausea and vomiting.  Genitourinary: Negative for dysuria.  Musculoskeletal: Negative for joint swelling.  Skin: Negative for rash.  Neurological: Negative for headaches.  Hematological: Does not bruise/bleed  easily.  Psychiatric/Behavioral: Negative for dysphoric mood. The patient is not nervous/anxious.     Current Outpatient Prescriptions on File Prior to Visit  Medication Sig  . aspirin EC 81 MG EC tablet Take 1 tablet (81 mg total) by mouth daily.  . carvedilol (COREG) 6.25 MG tablet Take 1 tablet (6.25 mg total) by mouth 2 (two) times daily with a meal.  . furosemide (LASIX) 40 MG tablet Take 3 tablets (120 mg total) by mouth daily.  Marland Kitchen lisinopril (PRINIVIL,ZESTRIL) 5 MG tablet Take 1 tablet (5 mg total) by mouth daily.  . metolazone (ZAROXOLYN) 2.5 MG tablet TAKE ONE TABLET BY MOUTH ONCE DAILY  . ondansetron (ZOFRAN ODT) 8 MG disintegrating tablet Take 1 tablet (8 mg total) by mouth every 8 (eight) hours as needed for nausea or vomiting.  . potassium chloride (K-DUR) 10 MEQ tablet Take 10 meq (1 pill) when you take lasix 60 mg (take KCL every OTHER day)  . promethazine (PHENERGAN) 25 MG suppository Place 1 suppository (25 mg total) rectally every 6 (six) hours as needed for nausea or vomiting.   No current facility-administered medications on file prior to visit.     Chief Complaint  Patient presents with  . PULMONARY CONSULT    Referred by Dr [name] for lung mass. CT 02/21/17.     Pulmonary tests CT chest 02/21/17 >> superior segment LLL 3.9 cm mass, 1.3 cm Lt paratracheal LAN  Cardiac tests Echo 06/08/16 >> mod LVH, EF 60 to 65%  Past medical history He  has a past medical history of Asthma; CHF (congestive heart failure) ([place]); and Perforated bowel ([place]).  Vital signs Pulse 99   Ht '5\' 7"'$  (1.702 m)   Wt 214 lb (97.1 kg)   SpO2 90%   BMI  33.52 kg/m   History of present illness James Zamora is a 61 y.o. male former smoker with lung mass.  He was seen in the ER in April for abdominal pain.  He had CT imaging which showed LLL mass.    He used to smoked 1 to 2 packs per day.  He worked on a farm.  No history of pneumonia or TB.  He is from [place].  No  recent travel, sick exposures, or animal exposures.  He gets winded after walking about 50 feet.  He uses 2 liters oxygen with activity and sleeps.  Says this is related to having heart failure.  He denies cough, wheeze, sputum, fever, chest pain, hemoptysis, hoarseness, change in vision, skin rash, joint swelling, or leg swelling.   Physical exam  General - No distress, wearing oxygen Eyes - pupils reactive ENT - No sinus tenderness, no oral exudate, no LAN, no thyromegaly, TM clear, pupils equal/reactive Cardiac - s1s2 regular, no murmur, pulses symmetric Chest - No wheeze/rales/dullness, good air entry, normal respiratory excursion Back - No focal tenderness Abd - Soft, non-tender, no organomegaly, + bowel sounds Ext - No edema Neuro - Normal strength, cranial nerves intact Skin - No rashes Psych - Normal mood, and behavior   CMP Latest Ref Rng & Units 02/20/2017 01/10/2017 11/30/2016  Glucose 65 - 99 mg/dL 141(H) 111(H) 97  BUN 6 - 20 mg/dL 39(H) 13 10  Creatinine 0.61 - 1.24 mg/dL 1.57(H) 1.21 1.13  Sodium 135 - 145 mmol/L 135 140 140  Potassium 3.5 - 5.1 mmol/L 4.2 3.9 4.3  Chloride 101 - 111 mmol/L 94(L) 99 102  CO2 22 - 32 mmol/L 30 34(H) 32(H)  Calcium 8.9 - 10.3 mg/dL 9.5 9.3 9.0  Total Protein 6.5 - 8.1 g/dL 8.7(H) - -  Total Bilirubin 0.3 - 1.2 mg/dL 0.7 - -  Alkaline Phos 38 - 126 U/L 94 - -  AST 15 - 41 U/L 18 - -  ALT 17 - 63 U/L 12(L) - -     CBC Latest Ref Rng & Units 02/20/2017 11/27/2016 06/26/2016  WBC 4.0 - 10.5 K/uL 8.5 5.7 4.8  Hemoglobin 13.0 - 17.0 g/dL 14.2 13.4 11.3(L)  Hematocrit 39.0 - 52.0 % 43.6 43.4 39.0  Platelets 150 - 400 K/uL 277 308 312    Ct Chest Wo Contrast  Result Date: 02/21/2017 CLINICAL DATA:  Pulmonary mass seen on CT abdomen study EXAM: CT CHEST WITHOUT CONTRAST TECHNIQUE: Multidetector CT imaging of the chest was performed following the standard protocol without IV contrast. COMPARISON:  Chest CT 12/31/2009, CXR exams dating back  through 11/13/2015 FINDINGS: Cardiovascular: Visualized cardiac chambers are normal in size. Trace pericardial effusion versus pleural thickening anteriorly. Tiny nodular epicardial nodular densities are noted similar to 2011 and may reflect small lymph nodes. Minimal coronary arteriosclerosis along the LAD. Dilated main pulmonary artery to 4.1 cm suggesting a component of pulmonary hypertension. No aortic aneurysm. There is aortic atherosclerosis. Normal branch pattern of the great vessels. Mediastinum/Nodes: Hypodense nodules of the left thyroid gland are noted measuring up 4 and 15 mm. These can be correlated with ultrasound. The trachea and mainstem bronchi appear patent. 13 mm short axis left lower paratracheal lymph node is noted previously 10 mm. Lungs/Pleura: Within the superior segment of the left lower lobe is a gait 3.7 x 3.6 x 3.9 cm pulmonary mass concerning for primary bronchogenic carcinoma, not apparent on prior chest CT from 2011. Minimal subsegmental atelectasis is seen within both  lower lobes. No effusion or pneumothorax. Mild centrilobular emphysema is noted in the upper lobes. Upper Abdomen: Cholecystectomy. Fatty atrophy of the pancreas.Partially visualized adrenal glands with slight fullness of the left adrenal gland. Please refer to the same day CT abdomen and pelvis for further detail. Musculoskeletal: No acute nor suspicious osseous lesions. IMPRESSION: 1. Superior segment left lower lobe mass measuring 3.7 x 3.6 x 3.9 cm. Associated left lower paratracheal lymphadenopathy measuring to 13 mm short axis. Findings are concerning for primary bronchogenic carcinoma with possible mediastinal lymphadenopathy/metastasis. Patient may benefit from PET-CT imaging and/or tissue sampling. 2. Hypodense left sided thyroid nodules measuring 4 mm and 15 mm for which ultrasound correlation may also prove useful. Electronically Signed   By: Ashley Royalty M.D.   On: 02/21/2017 01:58   Ct Abdomen Pelvis W  Contrast  Result Date: 02/21/2017 CLINICAL DATA:  61 year old male with upper abdominal pain, nausea and vomiting. EXAM: CT ABDOMEN AND PELVIS WITH CONTRAST TECHNIQUE: Multidetector CT imaging of the abdomen and pelvis was performed using the standard protocol following bolus administration of intravenous contrast. CONTRAST:  86m ISOVUE-300 IOPAMIDOL (ISOVUE-300) INJECTION 61%, 1074mISOVUE-300 IOPAMIDOL (ISOVUE-300) INJECTION 61% COMPARISON:  Chest radiograph dated 06/10/2016 and CT of the abdomen pelvis dated 06/07/2016. FINDINGS: Lower chest: There is a 3.5 x 3.0 cm solid mass with lobulated margins in the superior segment of the left lower lobe concerning for malignancy. This may represent a primary lung neoplasm versus metastatic disease. CT of the chest is recommended for complete evaluation of the lungs. There are bibasilar linear atelectasis/ scarring. There is nodularity of the anterior mediastinal fat. There is no intra-abdominal free air or free fluid. Hepatobiliary: Cholecystectomy. The liver is unremarkable. No intrahepatic biliary ductal dilatation. Pancreas: Unremarkable. No pancreatic ductal dilatation or surrounding inflammatory changes. Spleen: Normal in size without focal abnormality. Adrenals/Urinary Tract: Stable appearing indeterminate left adrenal nodule, likely benign. The right adrenal gland appears unremarkable. The kidneys, visualized ureters, and urinary bladder appear unremarkable as well. Stomach/Bowel: There is no evidence of bowel obstruction or active inflammation. Normal appendix. Vascular/Lymphatic: No significant vascular findings are present. No enlarged abdominal or pelvic lymph nodes.Mild aortoiliac atherosclerotic disease. Reproductive: The prostate and seminal vesicles are grossly unremarkable. Other: Small fat containing umbilical hernia. Musculoskeletal: Degenerative changes of the spine. L4-L5 and L5-S1 facet hypertrophy. No acute osseous pathology. IMPRESSION: 1. A 3.0  x 3.5 cm mass in the left lower lobe most consistent with malignancy, possibly metastatic disease versus primary pulmonary neoplasm. CT of the chest is recommended for complete evaluation of the lungs. 2. No acute intra-abdominal or pelvic pathology. 3. Stable appearing left adrenal nodularity, likely benign. Electronically Signed   By: ArAnner Crete.D.   On: 02/21/2017 00:51     Discussion [age]o male former smoker with incidental finding of LLL lung mass.  Concern is for primary lung cancer.  Assessment/plan  Lt lower lung mass. - will get PET scan and PFT - will then determine if he should have biopsy attempt (with radiology versus bronchoscopy), or be referred to thoracic surgery  History of diastolic congestive heart failure. - he is on supplemental oxygen due to CHF with edema - might need further cardiac assessment if he is felt to be surgical candidate for lung mass   Patient Instructions  Will arrange for PET scan and pulmonary function test  Follow up in 4 weeks with Dr. SoHalford Chessmanr Nurse Practitioner    ViChesley MiresMD [place]:  [phone]/12/2016, 3:11  PM

## 2017-03-01 NOTE — Progress Notes (Signed)
   Subjective:    Patient ID: James Zamora, male    DOB: 1956/09/08, 61 y.o.   MRN: [id number]  HPI    Review of Systems  Constitutional: Negative for fever and unexpected weight change.  HENT: Negative for congestion, dental problem, ear pain, nosebleeds, postnasal drip, rhinorrhea, sinus pressure, sneezing, sore throat and trouble swallowing.   Eyes: Negative for redness and itching.  Respiratory: Positive for shortness of breath. Negative for cough, chest tightness and wheezing.   Cardiovascular: Negative for palpitations ( irregular heartbeat) and leg swelling.  Gastrointestinal: Positive for abdominal pain. Negative for nausea and vomiting.  Genitourinary: Negative for dysuria.  Musculoskeletal: Negative for joint swelling.  Skin: Negative for rash.  Neurological: Negative for headaches.  Hematological: Does not bruise/bleed easily.  Psychiatric/Behavioral: Negative for dysphoric mood. The patient is not nervous/anxious.        Objective:   Physical Exam        Assessment & Plan:

## 2017-03-05 DIAGNOSIS — R0902 Hypoxemia: Secondary | ICD-10-CM | POA: Diagnosis not present

## 2017-03-05 DIAGNOSIS — Z6835 Body mass index (BMI) 35.0-35.9, adult: Secondary | ICD-10-CM | POA: Diagnosis not present

## 2017-03-05 DIAGNOSIS — I5030 Unspecified diastolic (congestive) heart failure: Secondary | ICD-10-CM | POA: Diagnosis not present

## 2017-03-05 DIAGNOSIS — D649 Anemia, unspecified: Secondary | ICD-10-CM | POA: Diagnosis not present

## 2017-03-05 DIAGNOSIS — I951 Orthostatic hypotension: Secondary | ICD-10-CM | POA: Diagnosis not present

## 2017-03-06 ENCOUNTER — Other Ambulatory Visit (HOSPITAL_COMMUNITY): Payer: Self-pay | Admitting: Family Medicine

## 2017-03-06 ENCOUNTER — Ambulatory Visit (HOSPITAL_COMMUNITY)
Admission: RE | Admit: 2017-03-06 | Discharge: 2017-03-06 | Disposition: A | Discharge status: Home / Self Care | Payer: Medicare Other | Source: Ambulatory Visit | Attending: Family Medicine | Admitting: Family Medicine

## 2017-03-06 DIAGNOSIS — I959 Hypotension, unspecified: Secondary | ICD-10-CM | POA: Diagnosis not present

## 2017-03-06 DIAGNOSIS — R0781 Pleurodynia: Secondary | ICD-10-CM | POA: Diagnosis not present

## 2017-03-06 DIAGNOSIS — T1490XA Injury, unspecified, initial encounter: Secondary | ICD-10-CM | POA: Diagnosis not present

## 2017-03-06 DIAGNOSIS — S299XXA Unspecified injury of thorax, initial encounter: Secondary | ICD-10-CM | POA: Diagnosis not present

## 2017-03-06 DIAGNOSIS — C3490 Malignant neoplasm of unspecified part of unspecified bronchus or lung: Secondary | ICD-10-CM | POA: Diagnosis not present

## 2017-03-06 DIAGNOSIS — J984 Other disorders of lung: Secondary | ICD-10-CM | POA: Diagnosis not present

## 2017-03-06 DIAGNOSIS — D649 Anemia, unspecified: Secondary | ICD-10-CM | POA: Diagnosis not present

## 2017-03-06 DIAGNOSIS — I110 Hypertensive heart disease with heart failure: Secondary | ICD-10-CM | POA: Diagnosis not present

## 2017-03-06 DIAGNOSIS — J45909 Unspecified asthma, uncomplicated: Secondary | ICD-10-CM | POA: Diagnosis not present

## 2017-03-06 DIAGNOSIS — W19XXXA Unspecified fall, initial encounter: Secondary | ICD-10-CM | POA: Insufficient documentation

## 2017-03-06 DIAGNOSIS — Z87891 Personal history of nicotine dependence: Secondary | ICD-10-CM | POA: Diagnosis not present

## 2017-03-06 DIAGNOSIS — R918 Other nonspecific abnormal finding of lung field: Secondary | ICD-10-CM | POA: Diagnosis not present

## 2017-03-06 DIAGNOSIS — I5032 Chronic diastolic (congestive) heart failure: Secondary | ICD-10-CM | POA: Diagnosis not present

## 2017-03-06 DIAGNOSIS — Z9981 Dependence on supplemental oxygen: Secondary | ICD-10-CM | POA: Diagnosis not present

## 2017-03-06 DIAGNOSIS — Z7982 Long term (current) use of aspirin: Secondary | ICD-10-CM | POA: Diagnosis not present

## 2017-03-08 ENCOUNTER — Emergency Department (HOSPITAL_COMMUNITY): Payer: Medicare Other

## 2017-03-08 ENCOUNTER — Emergency Department (HOSPITAL_COMMUNITY)
Admission: EM | Admit: 2017-03-08 | Discharge: 2017-03-08 | Disposition: A | Discharge status: Home / Self Care | Payer: Medicare Other | Attending: Emergency Medicine | Admitting: Emergency Medicine

## 2017-03-08 ENCOUNTER — Encounter (HOSPITAL_COMMUNITY): Payer: Self-pay | Admitting: Emergency Medicine

## 2017-03-08 DIAGNOSIS — Z7982 Long term (current) use of aspirin: Secondary | ICD-10-CM | POA: Insufficient documentation

## 2017-03-08 DIAGNOSIS — Z1389 Encounter for screening for other disorder: Secondary | ICD-10-CM | POA: Diagnosis not present

## 2017-03-08 DIAGNOSIS — Z87891 Personal history of nicotine dependence: Secondary | ICD-10-CM | POA: Insufficient documentation

## 2017-03-08 DIAGNOSIS — Z79899 Other long term (current) drug therapy: Secondary | ICD-10-CM | POA: Insufficient documentation

## 2017-03-08 DIAGNOSIS — Z139 Encounter for screening, unspecified: Secondary | ICD-10-CM

## 2017-03-08 DIAGNOSIS — R918 Other nonspecific abnormal finding of lung field: Secondary | ICD-10-CM | POA: Diagnosis not present

## 2017-03-08 DIAGNOSIS — R109 Unspecified abdominal pain: Secondary | ICD-10-CM | POA: Insufficient documentation

## 2017-03-08 DIAGNOSIS — J45909 Unspecified asthma, uncomplicated: Secondary | ICD-10-CM | POA: Diagnosis not present

## 2017-03-08 DIAGNOSIS — Z0000 Encounter for general adult medical examination without abnormal findings: Secondary | ICD-10-CM | POA: Diagnosis not present

## 2017-03-08 DIAGNOSIS — I509 Heart failure, unspecified: Secondary | ICD-10-CM | POA: Insufficient documentation

## 2017-03-08 LAB — URINALYSIS, ROUTINE W REFLEX MICROSCOPIC
BILIRUBIN URINE: NEGATIVE
GLUCOSE, UA: NEGATIVE mg/dL
Hgb urine dipstick: NEGATIVE
Ketones, ur: NEGATIVE mg/dL
Leukocytes, UA: NEGATIVE
Nitrite: NEGATIVE
PH: 5 (ref 5.0–8.0)
Protein, ur: NEGATIVE mg/dL
SPECIFIC GRAVITY, URINE: 1.016 (ref 1.005–1.030)

## 2017-03-08 LAB — COMPREHENSIVE METABOLIC PANEL
ALK PHOS: 70 U/L (ref 38–126)
ALT: 21 U/L (ref 17–63)
ANION GAP: 7 (ref 5–15)
AST: 18 U/L (ref 15–41)
Albumin: 3.5 g/dL (ref 3.5–5.0)
BUN: 25 mg/dL — ABNORMAL HIGH (ref 6–20)
CALCIUM: 9.2 mg/dL (ref 8.9–10.3)
CO2: 31 mmol/L (ref 22–32)
CREATININE: 1.19 mg/dL (ref 0.61–1.24)
Chloride: 100 mmol/L — ABNORMAL LOW (ref 101–111)
GFR calc non Af Amer: >60 mL/min (ref >60)
Glucose, Bld: 87 mg/dL (ref 65–99)
Potassium: 4.6 mmol/L (ref 3.5–5.1)
SODIUM: 138 mmol/L (ref 135–145)
Total Bilirubin: 0.5 mg/dL (ref 0.3–1.2)
Total Protein: 7.6 g/dL (ref 6.5–8.1)

## 2017-03-08 LAB — CBC WITH DIFFERENTIAL/PLATELET
Basophils Absolute: 0 10*3/uL (ref 0.0–0.1)
Basophils Relative: 1 %
EOS ABS: 0.4 10*3/uL (ref 0.0–0.7)
Eosinophils Relative: 6 %
HEMATOCRIT: 40.2 % (ref 39.0–52.0)
HEMOGLOBIN: 13 g/dL (ref 13.0–17.0)
LYMPHS ABS: 2 10*3/uL (ref 0.7–4.0)
Lymphocytes Relative: 36 %
MCH: 26.5 pg (ref 26.0–34.0)
MCHC: 32.3 g/dL (ref 30.0–36.0)
MCV: 82 fL (ref 78.0–100.0)
MONOS PCT: 8 %
Monocytes Absolute: 0.4 10*3/uL (ref 0.1–1.0)
NEUTROS PCT: 49 %
Neutro Abs: 2.8 10*3/uL (ref 1.7–7.7)
Platelets: 334 10*3/uL (ref 150–400)
RBC: 4.9 MIL/uL (ref 4.22–5.81)
RDW: 15.5 % (ref 11.5–15.5)
WBC: 5.6 10*3/uL (ref 4.0–10.5)

## 2017-03-08 LAB — BRAIN NATRIURETIC PEPTIDE: B Natriuretic Peptide: 19 pg/mL (ref 0.0–100.0)

## 2017-03-08 LAB — TROPONIN I: Troponin I: <0.03 ng/mL (ref <0.03)

## 2017-03-08 NOTE — ED Triage Notes (Signed)
Patient sent from Dr. Cathey Endow office stating renal failure and faxed labs over.

## 2017-03-08 NOTE — ED Notes (Signed)
Pt alert & oriented x4, stable gait. Patient given discharge instructions, paperwork & prescription(s). Patient verbalized understanding. Pt left department in wheelchair escorted by staff. Pt left w/ no further questions. 

## 2017-03-08 NOTE — Discharge Instructions (Signed)
Continue to follow your physician's instructions regarding your medications. Call your regular medical doctor tomorrow morning to schedule a follow up appointment within the next 1 to 2 days.  Return to the Emergency Department immediately sooner if worsening.

## 2017-03-08 NOTE — ED Provider Notes (Signed)
[facility] DEPT Provider Note   CSN: [id number] Arrival date & time: 03/08/17  1825     History   Chief Complaint Chief Complaint  Patient presents with  . Abnormal Lab  . Abdominal Pain    HPI James Zamora is a 61 y.o. male.  HPI  Pt was seen at [facility]. Per pt and his wife, c/o unknown onset and persistence of constant "abnormal labs" that were drawn 3 days ago.  Pt's wife states pt's BP "has been very low" for the past few weeks. Pt's PMD took pt "off all his meds last week" and "told him to eat a lot of extra salt to get his blood pressure up."  Pt was re-evaluated by his PMD 3 days ago and had labs drawn. Pt was called today and told to come to the ED "because his kidney function tests were high." Denies N/V/D, no abd pain, no CP/palpitations, no cough, no fevers.   Past Medical History:  Diagnosis Date  . Asthma   . CHF (congestive heart failure) ([place])   . Perforated bowel [facility])     Patient Active Problem List   Diagnosis Date Noted  . Edema of left lower extremity 11/27/2016  . Swelling   . Acute diastolic CHF (congestive heart failure) ([place]) 06/07/2016  . Emesis 06/07/2016  . Generalized abdominal pain 06/07/2016  . Anemia 06/07/2016  . Dehydration 11/13/2015  . AKI (acute kidney injury) ([place]) 11/13/2015  . Wound infection 11/13/2015  . Pulmonary nodule 11/13/2015    Past Surgical History:  Procedure Laterality Date  . APPENDECTOMY    . CHOLECYSTECTOMY    . COLONOSCOPY N/A 07/31/2016   Procedure: COLONOSCOPY;  Surgeon: Danie Binder, MD;  Location: [facility] ENDO SUITE;  Service: Endoscopy;  Laterality: N/A;  1:45 pm  . ESOPHAGOGASTRODUODENOSCOPY N/A 07/31/2016   Procedure: ESOPHAGOGASTRODUODENOSCOPY (EGD);  Surgeon: Danie Binder, MD;  Location: [facility] ENDO SUITE;  Service: Endoscopy;  Laterality: N/A;  . HERNIA REPAIR         Home Medications    Prior to Admission medications   Medication Sig Start Date End Date Taking? Authorizing Provider    acetaminophen (TYLENOL) 500 MG tablet Take 500 mg by mouth every 6 (six) hours as needed.   Yes [provider]  aspirin EC 81 MG EC tablet Take 1 tablet (81 mg total) by mouth daily. 06/12/16  Yes Kathie Dike, MD  carvedilol (COREG) 6.25 MG tablet Take 1 tablet (6.25 mg total) by mouth 2 (two) times daily with a meal. 06/12/16  Yes Memon, Jolaine Artist, MD  furosemide (LASIX) 40 MG tablet Take 3 tablets (120 mg total) by mouth daily. 01/02/17 04/02/17 Yes Lendon Colonel, NP  lisinopril (PRINIVIL,ZESTRIL) 5 MG tablet Take 1 tablet (5 mg total) by mouth daily. 06/12/16  Yes Kathie Dike, MD  metolazone (ZAROXOLYN) 2.5 MG tablet TAKE ONE TABLET BY MOUTH ONCE DAILY 02/05/17  Yes Fay Records, MD  potassium chloride (K-DUR) 10 MEQ tablet Take 10 meq (1 pill) when you take lasix 60 mg (take KCL every OTHER day) 06/27/16  Yes Fay Records, MD  ondansetron (ZOFRAN ODT) 8 MG disintegrating tablet Take 1 tablet (8 mg total) by mouth every 8 (eight) hours as needed for nausea or vomiting. 02/21/17   [name], MD  promethazine (PHENERGAN) 25 MG suppository Place 1 suppository (25 mg total) rectally every 6 (six) hours as needed for nausea or vomiting. 02/21/17   [name], MD    Family History Family  History  Problem Relation Age of Onset  . Diabetes Mother   . Asthma Father   . Colon cancer Neg Hx     Social History Social History  Substance Use Topics  . Smoking status: Former Smoker    Packs/day: 2.00    Years: 10.00    Types: Cigarettes    Quit date: 07/06/2012  . Smokeless tobacco: Former Systems developer    Types: Chew    Quit date: 07/06/2014  . Alcohol use No     Allergies   Nutritional supplements and Penicillins   Review of Systems Review of Systems ROS: Statement: All systems negative except as marked or noted in the HPI; Constitutional: Negative for fever and chills. ; ; Eyes: Negative for eye pain, redness and discharge. ; ; ENMT: Negative for ear pain, hoarseness, nasal  congestion, sinus pressure and sore throat. ; ; Cardiovascular: Negative for chest pain, palpitations, diaphoresis, dyspnea and peripheral edema. ; ; Respiratory: Negative for cough, wheezing and stridor. ; ; Gastrointestinal: Negative for nausea, vomiting, diarrhea, abdominal pain, blood in stool, hematemesis, jaundice and rectal bleeding. . ; ; Genitourinary: Negative for dysuria, flank pain and hematuria. ; ; Musculoskeletal: Negative for back pain and neck pain. Negative for swelling and trauma.; ; Skin: Negative for pruritus, rash, abrasions, blisters, bruising and skin lesion.; ; Neuro: Negative for headache, lightheadedness and neck stiffness. Negative for weakness, altered level of consciousness, altered mental status, extremity weakness, paresthesias, involuntary movement, seizure and syncope.       Physical Exam Updated Vital Signs BP 110/68 (BP Location: Right Arm)   Pulse (!) 102   Temp 98.3 F (36.8 C) (Oral)   Resp 16   Ht '5\' 7"'$  (1.702 m)   Wt 214 lb (97.1 kg)   SpO2 94%   BMI 33.52 kg/m   20:41:29 Orthostatic Vital Signs RH  Orthostatic Lying   BP- Lying: 103/72  Pulse- Lying: 88      Orthostatic Sitting  BP- Sitting: 102/64  Pulse- Sitting: 90      Orthostatic Standing at 0 minutes  BP- Standing at 0 minutes: 106/65  Pulse- Standing at 0 minutes: 97      Physical Exam 1850: Physical examination:  Nursing notes reviewed; Vital signs and O2 SAT reviewed;  Constitutional: Well developed, Well nourished, Well hydrated, In no acute distress; Head:  Normocephalic, atraumatic; Eyes: EOMI, PERRL, No scleral icterus; ENMT: Mouth and pharynx normal, Mucous membranes moist; Neck: Supple, Full range of motion, No lymphadenopathy; Cardiovascular: Regular rate and rhythm, No gallop; Respiratory: Breath sounds coarse & equal bilaterally, No wheezes.  Speaking full sentences with ease, Normal respiratory effort/excursion; Chest: Nontender, Movement normal; Abdomen: Soft,  Nontender, Nondistended, Normal bowel sounds; Genitourinary: No CVA tenderness; Extremities: Pulses normal, No tenderness, +3 pedal edema bilat.; Neuro: AA&Ox3, Major CN grossly intact.  Speech clear. No gross focal motor or sensory deficits in extremities. Climbs on and off stretcher easily by himself. Gait steady..; Skin: Color normal, Warm, Dry.   ED Treatments / Results  Labs (all labs ordered are listed, but only abnormal results are displayed)   EKG  EKG Interpretation  Date/Time:  Thursday Mar 08 2017 19:10:19 EDT Ventricular Rate:  93 PR Interval:    QRS Duration: 107 QT Interval:  366 QTC Calculation: 456 R Axis:   99 Text Interpretation:  Sinus rhythm Right axis deviation Borderline low voltage, extremity leads Artifact When compared with ECG of 06/07/2016 Nonspecific ST and T wave abnormality Anterior leads is no longer Present Confirmed  by [facility]  MD, Nunzio Cory [phone]) on 03/08/2017 7:47:04 PM       Radiology   Procedures Procedures (including critical care time)  Medications Ordered in ED Medications - No data to display   Initial Impression / Assessment and Plan / ED Course  I have reviewed the triage vital signs and the nursing notes.  Pertinent labs & imaging results that were available during my care of the patient were reviewed by me and considered in my medical decision making (see chart for details).  MDM Reviewed: previous chart, nursing note and vitals Reviewed previous: labs and ECG Interpretation: labs, ECG and x-ray   Results for orders placed or performed during the hospital encounter of 03/08/17  Comprehensive metabolic panel  Result Value Ref Range   Sodium 138 135 - 145 mmol/L   Potassium 4.6 3.5 - 5.1 mmol/L   Chloride 100 (L) 101 - 111 mmol/L   CO2 31 22 - 32 mmol/L   Glucose, Bld 87 65 - 99 mg/dL   BUN 25 (H) 6 - 20 mg/dL   Creatinine, Ser 1.19 0.61 - 1.24 mg/dL   Calcium 9.2 8.9 - 10.3 mg/dL   Total Protein 7.6 6.5 - 8.1 g/dL    Albumin 3.5 3.5 - 5.0 g/dL   AST 18 15 - 41 U/L   ALT 21 17 - 63 U/L   Alkaline Phosphatase 70 38 - 126 U/L   Total Bilirubin 0.5 0.3 - 1.2 mg/dL   GFR calc non Af Amer >60 >60 mL/min   GFR calc Af Amer >60 >60 mL/min   Anion gap 7 5 - 15  Troponin I  Result Value Ref Range   Troponin I <0.03 <0.03 ng/mL  CBC with Differential  Result Value Ref Range   WBC 5.6 4.0 - 10.5 K/uL   RBC 4.90 4.22 - 5.81 MIL/uL   Hemoglobin 13.0 13.0 - 17.0 g/dL   HCT 40.2 39.0 - 52.0 %   MCV 82.0 78.0 - 100.0 fL   MCH 26.5 26.0 - 34.0 pg   MCHC 32.3 30.0 - 36.0 g/dL   RDW 15.5 11.5 - 15.5 %   Platelets 334 150 - 400 K/uL   Neutrophils Relative % 49 %   Neutro Abs 2.8 1.7 - 7.7 K/uL   Lymphocytes Relative 36 %   Lymphs Abs 2.0 0.7 - 4.0 K/uL   Monocytes Relative 8 %   Monocytes Absolute 0.4 0.1 - 1.0 K/uL   Eosinophils Relative 6 %   Eosinophils Absolute 0.4 0.0 - 0.7 K/uL   Basophils Relative 1 %   Basophils Absolute 0.0 0.0 - 0.1 K/uL  Urinalysis, Routine w reflex microscopic  Result Value Ref Range   Color, Urine YELLOW YELLOW   APPearance CLEAR CLEAR   Specific Gravity, Urine 1.016 1.005 - 1.030   pH 5.0 5.0 - 8.0   Glucose, UA NEGATIVE NEGATIVE mg/dL   Hgb urine dipstick NEGATIVE NEGATIVE   Bilirubin Urine NEGATIVE NEGATIVE   Ketones, ur NEGATIVE NEGATIVE mg/dL   Protein, ur NEGATIVE NEGATIVE mg/dL   Nitrite NEGATIVE NEGATIVE   Leukocytes, UA NEGATIVE NEGATIVE  Brain natriuretic peptide  Result Value Ref Range   B Natriuretic Peptide 19.0 0.0 - 100.0 pg/mL   Dg Chest 2 View Result Date: 03/08/2017 CLINICAL DATA:  Renal insufficiency.  History of CHF. EXAM: CHEST  2 VIEW COMPARISON:  Chest x-ray 03/06/2017.  Chest CT 02/21/2017. FINDINGS: The lungs are clear wiithout focal pneumonia, edema, pneumothorax or pleural effusion. Interstitial markings are  diffusely coarsened with chronic features. Known left lower lobe pulmonary mass again noted. Cardiopericardial silhouette is at upper  limits of normal for size. The visualized bony structures of the thorax are intact. Telemetry leads overlie the chest. IMPRESSION: Left lower lobe pulmonary mass as seen previously. Chronic interstitial changes. Electronically Signed   By: Misty Stanley M.D.   On: 03/08/2017 19:38    2120:  PMD faxed labs to ED: ARF on these. Labs repeated in ED and are reassuring. Pt is not orthostatic on VS. Has been ambulatory with steady gait, resps easy, NAD. Pt and family would like to go home now. No clear indication for admission at this time. Dx and testing d/w pt and family.  Questions answered.  Verb understanding, agreeable to d/c home with outpt f/u.   Final Clinical Impressions(s) / ED Diagnoses   Final diagnoses:  None    New Prescriptions New Prescriptions   No medications on file     Francine Graven, DO 03/11/17 1601

## 2017-03-09 ENCOUNTER — Encounter (HOSPITAL_COMMUNITY)
Admission: RE | Admit: 2017-03-09 | Discharge: 2017-03-09 | Disposition: A | Discharge status: Home / Self Care | Payer: Medicare Other | Source: Ambulatory Visit | Attending: Pulmonary Disease | Admitting: Pulmonary Disease

## 2017-03-09 DIAGNOSIS — I5032 Chronic diastolic (congestive) heart failure: Secondary | ICD-10-CM | POA: Diagnosis not present

## 2017-03-09 DIAGNOSIS — J45909 Unspecified asthma, uncomplicated: Secondary | ICD-10-CM | POA: Diagnosis not present

## 2017-03-09 DIAGNOSIS — I110 Hypertensive heart disease with heart failure: Secondary | ICD-10-CM | POA: Diagnosis not present

## 2017-03-09 DIAGNOSIS — C3490 Malignant neoplasm of unspecified part of unspecified bronchus or lung: Secondary | ICD-10-CM | POA: Diagnosis not present

## 2017-03-09 DIAGNOSIS — D649 Anemia, unspecified: Secondary | ICD-10-CM | POA: Diagnosis not present

## 2017-03-09 DIAGNOSIS — Z87891 Personal history of nicotine dependence: Secondary | ICD-10-CM | POA: Insufficient documentation

## 2017-03-09 DIAGNOSIS — R918 Other nonspecific abnormal finding of lung field: Secondary | ICD-10-CM

## 2017-03-09 DIAGNOSIS — I959 Hypotension, unspecified: Secondary | ICD-10-CM | POA: Diagnosis not present

## 2017-03-09 LAB — GLUCOSE, CAPILLARY: Glucose-Capillary: 95 mg/dL (ref 65–99)

## 2017-03-09 MED ORDER — FLUDEOXYGLUCOSE F - 18 (FDG) INJECTION
10.6600 | Freq: Once | INTRAVENOUS | Status: AC | PRN
  Administered 2017-03-09: 10.66 via INTRAVENOUS

## 2017-03-10 LAB — URINE CULTURE

## 2017-03-12 DIAGNOSIS — S20222A Contusion of left back wall of thorax, initial encounter: Secondary | ICD-10-CM | POA: Diagnosis not present

## 2017-03-12 DIAGNOSIS — I951 Orthostatic hypotension: Secondary | ICD-10-CM | POA: Diagnosis not present

## 2017-03-12 DIAGNOSIS — R0902 Hypoxemia: Secondary | ICD-10-CM | POA: Diagnosis not present

## 2017-03-14 DIAGNOSIS — I959 Hypotension, unspecified: Secondary | ICD-10-CM | POA: Diagnosis not present

## 2017-03-14 DIAGNOSIS — I5032 Chronic diastolic (congestive) heart failure: Secondary | ICD-10-CM | POA: Diagnosis not present

## 2017-03-14 DIAGNOSIS — J45909 Unspecified asthma, uncomplicated: Secondary | ICD-10-CM | POA: Diagnosis not present

## 2017-03-14 DIAGNOSIS — I110 Hypertensive heart disease with heart failure: Secondary | ICD-10-CM | POA: Diagnosis not present

## 2017-03-14 DIAGNOSIS — D649 Anemia, unspecified: Secondary | ICD-10-CM | POA: Diagnosis not present

## 2017-03-14 DIAGNOSIS — C3490 Malignant neoplasm of unspecified part of unspecified bronchus or lung: Secondary | ICD-10-CM | POA: Diagnosis not present

## 2017-03-14 NOTE — Progress Notes (Signed)
Cardiology Office Note    Date:  03/16/2017  ID:  James, Zamora [birth year], MRN [id number] PCP:  [name], MD  Cardiologist:  Harrington Challenger  Chief Complaint: blood pressure medication adjustment  History of Present Illness:  James Zamora is a 61 y.o. male with history of chronic diastolic CHF, chronic LEE L>R, asthma, prior perforated bowel, obesity, chronic oxygen use, recent lung mass concerning for cancer who presents back to clinic for evaluation of abnormal EKG. To recap, 2D Echo 05/2016 showed mod LVH, EF 26-94%, normal diastolic parameters, mild RV enlargement. His chronic LEE goes way back for many many years. He's had numerous LE venous duplexes to rule out DVT given asymmetric appearance with L>R which began after he broke that leg twice many years ago. More recently he's been seen by pulm for evaluation of left lower lobe mass with PET Scan 03/09/17 with hypermetabolic LLL mass c/w primary bronchogenic carcinoma, hypermetabolic mediastinal lymph node suspicious for lymph node metastasis, and 2.7 cm hypermetabolic soft tissue density in pancreatic head (Differential diagnosis includes primary pancreatic carcinoma and metastatic peripancreatic lymphadenopathy). He was seen in the ED 03/08/17 for evaluation of ?abnormal labs. Per ED notes, patient's PMD took him off all meds and told him to eat a lot of salt to get BP up. Labs showed normal BNP, CBC, troponin, K 4.6, Cr 1.19, Review of PCP note from 03/05/17 indicates sitting BP 116/88, coming down to 88/52 in right arm and 76/46 in left arm with standing, pulse going from 80->92. Note this has occurred in the setting of recent poor appetite. Baseline pulse appears in the 90s-low 100s per review of Epic.   He returns for follow-up today. He does not talk much. In general he actually says he's "feeling pretty good lately." Denies CP or SOB. His sister provides much of the history. His edema is "slightly worse today" per their report. It's  fairly impressive on exam but seems to wax and wane in most office visits with cardiology. Since the above PCP/ED visits, he has been eating salty meals including french fries and drinking lots of soda given the above recent low blood pressure. His appetite has improved with these measures but his edema has worsened. Baseline weight previously fell somewhere between 220-224. It dropped to 214 in the ED but patient's sister states this was because he was hardly eating anything at all 2 weeks ago. He is up to 224 today. At this point the only medication he is on is Aspirin. (No longer on carvedilol, lisinopril, Lasix, metolazone, potassium as below).   Past Medical History:  Diagnosis Date  . Asthma   . Chronic diastolic CHF (congestive heart failure) ([place])   . Lung mass   . Obesity   . Perforated bowel [facility])     Past Surgical History:  Procedure Laterality Date  . APPENDECTOMY    . CHOLECYSTECTOMY    . COLONOSCOPY N/A 07/31/2016   Procedure: COLONOSCOPY;  Surgeon: Danie Binder, MD;  Location: [facility] END[facility] SUITE;  Service: Endoscopy;  Laterality: N/A;  1:45 pm  . ESOPHAGOGASTRODUODENOSCOPY N/A 07/31/2016   Procedure: ESOPHAGOGASTRODUODENOSCOPY (EGD);  Surgeon: Danie Binder, MD;  Location: [facility] ENDO SUITE;  Service: Endoscopy;  Laterality: N/A;  . HERNIA REPAIR      Current Medications:  Outpatient Medications Prior to Visit  Medication Sig Dispense Refill  . acetaminophen (TYLENOL) 500 MG tablet Take 500 mg by mouth every 6 (six) hours as needed.    Marland Kitchen aspirin EC  81 MG EC tablet Take 1 tablet (81 mg total) by mouth daily. 30 tablet 1  . carvedilol (COREG) 6.25 MG tablet Take 1 tablet (6.25 mg total) by mouth 2 (two) times daily with a meal. 60 tablet 1  . furosemide (LASIX) 40 MG tablet Take 3 tablets (120 mg total) by mouth daily. 90 tablet 6  . lisinopril (PRINIVIL,ZESTRIL) 5 MG tablet Take 1 tablet (5 mg total) by mouth daily. 30 tablet 1  . metolazone (ZAROXOLYN) 2.5 MG tablet TAKE ONE  TABLET BY MOUTH ONCE DAILY 30 tablet 6  . ondansetron (ZOFRAN ODT) 8 MG disintegrating tablet Take 1 tablet (8 mg total) by mouth every 8 (eight) hours as needed for nausea or vomiting. 10 tablet 0  . potassium chloride (K-DUR) 10 MEQ tablet Take 10 meq (1 pill) when you take lasix 60 mg (take KCL every OTHER day) 45 tablet 3  . promethazine (PHENERGAN) 25 MG suppository Place 1 suppository (25 mg total) rectally every 6 (six) hours as needed for nausea or vomiting. 12 each 0   No facility-administered medications prior to visit.      Allergies:   Nutritional supplements and Penicillins   Social History   Social History  . Marital status: Single    Spouse name: N/A  . Number of children: N/A  . Years of education: N/A   Social History Main Topics  . Smoking status: Former Smoker    Packs/day: 2.00    Years: 10.00    Types: Cigarettes    Quit date: 07/06/2012  . Smokeless tobacco: Former Systems developer    Types: Chew    Quit date: 07/06/2014  . Alcohol use No  . Drug use: No  . Sexual activity: Not Asked   Other Topics Concern  . None   Social History Narrative  . None     Family History:  Family History  Problem Relation Age of Onset  . Diabetes Mother   . Asthma Father   . Colon cancer Neg Hx    ROS:   Please see the history of present illness.  All other systems are reviewed and otherwise negative.    PHYSICAL EXAM:   VS:  BP 104/70   Pulse 98   Ht '5\' 6"'$  (1.676 m)   Wt 224 lb (101.6 kg)   SpO2 90%   BMI 36.15 kg/m   BMI: Body mass index is 36.15 kg/m. GEN: Well nourished obese AAM in no acute distress  HEENT: normocephalic, atraumatic Neck: no JVD, carotid bruits, or masses Cardiac: RRR; no murmurs, rubs, or gallops, bilateral lower edema - 3+ LLE, 1+ RLE Respiratory:  clear to auscultation bilaterally, normal work of breathing GI: soft, nontender, nondistended, + BS MS: no deformity or atrophy  Skin: warm and dry, no rash Neuro:  Alert and Oriented x 3,  Strength and sensation are intact, follows commands Psych: euthymic mood, full affect  Wt Readings from Last 3 Encounters:  03/16/17 224 lb (101.6 kg)  03/08/17 214 lb (97.1 kg)  03/01/17 214 lb (97.1 kg)      Studies/Labs Reviewed:   EKG:  EKG was not ordered today but reviewed from PCP office 03/05/17: NSR, NSIVCD, borderline low voltage in frontal leads, probable LVH, no acute changes  Recent Labs: 06/26/2016: TSH 0.58 03/08/2017: ALT 21; B Natriuretic Peptide 19.0; BUN 25; Creatinine, Ser 1.19; Hemoglobin 13.0; Platelets 334; Potassium 4.6; Sodium 138   Lipid Panel No results found for: CHOL, TRIG, HDL, CHOLHDL, VLDL, LDLCALC, LDLDIRECT  Additional  studies/ records that were reviewed today include: Summarized above    ASSESSMENT & PLAN:   1. Orthostatic hypotension - etiology not totally clear but this has required cessation of essentially all of his antihypertensives from prior. BP today remained on the low side but stable with standing - no associated dizziness. See below re: med resumption of Lasix. I would hold off on any other meds at this time. His pulse does not appear significantly elevated above his baseline even off beta blocker. If his orthostasis begins to recur, would consider midodrine. He is not a candidate for Florinef given his h/o lower extremity edema. 2. Chronic diastolic CHF/chronic lower extremity edema - recent ED note with 3+ BLE edema was associated with stone cold normal BNP. Given h/o injury to this leg I am inclined to think this is more likely to be venous insufficiency related to prior vascular injury. His blood pressure will not permit more aggressive diuresis. I would recommend he go back to 2,'000mg'$  sodium restriction and 64oz fluid restriction. Will add back Lasix '20mg'$  daily PRN for days when he has worsening edema on board, as long as BP is not on the low side. Sister will be looking into a cuff. He was previously on high dose diuretics ('120mg'$  Lasix  daily and daily metolazone) but clearly at this point his BP will not support it. He may also have a component of right heart dysfunction related to underlying lung disease, so do not want to drop pressure too low. Also discussed importance of compression hose and elevated feet above heart level when resting. Per Dr. Alan Ripper OV from 11/2016 when patient's edema was 2+ LLE/trace RLE, she was not sure how much more improvement he would see with this. Return precautions reviewed. 3. Lung mass - further evaluation in progress by PCCM. 4. Obesity - could also be contributing to his chronic edema.  Disposition: F/u with Jory Sims in 1 month. As above, return precautions reviewed for any worsening/non-improving edema or symptoms. His sister is his primary caregiver and is also juggling appointments for multiple other family members. The patient himself has several follow-ups coming up with pulm as well to discuss next steps.   Medication Adjustments/Labs and Tests Ordered: Current medicines are reviewed at length with the patient today.  Concerns regarding medicines are outlined above. Medication changes, Labs and Tests ordered today are summarized above and listed in the Patient Instructions accessible in Encounters.   Signed, Charlie Pitter, PA-C  03/16/2017 1:17 PM    [facility] Location in [place]. [place], [place] [address] Ph: [phone]; Fax [phone]

## 2017-03-15 ENCOUNTER — Telehealth: Payer: Self-pay | Admitting: Pulmonary Disease

## 2017-03-15 DIAGNOSIS — K8689 Other specified diseases of pancreas: Secondary | ICD-10-CM

## 2017-03-15 NOTE — Telephone Encounter (Signed)
PET scan 03/09/17 >> 3.7 cm LLL mass 14.9 SUV, 12 mm AP LAN 5.2 SUV, 2.7 cm density pancreatic head 7.4 SUV   Spoke with pt's sister.  Explained PET scan findings.  Need to get MRI abdomen with/without contrast first and then decide how to proceed with biopsy.  He has f/u appt with Tammy Parrett on 03/29/17.

## 2017-03-16 ENCOUNTER — Ambulatory Visit (INDEPENDENT_AMBULATORY_CARE_PROVIDER_SITE_OTHER): Payer: Medicare Other | Admitting: Physician Assistant

## 2017-03-16 ENCOUNTER — Encounter: Payer: Self-pay | Admitting: Physician Assistant

## 2017-03-16 VITALS — BP 104/70 | HR 98 | Ht 66.0 in | Wt 224.0 lb

## 2017-03-16 DIAGNOSIS — C3490 Malignant neoplasm of unspecified part of unspecified bronchus or lung: Secondary | ICD-10-CM | POA: Diagnosis not present

## 2017-03-16 DIAGNOSIS — R918 Other nonspecific abnormal finding of lung field: Secondary | ICD-10-CM

## 2017-03-16 DIAGNOSIS — E6609 Other obesity due to excess calories: Secondary | ICD-10-CM | POA: Diagnosis not present

## 2017-03-16 DIAGNOSIS — J45909 Unspecified asthma, uncomplicated: Secondary | ICD-10-CM | POA: Diagnosis not present

## 2017-03-16 DIAGNOSIS — I959 Hypotension, unspecified: Secondary | ICD-10-CM | POA: Diagnosis not present

## 2017-03-16 DIAGNOSIS — D649 Anemia, unspecified: Secondary | ICD-10-CM | POA: Diagnosis not present

## 2017-03-16 DIAGNOSIS — I951 Orthostatic hypotension: Secondary | ICD-10-CM

## 2017-03-16 DIAGNOSIS — I5032 Chronic diastolic (congestive) heart failure: Secondary | ICD-10-CM

## 2017-03-16 DIAGNOSIS — I110 Hypertensive heart disease with heart failure: Secondary | ICD-10-CM | POA: Diagnosis not present

## 2017-03-16 NOTE — Patient Instructions (Addendum)
For patients with fluid overload/swelling, we give them these special instructions:  1. Follow a low-salt diet and watch your fluid intake. In general, you should not be taking in more than 2 liters of fluid per day (no more than 8 glasses per day). Some patients are restricted to less than 1.5 liters of fluid per day (no more than 6 glasses per day). This includes sources of water in foods like soup, coffee, tea, milk, etc. 2. Weigh yourself on the same scale at same time of day and keep a log. 3. Call your doctor: (Anytime you feel any of the following symptoms)  - 3-4 pound weight gain in 1-2 days or 2 pounds overnight  - Shortness of breath, with or without a dry hacking cough  - Swelling in the hands, feet or stomach  - If you have to sleep on extra pillows at night in order to breathe   IT IS IMPORTANT TO LET YOUR DOCTOR KNOW EARLY ON IF YOU ARE HAVING SYMPTOMS SO WE CAN HELP YOU!  Medication Instructions:  Tale lasix 20 mg DAILY AS NEEDED - FOR SWELLING   Labwork: NONE  Testing/Procedures: NONE   Follow-Up: Your physician recommends that you schedule a follow-up appointment in: 4 WEEKS    Any Other Special Instructions Will Be Listed Below (If Applicable).     If you need a refill on your cardiac medications before your next appointment, please call your pharmacy.

## 2017-03-21 DIAGNOSIS — C3490 Malignant neoplasm of unspecified part of unspecified bronchus or lung: Secondary | ICD-10-CM | POA: Diagnosis not present

## 2017-03-21 DIAGNOSIS — I959 Hypotension, unspecified: Secondary | ICD-10-CM | POA: Diagnosis not present

## 2017-03-21 DIAGNOSIS — I5032 Chronic diastolic (congestive) heart failure: Secondary | ICD-10-CM | POA: Diagnosis not present

## 2017-03-21 DIAGNOSIS — I110 Hypertensive heart disease with heart failure: Secondary | ICD-10-CM | POA: Diagnosis not present

## 2017-03-21 DIAGNOSIS — D649 Anemia, unspecified: Secondary | ICD-10-CM | POA: Diagnosis not present

## 2017-03-21 DIAGNOSIS — J45909 Unspecified asthma, uncomplicated: Secondary | ICD-10-CM | POA: Diagnosis not present

## 2017-03-22 DIAGNOSIS — I110 Hypertensive heart disease with heart failure: Secondary | ICD-10-CM | POA: Diagnosis not present

## 2017-03-22 DIAGNOSIS — D649 Anemia, unspecified: Secondary | ICD-10-CM | POA: Diagnosis not present

## 2017-03-22 DIAGNOSIS — I959 Hypotension, unspecified: Secondary | ICD-10-CM | POA: Diagnosis not present

## 2017-03-22 DIAGNOSIS — I5032 Chronic diastolic (congestive) heart failure: Secondary | ICD-10-CM | POA: Diagnosis not present

## 2017-03-22 DIAGNOSIS — C3490 Malignant neoplasm of unspecified part of unspecified bronchus or lung: Secondary | ICD-10-CM | POA: Diagnosis not present

## 2017-03-22 DIAGNOSIS — J45909 Unspecified asthma, uncomplicated: Secondary | ICD-10-CM | POA: Diagnosis not present

## 2017-03-23 ENCOUNTER — Telehealth: Payer: Self-pay | Admitting: Pulmonary Disease

## 2017-03-23 ENCOUNTER — Other Ambulatory Visit: Payer: Self-pay | Admitting: Pulmonary Disease

## 2017-03-23 ENCOUNTER — Ambulatory Visit (HOSPITAL_COMMUNITY)
Admission: RE | Admit: 2017-03-23 | Discharge: 2017-03-23 | Disposition: A | Discharge status: Home / Self Care | Payer: Medicare Other | Source: Ambulatory Visit | Attending: Pulmonary Disease | Admitting: Pulmonary Disease

## 2017-03-23 ENCOUNTER — Ambulatory Visit (HOSPITAL_COMMUNITY): Payer: Medicare Other

## 2017-03-23 DIAGNOSIS — K8689 Other specified diseases of pancreas: Secondary | ICD-10-CM

## 2017-03-23 NOTE — Telephone Encounter (Signed)
James Zamora is aware. Nothing else was needed at time of call.

## 2017-03-23 NOTE — Telephone Encounter (Signed)
Yes

## 2017-03-23 NOTE — Telephone Encounter (Signed)
Spoke with [name] regarding MRI. Patient is too large for the machines at [facility] and [facility], but [facility] has a machine that should fit.   Dr. Halford Chessman, is it ok for the patient to have this done at [facility] instead? Please advise. Thanks!

## 2017-03-23 NOTE — Telephone Encounter (Signed)
Spoke with [name] again. Now she wants to see if patient can have the "open MRI" done at [facility]. Explained to patient to that "open MRI" is normally done for patients who have issues with closed spaces, has nothing to do with "more imaging".   Dr. Halford Chessman, what are your thoughts on this?

## 2017-03-23 NOTE — Telephone Encounter (Signed)
Preference is for closed MRI.

## 2017-03-28 DIAGNOSIS — C3490 Malignant neoplasm of unspecified part of unspecified bronchus or lung: Secondary | ICD-10-CM | POA: Diagnosis not present

## 2017-03-28 DIAGNOSIS — I959 Hypotension, unspecified: Secondary | ICD-10-CM | POA: Diagnosis not present

## 2017-03-28 DIAGNOSIS — I110 Hypertensive heart disease with heart failure: Secondary | ICD-10-CM | POA: Diagnosis not present

## 2017-03-28 DIAGNOSIS — I5032 Chronic diastolic (congestive) heart failure: Secondary | ICD-10-CM | POA: Diagnosis not present

## 2017-03-28 DIAGNOSIS — D649 Anemia, unspecified: Secondary | ICD-10-CM | POA: Diagnosis not present

## 2017-03-28 DIAGNOSIS — J45909 Unspecified asthma, uncomplicated: Secondary | ICD-10-CM | POA: Diagnosis not present

## 2017-03-29 ENCOUNTER — Ambulatory Visit (INDEPENDENT_AMBULATORY_CARE_PROVIDER_SITE_OTHER): Payer: Medicare Other | Admitting: Adult Health

## 2017-03-29 ENCOUNTER — Ambulatory Visit (INDEPENDENT_AMBULATORY_CARE_PROVIDER_SITE_OTHER): Payer: Medicare Other | Admitting: Pulmonary Disease

## 2017-03-29 ENCOUNTER — Encounter: Payer: Self-pay | Admitting: Adult Health

## 2017-03-29 DIAGNOSIS — J449 Chronic obstructive pulmonary disease, unspecified: Secondary | ICD-10-CM | POA: Diagnosis not present

## 2017-03-29 DIAGNOSIS — Z87891 Personal history of nicotine dependence: Secondary | ICD-10-CM

## 2017-03-29 DIAGNOSIS — R918 Other nonspecific abnormal finding of lung field: Secondary | ICD-10-CM | POA: Diagnosis not present

## 2017-03-29 LAB — PULMONARY FUNCTION TEST
DL/VA % PRED: 64 %
DL/VA: 2.62 ml/min/mmHg/L
DLCO unc % pred: 39 %
DLCO unc: 8.78 ml/min/mmHg
FEF 25-75 POST: 0.49 L/s
FEF 25-75 Pre: 0.33 L/sec
FEF2575-%Change-Post: 49 %
FEF2575-%Pred-Post: 22 %
FEF2575-%Pred-Pre: 14 %
FEV1-%CHANGE-POST: 14 %
FEV1-%PRED-PRE: 34 %
FEV1-%Pred-Post: 39 %
FEV1-PRE: 0.77 L
FEV1-Post: 0.89 L
FEV1FVC-%Change-Post: 0 %
FEV1FVC-%PRED-PRE: 69 %
FEV6-%Change-Post: 15 %
FEV6-%PRED-POST: 58 %
FEV6-%PRED-PRE: 50 %
FEV6-POST: 1.62 L
FEV6-PRE: 1.41 L
FEV6FVC-%Change-Post: 0 %
FEV6FVC-%PRED-POST: 103 %
FEV6FVC-%PRED-PRE: 103 %
FVC-%Change-Post: 15 %
FVC-%PRED-PRE: 48 %
FVC-%Pred-Post: 56 %
FVC-POST: 1.65 L
FVC-PRE: 1.43 L
POST FEV6/FVC RATIO: 98 %
PRE FEV6/FVC RATIO: 99 %
Post FEV1/FVC ratio: 54 %
Pre FEV1/FVC ratio: 54 %
RV % PRED: 148 %
RV: 2.75 L
TLC % PRED: 80 %
TLC: 4.42 L

## 2017-03-29 NOTE — Assessment & Plan Note (Addendum)
LLL lung mass -PET positive with LAN . PET did show pancreatic density . MRI is pending  Once this is return will decide on best step for bx (staging workup )  Discussed this with pt and wife.  Case and scans reviewed with Dr. Halford Chessman

## 2017-03-29 NOTE — Progress Notes (Signed)
@Patient  ID: James Zamora, male    DOB: 1956/10/03, 61 y.o.   MRN: [id number]  Chief Complaint  Patient presents with  . Follow-up    lung mass , PFT     Referring provider: [name], MD  HPI: 61 year old male former smoker seen for pulmonary consult 03/01/2017 for lung mass  TEST  Pulmonary tests CT chest 02/21/17 >> superior segment LLL 3.9 cm mass, 1.3 cm Lt paratracheal LAN PET scan 03/09/17 >> 3.7 cm LLL mass 14.9 SUV, 12 mm AP LAN 5.2 SUV, 2.7 cm density pancreatic head 7.4 SUV  Cardiac tests Echo 06/08/16 >> mod LVH, EF 60 to 65%  03/29/2017 Follow up : Lung Mass /PET /PFT  Patient presented for a two-week follow-up. Patient was seen for pulmonary consult earlier this month for lung mass. CT chest showed a 3.9 cm mass in the left lower lobe. 1.3 cm left paratracheal lymphadenopathy. Subsequent PET scan on May 11 showed hypermetabolic 3.7 cm left lower lobe mass by 2.7 cm density -pancreatic head. He has been set up for a MRI Abd that is planned on 03/31/17 .  PFT done today shows an FEV1 at 39%, ratio 54, FVC 56%, positive bronchodilator response. Total lung capacity 80% DLCO 39%. . We discussed his test results with pt and wife .  Pt has tried inhaler in past but does not remember the name . He denies cough , wheezing  Gets winded with walking . Has been on O2 for 1-2 yrs .  Patient denies any cough, wheezing, chest pain, orthopnea, PND, or increased leg swelling  Allergies  Allergen Reactions  . Nutritional Supplements Hives and Swelling  . Penicillins     Pt has tolerated cephalosporins in the past Has patient had a PCN reaction causing immediate rash, facial/tongue/throat swelling, SOB or lightheadedness with hypotension: unknown Has patient had a PCN reaction causing severe rash involving mucus membranes or skin necrosis: unknown Has patient had a PCN reaction that required hospitalization: unknown Has patient had a PCN reaction occurring within the last 10  years: unknown If all of the above answers are "NO", then may proceed with Cephalosporin [place]    Immunization History  Administered Date(s) Administered  . Influenza,inj,Quad PF,36+ Mos 08/11/2016    Past Medical History:  Diagnosis Date  . Asthma   . Chronic diastolic CHF (congestive heart failure) ([place])   . Lung mass   . Obesity   . Perforated bowel ([place])     Tobacco History: History  Smoking Status  . Former Smoker  . Packs/day: 2.00  . Years: 10.00  . Types: Cigarettes  . Quit date: 07/06/2012  Smokeless Tobacco  . Former Systems developer  . Types: Chew  . Quit date: 07/06/2014   Counseling given: Not Answered   Outpatient Encounter Prescriptions as of 03/29/2017  Medication Sig  . aspirin EC 81 MG EC tablet Take 1 tablet (81 mg total) by mouth daily.  . furosemide (LASIX) 20 MG tablet Take 20 mg by mouth daily as needed.   No facility-administered encounter medications on file as of 03/29/2017.      Review of Systems  Constitutional:   No  weight loss, night sweats,  Fevers, chills,  +fatigue, or  lassitude.  HEENT:   No headaches,  Difficulty swallowing,  Tooth/dental problems, or  Sore throat,                No sneezing, itching, ear ache, nasal congestion, post nasal drip,   CV:  No chest pain,  Orthopnea, PND, swelling in lower extremities, anasarca, dizziness, palpitations, syncope.   GI  No heartburn, indigestion, abdominal pain, nausea, vomiting, diarrhea, change in bowel habits, loss of appetite, bloody stools.   Resp: .  No chest wall deformity  Skin: no rash or lesions.  GU: no dysuria, change in color of urine, no urgency or frequency.  No flank pain, no hematuria   MS:  No joint pain or swelling.  No decreased range of motion.  No back pain.    Physical Exam  BP 124/74 (BP Location: Left Arm, Cuff Size: Normal)   Pulse 83   Ht 5' 2.5" (1.588 m)   Wt 234 lb 6.4 oz (106.3 kg)   SpO2 90%   BMI 42.19 kg/m   GEN: A/Ox3; pleasant , NAD, on O2      HEENT:  [place]/AT,  EACs-clear, TMs-wnl, NOSE-clear, THROAT-clear, no lesions, no postnasal drip or exudate noted.   NECK:  Supple w/ fair ROM; no JVD; normal carotid impulses w/o bruits; no thyromegaly or nodules palpated; no lymphadenopathy.    RESP  decreased breath sounds in the bases w/o, wheezes/ rales/ or rhonchi. no accessory muscle use, no dullness to percussion  CARD:  RRR, no m/r/g, no peripheral edema, pulses intact, no cyanosis or clubbing.  GI:   Soft & nt; nml bowel sounds; no organomegaly or masses detected.   Musco: Warm bil, no deformities or joint swelling noted.   Neuro: alert, no focal deficits noted.    Skin: Warm, no lesions or rashes    Lab Results:  CBC    Component Value Date/Time   WBC 5.6 03/08/2017 1851   RBC 4.90 03/08/2017 1851   HGB 13.0 03/08/2017 1851   HCT 40.2 03/08/2017 1851   PLT 334 03/08/2017 1851   MCV 82.0 03/08/2017 1851   MCH 26.5 03/08/2017 1851   MCHC 32.3 03/08/2017 1851   RDW 15.5 03/08/2017 1851   LYMPHSABS 2.0 03/08/2017 1851   MONOABS 0.4 03/08/2017 1851   EOSABS 0.4 03/08/2017 1851   BASOSABS 0.0 03/08/2017 1851    BMET    Component Value Date/Time   NA 138 03/08/2017 1851   K 4.6 03/08/2017 1851   CL 100 (L) 03/08/2017 1851   CO2 31 03/08/2017 1851   GLUCOSE 87 03/08/2017 1851   BUN 25 (H) 03/08/2017 1851   CREATININE 1.19 03/08/2017 1851   CREATININE 1.21 01/10/2017 1545   CALCIUM 9.2 03/08/2017 1851   GFRNONAA >60 03/08/2017 1851   GFRAA >60 03/08/2017 1851    BNP    Component Value Date/Time   BNP 19.0 03/08/2017 1851   BNP 5.6 06/26/2016 1450    ProBNP No results found for: PROBNP  Imaging: Dg Chest 2 View  Result Date: 03/08/2017 CLINICAL DATA:  Renal insufficiency.  History of CHF. EXAM: CHEST  2 VIEW COMPARISON:  Chest x-ray 03/06/2017.  Chest CT 02/21/2017. FINDINGS: The lungs are clear wiithout focal pneumonia, edema, pneumothorax or pleural effusion. Interstitial markings are diffusely  coarsened with chronic features. Known left lower lobe pulmonary mass again noted. Cardiopericardial silhouette is at upper limits of normal for size. The visualized bony structures of the thorax are intact. Telemetry leads overlie the chest. IMPRESSION: Left lower lobe pulmonary mass as seen previously. Chronic interstitial changes. Electronically Signed   By: Misty Stanley M.D.   On: 03/08/2017 19:38   Dg Ribs Unilateral Left  Result Date: 03/06/2017 CLINICAL DATA:  Status post fall, with left lower rib pain.  Initial encounter. EXAM: LEFT RIBS - 2 VIEW COMPARISON:  CT of the chest performed 02/21/2017, and chest radiograph performed 06/10/2016 FINDINGS: No displaced rib fractures are seen. The lungs are well-aerated. Mild scarring is noted at the lung bases. Peribronchial thickening is seen. The patient's left lower lobe lung mass is partially obscured by the patient's left hilum. There is no evidence of pleural effusion or pneumothorax. The cardiomediastinal silhouette is within normal limits. No acute osseous abnormalities are seen. Clips are noted within the right upper quadrant, reflecting prior cholecystectomy. IMPRESSION: 1. No displaced rib fracture seen. 2. Known left lower lobe lung mass is partially obscured by the patient's left hilum. 3. Mild scarring at the lung bases.  Peribronchial thickening seen. Electronically Signed   By: Garald Balding M.D.   On: 03/06/2017 19:33   Nm Pet Image Initial (pi) Skull Base To Thigh  Result Date: 03/09/2017 CLINICAL DATA:  Initial treatment strategy for left lower lobe mass. EXAM: NUCLEAR MEDICINE PET SKULL BASE TO THIGH TECHNIQUE: 10.7 mCi F-18 FDG was injected intravenously. Full-ring PET imaging was performed from the skull base to thigh after the radiotracer. CT data was obtained and used for attenuation correction and anatomic localization. FASTING BLOOD GLUCOSE:  Value: 95 mg/dl COMPARISON:  Chest CT on 02/21/2017 FINDINGS: NECK No hypermetabolic lymph  nodes in the neck. CHEST 3.7 cm left lower lobe mass shows marked hypermetabolic activity, with SUV max of 14.9. No other suspicious pulmonary nodules seen on CT images. 12 mm AP window lymph node shows FDG uptake, with SUV max 5.2. No other hypermetabolic lymph nodes identified thorax. No evidence of pleural effusion. ABDOMEN/PELVIS No abnormal hypermetabolic activity involving liver, spleen, kidneys, or adrenal glands. A 2 cm low-attenuation left adrenal mass is seen which shows absence of FDG uptake, consistent with benign adenoma. A soft tissue density is seen pancreatic head measuring 1.8 x 2.7 cm on image 107/4. This shows FDG uptake, with SUV max of 7.4. Primary pancreatic carcinoma and metastatic peripancreatic lymphadenopathy cannot be excluded. Prior cholecystectomy noted. No evidence of biliary dilatation. Aortic atherosclerosis. SKELETON No focal hypermetabolic activity to suggest skeletal metastasis. IMPRESSION: 3.7 cm hypermetabolic left lower lobe lung mass, consistent with primary bronchogenic carcinoma. 12 mm hypermetabolic mediastinal lymph node in the AP window, suspicious for lymph node metastasis. 2.7 cm hypermetabolic soft tissue density in pancreatic head. Differential diagnosis includes primary pancreatic carcinoma and metastatic peripancreatic lymphadenopathy. Consider further evaluation with abdomen MRI without and with contrast or EUS. Incidental small benign left adrenal adenoma. Electronically Signed   By: Earle Gell M.D.   On: 03/09/2017 13:55     Assessment & Plan:   Lung mass LLL lung mass -PET positive with LAN . PET did show pancreatic density . MRI is pending  Once this is return will decide on best step for bx (staging workup )  Discussed this with pt and wife.  Case and scans reviewed with Dr. Halford Chessman    COPD (chronic obstructive pulmonary disease) ([place]) Severe COPD w/ emphysema /asthma component  Would begin BREO 1 puff daily  follow up in 6-8 weeks and As needed         Rexene Edison, NP 03/29/2017

## 2017-03-29 NOTE — Assessment & Plan Note (Signed)
Severe COPD w/ emphysema /asthma component  Would begin BREO 1 puff daily  follow up in 6-8 weeks and As needed

## 2017-03-29 NOTE — Patient Instructions (Signed)
Begin BREO 1 puff daily  Go for MRI as discussed once results return we will call about biopsy .  Continue on Oxygen 2l/m .  Follow up Dr. Halford Chessman  In 6-8 weeks and As needed   Please contact office for sooner follow up if symptoms do not improve or worsen or seek emergency care

## 2017-03-29 NOTE — Progress Notes (Signed)
PFT done today. 

## 2017-03-30 DIAGNOSIS — I959 Hypotension, unspecified: Secondary | ICD-10-CM | POA: Diagnosis not present

## 2017-03-30 DIAGNOSIS — I110 Hypertensive heart disease with heart failure: Secondary | ICD-10-CM | POA: Diagnosis not present

## 2017-03-30 DIAGNOSIS — J45909 Unspecified asthma, uncomplicated: Secondary | ICD-10-CM | POA: Diagnosis not present

## 2017-03-30 DIAGNOSIS — C3490 Malignant neoplasm of unspecified part of unspecified bronchus or lung: Secondary | ICD-10-CM | POA: Diagnosis not present

## 2017-03-30 DIAGNOSIS — D649 Anemia, unspecified: Secondary | ICD-10-CM | POA: Diagnosis not present

## 2017-03-30 DIAGNOSIS — I5032 Chronic diastolic (congestive) heart failure: Secondary | ICD-10-CM | POA: Diagnosis not present

## 2017-03-30 MED ORDER — FLUTICASONE FUROATE-VILANTEROL 200-25 MCG/INH IN AEPB: 1.0000 | INHALATION_SPRAY | Freq: Every day | RESPIRATORY_TRACT | 0 refills | Status: AC

## 2017-03-30 MED ORDER — FLUTICASONE FUROATE-VILANTEROL 200-25 MCG/INH IN AEPB: 1.0000 | INHALATION_SPRAY | Freq: Every day | RESPIRATORY_TRACT | 5 refills | Status: AC

## 2017-03-30 NOTE — Addendum Note (Signed)
Addended by: Parke Poisson E on: 03/30/2017 09:07 AM   Modules accepted: Orders

## 2017-03-30 NOTE — Progress Notes (Signed)
Patient seen in the office today and instructed on use of Breo 200.  Patient expressed understanding and demonstrated technique. Parke Poisson, CMA 03/29/17

## 2017-03-31 ENCOUNTER — Ambulatory Visit (HOSPITAL_COMMUNITY)
Admission: RE | Admit: 2017-03-31 | Discharge: 2017-03-31 | Disposition: A | Discharge status: Home / Self Care | Payer: Medicare Other | Source: Ambulatory Visit | Attending: Pulmonary Disease | Admitting: Pulmonary Disease

## 2017-03-31 ENCOUNTER — Other Ambulatory Visit: Payer: Self-pay | Admitting: Pulmonary Disease

## 2017-03-31 DIAGNOSIS — N329 Bladder disorder, unspecified: Secondary | ICD-10-CM | POA: Diagnosis not present

## 2017-03-31 DIAGNOSIS — R918 Other nonspecific abnormal finding of lung field: Secondary | ICD-10-CM | POA: Diagnosis not present

## 2017-03-31 DIAGNOSIS — K869 Disease of pancreas, unspecified: Secondary | ICD-10-CM | POA: Insufficient documentation

## 2017-03-31 DIAGNOSIS — K8689 Other specified diseases of pancreas: Secondary | ICD-10-CM

## 2017-03-31 DIAGNOSIS — R911 Solitary pulmonary nodule: Secondary | ICD-10-CM | POA: Diagnosis not present

## 2017-03-31 MED ORDER — GADOBENATE DIMEGLUMINE 529 MG/ML IV SOLN
20.0000 mL | Freq: Once | INTRAVENOUS | Status: AC
  Administered 2017-03-31: 20 mL via INTRAVENOUS

## 2017-04-02 NOTE — Progress Notes (Signed)
I have reviewed and agree with assessment/plan.  Chesley Mires, MD [facility] 04/02/2017, 6:46 AM Pager:  [phone]

## 2017-04-03 ENCOUNTER — Ambulatory Visit (HOSPITAL_COMMUNITY): Payer: Medicare Other

## 2017-04-09 ENCOUNTER — Telehealth: Payer: Self-pay | Admitting: Pulmonary Disease

## 2017-04-09 DIAGNOSIS — R948 Abnormal results of function studies of other organs and systems: Secondary | ICD-10-CM

## 2017-04-09 DIAGNOSIS — R911 Solitary pulmonary nodule: Secondary | ICD-10-CM

## 2017-04-09 DIAGNOSIS — I5032 Chronic diastolic (congestive) heart failure: Secondary | ICD-10-CM | POA: Diagnosis not present

## 2017-04-09 DIAGNOSIS — Z9981 Dependence on supplemental oxygen: Secondary | ICD-10-CM | POA: Diagnosis not present

## 2017-04-09 NOTE — Telephone Encounter (Addendum)
Results have been explained to patient sister [name],  expressed understanding.  Okay with moving forward with GI referral and Biopsy with IR.  Orders placed for GI Referral and Lung Biopsy

## 2017-04-09 NOTE — Telephone Encounter (Signed)
LM for patient x 1 

## 2017-04-09 NOTE — Telephone Encounter (Signed)
MR abdomen 03/31/17 >> similar appearance of pancreas head from 12/31/09 but had hypermetabolism on PET scan from 03/09/17    Will have my nurse inform pt that finding on pancreas appears stable since 2011, but had hypermetabolism on recent PET scan.  He needs to have GI evaluation of this >> please arrange for referral to GI.  His nodule in Lt lung is likely different than pancreas lesion.  He needs biopsy of Lt lung lesion.  Please arrange for interventional radiology to arrange for transthoracic biopsy of left lung lesion.

## 2017-04-09 NOTE — Telephone Encounter (Signed)
Pt sister [name] calling to get results of brothers Scan She can be reached @ [phone].Hillery Hunter

## 2017-04-09 NOTE — Telephone Encounter (Signed)
There is phone message from Dr. Halford Chessman  . Is there additional questions?

## 2017-04-13 ENCOUNTER — Ambulatory Visit (INDEPENDENT_AMBULATORY_CARE_PROVIDER_SITE_OTHER): Payer: Medicare Other | Admitting: Adult Health

## 2017-04-13 ENCOUNTER — Encounter: Payer: Self-pay | Admitting: Adult Health

## 2017-04-13 VITALS — BP 115/67 | HR 95 | Ht 66.0 in | Wt 223.0 lb

## 2017-04-13 DIAGNOSIS — I951 Orthostatic hypotension: Secondary | ICD-10-CM | POA: Diagnosis not present

## 2017-04-13 DIAGNOSIS — R918 Other nonspecific abnormal finding of lung field: Secondary | ICD-10-CM | POA: Diagnosis not present

## 2017-04-13 DIAGNOSIS — R600 Localized edema: Secondary | ICD-10-CM

## 2017-04-13 DIAGNOSIS — I5032 Chronic diastolic (congestive) heart failure: Secondary | ICD-10-CM | POA: Diagnosis not present

## 2017-04-13 MED ORDER — POTASSIUM CHLORIDE CRYS ER 20 MEQ PO TBCR: 20.0000 meq | EXTENDED_RELEASE_TABLET | Freq: Every day | ORAL | 6 refills | Status: DC

## 2017-04-13 MED ORDER — METOLAZONE 2.5 MG PO TABS: 2.5000 mg | ORAL_TABLET | Freq: Every day | ORAL | 6 refills | Status: DC

## 2017-04-13 MED ORDER — FUROSEMIDE 40 MG PO TABS: 120.0000 mg | ORAL_TABLET | Freq: Every day | ORAL | 6 refills | Status: DC

## 2017-04-13 NOTE — Patient Instructions (Addendum)
Medication Instructions:   Your physician recommends that you continue on your current medications as directed. Please refer to the Current Medication list given to you today.  Potassium 20 meq by mouth daily prescription was sent to your pharmacy.  Labwork:  NONE  Testing/Procedures:  NONE  Follow-Up:  Your physician recommends that you schedule a follow-up appointment in: 4-6 months. You will receive a reminder letter in the mail in about 2-4 months reminding you to call and schedule your appointment. If you don't receive this letter, please contact our office.   Any Other Special Instructions Will Be Listed Below (If Applicable).  Refills have been sent to your pharmacy [facility]) today for furosemide 40 mg and metolazone 2.5 mg.  If you need a refill on your cardiac medications before your next appointment, please call your pharmacy.

## 2017-04-13 NOTE — Progress Notes (Signed)
Cardiology Office Note   Date:  04/13/2017   ID:  James, Zamora 1956/07/07, MRN [id number]  PCP:  [name], MD  Cardiologist: Harrington Challenger  Chief Complaint  Patient presents with  . Congestive Heart Failure      History of Present Illness: James Zamora is a 61 y.o. male who presents for ongoing assessment and management of chronic diastolic heart failure, with known history of chronic lower extremity edema, oxygen dependence, recent lung mass concerning for cancer, who was last seen in the office on 03/16/2017 by Melina Copa (see her lengthy note). During that office visit the patient was diagnosed with orthostatic hypotension requiring cessation of multiple antihypertensives. Lower extremity edema with thought to be related to venous insufficiency and diuretics to be given when necessary. Marland Kitchen He was placed on a sodium restricted diet. Compression hose were recommended. He was to follow-up with pulmonary.  He comes today without complaints. He is anxious about lung biopsy next week. He is medically complaint. He has been taking his sister's potassium.  Past Medical History:  Diagnosis Date  . Asthma   . Chronic diastolic CHF (congestive heart failure) ([place])   . Lung mass   . Obesity   . Perforated bowel [facility])     Past Surgical History:  Procedure Laterality Date  . APPENDECTOMY    . CHOLECYSTECTOMY    . COLONOSCOPY N/A 07/31/2016   Procedure: COLONOSCOPY;  Surgeon: Danie Binder, MD;  Location: [facility];  Service: Endoscopy;  Laterality: N/A;  1:45 pm  . ESOPHAGOGASTRODUODENOSCOPY N/A 07/31/2016   Procedure: ESOPHAGOGASTRODUODENOSCOPY (EGD);  Surgeon: Danie Binder, MD;  Location: [facility] SU[facility];  Service: Endoscopy;  Laterality: N/A;  . HERNIA REPAIR       Current Outpatient Prescriptions  Medication Sig Dispense Refill  . aspirin EC 81 MG EC tablet Take 1 tablet (81 mg total) by mouth daily. 30 tablet 1  . fluticasone furoate-vilanterol (BREO ELLIPTA) 200-25  MCG/INH AEPB Inhale 1 puff into the lungs daily. 1 each 5  . furosemide (LASIX) 40 MG tablet Take 120 mg by mouth daily.     . metolazone (ZAROXOLYN) 2.5 MG tablet Take 1 tablet by mouth daily.     No current facility-administered medications for this visit.     Allergies:   Nutritional supplements and Penicillins    Social History:  The patient  reports that he quit smoking about 4 years ago. His smoking use included Cigarettes. He has a 20.00 pack-year smoking history. He quit smokeless tobacco use about 2 years ago. His smokeless tobacco use included Chew. He reports that he does not drink alcohol or use drugs.   Family History:  The patient's family history includes Asthma in his father; Diabetes in his mother.    ROS: All other systems are reviewed and negative. Unless otherwise mentioned in H&P    PHYSICAL EXAM: VS:  BP 115/67   Pulse 95   Ht 5\' 6"  (1.676 m)   Wt 223 lb (101.2 kg)   SpO2 91% Comment: on 3L O2 via [place]  BMI 35.99 kg/m  , BMI Body mass index is 35.99 kg/m. GEN: Well nourished, well developed, in no acute distress Obese HEENT: normal  Neck: no JVD, carotid bruits, or masses Cardiac: RRR; no murmurs, rubs, or gallops,no edema  Respiratory:  Mild bibasilar crackles. No wheezes. Wearing O2 via [place] GI: soft, nontender, nondistended, + BS MS: no deformity or atrophy Significant LEE, with skin thickening.  Skin: warm and  dry, no rash Neuro:  Strength and sensation are intact Psych: euthymic mood, full affect   Recent Labs: 06/26/2016: TSH 0.58 03/08/2017: ALT 21; B Natriuretic Peptide 19.0; BUN 25; Creatinine, Ser 1.19; Hemoglobin 13.0; Platelets 334; Potassium 4.6; Sodium 138     Wt Readings from Last 3 Encounters:  04/13/17 223 lb (101.2 kg)  03/29/17 234 lb 6.4 oz (106.3 kg)  03/16/17 224 lb (101.6 kg)      Other studies Reviewed: Echocardiogram Jun 19, 2016 Left ventricle: The cavity size was normal. Wall thickness was   increased in a pattern of  moderate LVH. Systolic function was   normal. The estimated ejection fraction was in the range of 60%   to 65%. Wall motion was normal; there were no regional wall   motion abnormalities. Left ventricular diastolic function   parameters were normal. - Aortic valve: Valve area (VTI): 2.72 cm^2. Valve area (Vmax):   2.72 cm^2. Valve area (Vmean): 2.71 cm^2. - Right ventricle: The cavity size was mildly dilated. - Atrial septum: No defect or patent foramen ovale was identified.  PET Image 03/09/2017 IMPRESSION: 3.7 cm hypermetabolic left lower lobe lung mass, consistent with primary bronchogenic carcinoma.  12 mm hypermetabolic mediastinal lymph node in the [facility] window, suspicious for lymph node metastasis.  2.7 cm hypermetabolic soft tissue density in pancreatic head. Differential diagnosis includes primary pancreatic carcinoma and metastatic peripancreatic lymphadenopathy. Consider further evaluation with abdomen MRI without and with contrast or EUS.  Incidental small benign left adrenal adenoma.  ASSESSMENT AND PLAN:  1. Chronic Diastolic CHF: He is remains at baseline. No significant weight gain, worsening LEE, or abdominal distention. He will continue current diuretic regimen. He does not tolerate antihypertensive regimen other than diuretics, due to orthostatic hypotension. rx for potassium 20 mEq is prescribed. Last potassium 4.6.   2. Lung Mass with O2 dependence: He is due for CT guided lung biopsy next week. He is anxious about this.   3. Chronic LEE: He does not tolerate compression hose. Venous insufficiency and skin thickening are noted and chronic.    Current medicines are reviewed at length with the patient today.    Labs/ tests ordered today include:  [name]. [name], ANP, AACC   04/13/2017 1:46 PM    [facility] [address] [place], [place] [address] Phone: [phone]; Fax: [phone]

## 2017-04-16 ENCOUNTER — Encounter: Payer: Self-pay | Admitting: Gastroenterology

## 2017-04-17 ENCOUNTER — Telehealth (HOSPITAL_COMMUNITY): Payer: Self-pay

## 2017-04-17 ENCOUNTER — Other Ambulatory Visit: Payer: Self-pay | Admitting: General Surgery

## 2017-04-17 NOTE — Telephone Encounter (Signed)
Called to reschedule lung biopsy, left message for pt to return call. AW

## 2017-04-18 ENCOUNTER — Ambulatory Visit (HOSPITAL_COMMUNITY): Admission: RE | Admit: 2017-04-18 | Payer: Medicare Other | Source: Ambulatory Visit

## 2017-04-21 ENCOUNTER — Other Ambulatory Visit: Payer: Self-pay | Admitting: Radiology

## 2017-04-23 ENCOUNTER — Other Ambulatory Visit: Payer: Self-pay | Admitting: Student

## 2017-04-23 ENCOUNTER — Other Ambulatory Visit: Payer: Self-pay | Admitting: General Surgery

## 2017-04-24 ENCOUNTER — Ambulatory Visit (HOSPITAL_COMMUNITY)
Admission: RE | Admit: 2017-04-24 | Discharge: 2017-04-24 | Disposition: A | Discharge status: Home / Self Care | Payer: Medicare Other | Source: Ambulatory Visit | Attending: Interventional Radiology | Admitting: Interventional Radiology

## 2017-04-24 ENCOUNTER — Ambulatory Visit (HOSPITAL_COMMUNITY)
Admission: RE | Admit: 2017-04-24 | Discharge: 2017-04-24 | Disposition: A | Discharge status: Home / Self Care | Payer: Medicare Other | Source: Ambulatory Visit | Attending: Pulmonary Disease | Admitting: Pulmonary Disease

## 2017-04-24 ENCOUNTER — Encounter (HOSPITAL_COMMUNITY): Payer: Self-pay

## 2017-04-24 DIAGNOSIS — Z6835 Body mass index (BMI) 35.0-35.9, adult: Secondary | ICD-10-CM | POA: Insufficient documentation

## 2017-04-24 DIAGNOSIS — C3432 Malignant neoplasm of lower lobe, left bronchus or lung: Secondary | ICD-10-CM | POA: Diagnosis not present

## 2017-04-24 DIAGNOSIS — Z79899 Other long term (current) drug therapy: Secondary | ICD-10-CM | POA: Insufficient documentation

## 2017-04-24 DIAGNOSIS — J449 Chronic obstructive pulmonary disease, unspecified: Secondary | ICD-10-CM | POA: Insufficient documentation

## 2017-04-24 DIAGNOSIS — E669 Obesity, unspecified: Secondary | ICD-10-CM | POA: Insufficient documentation

## 2017-04-24 DIAGNOSIS — R109 Unspecified abdominal pain: Secondary | ICD-10-CM | POA: Diagnosis not present

## 2017-04-24 DIAGNOSIS — R911 Solitary pulmonary nodule: Secondary | ICD-10-CM

## 2017-04-24 DIAGNOSIS — Z880 Allergy status to penicillin: Secondary | ICD-10-CM | POA: Diagnosis not present

## 2017-04-24 DIAGNOSIS — K869 Disease of pancreas, unspecified: Secondary | ICD-10-CM | POA: Diagnosis not present

## 2017-04-24 DIAGNOSIS — I5032 Chronic diastolic (congestive) heart failure: Secondary | ICD-10-CM | POA: Diagnosis not present

## 2017-04-24 DIAGNOSIS — Z7982 Long term (current) use of aspirin: Secondary | ICD-10-CM | POA: Insufficient documentation

## 2017-04-24 DIAGNOSIS — R112 Nausea with vomiting, unspecified: Secondary | ICD-10-CM | POA: Diagnosis not present

## 2017-04-24 DIAGNOSIS — R918 Other nonspecific abnormal finding of lung field: Secondary | ICD-10-CM | POA: Diagnosis not present

## 2017-04-24 DIAGNOSIS — Z9049 Acquired absence of other specified parts of digestive tract: Secondary | ICD-10-CM | POA: Insufficient documentation

## 2017-04-24 LAB — CBC
HCT: 40.5 % (ref 39.0–52.0)
Hemoglobin: 12.4 g/dL — ABNORMAL LOW (ref 13.0–17.0)
MCH: 25.8 pg — ABNORMAL LOW (ref 26.0–34.0)
MCHC: 30.6 g/dL (ref 30.0–36.0)
MCV: 84.2 fL (ref 78.0–100.0)
PLATELETS: 272 10*3/uL (ref 150–400)
RBC: 4.81 MIL/uL (ref 4.22–5.81)
RDW: 15.1 % (ref 11.5–15.5)
WBC: 6.1 10*3/uL (ref 4.0–10.5)

## 2017-04-24 LAB — APTT: APTT: 26 s (ref 24–36)

## 2017-04-24 LAB — PROTIME-INR
INR: 1.03
PROTHROMBIN TIME: 13.6 s (ref 11.4–15.2)

## 2017-04-24 MED ORDER — FENTANYL CITRATE (PF) 100 MCG/2ML IJ SOLN
INTRAMUSCULAR | Status: AC
  Filled 2017-04-24: qty 4

## 2017-04-24 MED ORDER — MIDAZOLAM HCL 2 MG/2ML IJ SOLN
INTRAMUSCULAR | Status: AC
  Filled 2017-04-24: qty 4

## 2017-04-24 MED ORDER — FENTANYL CITRATE (PF) 100 MCG/2ML IJ SOLN
INTRAMUSCULAR | Status: AC | PRN
  Administered 2017-04-24: 25 ug via INTRAVENOUS

## 2017-04-24 MED ORDER — MIDAZOLAM HCL 2 MG/2ML IJ SOLN
INTRAMUSCULAR | Status: AC | PRN
  Administered 2017-04-24: 1 mg via INTRAVENOUS

## 2017-04-24 MED ORDER — SODIUM CHLORIDE 0.9 % IV SOLN
INTRAVENOUS | Status: AC | PRN
  Administered 2017-04-24: 10 mL/h via INTRAVENOUS

## 2017-04-24 MED ORDER — LIDOCAINE HCL (PF) 1 % IJ SOLN
INTRAMUSCULAR | Status: AC
  Filled 2017-04-24: qty 30

## 2017-04-24 MED ORDER — SODIUM CHLORIDE 0.9 % IV SOLN: INTRAVENOUS | Status: DC

## 2017-04-24 NOTE — H&P (Signed)
Chief Complaint: Patient was seen in consultation today for left lung mass biopsy at the request of Sood,Vineet  Referring Physician(s): Sood,Vineet  Supervising Physician: Daryll Brod  Patient Status: [facility] - Out-pt  History of Present Illness: James Zamora is a 61 y.o. male   Hx COPD; CHF Was seen in ED 01/2017 for abd pain; N/V Work up included CT IMPRESSION: 1. Superior segment left lower lobe mass measuring 3.7 x 3.6 x 3.9 cm. Associated left lower paratracheal lymphadenopathy measuring to 13 mm short axis. Findings are concerning for primary bronchogenic carcinoma with possible mediastinal lymphadenopathy/metastasis.  Further work up: 03/31/17: PET: IMPRESSION: 3.7 cm hypermetabolic left lower lobe lung mass, consistent with primary bronchogenic carcinoma. 12 mm hypermetabolic mediastinal lymph node in the [facility] window, suspicious for lymph node metastasis. 2.7 cm hypermetabolic soft tissue density in pancreatic head. Differential diagnosis includes primary pancreatic carcinoma and metastatic peripancreatic lymphadenopathy.   MR Abd  IMPRESSION: 1. Large left lower lobe pulmonary mass. 2. Soft tissue fullness identified in the head of the pancreas on recent PET-CT appears similar to prior abdomen and pelvis CT scans dating back to 12/31/2009. No definite mass identified at this location on today's MRI although assessment is limited by inability to obtain dynamic postcontrast imaging secondary to patient illness.  Request for lung mass biopsy per Dr Halford Chessman Scheduled today for same  Past Medical History:  Diagnosis Date  . Asthma   . Chronic diastolic CHF (congestive heart failure) ([place])   . Lung mass   . Obesity   . Perforated bowel [facility])     Past Surgical History:  Procedure Laterality Date  . APPENDECTOMY    . CHOLECYSTECTOMY    . COLONOSCOPY N/A 07/31/2016   Procedure: COLONOSCOPY;  Surgeon: Danie Binder, MD;  Location[facility];  Service:  Endoscopy;  Laterality: N/A;  1:45 pm  . ESOPHAGOGASTRODUODENOSCOPY N/A 07/31/2016   Procedure: ESOPHAGOGASTRODUODENOSCOPY (EGD);  Surgeon: Danie Binder, MD;  Location: [facility];  Service: Endoscopy;  Laterality: N/A;  . HERNIA REPAIR      Allergies: Nutritional supplements and Penicillins  Medications: Prior to Admission medications   Medication Sig Start Date End Date Taking? Authorizing Provider  aspirin EC 81 MG EC tablet Take 1 tablet (81 mg total) by mouth daily. 06/12/16  Yes Kathie Dike, MD  fluticasone furoate-vilanterol (BREO ELLIPTA) 200-25 MCG/INH AEPB Inhale 1 puff into the lungs daily. 03/30/17  Yes Parrett, Tammy S, NP  furosemide (LASIX) 40 MG tablet Take 3 tablets (120 mg total) by mouth daily. Patient taking differently: Take 40 mg by mouth daily.  04/13/17  Yes Lendon Colonel, NP  OXYGEN Inhale 3 L into the lungs continuous.   Yes [provider]  potassium chloride SA (KLOR-CON M20) 20 MEQ tablet Take 1 tablet (20 mEq total) by mouth daily. 04/13/17 07/12/17 Yes Lendon Colonel, NP     Family History  Problem Relation Age of Onset  . Diabetes Mother   . Asthma Father   . Colon cancer Neg Hx     Social History   Social History  . Marital status: Single    Spouse name: N/A  . Number of children: N/A  . Years of education: N/A   Social History Main Topics  . Smoking status: Former Smoker    Packs/day: 2.00    Years: 10.00    Types: Cigarettes    Quit date: 07/06/2012  . Smokeless tobacco: Former Systems developer    Types: Loss adjuster, chartered  Quit date: 07/06/2014  . Alcohol use No  . Drug use: No  . Sexual activity: Not Asked   Other Topics Concern  . None   Social History Narrative  . None    Review of Systems: A 12 point ROS discussed and pertinent positives are indicated in the HPI above.  All other systems are negative.  Review of Systems  Constitutional: Positive for fatigue. Negative for activity change, fever and unexpected weight change.    Respiratory: Positive for cough and shortness of breath.   Gastrointestinal: Negative for abdominal pain.  Neurological: Negative for weakness.  Psychiatric/Behavioral: Negative for behavioral problems and confusion.    Vital Signs: BP 118/90   Pulse 95   Temp 98.3 F (36.8 C)   Resp 20   Ht 5\' 6"  (1.676 m)   Wt 223 lb (101.2 kg)   SpO2 98%   BMI 35.99 kg/m   Physical Exam  Constitutional: He is oriented to person, place, and time.  Cardiovascular: Normal rate and regular rhythm.   Pulmonary/Chest: Effort normal and breath sounds normal.  Abdominal: Soft. Bowel sounds are normal.  Musculoskeletal: Normal range of motion.  Neurological: He is alert and oriented to person, place, and time.  Skin: Skin is warm and dry.  Psychiatric: He has a normal mood and affect. His behavior is normal. Judgment and thought content normal.  Nursing note and vitals reviewed.   Mallampati Score:  MD Evaluation Airway: WNL Heart: WNL Abdomen: WNL Chest/ Lungs: WNL ASA  Classification: 3 Mallampati/Airway Score: Two  Imaging: Mr 3d Recon At Scanner  Result Date: 03/31/2017 CLINICAL DATA:  Possible pancreatic head mass seen on PET-CT. EXAM: MRI ABDOMEN WITHOUT AND WITH CONTRAST (INCLUDING MRCP) TECHNIQUE: Multiplanar multisequence MR imaging of the abdomen was performed both before and after the administration of intravenous contrast. Heavily T2-weighted images of the biliary and pancreatic ducts were obtained, and three-dimensional MRCP images were rendered by post processing. CONTRAST:  64mL MULTIHANCE GADOBENATE DIMEGLUMINE 529 MG/ML IV SOLN COMPARISON:  PET-CT 03/09/2017 FINDINGS: Patient became ill after receiving intravenous contrast material. As such, dynamic postcontrast imaging was not obtained. Lower chest: Left lower lobe mass identified, better seen on previous CT imaging. Hepatobiliary: Unremarkable. Gallbladder surgically absent. No intrahepatic or extrahepatic biliary dilation.  Pancreas: Evaluation the pancreas limited by the lack of dynamic imaging. No dilatation of the main duct. As on the previous PET scan there does appear to be some fullness in the region of the inferior pancreatic head. Comparing back to CT scans from 06/07/2016 and 12/31/2009 the appearance is similar. Spleen:  Spleen unremarkable. Adrenals/Urinary Tract: No adrenal nodule or mass. Tiny cysts noted lower pole left kidney. Stomach/Bowel: Stomach is nondistended. No gastric wall thickening. No evidence of outlet obstruction. Duodenum is normally positioned as is the ligament of Treitz. There may be a tiny duodenal diverticulum in the region of the pancreatic head. Vascular/Lymphatic: No abdominal aortic aneurysm no abdominal lymphadenopathy Other:  No intraperitoneal free fluid. Musculoskeletal: No abnormal marrow signal within the visualized bony anatomy. IMPRESSION: 1. Large left lower lobe pulmonary mass. 2. Soft tissue fullness identified in the head of the pancreas on recent PET-CT appears similar to prior abdomen and pelvis CT scans dating back to 12/31/2009. No definite mass identified at this location on today's MRI although assessment is limited by inability to obtain dynamic postcontrast imaging secondary to patient illness. Given the demonstrated hypermetabolism in this region, further workup may be indicated. Endoscopic ultrasound would likely prove helpful. Electronically Signed  By: Misty Stanley M.D.   On: 03/31/2017 16:22   Mr Abdomen Mrcp Moise Boring Contast  Result Date: 03/31/2017 CLINICAL DATA:  Possible pancreatic head mass seen on PET-CT. EXAM: MRI ABDOMEN WITHOUT AND WITH CONTRAST (INCLUDING MRCP) TECHNIQUE: Multiplanar multisequence MR imaging of the abdomen was performed both before and after the administration of intravenous contrast. Heavily T2-weighted images of the biliary and pancreatic ducts were obtained, and three-dimensional MRCP images were rendered by post processing. CONTRAST:  81mL  MULTIHANCE GADOBENATE DIMEGLUMINE 529 MG/ML IV SOLN COMPARISON:  PET-CT 03/09/2017 FINDINGS: Patient became ill after receiving intravenous contrast material. As such, dynamic postcontrast imaging was not obtained. Lower chest: Left lower lobe mass identified, better seen on previous CT imaging. Hepatobiliary: Unremarkable. Gallbladder surgically absent. No intrahepatic or extrahepatic biliary dilation. Pancreas: Evaluation the pancreas limited by the lack of dynamic imaging. No dilatation of the main duct. As on the previous PET scan there does appear to be some fullness in the region of the inferior pancreatic head. Comparing back to CT scans from 06/07/2016 and 12/31/2009 the appearance is similar. Spleen:  Spleen unremarkable. Adrenals/Urinary Tract: No adrenal nodule or mass. Tiny cysts noted lower pole left kidney. Stomach/Bowel: Stomach is nondistended. No gastric wall thickening. No evidence of outlet obstruction. Duodenum is normally positioned as is the ligament of Treitz. There may be a tiny duodenal diverticulum in the region of the pancreatic head. Vascular/Lymphatic: No abdominal aortic aneurysm no abdominal lymphadenopathy Other:  No intraperitoneal free fluid. Musculoskeletal: No abnormal marrow signal within the visualized bony anatomy. IMPRESSION: 1. Large left lower lobe pulmonary mass. 2. Soft tissue fullness identified in the head of the pancreas on recent PET-CT appears similar to prior abdomen and pelvis CT scans dating back to 12/31/2009. No definite mass identified at this location on today's MRI although assessment is limited by inability to obtain dynamic postcontrast imaging secondary to patient illness. Given the demonstrated hypermetabolism in this region, further workup may be indicated. Endoscopic ultrasound would likely prove helpful. Electronically Signed   By: Misty Stanley M.D.   On: 03/31/2017 16:22    Labs:  CBC:  Recent Labs  11/27/16 1352 02/20/17 2323 03/08/17 1851  04/24/17 0655  WBC 5.7 8.5 5.6 6.1  HGB 13.4 14.2 13.0 12.4*  HCT 43.4 43.6 40.2 40.5  PLT 308 277 334 272    COAGS: No results for input(s): INR, APTT in the last 8760 hours.  BMP:  Recent Labs  06/10/16 0543 06/11/16 0554  11/30/16 1448 01/10/17 1545 02/20/17 2323 03/08/17 1851  NA 130* 133*  < > 140 140 135 138  K 3.6 3.8  < > 4.3 3.9 4.2 4.6  CL 92* 93*  < > 102 99 94* 100*  CO2 32 34*  < > 32* 34* 30 31  GLUCOSE 82 91  < > 97 111* 141* 87  BUN 20 15  < > 10 13 39* 25*  CALCIUM 8.0* 8.1*  < > 9.0 9.3 9.5 9.2  CREATININE 1.41* 1.14  < > 1.13 1.21 1.57* 1.19  GFRNONAA 53* >60  --   --   --  46* >60  GFRAA >60 >60  --   --   --  54* >60  < > = values in this interval not displayed.  LIVER FUNCTION TESTS:  Recent Labs  06/07/16 0415 06/26/16 1450 02/20/17 2323 03/08/17 1851  BILITOT 0.3 0.3 0.7 0.5  AST 13* 14 18 18   ALT 8* 9 12* 21  ALKPHOS 77  72 94 70  PROT 8.7* 7.8 8.7* 7.6  ALBUMIN 3.3* 3.6 4.1 3.5    TUMOR MARKERS: No results for input(s): AFPTM, CEA, CA199, CHROMGRNA in the last 8760 hours.  Assessment and Plan:  Left lung mass +PET Pancreatic head mass + LAN Scheduled for lung mass biopsy today Risks and Benefits discussed with the patient including, but not limited to bleeding, hemoptysis, respiratory failure requiring intubation, infection, pneumothorax requiring chest tube placement, stroke from air embolism or even death. All of the patient's questions were answered, patient is agreeable to proceed. Consent signed and in chart.  Thank you for this interesting consult.  I greatly enjoyed meeting GORDEN STTHOMAS and look forward to participating in their care.  A copy of this report was sent to the requesting provider on this date.  Electronically Signed: Lavonia Drafts, PA-C 04/24/2017, 7:31 AM   I spent a total of  30 Minutes   in face to face in clinical consultation, greater than 50% of which was counseling/coordinating care for  left lung mass bx

## 2017-04-24 NOTE — Procedures (Signed)
Lung mass  S/P CT BX LLL MASS  NO COMP STABLE PATH PENDING FULL REPORT IN PACS

## 2017-04-24 NOTE — Discharge Instructions (Addendum)
Needle Biopsy of the Lung, Care After °This sheet gives you information about how to care for yourself after your procedure. Your health care provider may also give you more specific instructions. If you have problems or questions, contact your health care provider. °What can I expect after the procedure? °After the procedure, it is common to have: °· Soreness, pain, and tenderness where a tissue sample was taken (biopsy site). °· A cough. °· A sore throat. ° °Follow these instructions at home: °Biopsy site care °· Follow instructions from your health care provider about when to remove the bandage that was placed on the biopsy site. °· Keep the bandage dry until it has been removed. °· Check your biopsy site every day for signs of infection. Check for: °? More redness, swelling, or pain. °? More fluid or blood. °? Warmth to the touch. °? Pus or a bad smell. °General instructions °· Rest as directed by your health care provider. Ask your health care provider what activities are safe for you. °· Do not take baths, swim, or use a hot tub until your health care provider approves. °· Take over-the-counter and prescription medicines only as told by your health care provider. °· If you have airplane travel scheduled, talk with your health care provider about when it is safe for you to travel by airplane. °· It is up to you to get the results of your procedure. Ask your health care provider, or the department that is doing the procedure, when your results will be ready. °· Keep all follow-up visits as told by your health care provider. This is important. °Contact a health care provider if: °· You have more redness, swelling, or pain around your biopsy site. °· You have more fluid or blood coming from your biopsy site. °· Your biopsy site feels warm to the touch. °· You have pus or a bad smell coming from your biopsy site. °· You have a fever. °· You have pain that does not get better with medicine. °Get help right away  if: °· You have problems breathing. °· You have chest pain. °· You cough up blood. °· You faint. °· You have a fast heart rate. °Summary °· After a needle biopsy of the lung, it is common to have a cough, a sore throat, or soreness, pain, and tenderness where a tissue sample was taken (biopsy site). °· You should check your biopsy area every day for signs of infection, including pus or a bad smell, warmth, more fluid or blood, or more redness, swelling, or pain. °· You should not take baths, swim, or use a hot tub until your health care provider approves. °· It is up to you to get the results of your procedure. Ask your health care provider, or the department that is doing the procedure, when your results will be ready. °This information is not intended to replace advice given to you by your health care provider. Make sure you discuss any questions you have with your health care provider. °Document Released: 08/13/2007 Document Revised: 09/06/2016 Document Reviewed: 09/06/2016 °Elsevier Interactive Patient Education © 2017 [employer] Inc[employer] ° °Moderate Conscious Sedation, Adult, Care After °These instructions provide you with information about caring for yourself after your procedure. Your health care provider may also give you more specific instructions. Your treatment has been planned according to current medical practices, but problems sometimes occur. Call your health care provider if you have any problems or questions after your procedure. °What can I expect after the   procedure? °After your procedure, it is common: °· To feel sleepy for several hours. °· To feel clumsy and have poor balance for several hours. °· To have poor judgment for several hours. °· To vomit if you eat too soon. ° °Follow these instructions at home: °For at least 24 hours after the procedure: ° °· Do not: °? Participate in activities where you could fall or become injured. °? Drive. °? Use heavy machinery. °? Drink alcohol. °? Take sleeping  pills or medicines that cause drowsiness. °? Make important decisions or sign legal documents. °? Take care of children on your own. °· Rest. °Eating and drinking °· Follow the diet recommended by your health care provider. °· If you vomit: °? Drink water, juice, or soup when you can drink without vomiting. °? Make sure you have little or no nausea before eating solid foods. °General instructions °· Have a responsible adult stay with you until you are awake and alert. °· Take over-the-counter and prescription medicines only as told by your health care provider. °· If you smoke, do not smoke without supervision. °· Keep all follow-up visits as told by your health care provider. This is important. °Contact a health care provider if: °· You keep feeling nauseous or you keep vomiting. °· You feel light-headed. °· You develop a rash. °· You have a fever. °Get help right away if: °· You have trouble breathing. °This information is not intended to replace advice given to you by your health care provider. Make sure you discuss any questions you have with your health care provider. °Document Released: 08/06/2013 Document Revised: 03/20/2016 Document Reviewed: 02/05/2016 °Elsevier Interactive Patient Education © 2018 Elsevier Inc. ° °

## 2017-04-24 NOTE — Progress Notes (Signed)
Portable CXR done in room.

## 2017-05-03 ENCOUNTER — Other Ambulatory Visit: Payer: Self-pay

## 2017-05-03 DIAGNOSIS — D649 Anemia, unspecified: Secondary | ICD-10-CM

## 2017-05-04 ENCOUNTER — Telehealth: Payer: Self-pay | Admitting: Pulmonary Disease

## 2017-05-04 ENCOUNTER — Ambulatory Visit: Payer: Medicare Other | Admitting: Adult Health

## 2017-05-04 DIAGNOSIS — C3492 Malignant neoplasm of unspecified part of left bronchus or lung: Secondary | ICD-10-CM

## 2017-05-04 NOTE — Telephone Encounter (Signed)
Lt lower lobe ung biopsy 04/24/17 >> NSCLC (adenocarcinoma)   Left message for patient to call back to discuss results.  He will need referral to oncology.

## 2017-05-07 ENCOUNTER — Encounter: Payer: Self-pay | Admitting: Pulmonary Disease

## 2017-05-07 NOTE — Telephone Encounter (Signed)
Spoke with pt's sister.  Detailed results of lung biopsy. Will arrange for referral to [place] to discuss options of chemo and/or XRT.

## 2017-05-08 ENCOUNTER — Telehealth: Payer: Self-pay | Admitting: *Deleted

## 2017-05-08 DIAGNOSIS — C3492 Malignant neoplasm of unspecified part of left bronchus or lung: Secondary | ICD-10-CM

## 2017-05-08 NOTE — Telephone Encounter (Signed)
Oncology Nurse Navigator Documentation  Oncology Nurse Navigator Flowsheets 05/08/2017  Navigator Location [facility]  Referral date to RadOnc/MedOnc 05/08/2017  Navigator Encounter Type Telephone/I received referral on James Zamora. I called and schedule.    Telephone Outgoing Call  Treatment Phase Pre-Tx/Tx Discussion  Barriers/Navigation Needs Coordination of Care  Interventions Coordination of Care  Coordination of Care Appts  Acuity Level 1  Time Spent with Patient 15

## 2017-05-10 ENCOUNTER — Encounter: Payer: Self-pay | Admitting: Adult Health

## 2017-05-10 ENCOUNTER — Ambulatory Visit (INDEPENDENT_AMBULATORY_CARE_PROVIDER_SITE_OTHER): Payer: Medicare Other | Admitting: Adult Health

## 2017-05-10 ENCOUNTER — Ambulatory Visit (HOSPITAL_BASED_OUTPATIENT_CLINIC_OR_DEPARTMENT_OTHER): Payer: Medicare Other | Admitting: Internal Medicine

## 2017-05-10 ENCOUNTER — Ambulatory Visit: Payer: Medicare Other | Attending: Internal Medicine | Admitting: Physical Therapy

## 2017-05-10 ENCOUNTER — Ambulatory Visit
Admission: RE | Admit: 2017-05-10 | Discharge: 2017-05-10 | Disposition: A | Discharge status: Home / Self Care | Payer: Medicare Other | Source: Ambulatory Visit | Attending: Radiation Oncology | Admitting: Radiation Oncology

## 2017-05-10 ENCOUNTER — Encounter: Payer: Self-pay | Admitting: Internal Medicine

## 2017-05-10 DIAGNOSIS — R2689 Other abnormalities of gait and mobility: Secondary | ICD-10-CM | POA: Diagnosis not present

## 2017-05-10 DIAGNOSIS — J449 Chronic obstructive pulmonary disease, unspecified: Secondary | ICD-10-CM

## 2017-05-10 DIAGNOSIS — K8689 Other specified diseases of pancreas: Secondary | ICD-10-CM | POA: Diagnosis not present

## 2017-05-10 DIAGNOSIS — Z9181 History of falling: Secondary | ICD-10-CM | POA: Diagnosis not present

## 2017-05-10 DIAGNOSIS — Z8042 Family history of malignant neoplasm of prostate: Secondary | ICD-10-CM

## 2017-05-10 DIAGNOSIS — Z833 Family history of diabetes mellitus: Secondary | ICD-10-CM

## 2017-05-10 DIAGNOSIS — Z9981 Dependence on supplemental oxygen: Secondary | ICD-10-CM | POA: Diagnosis not present

## 2017-05-10 DIAGNOSIS — C3432 Malignant neoplasm of lower lobe, left bronchus or lung: Secondary | ICD-10-CM | POA: Diagnosis not present

## 2017-05-10 DIAGNOSIS — J439 Emphysema, unspecified: Secondary | ICD-10-CM | POA: Diagnosis not present

## 2017-05-10 DIAGNOSIS — Z87891 Personal history of nicotine dependence: Secondary | ICD-10-CM | POA: Diagnosis not present

## 2017-05-10 DIAGNOSIS — C3492 Malignant neoplasm of unspecified part of left bronchus or lung: Secondary | ICD-10-CM

## 2017-05-10 DIAGNOSIS — Z9049 Acquired absence of other specified parts of digestive tract: Secondary | ICD-10-CM | POA: Insufficient documentation

## 2017-05-10 DIAGNOSIS — E669 Obesity, unspecified: Secondary | ICD-10-CM | POA: Insufficient documentation

## 2017-05-10 DIAGNOSIS — Z9889 Other specified postprocedural states: Secondary | ICD-10-CM | POA: Insufficient documentation

## 2017-05-10 DIAGNOSIS — I5032 Chronic diastolic (congestive) heart failure: Secondary | ICD-10-CM | POA: Insufficient documentation

## 2017-05-10 DIAGNOSIS — Z7982 Long term (current) use of aspirin: Secondary | ICD-10-CM | POA: Insufficient documentation

## 2017-05-10 DIAGNOSIS — Z510 Encounter for antineoplastic radiation therapy: Secondary | ICD-10-CM | POA: Insufficient documentation

## 2017-05-10 DIAGNOSIS — Z79899 Other long term (current) drug therapy: Secondary | ICD-10-CM | POA: Insufficient documentation

## 2017-05-10 DIAGNOSIS — Z91030 Bee allergy status: Secondary | ICD-10-CM | POA: Insufficient documentation

## 2017-05-10 DIAGNOSIS — Z880 Allergy status to penicillin: Secondary | ICD-10-CM | POA: Insufficient documentation

## 2017-05-10 HISTORY — DX: Malignant neoplasm of unspecified part of left bronchus or lung: C34.92

## 2017-05-10 MED ORDER — FLUTICASONE FUROATE-VILANTEROL 200-25 MCG/INH IN AEPB: 1.0000 | INHALATION_SPRAY | Freq: Every day | RESPIRATORY_TRACT | 0 refills | Status: AC

## 2017-05-10 MED ORDER — PROCHLORPERAZINE MALEATE 10 MG PO TABS: 10.0000 mg | ORAL_TABLET | Freq: Four times a day (QID) | ORAL | 0 refills | Status: DC | PRN

## 2017-05-10 NOTE — Therapy (Signed)
[place], [place], [address] Phone: [phone]   Fax:  [phone]  Physical Therapy Evaluation  Patient Details  Name: James Zamora MRN: [id number] Date of Birth: 1955-12-21 Referring Provider: Dr. Curt Bears  Encounter Date: 05/10/2017      PT End of Session - 05/10/17 1733    Visit Number 1   Number of Visits 1   PT Start Time 2620   PT Stop Time 1717   PT Time Calculation (min) 24 min   Activity Tolerance Patient tolerated treatment well   Behavior During Therapy [facility] for tasks assessed/performed      Past Medical History:  Diagnosis Date  . Adenocarcinoma of left lung, stage 3 ([place]) 05/10/2017  . Asthma   . Chronic diastolic CHF (congestive heart failure) ([place])   . Lung mass   . Non-small cell lung cancer ([place]) 11/13/2015  . Obesity   . Perforated bowel [facility])     Past Surgical History:  Procedure Laterality Date  . APPENDECTOMY    . CHOLECYSTECTOMY    . COLONOSCOPY N/A 07/31/2016   Procedure: COLONOSCOPY;  Surgeon: Danie Binder, MD;  Location: [facility] END[facility] SU[facility];  Service: Endoscopy;  Laterality: N/A;  1:45 pm  . ESOPHAGOGASTRODUODENOSCOPY N/A 07/31/2016   Procedure: ESOPHAGOGASTRODUODENOSCOPY (EGD);  Surgeon: Danie Binder, MD;  Location: [facility] ENDO SUITE;  Service: Endoscopy;  Laterality: N/A;  . HERNIA REPAIR      There were no vitals filed for this visit.       Subjective Assessment - 05/10/17 1720    Subjective No complaints today.   Patient is accompained by: Family member  sister [name]   Pertinent History Incidental finding of lung masswhen being worked up for abdominal pain/nausea/vomiting.  Scans and biopsy resulted in diagnosis of left lower lobe adenocarcinoma, 3.7 cm, stage III cancer.  He has a hypermetabolic area at pancreatic head seen on PET scan that will require further workup. He is expected to have chemoradiation treatment.  Ex-smoker 20 pack-years, CHF,  asthma.  On 3L oxygen fulltime.   Patient Stated Goals get info from all lung clinic providers   Currently in Pain? No/denies            [facility] PT Assessment - 05/10/17 0001      Assessment   Medical Diagnosis stage III adenocarcinoma of left lower lobe of lung   Referring Provider Dr. Curt Bears   Onset Date/Surgical Date 02/21/17   Hand Dominance Right   Prior Therapy none     Precautions   Precautions Fall   Precaution Comments on 3L O2 fulltime; cancer precautions     Restrictions   Weight Bearing Restrictions No     Balance Screen   Has the patient fallen in the past 6 months Yes   How many times? 1  fell from hypotension; needed meds adjustment   Has the patient had a decrease in activity level because of a fear of falling?  No   Is the patient reluctant to leave their home because of a fear of falling?  No     Home Ecologist residence   Transport planner;Other relatives  mother, brother and sister   Type of [place] One level   Home Equipment Other (comment)  oxygen, 3L setting     Prior Function   Level of [place] Needs assistance with ADLs   Leisure no regular exercise besides walking  in the house and to the mailbox     Cognition   Overall Cognitive Status History of cognitive impairments - at baseline     Observation/Other Assessments   Observations patient's sister speaks for him or prompts him much of the time     Coordination   Gross Motor Movements are Fluid and Coordinated Yes     Functional Tests   Functional tests Sit to Stand     Sit to Stand   Comments 12 times in 30 seconds with O2 on 3L, below average for age  mild dyspnea following     Posture/Postural Control   Posture/Postural Control Postural limitations   Postural Limitations Forward head     ROM / Strength   AROM / PROM / Strength AROM     AROM   Overall AROM Comments standing trunk AROM WFL all directions      Ambulation/Gait   Ambulation/Gait Yes   Ambulation/Gait Assistance 6: Modified independent (Device/Increase time)   Assistive device Other (Comment)  O2 @ 3L     Balance   Balance Assessed Yes     Dynamic Standing Balance   Dynamic Standing - Comments reaches forward 10 inches in standing, below average for age            Objective measurements completed on examination: See above findings.                  PT Education - 05/10/17 1732    Education provided Yes   Education Details energy conservation, walking, CURE article on staying active, posture, breathing, PT info   Person(s) Educated Patient;Other (comment)  sister [name]   Methods Explanation;Handout   Comprehension Verbalized understanding               Lung Clinic Goals - 05/10/17 1738      Patient will be able to verbalize understanding of the benefit of exercise to decrease fatigue.   Status Achieved     Patient will be able to verbalize the importance of posture.   Status Achieved     Patient will be able to demonstrate diaphragmatic breathing for improved lung function.   Status Achieved     Patient will be able to verbalize understanding of the role of physical therapy to prevent functional decline and who to contact if physical therapy is needed.   Status Achieved              Plan - 05/10/17 1733    Clinical Impression Statement Patient who seems to have some type of developmental disability is accompanied by his sister [name] today, and she answers for him or prompts him frequently.  He has a diagnosis of stage III lung cancer and is expected to undergo chemoradiation.  He is on 3L O2 fulltime. His trunk AROM is good, but forward reach in standing is decreased as is his number of times standing from sitting in 30 seconds compared to normal. He has forward head posture.   History and Personal Factors relevant to plan of care: decreased cognitive capacity of some sort, on  3L O2 fulltime   Clinical Presentation Evolving   Clinical Presentation due to: just diagnosed with stage III lung cancer and about to begin chemoradiation for that   Clinical Decision Making Moderate   Rehab Potential Fair   PT Frequency One time visit   PT Treatment/Interventions Patient/family education   PT Next Visit Plan None at this time.   PT Home Exercise Plan walking  program, breathing exercises, posture   Consulted and Agree with Plan of Care Patient;Family member/caregiver   Family Member Consulted sister, [name]      Patient will benefit from skilled therapeutic intervention in order to improve the following deficits and impairments:  Cardiopulmonary status limiting activity, Decreased balance, Postural dysfunction  Visit Diagnosis: History of falling - Plan: PT plan of care cert/re-cert  Other abnormalities of gait and mobility - Plan: PT plan of care cert/re-cert      G-Codes - 26/37/85 1738    Functional Assessment Tool Used (Outpatient Only) clinical judgement   Functional Limitation Mobility: Walking and moving around   Mobility: Walking and Moving Around Current Status [phone]) At least 20 percent but less than 40 percent impaired, limited or restricted   Mobility: Walking and Moving Around Goal Status [phone]) At least 20 percent but less than 40 percent impaired, limited or restricted   Mobility: Walking and Moving Around Discharge Status [phone]) At least 20 percent but less than 40 percent impaired, limited or restricted       Problem List Patient Active Problem List   Diagnosis Date Noted  . Adenocarcinoma of left lung, stage 3 ([place]) 05/10/2017  . COPD (chronic obstructive pulmonary disease) ([place]) 03/29/2017  . Edema of left lower extremity 11/27/2016  . Swelling   . Acute diastolic CHF (congestive heart failure) ([place]) 06/07/2016  . Emesis 06/07/2016  . Generalized abdominal pain 06/07/2016  . Anemia 06/07/2016  . Dehydration 11/13/2015  . AKI (acute  kidney injury) ([place]) 11/13/2015  . Wound infection 11/13/2015  . Non-small cell lung cancer [place]) - Adenocarcinoma 11/13/2015    SALISBURY,DONNA 05/10/2017, 5:41 PM  [place], [place], [address] Phone: [phone]   Fax:  [phone]  Name: James Zamora MRN: [id number] Date of Birth: [birth year]  Serafina Royals, PT 05/10/17 5:41 PM

## 2017-05-10 NOTE — Progress Notes (Signed)
@Patient  ID: James Zamora, male    DOB: 1956-09-02, 61 y.o.   MRN: [id number]  Chief Complaint  Patient presents with  . Follow-up    COPD , Lung cancer     Referring provider: [name], MD  HPI: 61 year old male former smoker seen for pulmonary consult 03/01/2017 for lung mass  TEST  Pulmonary tests CT chest 02/21/17 >> superior segment LLL 3.9 cm mass, 1.3 cm Lt paratracheal LAN PET scan 03/09/17 >> 3.7 cm LLL mass 14.9 SUV, 12 mm AP LAN 5.2 SUV, 2.7 cm density pancreatic head 7.4 SUV  Cardiac tests Echo 06/08/16 >> mod LVH, EF 60 to 65%  05/10/2017 Follow up : Lung Cancer , COPD  Patient returns for a 6 week follow-up. Patient was seen last for a follow-up of a lung mass. CT chest showed a 3.9 cm left lower lobe lung mass. Subsequent PET scan was hypermetabolic activity in the left lower lobe mass . PET scan showed a 2.7 cm pancreatic head density.[facility] MRI abdomen showed a soft tissue fullness along the head of the pancreas. Patient was referred to GI. Patient was set up for a CT-guided lung biopsy. Pathology was positive for adenocarcinoma. He has been referred to oncology and has an appointment later today.   Patient had a PFT that showed severe airflow obstruction with an FEV1 at 39%, ratio 54 and FVC of 56%. Patient was recommended to begin BREO .  Patient says overall that his breathing is doing okay. He denies a flare cough or wheezing. He remains on oxygen at 2 L. Patient says he's not very active.     Allergies  Allergen Reactions  . Nutritional Supplements Hives and Swelling  . Penicillins     Pt has tolerated cephalosporins in the past Has patient had a PCN reaction causing immediate rash, facial/tongue/throat swelling, SOB or lightheadedness with hypotension: unknown Has patient had a PCN reaction causing severe rash involving mucus membranes or skin necrosis: unknown Has patient had a PCN reaction that required hospitalization: unknown Has patient had a PCN  reaction occurring within the last 10 years: unknown If all of the above answers are "NO", then may proceed with Cephalosporin [place]    Immunization History  Administered Date(s) Administered  . Influenza,inj,Quad PF,36+ Mos 08/11/2016    Past Medical History:  Diagnosis Date  . Asthma   . Chronic diastolic CHF (congestive heart failure) ([place])   . Lung mass   . Non-small cell lung cancer ([place]) 11/13/2015  . Obesity   . Perforated bowel ([place])     Tobacco History: History  Smoking Status  . Former Smoker  . Packs/day: 2.00  . Years: 10.00  . Types: Cigarettes  . Quit date: 07/06/2012  Smokeless Tobacco  . Former Systems developer  . Types: Chew  . Quit date: 07/06/2014   Counseling given: Not Answered   Outpatient Encounter Prescriptions as of 05/10/2017  Medication Sig  . aspirin EC 81 MG EC tablet Take 1 tablet (81 mg total) by mouth daily.  . fluticasone furoate-vilanterol (BREO ELLIPTA) 200-25 MCG/INH AEPB Inhale 1 puff into the lungs daily.  . furosemide (LASIX) 40 MG tablet Take 3 tablets (120 mg total) by mouth daily. (Patient taking differently: Take 40 mg by mouth daily. )  . OXYGEN Inhale 3 L into the lungs continuous.  . potassium chloride SA (KLOR-CON M20) 20 MEQ tablet Take 1 tablet (20 mEq total) by mouth daily.   No facility-administered encounter medications on file as of 05/10/2017.  Review of Systems  Constitutional:   No  weight loss, night sweats,  Fevers, chills,  +fatigue, or  lassitude.  HEENT:   No headaches,  Difficulty swallowing,  Tooth/dental problems, or  Sore throat,                No sneezing, itching, ear ache, nasal congestion, post nasal drip,   CV:  No chest pain,  Orthopnea, PND anasarca, dizziness, palpitations, syncope.   GI  No heartburn, indigestion, abdominal pain, nausea, vomiting, diarrhea, change in bowel habits, loss of appetite, bloody stools.   Resp:   No excess mucus, no productive cough,  No non-productive cough,  No coughing up  of blood.  No change in color of mucus.  No wheezing.  No chest wall deformity  Skin: no rash or lesions.  GU: no dysuria, change in color of urine, no urgency or frequency.  No flank pain, no hematuria   MS:  No joint pain or swelling.  No decreased range of motion.  No back pain.    Physical Exam  BP 110/70 (BP Location: Left Arm, Cuff Size: Normal)   Pulse 97   Ht 5' 2.5" (1.588 m)   Wt 227 lb 3.2 oz (103.1 kg)   SpO2 (!) 89%   BMI 40.89 kg/m   GEN: A/Ox3; pleasant , NAD, obese    HEENT:  [place]/AT,  EACs-clear, TMs-wnl, NOSE-clear, THROAT-clear, no lesions, no postnasal drip or exudate noted.   NECK:  Supple w/ fair ROM; no JVD; normal carotid impulses w/o bruits; no thyromegaly or nodules palpated; no lymphadenopathy.    RESP  Decreased BS in bases ,  no accessory muscle use, no dullness to percussion  CARD:  RRR, no m/r/g, 1+ peripheral edema, pulses intact, no cyanosis or clubbing.  GI:   Soft & nt; nml bowel sounds; no organomegaly or masses detected.   Musco: Warm bil, no deformities or joint swelling noted.   Neuro: alert, no focal deficits noted.    Skin: Warm, no lesions or rashes    Lab Results:  CBC  BMET   BNP ProBNP No results found for: PROBNP  Imaging: Ct Biopsy  Result Date: 04/24/2017 INDICATION: Left lower lobe PET positive mass EXAM: CT LEFT LOWER LOBE MASS CORE BIOPSY MEDICATIONS: 1% LIDOCAINE LOCALLY ANESTHESIA/SEDATION: Moderate (conscious) sedation was employed during this procedure. A total of Versed 1.0 mg and Fentanyl 25 mcg was administered intravenously. Moderate Sedation Time: 11 minutes. The patient's level of consciousness and vital signs were monitored continuously by radiology nursing throughout the procedure under my direct supervision. FLUOROSCOPY TIME:  Fluoroscopy Time: NONE. COMPLICATIONS: None immediate. PROCEDURE: Informed written consent was obtained from the patient after a thorough discussion of the procedural risks,  benefits and alternatives. All questions were addressed. Maximal Sterile Barrier Technique was utilized including caps, mask, sterile gowns, sterile gloves, sterile drape, hand hygiene and skin antiseptic. A timeout was performed prior to the initiation of the procedure. Previous imaging reviewed. Patient position left side down decubitus. Noncontrast localization CT performed. The left lower lobe mass was localized by CT. Overlying skin was marked for a posterior approach. Under sterile conditions and local anesthesia, a 17 gauge 6.8 cm coaxial guide needle was advanced to the left lower lobe lesion. Needle position confirmed within the medial aspect of the mass. 2 18 gauge core biopsies obtained. Samples were intact and non fragmented. These were placed the formalin. Needle tract embolized with the Biosentry occlusion device. Postprocedure imaging demonstrates no effusion or  pneumothorax. Patient tolerated the biopsy well. IMPRESSION: Successful CT-guided core biopsy of the left lower lobe mass. Electronically Signed   By: Jerilynn Mages.  Shick M.D.   On: 04/24/2017 11:16   Dg Chest Port 1 View  Result Date: 04/24/2017 CLINICAL DATA:  Status post biopsy of the left lung nodule under CT earlier today. EXAM: PORTABLE CHEST 1 VIEW COMPARISON:  Chest x-ray of Mar 08, 2017 and CT scan of the chest of today's date FINDINGS: The lungs are adequately inflated. There is no pneumothorax or pleural effusion. The interstitial markings are coarse. The heart and pulmonary vascularity are normal. IMPRESSION: There is no immediate postprocedure complication following CT directed left lung biopsy. Electronically Signed   By: David  Martinique M.D.   On: 04/24/2017 12:17     Assessment & Plan:   Non-small cell lung cancer ([place]) - Adenocarcinoma Referral to Oncology - Ov for this afternoon .    COPD (chronic obstructive pulmonary disease) ([facility]) Severe COPD improved sx control on BREO .   Plan:  Patient Instructions  Continue on  BREO 1 puff daily  Continue on Oxygen 2l/m .  Follow up Dr. Halford Chessman  In 3 months  and As needed   Please contact office for sooner follow up if symptoms do not improve or worsen or seek emergency care          Rexene Edison, NP 05/10/2017

## 2017-05-10 NOTE — Progress Notes (Signed)
START ON PATHWAY REGIMEN - Non-Small Cell Lung     Administer weekly:     Paclitaxel      Carboplatin   **Always confirm dose/schedule in your pharmacy ordering system**    Patient Characteristics: Stage III - Unresectable, PS = 0, 1 AJCC T Category: T2a Current Disease Status: No Distant Mets or Local Recurrence AJCC N Category: N2 AJCC M Category: M0 AJCC 8 Stage Grouping: IIIA Performance Status: PS = 0, 1 Intent of Therapy: Curative Intent, Discussed with Patient

## 2017-05-10 NOTE — Progress Notes (Signed)
I have reviewed and agree with assessment/plan.  Chesley Mires, MD [facility] Pulmonary/Critical Care 05/10/2017, 3:31 PM Pager:  [phone]

## 2017-05-10 NOTE — Addendum Note (Signed)
Addended by: Maryanna Shape A on: 05/10/2017 03:22 PM   Modules accepted: Orders

## 2017-05-10 NOTE — Assessment & Plan Note (Signed)
Referral to Oncology - Ov for this afternoon .

## 2017-05-10 NOTE — Assessment & Plan Note (Signed)
Severe COPD improved sx control on BREO .   Plan:  Patient Instructions  Continue on BREO 1 puff daily  Continue on Oxygen 2l/m .  Follow up Dr. Halford Chessman  In 3 months  and As needed   Please contact office for sooner follow up if symptoms do not improve or worsen or seek emergency care

## 2017-05-10 NOTE — Patient Instructions (Signed)
Continue on BREO 1 puff daily  Continue on Oxygen 2l/m .  Follow up Dr. Halford Chessman  In 3 months  and As needed   Please contact office for sooner follow up if symptoms do not improve or worsen or seek emergency care

## 2017-05-10 NOTE — Progress Notes (Signed)
Radiation Oncology         [phone] ________________________________  Multidisciplinary Thoracic Oncology Clinic [facility]) Initial Outpatient Consultation  Name: James Zamora MRN: [id number]  Date: 05/10/2017  DOB: Feb 11, 1956  LO:VFIE, Berneta Sages, MD  Chesley Mires, MD   REFERRING PHYSICIAN: Chesley Mires, MD  DIAGNOSIS: 61 y.o. male with Stage III NSCLC, adenocarcinoma of the left lower lung.  HISTORY OF PRESENT ILLNESS::James Zamora is a 61 y.o. male with a new diagnosis of stage III lung cancer.  This was an incidental finding on CT imaging for work up of abdominal pain, nausea and vomiting in 01/2017.  CT chest on 02/21/17 showed a superior segment left lower lobe mass measuring 3.7 x 3.6 x 3.9 cm and associated left lower paratracheal lymphadenopathy measuring to 13 mm short axis- concerning for primary bronchogenic carcinoma with possible mediastinal lymphadenopathy/metastasis.  PET CT on 03/09/17 for further evaluation confirmed a 3.7 cm hypermetabolic left lower lobe lung mass, consistent with primary bronchogenic carcinoma. Additionally, there was a 12 mm hypermetabolic mediastinal lymph node in the [facility] window,suspicious for lymph node metastasis as well as a 2.7 cm hypermetabolic soft tissue density in pancreatic head.   MRI abdomen on 03/31/17 for further evaluation of the pancreatic mass failed to demonstrate a definite mass although assessment was felt limited by inability to obtain dynamic postcontrast imaging secondary to patient illness. Given the demonstrated hypermetabolism in this region on PET imaging, further workup with endoscopic ultrasound was recommended.  He has not yet had further evaluation with gastroenterology.  He underwent a CT guided core biopsy of the LLL lesion on 04/24/17 by Dr. Annamaria Boots.  Pathology revealed adenocarcinoma.  The patient was referred today for presentation in the multidisciplinary conference.  Radiology studies and pathology slides were  presented there for review and discussion of treatment options.  A consensus was discussed regarding potential next steps.  He presents today with his sister to discuss treatment recommendations. He does have severe COPD with emphysema/asthma component and CHF.  He uses a Breo inhaler daily and is on O2 via [place] 24/7.  PREVIOUS RADIATION THERAPY: No  PAST MEDICAL HISTORY:  has a past medical history of Adenocarcinoma of left lung, stage 3 ([place]) (05/10/2017); Asthma; Chronic diastolic CHF (congestive heart failure) ([place]); Lung mass; Non-small cell lung cancer ([place]) (11/13/2015); Obesity; and Perforated bowel ([place]).    PAST SURGICAL HISTORY: Past Surgical History:  Procedure Laterality Date  . APPENDECTOMY    . CHOLECYSTECTOMY    . COLONOSCOPY N/A 07/31/2016   Procedure: COLONOSCOPY;  Surgeon: Danie Binder, MD;  Location: [facility];  Service: Endoscopy;  Laterality: N/A;  1:45 pm  . ESOPHAGOGASTRODUODENOSCOPY N/A 07/31/2016   Procedure: ESOPHAGOGASTRODUODENOSCOPY (EGD);  Surgeon: Danie Binder, MD;  Location: [facility] END[facility] SUITE;  Service: Endoscopy;  Laterality: N/A;  . HERNIA REPAIR      FAMILY HISTORY: family history includes Asthma in his father; Diabetes in his mother.  SOCIAL HISTORY:  reports that he quit smoking about 4 years ago. His smoking use included Cigarettes. He has a 20.00 pack-year smoking history. He quit smokeless tobacco use about 2 years ago. His smokeless tobacco use included Chew. He reports that he does not drink alcohol or use drugs.  ALLERGIES: Bee venom and Penicillins  MEDICATIONS:  Current Outpatient Prescriptions  Medication Sig Dispense Refill  . aspirin EC 81 MG EC tablet Take 1 tablet (81 mg total) by mouth daily. 30 tablet 1  . fluticasone furoate-vilanterol (BREO ELLIPTA) 200-25 MCG/INH AEPB  Inhale 1 puff into the lungs daily. 1 each 5  . fluticasone furoate-vilanterol (BREO ELLIPTA) 200-25 MCG/INH AEPB Inhale 1 puff into the lungs daily. 14 each 0  .  furosemide (LASIX) 40 MG tablet Take 3 tablets (120 mg total) by mouth daily. (Patient taking differently: Take 40 mg by mouth daily. ) 90 tablet 6  . OXYGEN Inhale 3 L into the lungs continuous.    . potassium chloride SA (KLOR-CON M20) 20 MEQ tablet Take 1 tablet (20 mEq total) by mouth daily. 30 tablet 6  . prochlorperazine (COMPAZINE) 10 MG tablet Take 1 tablet (10 mg total) by mouth every 6 (six) hours as needed for nausea or vomiting. 30 tablet 0   No current facility-administered medications for this encounter.     REVIEW OF SYSTEMS:  On review of systems, the patient reports that he is doing well overall. He denies any chest pain, increased shortness of breath, fevers, chills, night sweats, unintended weight changes. He has a chronic productive cough with white sputum.  He denies any bowel or bladder disturbances, and denies current abdominal pain, nausea or vomiting. He denies any new musculoskeletal or joint aches or pains, new skin lesions or concerns. A complete review of systems is obtained and is otherwise negative.  PHYSICAL EXAM:   In general this is a well appearing african [place] male in no acute distress. He is alert and oriented x4 and appropriate throughout the examination. HEENT reveals that the patient is normocephalic, atraumatic. EOMs are intact. PERRLA. Skin is intact without any evidence of gross lesions. Cardiovascular exam reveals a regular rate and rhythm, no clicks rubs or murmurs are auscultated. Chest is clear to auscultation bilaterally but has decreased breath sounds bilaterally in the lung bases. Lymphatic assessment is performed and does not reveal any adenopathy in the cervical, supraclavicular, axillary, or inguinal chains. Abdomen has active bowel sounds in all quadrants and is intact. The abdomen is soft, non tender, non distended. Lower extremities are negative for deep calf tenderness, cyanosis or clubbing.  He has moderate lymphedema of the left lower  extremity and 1+ pitting edema of the right lower extremity.     KPS = 70  100 - Normal; no complaints; no evidence of disease. 90   - Able to carry on normal activity; minor signs or symptoms of disease. 80   - Normal activity with effort; some signs or symptoms of disease. 47   - Cares for self; unable to carry on normal activity or to do active work. 60   - Requires occasional assistance, but is able to care for most of his personal needs. 50   - Requires considerable assistance and frequent medical care. 15   - Disabled; requires special care and assistance. 24   - Severely disabled; hospital admission is indicated although death not imminent. 57   - Very sick; hospital admission necessary; active supportive treatment necessary. 10   - Moribund; fatal processes progressing rapidly. 0     - Dead  Karnofsky DA, Abelmann [place], Craver LS and Burchenal Navos [phone]) The use of the nitrogen mustards in the palliative treatment of carcinoma: with particular reference to bronchogenic carcinoma Cancer 1 634-56  LABORATORY DATA:  Lab Results  Component Value Date   WBC 6.1 04/24/2017   HGB 12.4 (L) 04/24/2017   HCT 40.5 04/24/2017   MCV 84.2 04/24/2017   PLT 272 04/24/2017   Lab Results  Component Value Date   NA 138 03/08/2017   K 4.6  03/08/2017   CL 100 (L) 03/08/2017   CO2 31 03/08/2017   Lab Results  Component Value Date   ALT 21 03/08/2017   AST 18 03/08/2017   ALKPHOS 70 03/08/2017   BILITOT 0.5 03/08/2017    PULMONARY FUNCTION TEST:   Recent Review Flowsheet Data    Spirometry Latest Ref Rng & Units 03/29/2017   FVC-%PRED-PRE % 48   FVC-%PRED-POST % 56   FEV1-PRE L 0.77   FEV1-%PRED-PRE % 34   FEV1-POST L 0.89   FEV1-%PRED-POST % 39   DLCO [facility] ml/min/mmHg 8.78      RADIOGRAPHY: Ct Biopsy  Result Date: 04/24/2017 INDICATION: Left lower lobe PET positive mass EXAM: CT LEFT LOWER LOBE MASS CORE BIOPSY MEDICATIONS: 1% LIDOCAINE LOCALLY ANESTHESIA/SEDATION: Moderate  (conscious) sedation was employed during this procedure. A total of Versed 1.0 mg and Fentanyl 25 mcg was administered intravenously. Moderate Sedation Time: 11 minutes. The patient's level of consciousness and vital signs were monitored continuously by radiology nursing throughout the procedure under my direct supervision. FLUOROSCOPY TIME:  Fluoroscopy Time: NONE. COMPLICATIONS: None immediate. PROCEDURE: Informed written consent was obtained from the patient after a thorough discussion of the procedural risks, benefits and alternatives. All questions were addressed. Maximal Sterile Barrier Technique was utilized including caps, mask, sterile gowns, sterile gloves, sterile drape, hand hygiene and skin antiseptic. A timeout was performed prior to the initiation of the procedure. Previous imaging reviewed. Patient position left side down decubitus. Noncontrast localization CT performed. The left lower lobe mass was localized by CT. Overlying skin was marked for a posterior approach. Under sterile conditions and local anesthesia, a 17 gauge 6.8 cm coaxial guide needle was advanced to the left lower lobe lesion. Needle position confirmed within the medial aspect of the mass. 2 18 gauge core biopsies obtained. Samples were intact and non fragmented. These were placed the formalin. Needle tract embolized with the Biosentry occlusion device. Postprocedure imaging demonstrates no effusion or pneumothorax. Patient tolerated the biopsy well. IMPRESSION: Successful CT-guided core biopsy of the left lower lobe mass. Electronically Signed   By: Jerilynn Mages.  Shick M.D.   On: 04/24/2017 11:16   Dg Chest Port 1 View  Result Date: 04/24/2017 CLINICAL DATA:  Status post biopsy of the left lung nodule under CT earlier today. EXAM: PORTABLE CHEST 1 VIEW COMPARISON:  Chest x-ray of Mar 08, 2017 and CT scan of the chest of today's date FINDINGS: The lungs are adequately inflated. There is no pneumothorax or pleural effusion. The  interstitial markings are coarse. The heart and pulmonary vascularity are normal. IMPRESSION: There is no immediate postprocedure complication following CT directed left lung biopsy. Electronically Signed   By: David  Martinique M.D.   On: 04/24/2017 12:17      IMPRESSION/PLAN: 61 y.o. male with Stage III NSCLC, adenocarcinoma of the left lower lung. Today, we talked to the patient and his siter about the findings and workup thus far. We discussed the natural history of stage III NSCLC and general treatment, highlighting the role of radiotherapy in the management. We discussed the available radiation techniques, and focused on the details of logistics and delivery. We recommend a 6.5 week course of daily EBRT concurrent with systemic chemotherapy.  We reviewed the anticipated acute and late sequelae associated with radiation in this setting. The patient was encouraged to ask questions that were answered to his satisfaction.  At the end of our conversation, the patient is interested in proceeding with concurrent chemoradiation as per recommendations.  He appears  to have a good understanding of his disease and the treatment recommendations. He is tentatively scheduled to begin systemic chemoradiation on 05/21/17.  Dr. Julien Nordmann will arrange for GI consult to further evaluate the pancreatic mass. The patient will be scheduled for MRI brain to r/o metastatic disease to the brain.  He is scheduled for CT Simulation at 1pm on Friday 05/11/17.  The patient freely signed written consent to proceed and a copy of this document was placed in the patient's chart.  We spent 60 minutes minutes face to face with the patient and more than 50% of that time was spent in counseling and/or coordination of care.      Nicholos Johns, PA-C    Tyler Pita, MD  [facility] Direct Dial: [phone]  Fax: [phone] [email or web address]  Skype  [email or web address]  This document serves as a record of services  personally performed by Tyler Pita, MD and Ashlyn Bruning PA-C. It was created on their behalf by Delton Coombes, a trained medical scribe. The creation of this record is based on the scribe's personal observations and the provider's statements to them. This document has been checked and approved by the attending provider.

## 2017-05-10 NOTE — Progress Notes (Signed)
[facility] [phone]   [phone] Multidisciplinary thoracic oncology clinic  CONSULT NOTE  REFERRING PHYSICIAN: Dr. Chesley Mires  REASON FOR CONSULTATION:  61 years old African-American male recently diagnosed with lung cancer.  HPI James Zamora James a 61 y.o. male was past medical history significant for asthma, congestive heart failure, COPD as well as long history of smoking. James patient was seen at James emergency department on 02/17/2017 complaining of abdominal pain as well as nausea and vomiting. CT of James abdomen performed on that day showed no acute intra-abdominal or pelvic pathology but there was seen 0.0 x 3.5 cm mass in James left lower lobe most consistent with malignancy possibly metastatic versus a primary pulmonary neoplasm. This was followed by CT scan of James chest without contrast on 02/21/2017 and it showed 3.7 x 3.6 x 3.9 cm pulmonary mass concerning for primary bronchogenic carcinoma within James superior segment of James left lower lobe. There was also 1.3 cm short axis left lower paratracheal lymph node. James patient was referred to Dr. Halford Chessman. He had a PET scan performed on 03/09/2017 and it showed hypermetabolic 3.7 cm left lower lobe mass with hypermetabolic 1.2 cm AP window lymph node. There was no other hypermetabolic lymph nodes identified in James chest and no evidence of metastatic disease except for 2.7 cm hypermetabolic soft tissue density in James pancreatic head suspicious for primary pancreatic carcinoma and metastatic peripancreatic lymphadenopathy. MRI of James abdomen was performed in 04/19/2017 and it showed James soft tissue fullness identified in James head of James pancreas appears similar to James prior CT of James abdomen and pelvis dating back to 12/31/2009. No definite mass identified at James location on James MRI body assessment was limited and endoscopic ultrasound was recommended. On 04/24/2017 James patient underwent CT-guided core biopsy  of James left lower lobe lung mass by interventional radiology and James final pathology ([id number]) was consistent with adenocarcinoma. James immunohistochemical stain was positive for TTF-1 and negative for cytokeratin 5/6.  James patient was referred to James multidisciplinary thoracic oncology clinic [facility] for evaluation and recommendation regarding treatment of his condition. When seen [facility] James patient James feeling fine with no specific complaints except for James baseline shortness breath and he James currently on home oxygen. He denied having any chest pain, cough or hemoptysis. He denied having any nausea, vomiting, abdominal pain, diarrhea or constipation. Has no significant weight loss or night sweats. He has no headache or visual changes. Family history significant for mother with diabetes mellitus and father with prostate cancer. James patient James single and has no children. He lives with his mother and sister. He was accompanied Zamora by his sister [name]. He works in farming. He has a history of smoking for around 15-20 years and quit 3 years ago. He has no history of alcohol or drug abuse.  HPI  Past Medical History:  Diagnosis Date  . Asthma   . Chronic diastolic CHF (congestive heart failure) ([place])   . Lung mass   . Non-small cell lung cancer ([place]) 11/13/2015  . Obesity   . Perforated bowel [facility])     Past Surgical History:  Procedure Laterality Date  . APPENDECTOMY    . CHOLECYSTECTOMY    . COLONOSCOPY N/A 07/31/2016   Procedure: COLONOSCOPY;  Surgeon: Danie Binder, MD;  Location: [facility] SUITE;  Service: Endoscopy;  Laterality: N/A;  1:45 pm  . ESOPHAGOGASTRODUODENOSCOPY N/A 07/31/2016   Procedure: ESOPHAGOGASTRODUODENOSCOPY (EGD);  Surgeon: Danie Binder,  MD;  Location: [facility];  Service: Endoscopy;  Laterality: N/A;  . HERNIA REPAIR      Family History  Problem Relation Age of Onset  . Diabetes Mother   . Asthma Father   . Colon cancer Neg Hx     Social History Social  History  Substance Use Topics  . Smoking status: Former Smoker    Packs/day: 2.00    Years: 10.00    Types: Cigarettes    Quit date: 07/06/2012  . Smokeless tobacco: Former Systems developer    Types: Chew    Quit date: 07/06/2014  . Alcohol use No    Allergies  Allergen Reactions  . Nutritional Supplements Hives and Swelling  . Penicillins     Pt has tolerated cephalosporins in James past Has patient had a PCN reaction causing immediate rash, facial/tongue/throat swelling, SOB or lightheadedness with hypotension: unknown Has patient had a PCN reaction causing severe rash involving mucus membranes or skin necrosis: unknown Has patient had a PCN reaction that required hospitalization: unknown Has patient had a PCN reaction occurring within James last 10 years: unknown If all of James above answers are "NO", then may proceed with Cephalosporin [place]    Current Outpatient Prescriptions  Medication Sig Dispense Refill  . aspirin EC 81 MG EC tablet Take 1 tablet (81 mg total) by mouth daily. 30 tablet 1  . fluticasone furoate-vilanterol (BREO ELLIPTA) 200-25 MCG/INH AEPB Inhale 1 puff into James lungs daily. 1 each 5  . fluticasone furoate-vilanterol (BREO ELLIPTA) 200-25 MCG/INH AEPB Inhale 1 puff into James lungs daily. 14 each 0  . furosemide (LASIX) 40 MG tablet Take 3 tablets (120 mg total) by mouth daily. (Patient taking differently: Take 40 mg by mouth daily. ) 90 tablet 6  . OXYGEN Inhale 3 L into James lungs continuous.    . potassium chloride SA (KLOR-CON M20) 20 MEQ tablet Take 1 tablet (20 mEq total) by mouth daily. 30 tablet 6   No current facility-administered medications for this visit.     Review of Systems  Constitutional: positive for fatigue Eyes: negative Ears, nose, mouth, throat, and face: negative Respiratory: positive for dyspnea on exertion Cardiovascular: negative Gastrointestinal: negative Genitourinary:negative Integument/breast: negative Hematologic/lymphatic:  negative Musculoskeletal:negative Neurological: negative Behavioral/Psych: negative Endocrine: negative Allergic/Immunologic: negative  Physical Exam  QIO:NGEXB, healthy, no distress, well nourished and well developed SKIN: skin color, texture, turgor are normal, no rashes or significant lesions HEAD: Normocephalic, No masses, lesions, tenderness or abnormalities EYES: normal, PERRLA, Conjunctiva are pink and non-injected EARS: External ears normal, Canals clear OROPHARYNX:no exudate, no erythema and lips, buccal mucosa, and tongue normal  NECK: supple, no adenopathy, no JVD LYMPH:  no palpable lymphadenopathy, no hepatosplenomegaly LUNGS: clear to auscultation , and palpation HEART: regular rate & rhythm, no murmurs and no gallops ABDOMEN:abdomen soft, non-tender, normal bowel sounds and no masses or organomegaly BACK: Back symmetric, no curvature., No CVA tenderness EXTREMITIES:no joint deformities, effusion, or inflammation, no edema, no skin discoloration  NEURO: alert & oriented x 3 with fluent speech, no focal motor/sensory deficits  PERFORMANCE STATUS: ECOG 1  LABORATORY DATA: Lab Results  Component Value Date   WBC 6.1 04/24/2017   HGB 12.4 (L) 04/24/2017   HCT 40.5 04/24/2017   MCV 84.2 04/24/2017   PLT 272 04/24/2017      Chemistry      Component Value Date/Time   NA 138 03/08/2017 1851   K 4.6 03/08/2017 1851   CL 100 (L) 03/08/2017 1851  CO2 31 03/08/2017 1851   BUN 25 (H) 03/08/2017 1851   CREATININE 1.19 03/08/2017 1851   CREATININE 1.21 01/10/2017 1545      Component Value Date/Time   CALCIUM 9.2 03/08/2017 1851   ALKPHOS 70 03/08/2017 1851   AST 18 03/08/2017 1851   ALT 21 03/08/2017 1851   BILITOT 0.5 03/08/2017 1851       RADIOGRAPHIC STUDIES: Ct Biopsy  Result Date: 05/20/2017 INDICATION: Left lower lobe PET positive mass EXAM: CT LEFT LOWER LOBE MASS CORE BIOPSY MEDICATIONS: 1% LIDOCAINE LOCALLY ANESTHESIA/SEDATION: Moderate (conscious)  sedation was employed during this procedure. A total of Versed 1.0 mg and Fentanyl 25 mcg was administered intravenously. Moderate Sedation Time: 11 minutes. James patient's level of consciousness and vital signs were monitored continuously by radiology nursing throughout James procedure under my direct supervision. FLUOROSCOPY TIME:  Fluoroscopy Time: Zamora. COMPLICATIONS: Zamora immediate. PROCEDURE: Informed written consent was obtained from James patient after a thorough discussion of James procedural risks, benefits and alternatives. All questions were addressed. Maximal Sterile Barrier Technique was utilized including caps, mask, sterile gowns, sterile gloves, sterile drape, hand hygiene and skin antiseptic. A timeout was performed prior to James initiation of James procedure. Previous imaging reviewed. Patient position left side down decubitus. Noncontrast localization CT performed. James left lower lobe mass was localized by CT. Overlying skin was marked for a posterior approach. Under sterile conditions and local anesthesia, a 17 gauge 6.8 cm coaxial guide needle was advanced to James left lower lobe lesion. Needle position confirmed within James medial aspect of James mass. 2 18 gauge core biopsies obtained. Samples were intact and non fragmented. These were placed James formalin. Needle tract embolized with James Biosentry occlusion device. Postprocedure imaging demonstrates no effusion or pneumothorax. Patient tolerated James biopsy well. IMPRESSION: Successful CT-guided core biopsy of James left lower lobe mass. Electronically Signed   By: Jerilynn Mages.  Shick M.D.   On: May 20, 2017 11:16   Dg Chest Port 1 View  Result Date: 05-20-2017 CLINICAL DATA:  Status post biopsy of James left lung nodule under CT earlier [facility]. EXAM: PORTABLE CHEST 1 VIEW COMPARISON:  Chest x-ray of Mar 08, 2017 and CT scan of James chest of Zamora's date FINDINGS: James lungs are adequately inflated. There James no pneumothorax or pleural effusion. James interstitial markings  are coarse. James heart and pulmonary vascularity are normal. IMPRESSION: There James no immediate postprocedure complication following CT directed left lung biopsy. Electronically Signed   By: David  Martinique M.D.   On: 2017-05-20 12:17    ASSESSMENT: This James a very pleasant 62 years old African-American male recently diagnosed with a stage IIIa (T2a, N2, M0) non-small cell lung cancer, adenocarcinoma presented with left lower lobe lung mass in addition to mediastinal lymphadenopathy diagnosed in June 2018. James patient also has suspicious soft tissue density in James pancreatic head but this has been present for 3 years on previous CT scan and unlikely to be related to his current diagnosis of James lung cancer.   PLAN: I had a lengthy discussion with James patient and his sister Zamora about his current disease stage, prognosis and treatment options. I recommended for James patient to complete James staging workup by ordering a MRI of James brain to rule out brain metastasis. I will also ask James pathology department to send his tissue block for molecular studies and PDL 1. I discussed with James patient has treatment options.  I recommended for James patient a course of concurrent chemoradiation with weekly carboplatin for  AUC of 2 and paclitaxel 45 MG/M2. I discussed with James patient adverse effect of James chemotherapy including but not limited to alopecia, myelosuppression, nausea and vomiting, peripheral neuropathy, liver or renal dysfunction. I will arrange for James patient to have a chemotherapy education class before starting James first dose of his chemotherapy. I will call his pharmacy with prescription for Compazine 10 mg by mouth every 6 hours as needed for nausea. James patient would see Dr. Tammi Klippel later [facility] for evaluation and discussion of James radiotherapy option. He was seen during James multidisciplinary thoracic oncology clinic [facility] by medical oncology, radiation oncology, thoracic and gait are, physical  therapist and social worker. For James suspicious soft tissue mass in James pancreas, I will contact his gastroenterologist for reevaluation of this lesion and consideration of endoscopic ultrasound and biopsy if needed. James patient James expected to start James first dose of concurrent chemoradiation on 05/21/2017. He would come back for follow-up visit in 4 weeks for evaluation and management of any adverse effects of his treatment. He was advised to call immediately if he has any concerning symptoms in James interval. James patient voices understanding of current disease status and treatment options and James in agreement with James current care plan. All questions were answered. James patient knows to call James clinic with any problems, questions or concerns. We can certainly see James patient much sooner if necessary. Thank you so much for allowing me to participate in James care of MATAEO INGWERSEN. I will continue to follow up James patient with you and assist in his care.  I spent 55 minutes counseling James patient face to face. James total time spent in James appointment was 80 minutes.  Disclaimer: This note was dictated with voice recognition software. Similar sounding words can inadvertently be transcribed and may not be corrected upon review.   Hayzel Ruberg K. May 10, 2017, 3:29 PM

## 2017-05-11 ENCOUNTER — Ambulatory Visit
Admission: RE | Admit: 2017-05-11 | Discharge: 2017-05-11 | Disposition: A | Discharge status: Home / Self Care | Payer: Medicare Other | Source: Ambulatory Visit | Attending: Radiation Oncology | Admitting: Radiation Oncology

## 2017-05-11 DIAGNOSIS — E669 Obesity, unspecified: Secondary | ICD-10-CM | POA: Diagnosis not present

## 2017-05-11 DIAGNOSIS — C3432 Malignant neoplasm of lower lobe, left bronchus or lung: Secondary | ICD-10-CM | POA: Diagnosis not present

## 2017-05-11 DIAGNOSIS — Z880 Allergy status to penicillin: Secondary | ICD-10-CM | POA: Diagnosis not present

## 2017-05-11 DIAGNOSIS — Z7982 Long term (current) use of aspirin: Secondary | ICD-10-CM | POA: Diagnosis not present

## 2017-05-11 DIAGNOSIS — Z87891 Personal history of nicotine dependence: Secondary | ICD-10-CM | POA: Diagnosis not present

## 2017-05-11 DIAGNOSIS — Z91030 Bee allergy status: Secondary | ICD-10-CM | POA: Diagnosis not present

## 2017-05-11 DIAGNOSIS — J449 Chronic obstructive pulmonary disease, unspecified: Secondary | ICD-10-CM | POA: Diagnosis not present

## 2017-05-11 DIAGNOSIS — Z9049 Acquired absence of other specified parts of digestive tract: Secondary | ICD-10-CM | POA: Diagnosis not present

## 2017-05-11 DIAGNOSIS — Z9889 Other specified postprocedural states: Secondary | ICD-10-CM | POA: Diagnosis not present

## 2017-05-11 DIAGNOSIS — Z79899 Other long term (current) drug therapy: Secondary | ICD-10-CM | POA: Diagnosis not present

## 2017-05-11 DIAGNOSIS — I5032 Chronic diastolic (congestive) heart failure: Secondary | ICD-10-CM | POA: Diagnosis not present

## 2017-05-11 DIAGNOSIS — Z510 Encounter for antineoplastic radiation therapy: Secondary | ICD-10-CM | POA: Diagnosis not present

## 2017-05-11 DIAGNOSIS — C3492 Malignant neoplasm of unspecified part of left bronchus or lung: Secondary | ICD-10-CM

## 2017-05-11 NOTE — Progress Notes (Signed)
  Radiation Oncology         [phone]) [phone] ________________________________  Name: James Zamora MRN: [id number]  Date: 05/11/2017  DOB: [birth year]  SIMULATION AND TREATMENT PLANNING NOTE  DIAGNOSIS:  James Zamora is a 61 y.o. male with Stage III NSCLC, adenocarcinoma of the left lower lung.    ICD-10-CM   1. Adenocarcinoma of left lung, stage 3 (HCC) C34.92     NARRATIVE:  The patient was brought to the [facility].  Identity was confirmed.  All relevant records and images related to the planned course of therapy were reviewed.  The patient freely provided informed written consent to proceed with treatment after reviewing the details related to the planned course of therapy. The consent form was witnessed and verified by the simulation staff.  Then, the patient was set-up in a stable reproducible  supine position for radiation therapy.  CT images were obtained.  Surface markings were placed.  The CT images were loaded into the planning software.  Then the target and avoidance structures were contoured.  Treatment planning then occurred.  The radiation prescription was entered and confirmed.  Then, I designed and supervised the construction of a total of 6 medically necessary complex treatment devices, including a BodyFix immobilization mold custom fitted to the patient along with 5 multileaf collimators conformally shaped radiation around the treatment target while shielding critical structures such as the heart and spinal cord maximally.  I have requested : 3D Simulation  I have requested a DVH of the following structures: Left lung, right lung, spinal cord, heart, esophagus, and target.  I have ordered:Nutrition Consult  SPECIAL TREATMENT PROCEDURE:  The planned course of therapy using radiation constitutes a special treatment procedure. Special care is required in the management of this patient for the following reasons.  The patient will be receiving concurrent  chemotherapy requiring careful monitoring for increased toxicities of treatment including periodic laboratory values.  The special nature of the planned course of radiotherapy will require increased physician supervision and oversight to ensure patient's safety with optimal treatment outcomes.  PLAN:  The patient will receive 66 Gy in 33 fractions.  ________________________________  Sheral Apley Tammi Klippel, M.D.  This document serves as a record of services personally performed by Tyler Pita, MD. It was created on his behalf by Rae Lips, a trained medical scribe. The creation of this record is based on the scribe's personal observations and the provider's statements to them. This document has been checked and approved by the attending provider.

## 2017-05-12 ENCOUNTER — Telehealth: Payer: Self-pay | Admitting: Internal Medicine

## 2017-05-12 NOTE — Telephone Encounter (Signed)
LVM TO INFORM PT OF CHEMO CLASS 7/17 AT 0930 PER LOS

## 2017-05-14 ENCOUNTER — Telehealth: Payer: Self-pay | Admitting: Internal Medicine

## 2017-05-14 NOTE — Telephone Encounter (Signed)
Added additional weekly chemo appointments as per 7/12 los. No availability for 1 week f/u - message to MM. Spoke with sister re appointments and confirmed next appointment for tomorrow. Patient to get new schedule at tomorrow's visit.

## 2017-05-15 ENCOUNTER — Other Ambulatory Visit: Payer: Medicare Other

## 2017-05-16 ENCOUNTER — Encounter: Payer: Self-pay | Admitting: *Deleted

## 2017-05-16 ENCOUNTER — Telehealth: Payer: Self-pay | Admitting: Internal Medicine

## 2017-05-16 ENCOUNTER — Telehealth: Payer: Self-pay

## 2017-05-16 ENCOUNTER — Ambulatory Visit (HOSPITAL_COMMUNITY)
Admission: RE | Admit: 2017-05-16 | Discharge: 2017-05-16 | Disposition: A | Discharge status: Home / Self Care | Payer: Medicare Other | Source: Ambulatory Visit | Attending: Internal Medicine | Admitting: Internal Medicine

## 2017-05-16 DIAGNOSIS — C3492 Malignant neoplasm of unspecified part of left bronchus or lung: Secondary | ICD-10-CM

## 2017-05-16 DIAGNOSIS — R51 Headache: Secondary | ICD-10-CM | POA: Diagnosis not present

## 2017-05-16 LAB — CREATININE, SERUM
Creatinine, Ser: 1.37 mg/dL — ABNORMAL HIGH (ref 0.61–1.24)
GFR, EST NON AFRICAN AMERICAN: 54 mL/min — AB (ref >60)

## 2017-05-16 MED ORDER — GADOBENATE DIMEGLUMINE 529 MG/ML IV SOLN
20.0000 mL | Freq: Once | INTRAVENOUS | Status: AC | PRN
  Administered 2017-05-16: 20 mL via INTRAVENOUS

## 2017-05-16 NOTE — Telephone Encounter (Signed)
Sent Dr. Oneida Alar a pager to advise about this.

## 2017-05-16 NOTE — Progress Notes (Signed)
Oncology Nurse Navigator Documentation  Oncology Nurse Navigator Flowsheets 05/16/2017  Navigator Location [facility]  Navigator Encounter Type Other/I followed up on James [facility] schedule. Patient needs to be seen on Aug 6th with Dr. Julien Nordmann or APP.  I verified with Dr. Julien Nordmann.  I completed scheduling request.   Abnormal Finding Date 02/21/2017  Confirmed Diagnosis Date 04/24/2017  Multidisiplinary Clinic Date 05/10/2017  Treatment Initiated Date 05/11/2017  Treatment Phase Pre-Tx/Tx Discussion  Barriers/Navigation Needs Coordination of Care  Interventions Coordination of Care  Coordination of Care Appts  Acuity Level 2  Time Spent with Patient 15

## 2017-05-16 NOTE — Telephone Encounter (Signed)
Per response from MM re 1 week f/u per 7/12 los he will see patient 7/25 @ 1:30 pm. Spoke with patient's sister she is aware. Also confirmed 7/23 lab/tx. Mm aware patient has tx 7/23.

## 2017-05-16 NOTE — Telephone Encounter (Signed)
-----   Message from Curt Bears, MD sent at 05/16/2017 10:47 PM EDT ----- No worries. I can refer to him but may better if you do so he can keep you updated about his condition. Thank you. Mohamed ----- Message ----- From: Danie Binder, MD Sent: 05/15/2017   8:42 AM To: Curt Bears, MD  Sorry for the delayed response. I have been on PAL. We do not perform EUS. Please let me know if I need to place the referral to [place].   ----- Message ----- From: Curt Bears, MD Sent: 05/10/2017   4:16 PM To: Danie Binder, MD  Hi Dr. Oneida Alar, Mr. Luis is recently diagnosed with stage III lung cancer and expected to start chemo and radiation soon. He has a pancreatic mass on PET and MRI was not clear in excluding malignancy. He may need EUS and evaluation. Not sure if you would be able to do it in [place] or do you need to refer him to Dr. Ardis Hughs in [place]. Please let me know. Thank you. Julien Nordmann

## 2017-05-16 NOTE — Telephone Encounter (Signed)
T/C from [place] at Dr. Worthy Flank office [phone]). She said Dr. Earlie Server at spoke with Dr. Nona Dell recently about this pt. He has Stage 3 lung cancer. But he spoke to Dr. Oneida Alar about an area in the pancreas of concern. He was under the impression that Dr. Oneida Alar was going to have the pt come in to our office. I told her that Dr.Fields has been on vacation and she will be in the office tomorrow and I will give her the message. She said that will be fine.

## 2017-05-16 NOTE — Progress Notes (Signed)
Oncology Nurse Navigator Documentation  Oncology Nurse Navigator Flowsheets 05/16/2017  Navigator Location [facility]  Navigator Encounter Type Other/I followed up with Dr. Julien Nordmann regarding referral to GI.  He stated he spoke with patient's GI doctor.  I called Dr. Nona Dell office for an update.  I spoke with Dr. Nona Dell nurse and she states she will call me back with an update.   Treatment Phase Pre-Tx/Tx Discussion  Barriers/Navigation Needs Coordination of Care  Interventions Coordination of Care  Coordination of Care Other  Acuity Level 2  Time Spent with Patient 30

## 2017-05-16 NOTE — Telephone Encounter (Signed)
DR. Julien Nordmann WOULD LIKE EUS TO EVALUATE PANCREAS LESION/STAGE III LUNG ADENOCA ASAP. REFER TO DR. DAN JACOBS FOR EUS WITHIN 7-14 DAYS.

## 2017-05-17 DIAGNOSIS — C3432 Malignant neoplasm of lower lobe, left bronchus or lung: Secondary | ICD-10-CM | POA: Diagnosis not present

## 2017-05-17 DIAGNOSIS — I5032 Chronic diastolic (congestive) heart failure: Secondary | ICD-10-CM | POA: Diagnosis not present

## 2017-05-17 DIAGNOSIS — Z9049 Acquired absence of other specified parts of digestive tract: Secondary | ICD-10-CM | POA: Diagnosis not present

## 2017-05-17 DIAGNOSIS — Z510 Encounter for antineoplastic radiation therapy: Secondary | ICD-10-CM | POA: Diagnosis not present

## 2017-05-17 DIAGNOSIS — J449 Chronic obstructive pulmonary disease, unspecified: Secondary | ICD-10-CM | POA: Diagnosis not present

## 2017-05-17 DIAGNOSIS — E669 Obesity, unspecified: Secondary | ICD-10-CM | POA: Diagnosis not present

## 2017-05-17 NOTE — Telephone Encounter (Signed)
REVIEWED-NO ADDITIONAL RECOMMENDATIONS. 

## 2017-05-17 NOTE — Telephone Encounter (Signed)
OK. Sounds good. Thank you.

## 2017-05-17 NOTE — Telephone Encounter (Signed)
  Hilbert Bible; Happy to help.  I am out of town next week and the following week my EUS/MAC time is completely full.  I am certain I can get it done on August 9th and we'll get in touch with him.  Patty, he needs upper EUS, radial +/- linear for lung cancer, pancreatic mass noted on PET 02/2017, MRI 03/31/2017.  Would like to do this on Thursday Aug 9th

## 2017-05-17 NOTE — Telephone Encounter (Signed)
Noted  

## 2017-05-18 ENCOUNTER — Telehealth: Payer: Self-pay | Admitting: Medical Oncology

## 2017-05-18 ENCOUNTER — Other Ambulatory Visit: Payer: Self-pay

## 2017-05-18 ENCOUNTER — Ambulatory Visit
Admission: RE | Admit: 2017-05-18 | Discharge: 2017-05-18 | Disposition: A | Discharge status: Home / Self Care | Payer: Medicare Other | Source: Ambulatory Visit | Attending: Radiation Oncology | Admitting: Radiation Oncology

## 2017-05-18 DIAGNOSIS — E669 Obesity, unspecified: Secondary | ICD-10-CM | POA: Diagnosis not present

## 2017-05-18 DIAGNOSIS — Z510 Encounter for antineoplastic radiation therapy: Secondary | ICD-10-CM | POA: Diagnosis not present

## 2017-05-18 DIAGNOSIS — C229 Malignant neoplasm of liver, not specified as primary or secondary: Secondary | ICD-10-CM

## 2017-05-18 DIAGNOSIS — I5032 Chronic diastolic (congestive) heart failure: Secondary | ICD-10-CM | POA: Diagnosis not present

## 2017-05-18 DIAGNOSIS — K8689 Other specified diseases of pancreas: Secondary | ICD-10-CM

## 2017-05-18 DIAGNOSIS — Z9049 Acquired absence of other specified parts of digestive tract: Secondary | ICD-10-CM | POA: Diagnosis not present

## 2017-05-18 DIAGNOSIS — C3432 Malignant neoplasm of lower lobe, left bronchus or lung: Secondary | ICD-10-CM | POA: Diagnosis not present

## 2017-05-18 DIAGNOSIS — J449 Chronic obstructive pulmonary disease, unspecified: Secondary | ICD-10-CM | POA: Diagnosis not present

## 2017-05-18 NOTE — Telephone Encounter (Signed)
[name] said the endoscopy is scheduled for aug 9th -not on July 23.I confirmed appts at cancer center for July 23.

## 2017-05-18 NOTE — Telephone Encounter (Signed)
EUS scheduled, pt instructed and medications reviewed.  Patient instructions mailed to home.  Patient to call with any questions or concerns.  

## 2017-05-20 NOTE — Progress Notes (Signed)
  Radiation Oncology         [phone]) [phone] ________________________________  Name: CALIXTO PAVEL MRN: [id number]  Date: 05/21/2017  DOB: 1956/02/20  SIMULATION AND TREATMENT PLANNING NOTE    ICD-10-CM   1. Adenocarcinoma of left lung, stage 3 (HCC) C34.32     DIAGNOSIS:  61 y.o. male with Stage III NSCLC, adenocarcinoma of the left lower lung  NARRATIVE:  The patient was brought to set up radiation last week on 7/20 and his tumor appeared to move significantly with respiration.  I requested he return to CT for 4D imaging to assess tumor motion.  On imaging, it seems that either the tumor has increased in the craniocaudad dimension or the organ motion artifact on his initial planning CT happened to clip the middle of the tumor reducing the overall craniocaudad height.  The latter appers more likely.  As a result, the planning target volumes were recreated on the new scan.  ________________________________  Sheral Apley. Tammi Klippel, M.D.

## 2017-05-21 ENCOUNTER — Ambulatory Visit (HOSPITAL_BASED_OUTPATIENT_CLINIC_OR_DEPARTMENT_OTHER): Payer: Medicare Other

## 2017-05-21 ENCOUNTER — Other Ambulatory Visit: Payer: Self-pay | Admitting: Internal Medicine

## 2017-05-21 ENCOUNTER — Ambulatory Visit
Admission: RE | Admit: 2017-05-21 | Discharge: 2017-05-21 | Disposition: A | Discharge status: Home / Self Care | Payer: Medicare Other | Source: Ambulatory Visit | Attending: Radiation Oncology | Admitting: Radiation Oncology

## 2017-05-21 ENCOUNTER — Other Ambulatory Visit (HOSPITAL_BASED_OUTPATIENT_CLINIC_OR_DEPARTMENT_OTHER): Payer: Medicare Other

## 2017-05-21 ENCOUNTER — Other Ambulatory Visit: Payer: Self-pay | Admitting: Medical Oncology

## 2017-05-21 VITALS — BP 123/60 | HR 72 | Temp 98.3°F | Resp 17

## 2017-05-21 DIAGNOSIS — C3432 Malignant neoplasm of lower lobe, left bronchus or lung: Secondary | ICD-10-CM

## 2017-05-21 DIAGNOSIS — I5031 Acute diastolic (congestive) heart failure: Secondary | ICD-10-CM

## 2017-05-21 DIAGNOSIS — Z9049 Acquired absence of other specified parts of digestive tract: Secondary | ICD-10-CM | POA: Diagnosis not present

## 2017-05-21 DIAGNOSIS — E669 Obesity, unspecified: Secondary | ICD-10-CM | POA: Diagnosis not present

## 2017-05-21 DIAGNOSIS — J449 Chronic obstructive pulmonary disease, unspecified: Secondary | ICD-10-CM | POA: Diagnosis not present

## 2017-05-21 DIAGNOSIS — Z510 Encounter for antineoplastic radiation therapy: Secondary | ICD-10-CM | POA: Diagnosis not present

## 2017-05-21 DIAGNOSIS — C3492 Malignant neoplasm of unspecified part of left bronchus or lung: Secondary | ICD-10-CM

## 2017-05-21 DIAGNOSIS — Z5111 Encounter for antineoplastic chemotherapy: Secondary | ICD-10-CM

## 2017-05-21 DIAGNOSIS — I5032 Chronic diastolic (congestive) heart failure: Secondary | ICD-10-CM | POA: Diagnosis not present

## 2017-05-21 LAB — COMPREHENSIVE METABOLIC PANEL
ALT: 11 U/L (ref 0–55)
ANION GAP: 9 meq/L (ref 3–11)
AST: 12 U/L (ref 5–34)
Albumin: 3.4 g/dL — ABNORMAL LOW (ref 3.5–5.0)
Alkaline Phosphatase: 102 U/L (ref 40–150)
BUN: 12.1 mg/dL (ref 7.0–26.0)
CHLORIDE: 105 meq/L (ref 98–109)
CO2: 27 meq/L (ref 22–29)
CREATININE: 1.1 mg/dL (ref 0.7–1.3)
Calcium: 9.3 mg/dL (ref 8.4–10.4)
EGFR: 83 mL/min/{1.73_m2} — ABNORMAL LOW (ref >90)
Glucose: 95 mg/dl (ref 70–140)
POTASSIUM: 4.2 meq/L (ref 3.5–5.1)
Sodium: 141 mEq/L (ref 136–145)
Total Bilirubin: 0.44 mg/dL (ref 0.20–1.20)
Total Protein: 7.7 g/dL (ref 6.4–8.3)

## 2017-05-21 LAB — CBC WITH DIFFERENTIAL/PLATELET
BASO%: 0.5 % (ref 0.0–2.0)
Basophils Absolute: 0 10*3/uL (ref 0.0–0.1)
EOS%: 5.1 % (ref 0.0–7.0)
Eosinophils Absolute: 0.3 10*3/uL (ref 0.0–0.5)
HCT: 42.9 % (ref 38.4–49.9)
HGB: 13.3 g/dL (ref 13.0–17.1)
LYMPH%: 24.2 % (ref 14.0–49.0)
MCH: 26 pg — ABNORMAL LOW (ref 27.2–33.4)
MCHC: 31.1 g/dL — AB (ref 32.0–36.0)
MCV: 83.6 fL (ref 79.3–98.0)
MONO#: 0.5 10*3/uL (ref 0.1–0.9)
MONO%: 8.9 % (ref 0.0–14.0)
NEUT#: 3.2 10*3/uL (ref 1.5–6.5)
NEUT%: 61.3 % (ref 39.0–75.0)
PLATELETS: 227 10*3/uL (ref 140–400)
RBC: 5.13 10*6/uL (ref 4.20–5.82)
RDW: 15.9 % — ABNORMAL HIGH (ref 11.0–14.6)
WBC: 5.1 10*3/uL (ref 4.0–10.3)
lymph#: 1.2 10*3/uL (ref 0.9–3.3)

## 2017-05-21 MED ORDER — SODIUM CHLORIDE 0.9 % IV SOLN
255.6000 mg | Freq: Once | INTRAVENOUS | Status: AC
  Administered 2017-05-21: 260 mg via INTRAVENOUS
  Filled 2017-05-21: qty 26

## 2017-05-21 MED ORDER — SODIUM CHLORIDE 0.9 % IV SOLN
20.0000 mg | Freq: Once | INTRAVENOUS | Status: AC
  Administered 2017-05-21: 20 mg via INTRAVENOUS
  Filled 2017-05-21: qty 2

## 2017-05-21 MED ORDER — DEXTROSE 5 % IV SOLN
45.0000 mg/m2 | Freq: Once | INTRAVENOUS | Status: AC
  Administered 2017-05-21: 96 mg via INTRAVENOUS
  Filled 2017-05-21: qty 16

## 2017-05-21 MED ORDER — SODIUM CHLORIDE 0.9 % IV SOLN
Freq: Once | INTRAVENOUS | Status: AC
  Administered 2017-05-21: 09:00:00 via INTRAVENOUS

## 2017-05-21 MED ORDER — FAMOTIDINE IN NACL 20-0.9 MG/50ML-% IV SOLN
INTRAVENOUS | Status: AC
  Filled 2017-05-21: qty 50

## 2017-05-21 MED ORDER — DIPHENHYDRAMINE HCL 50 MG/ML IJ SOLN
INTRAMUSCULAR | Status: AC
  Filled 2017-05-21: qty 1

## 2017-05-21 MED ORDER — DIPHENHYDRAMINE HCL 50 MG/ML IJ SOLN
50.0000 mg | Freq: Once | INTRAMUSCULAR | Status: AC
  Administered 2017-05-21: 50 mg via INTRAVENOUS

## 2017-05-21 MED ORDER — PALONOSETRON HCL INJECTION 0.25 MG/5ML
INTRAVENOUS | Status: AC
  Filled 2017-05-21: qty 5

## 2017-05-21 MED ORDER — FAMOTIDINE IN NACL 20-0.9 MG/50ML-% IV SOLN
20.0000 mg | Freq: Once | INTRAVENOUS | Status: AC
Start: 2017-05-21 — End: 2017-05-21
  Administered 2017-05-21: 20 mg via INTRAVENOUS

## 2017-05-21 MED ORDER — PALONOSETRON HCL INJECTION 0.25 MG/5ML
0.2500 mg | Freq: Once | INTRAVENOUS | Status: AC
  Administered 2017-05-21: 0.25 mg via INTRAVENOUS

## 2017-05-21 MED ORDER — PROCHLORPERAZINE MALEATE 10 MG PO TABS: 10.0000 mg | ORAL_TABLET | Freq: Four times a day (QID) | ORAL | 0 refills | Status: DC | PRN

## 2017-05-21 NOTE — Patient Instructions (Signed)
[facility] Discharge Instructions for Patients Receiving Chemotherapy  Today you received the following chemotherapy agents Taxol and Carboplatin.  To help prevent nausea and vomiting after your treatment, we encourage you to take your nausea medication as directed - TAKE YOUR COMPAZINE AS NEEDED FOR NAUSEA. CALL CLINIC IF IT IS NOT HELPING!   If you develop nausea and vomiting that is not controlled by your nausea medication, call the clinic.   BELOW ARE SYMPTOMS THAT SHOULD BE REPORTED IMMEDIATELY:  *FEVER GREATER THAN 100.5 F  *CHILLS WITH OR WITHOUT FEVER  NAUSEA AND VOMITING THAT IS NOT CONTROLLED WITH YOUR NAUSEA MEDICATION  *UNUSUAL SHORTNESS OF BREATH  *UNUSUAL BRUISING OR BLEEDING  TENDERNESS IN MOUTH AND THROAT WITH OR WITHOUT PRESENCE OF ULCERS  *URINARY PROBLEMS  *BOWEL PROBLEMS  UNUSUAL RASH Items with * indicate a potential emergency and should be followed up as soon as possible.  Feel free to call the clinic you have any questions or concerns. The clinic phone number is [phone].  Please show the [place] at check-in to the Emergency Department and triage nurse.

## 2017-05-22 ENCOUNTER — Ambulatory Visit: Payer: Medicare Other

## 2017-05-22 ENCOUNTER — Ambulatory Visit: Admission: RE | Admit: 2017-05-22 | Payer: Medicare Other | Source: Ambulatory Visit

## 2017-05-22 DIAGNOSIS — C3432 Malignant neoplasm of lower lobe, left bronchus or lung: Secondary | ICD-10-CM | POA: Diagnosis not present

## 2017-05-22 DIAGNOSIS — Z510 Encounter for antineoplastic radiation therapy: Secondary | ICD-10-CM | POA: Diagnosis not present

## 2017-05-22 DIAGNOSIS — I5032 Chronic diastolic (congestive) heart failure: Secondary | ICD-10-CM | POA: Diagnosis not present

## 2017-05-22 DIAGNOSIS — Z9049 Acquired absence of other specified parts of digestive tract: Secondary | ICD-10-CM | POA: Diagnosis not present

## 2017-05-22 DIAGNOSIS — J449 Chronic obstructive pulmonary disease, unspecified: Secondary | ICD-10-CM | POA: Diagnosis not present

## 2017-05-22 DIAGNOSIS — E669 Obesity, unspecified: Secondary | ICD-10-CM | POA: Diagnosis not present

## 2017-05-23 ENCOUNTER — Ambulatory Visit
Admission: RE | Admit: 2017-05-23 | Discharge: 2017-05-23 | Disposition: A | Discharge status: Home / Self Care | Payer: Medicare Other | Source: Ambulatory Visit | Attending: Radiation Oncology | Admitting: Radiation Oncology

## 2017-05-23 ENCOUNTER — Encounter: Payer: Self-pay | Admitting: Medical Oncology

## 2017-05-23 ENCOUNTER — Encounter: Payer: Self-pay | Admitting: Internal Medicine

## 2017-05-23 ENCOUNTER — Telehealth: Payer: Self-pay | Admitting: Internal Medicine

## 2017-05-23 ENCOUNTER — Ambulatory Visit (HOSPITAL_BASED_OUTPATIENT_CLINIC_OR_DEPARTMENT_OTHER): Payer: Medicare Other | Admitting: Internal Medicine

## 2017-05-23 VITALS — BP 131/66 | HR 100 | Temp 97.7°F | Resp 16 | Ht 62.5 in | Wt 240.2 lb

## 2017-05-23 DIAGNOSIS — Z9049 Acquired absence of other specified parts of digestive tract: Secondary | ICD-10-CM | POA: Diagnosis not present

## 2017-05-23 DIAGNOSIS — J449 Chronic obstructive pulmonary disease, unspecified: Secondary | ICD-10-CM | POA: Diagnosis not present

## 2017-05-23 DIAGNOSIS — K869 Disease of pancreas, unspecified: Secondary | ICD-10-CM

## 2017-05-23 DIAGNOSIS — I5032 Chronic diastolic (congestive) heart failure: Secondary | ICD-10-CM | POA: Diagnosis not present

## 2017-05-23 DIAGNOSIS — Z510 Encounter for antineoplastic radiation therapy: Secondary | ICD-10-CM | POA: Diagnosis not present

## 2017-05-23 DIAGNOSIS — C3432 Malignant neoplasm of lower lobe, left bronchus or lung: Secondary | ICD-10-CM | POA: Diagnosis not present

## 2017-05-23 DIAGNOSIS — C3492 Malignant neoplasm of unspecified part of left bronchus or lung: Secondary | ICD-10-CM

## 2017-05-23 DIAGNOSIS — E669 Obesity, unspecified: Secondary | ICD-10-CM | POA: Diagnosis not present

## 2017-05-23 NOTE — Telephone Encounter (Signed)
Patient already on schedule for weekly lab/tx and q2w f/u requested per 7/25 los thru September

## 2017-05-23 NOTE — Progress Notes (Signed)
[facility] [phone]   Fax:(336) [phone]  OFFICE PROGRESS NOTE  James Beard, MD [address]  [place] [address]  DIAGNOSIS: Stage IIIA (T2a, N2, M0) non-small cell lung cancer, adenocarcinoma presented with left lower lobe lung mass in addition to mediastinal lymphadenopathy diagnosed in June 2018. The patient also has suspicious soft tissue density in the pancreatic head but this has been present for 3 years on previous CT scan and unlikely to be related to his current diagnosis of the lung cancer.  PRIOR THERAPY: None.  CURRENT THERAPY: Concurrent chemoradiation with weekly carboplatin for AUC of 2 and paclitaxel 45 MG/M2. Status post one cycle.  INTERVAL HISTORY: James Zamora 61 y.o. male returns to the clinic today for follow-up visit accompanied by his wife. The patient tolerated the first cycle of his treatment with chemotherapy and radiation fairly well. He denied having any chest pain or hemoptysis. He continues to have mild cough and shortness of breath and he is currently on home oxygen. He has no nausea, vomiting, diarrhea or constipation. He denied having any fever or chills. He has no significant weight loss or night sweats. His MRI of the brain showed no evidence of metastatic disease to the brain. The patient is also scheduled to see Dr. Ardis Hughs on 06/07/2017 for evaluation of the suspicious pancreatic lesion.  MEDICAL HISTORY: Past Medical History:  Diagnosis Date  . Adenocarcinoma of left lung, stage 3 ([place]) 05/10/2017  . Asthma   . Chronic diastolic CHF (congestive heart failure) ([facility] [facility])   . Lung mass   . Non-small cell lung cancer ([facility]) 11/13/2015  . Obesity   . Perforated bowel ([place])     ALLERGIES:  is allergic to bee venom and penicillins.  MEDICATIONS:  Current Outpatient Prescriptions  Medication Sig Dispense Refill  . aspirin EC 81 MG EC tablet Take 1 tablet (81 mg total) by mouth daily. 30 tablet 1  .  fluticasone furoate-vilanterol (BREO ELLIPTA) 200-25 MCG/INH AEPB Inhale 1 puff into the lungs daily. 1 each 5  . furosemide (LASIX) 40 MG tablet Take 3 tablets (120 mg total) by mouth daily. (Patient taking differently: Take 40 mg by mouth daily. ) 90 tablet 6  . OXYGEN Inhale 3 L into the lungs continuous.    . potassium chloride SA (KLOR-CON M20) 20 MEQ tablet Take 1 tablet (20 mEq total) by mouth daily. 30 tablet 6  . prochlorperazine (COMPAZINE) 10 MG tablet Take 1 tablet (10 mg total) by mouth every 6 (six) hours as needed for nausea or vomiting. 30 tablet 0   No current facility-administered medications for this visit.     SURGICAL HISTORY:  Past Surgical History:  Procedure Laterality Date  . APPENDECTOMY    . CHOLECYSTECTOMY    . COLONOSCOPY N/A 07/31/2016   Procedure: COLONOSCOPY;  Surgeon: Danie Binder, MD;  [facility];  Service: Endoscopy;  Laterality: N/A;  1:45 pm  . ESOPHAGOGASTRODUODENOSCOPY N/A 07/31/2016   Procedure: ESOPHAGOGASTRODUODENOSCOPY (EGD);  Surgeon: Danie Binder, MD;  Location: [facility];  Service: Endoscopy;  Laterality: N/A;  . HERNIA REPAIR      REVIEW OF SYSTEMS:  A comprehensive review of systems was negative except for: Respiratory: positive for dyspnea on exertion   PHYSICAL EXAMINATION: General appearance: alert, cooperative, fatigued and no distress Head: Normocephalic, without obvious abnormality, atraumatic Neck: no adenopathy, no JVD, supple, symmetrical, trachea midline and thyroid not enlarged, symmetric, no tenderness/mass/nodules Lymph nodes:  Cervical, supraclavicular, and axillary nodes normal. Resp: clear to auscultation bilaterally Back: symmetric, no curvature. ROM normal. No CVA tenderness. Cardio: regular rate and rhythm, S1, S2 normal, no murmur, click, rub or gallop GI: soft, non-tender; bowel sounds normal; no masses,  no organomegaly Extremities: extremities normal, atraumatic, no cyanosis or edema  ECOG  PERFORMANCE STATUS: 1 - Symptomatic but completely ambulatory  Blood pressure 131/66, pulse 100, temperature 97.7 F (36.5 C), temperature source Oral, resp. rate 16, height 5' 2.5" (1.588 m), weight 240 lb 3.2 oz (109 kg), SpO2 96 %.  LABORATORY DATA: Lab Results  Component Value Date   WBC 5.1 05/21/2017   HGB 13.3 05/21/2017   HCT 42.9 05/21/2017   MCV 83.6 05/21/2017   PLT 227 05/21/2017      Chemistry      Component Value Date/Time   NA 141 05/21/2017 0752   K 4.2 05/21/2017 0752   CL 100 (L) 03/08/2017 1851   CO2 27 05/21/2017 0752   BUN 12.1 05/21/2017 0752   CREATININE 1.1 05/21/2017 0752      Component Value Date/Time   CALCIUM 9.3 05/21/2017 0752   ALKPHOS 102 05/21/2017 0752   AST 12 05/21/2017 0752   ALT 11 05/21/2017 0752   BILITOT 0.44 05/21/2017 0752       RADIOGRAPHIC STUDIES: Mr Jeri Cos IO Contrast  Result Date: 05/17/2017 CLINICAL DATA:  Left lung adenocarcinoma.  Initial staging. EXAM: MRI HEAD WITHOUT AND WITH CONTRAST TECHNIQUE: Multiplanar, multiecho pulse sequences of the brain and surrounding structures were obtained without and with intravenous contrast. CONTRAST:  62mL MULTIHANCE GADOBENATE DIMEGLUMINE 529 MG/ML IV SOLN COMPARISON:  None. FINDINGS: Brain: There is no evidence of acute infarct, intracranial hemorrhage, mass, midline shift, or extra-axial fluid collection. The ventricles are normal in size. A tiny incidental cavum septum pellucidum is noted. Patchy T2 hyperintensities in the cerebral white matter and pons are nonspecific but compatible with mild chronic small vessel ischemic disease. No abnormal enhancement is identified. Vascular: A persistent left trigeminal artery is noted, a normal variant. The left vertebral artery is not well visualized, likely hypoplastic. Skull and upper cervical spine: Unremarkable bone marrow signal. Sinuses/Orbits: Unremarkable orbits. Paranasal sinuses and mastoid air cells are clear. Other: None. IMPRESSION:  1. No evidence of intracranial metastases. 2. Mild chronic small vessel ischemic disease. Electronically Signed   By: Logan Bores M.D.   On: 05/17/2017 08:30   Ct Biopsy  Result Date: 04/24/2017 INDICATION: Left lower lobe PET positive mass EXAM: CT LEFT LOWER LOBE MASS CORE BIOPSY MEDICATIONS: 1% LIDOCAINE LOCALLY ANESTHESIA/SEDATION: Moderate (conscious) sedation was employed during this procedure. A total of Versed 1.0 mg and Fentanyl 25 mcg was administered intravenously. Moderate Sedation Time: 11 minutes. The patient's level of consciousness and vital signs were monitored continuously by radiology nursing throughout the procedure under my direct supervision. FLUOROSCOPY TIME:  Fluoroscopy Time: NONE. COMPLICATIONS: None immediate. PROCEDURE: Informed written consent was obtained from the patient after a thorough discussion of the procedural risks, benefits and alternatives. All questions were addressed. Maximal Sterile Barrier Technique was utilized including caps, mask, sterile gowns, sterile gloves, sterile drape, hand hygiene and skin antiseptic. A timeout was performed prior to the initiation of the procedure. Previous imaging reviewed. Patient position left side down decubitus. Noncontrast localization CT performed. The left lower lobe mass was localized by CT. Overlying skin was marked for a posterior approach. Under sterile conditions and local anesthesia, a 17 gauge 6.8 cm coaxial guide needle was advanced to the  left lower lobe lesion. Needle position confirmed within the medial aspect of the mass. 2 18 gauge core biopsies obtained. Samples were intact and non fragmented. These were placed the formalin. Needle tract embolized with the Biosentry occlusion device. Postprocedure imaging demonstrates no effusion or pneumothorax. Patient tolerated the biopsy well. IMPRESSION: Successful CT-guided core biopsy of the left lower lobe mass. Electronically Signed   By: Jerilynn Mages.  Shick M.D.   On: 04/24/2017 11:16    Dg Chest Port 1 View  Result Date: 04/24/2017 CLINICAL DATA:  Status post biopsy of the left lung nodule under CT earlier today. EXAM: PORTABLE CHEST 1 VIEW COMPARISON:  Chest x-ray of Mar 08, 2017 and CT scan of the chest of today's date FINDINGS: The lungs are adequately inflated. There is no pneumothorax or pleural effusion. The interstitial markings are coarse. The heart and pulmonary vascularity are normal. IMPRESSION: There is no immediate postprocedure complication following CT directed left lung biopsy. Electronically Signed   By: David  Martinique M.D.   On: 04/24/2017 12:17    ASSESSMENT AND PLAN:  This is a very pleasant 61 years old African-American male recently diagnosed with a stage IIIa non-small cell lung cancer, adenocarcinoma presented with left lower lobe lung mass in addition to mediastinal lymphadenopathy. The patient is currently undergoing a course of concurrent chemoradiation with weekly carboplatin and paclitaxel. Status post one cycle. He tolerated the first dose of his chemotherapy fairly well was no significant adverse effects. I recommended for the patient to continue his treatment as a scheduled and I will see him back for follow-up visit in 2 weeks for evaluation and management of any adverse effect of his treatment. For the suspicious pancreatic mass, the patient has a scheduled to see Dr. Ardis Hughs on 06/07/2017 for further evaluation of this lesion. The patient was advised to call immediately if he has any concerning symptoms in the interval. The patient voices understanding of current disease status and treatment options and is in agreement with the current care plan. All questions were answered. The patient knows to call the clinic with any problems, questions or concerns. We can certainly see the patient much sooner if necessary. I spent 10 minutes counseling the patient face to face. The total time spent in the appointment was 15 minutes.  Disclaimer: This note was  dictated with voice recognition software. Similar sounding words can inadvertently be transcribed and may not be corrected upon review.

## 2017-05-24 ENCOUNTER — Ambulatory Visit
Admission: RE | Admit: 2017-05-24 | Discharge: 2017-05-24 | Disposition: A | Discharge status: Home / Self Care | Payer: Medicare Other | Source: Ambulatory Visit | Attending: Radiation Oncology | Admitting: Radiation Oncology

## 2017-05-24 DIAGNOSIS — E669 Obesity, unspecified: Secondary | ICD-10-CM | POA: Diagnosis not present

## 2017-05-24 DIAGNOSIS — C3432 Malignant neoplasm of lower lobe, left bronchus or lung: Secondary | ICD-10-CM | POA: Diagnosis not present

## 2017-05-24 DIAGNOSIS — I5032 Chronic diastolic (congestive) heart failure: Secondary | ICD-10-CM | POA: Diagnosis not present

## 2017-05-24 DIAGNOSIS — J449 Chronic obstructive pulmonary disease, unspecified: Secondary | ICD-10-CM | POA: Diagnosis not present

## 2017-05-24 DIAGNOSIS — Z9049 Acquired absence of other specified parts of digestive tract: Secondary | ICD-10-CM | POA: Diagnosis not present

## 2017-05-24 DIAGNOSIS — Z510 Encounter for antineoplastic radiation therapy: Secondary | ICD-10-CM | POA: Diagnosis not present

## 2017-05-25 ENCOUNTER — Ambulatory Visit
Admission: RE | Admit: 2017-05-25 | Discharge: 2017-05-25 | Disposition: A | Discharge status: Home / Self Care | Payer: Medicare Other | Source: Ambulatory Visit | Attending: Radiation Oncology | Admitting: Radiation Oncology

## 2017-05-25 DIAGNOSIS — J449 Chronic obstructive pulmonary disease, unspecified: Secondary | ICD-10-CM | POA: Diagnosis not present

## 2017-05-25 DIAGNOSIS — E669 Obesity, unspecified: Secondary | ICD-10-CM | POA: Diagnosis not present

## 2017-05-25 DIAGNOSIS — Z9049 Acquired absence of other specified parts of digestive tract: Secondary | ICD-10-CM | POA: Diagnosis not present

## 2017-05-25 DIAGNOSIS — I5032 Chronic diastolic (congestive) heart failure: Secondary | ICD-10-CM | POA: Diagnosis not present

## 2017-05-25 DIAGNOSIS — C3432 Malignant neoplasm of lower lobe, left bronchus or lung: Secondary | ICD-10-CM | POA: Diagnosis not present

## 2017-05-25 DIAGNOSIS — Z510 Encounter for antineoplastic radiation therapy: Secondary | ICD-10-CM | POA: Diagnosis not present

## 2017-05-28 ENCOUNTER — Other Ambulatory Visit (HOSPITAL_BASED_OUTPATIENT_CLINIC_OR_DEPARTMENT_OTHER): Payer: Medicare Other

## 2017-05-28 ENCOUNTER — Ambulatory Visit (HOSPITAL_BASED_OUTPATIENT_CLINIC_OR_DEPARTMENT_OTHER): Payer: Medicare Other

## 2017-05-28 ENCOUNTER — Ambulatory Visit
Admission: RE | Admit: 2017-05-28 | Discharge: 2017-05-28 | Disposition: A | Discharge status: Home / Self Care | Payer: Medicare Other | Source: Ambulatory Visit | Attending: Radiation Oncology | Admitting: Radiation Oncology

## 2017-05-28 VITALS — BP 131/74 | HR 83 | Temp 97.8°F | Resp 20

## 2017-05-28 DIAGNOSIS — I5032 Chronic diastolic (congestive) heart failure: Secondary | ICD-10-CM | POA: Diagnosis not present

## 2017-05-28 DIAGNOSIS — E669 Obesity, unspecified: Secondary | ICD-10-CM | POA: Diagnosis not present

## 2017-05-28 DIAGNOSIS — Z5111 Encounter for antineoplastic chemotherapy: Secondary | ICD-10-CM

## 2017-05-28 DIAGNOSIS — C3432 Malignant neoplasm of lower lobe, left bronchus or lung: Secondary | ICD-10-CM | POA: Diagnosis not present

## 2017-05-28 DIAGNOSIS — Z510 Encounter for antineoplastic radiation therapy: Secondary | ICD-10-CM | POA: Diagnosis not present

## 2017-05-28 DIAGNOSIS — Z9049 Acquired absence of other specified parts of digestive tract: Secondary | ICD-10-CM | POA: Diagnosis not present

## 2017-05-28 DIAGNOSIS — C3492 Malignant neoplasm of unspecified part of left bronchus or lung: Secondary | ICD-10-CM

## 2017-05-28 DIAGNOSIS — J449 Chronic obstructive pulmonary disease, unspecified: Secondary | ICD-10-CM | POA: Diagnosis not present

## 2017-05-28 LAB — COMPREHENSIVE METABOLIC PANEL
ALBUMIN: 3.3 g/dL — AB (ref 3.5–5.0)
ALK PHOS: 89 U/L (ref 40–150)
ALT: 9 U/L (ref 0–55)
AST: 14 U/L (ref 5–34)
Anion Gap: 5 mEq/L (ref 3–11)
BUN: 12.4 mg/dL (ref 7.0–26.0)
CALCIUM: 9.2 mg/dL (ref 8.4–10.4)
CHLORIDE: 102 meq/L (ref 98–109)
CO2: 28 mEq/L (ref 22–29)
Creatinine: 1 mg/dL (ref 0.7–1.3)
EGFR: >90 mL/min/{1.73_m2} (ref >90)
Glucose: 85 mg/dl (ref 70–140)
POTASSIUM: 4.8 meq/L (ref 3.5–5.1)
Sodium: 136 mEq/L (ref 136–145)
Total Bilirubin: 0.44 mg/dL (ref 0.20–1.20)
Total Protein: 7.2 g/dL (ref 6.4–8.3)

## 2017-05-28 LAB — CBC WITH DIFFERENTIAL/PLATELET
BASO%: 0.3 % (ref 0.0–2.0)
BASOS ABS: 0 10*3/uL (ref 0.0–0.1)
EOS ABS: 0.2 10*3/uL (ref 0.0–0.5)
EOS%: 4.5 % (ref 0.0–7.0)
HEMATOCRIT: 41.2 % (ref 38.4–49.9)
HEMOGLOBIN: 12.7 g/dL — AB (ref 13.0–17.1)
LYMPH#: 0.9 10*3/uL (ref 0.9–3.3)
LYMPH%: 22.6 % (ref 14.0–49.0)
MCH: 26.1 pg — AB (ref 27.2–33.4)
MCHC: 30.8 g/dL — AB (ref 32.0–36.0)
MCV: 84.8 fL (ref 79.3–98.0)
MONO#: 0.2 10*3/uL (ref 0.1–0.9)
MONO%: 6 % (ref 0.0–14.0)
NEUT#: 2.5 10*3/uL (ref 1.5–6.5)
NEUT%: 66.6 % (ref 39.0–75.0)
Platelets: 228 10*3/uL (ref 140–400)
RBC: 4.86 10*6/uL (ref 4.20–5.82)
RDW: 14.6 % (ref 11.0–14.6)
WBC: 3.8 10*3/uL — ABNORMAL LOW (ref 4.0–10.3)

## 2017-05-28 MED ORDER — DIPHENHYDRAMINE HCL 50 MG/ML IJ SOLN
50.0000 mg | Freq: Once | INTRAMUSCULAR | Status: AC
  Administered 2017-05-28: 50 mg via INTRAVENOUS

## 2017-05-28 MED ORDER — PALONOSETRON HCL INJECTION 0.25 MG/5ML
0.2500 mg | Freq: Once | INTRAVENOUS | Status: AC
  Administered 2017-05-28: 0.25 mg via INTRAVENOUS

## 2017-05-28 MED ORDER — SODIUM CHLORIDE 0.9 % IV SOLN
20.0000 mg | Freq: Once | INTRAVENOUS | Status: AC
  Administered 2017-05-28: 20 mg via INTRAVENOUS
  Filled 2017-05-28: qty 2

## 2017-05-28 MED ORDER — PACLITAXEL CHEMO INJECTION 300 MG/50ML
45.0000 mg/m2 | Freq: Once | INTRAVENOUS | Status: AC
  Administered 2017-05-28: 96 mg via INTRAVENOUS
  Filled 2017-05-28: qty 16

## 2017-05-28 MED ORDER — SODIUM CHLORIDE 0.9 % IV SOLN
255.6000 mg | Freq: Once | INTRAVENOUS | Status: AC
  Administered 2017-05-28: 260 mg via INTRAVENOUS
  Filled 2017-05-28: qty 26

## 2017-05-28 MED ORDER — PALONOSETRON HCL INJECTION 0.25 MG/5ML
INTRAVENOUS | Status: AC
  Filled 2017-05-28: qty 5

## 2017-05-28 MED ORDER — SODIUM CHLORIDE 0.9 % IV SOLN
Freq: Once | INTRAVENOUS | Status: AC
  Administered 2017-05-28: 12:00:00 via INTRAVENOUS

## 2017-05-28 MED ORDER — RADIAPLEXRX EX GEL
Freq: Once | CUTANEOUS | Status: AC
  Administered 2017-05-28: 11:00:00 via TOPICAL

## 2017-05-28 MED ORDER — FAMOTIDINE IN NACL 20-0.9 MG/50ML-% IV SOLN
20.0000 mg | Freq: Once | INTRAVENOUS | Status: AC
Start: 2017-05-28 — End: 2017-05-28
  Administered 2017-05-28: 20 mg via INTRAVENOUS

## 2017-05-28 MED ORDER — DIPHENHYDRAMINE HCL 50 MG/ML IJ SOLN
INTRAMUSCULAR | Status: AC
Start: 2017-05-28 — End: 2017-05-28
  Filled 2017-05-28: qty 1

## 2017-05-28 MED ORDER — FAMOTIDINE IN NACL 20-0.9 MG/50ML-% IV SOLN
INTRAVENOUS | Status: AC
  Filled 2017-05-28: qty 50

## 2017-05-28 NOTE — Patient Instructions (Signed)
[facility] Discharge Instructions for Patients Receiving Chemotherapy  Today you received the following chemotherapy agents Taxol and Carboplatin.  To help prevent nausea and vomiting after your treatment, we encourage you to take your nausea medication as directed - TAKE YOUR COMPAZINE AS NEEDED FOR NAUSEA. CALL CLINIC IF IT IS NOT HELPING!   If you develop nausea and vomiting that is not controlled by your nausea medication, call the clinic.   BELOW ARE SYMPTOMS THAT SHOULD BE REPORTED IMMEDIATELY:  *FEVER GREATER THAN 100.5 F  *CHILLS WITH OR WITHOUT FEVER  NAUSEA AND VOMITING THAT IS NOT CONTROLLED WITH YOUR NAUSEA MEDICATION  *UNUSUAL SHORTNESS OF BREATH  *UNUSUAL BRUISING OR BLEEDING  TENDERNESS IN MOUTH AND THROAT WITH OR WITHOUT PRESENCE OF ULCERS  *URINARY PROBLEMS  *BOWEL PROBLEMS  UNUSUAL RASH Items with * indicate a potential emergency and should be followed up as soon as possible.  Feel free to call the clinic you have any questions or concerns. The clinic phone number is [phone].  Please show the [facility] at check-in to the Emergency Department and triage nurse.

## 2017-05-29 ENCOUNTER — Ambulatory Visit
Admission: RE | Admit: 2017-05-29 | Discharge: 2017-05-29 | Disposition: A | Discharge status: Home / Self Care | Payer: Medicare Other | Source: Ambulatory Visit | Attending: Radiation Oncology | Admitting: Radiation Oncology

## 2017-05-29 DIAGNOSIS — J449 Chronic obstructive pulmonary disease, unspecified: Secondary | ICD-10-CM | POA: Diagnosis not present

## 2017-05-29 DIAGNOSIS — Z9049 Acquired absence of other specified parts of digestive tract: Secondary | ICD-10-CM | POA: Diagnosis not present

## 2017-05-29 DIAGNOSIS — Z510 Encounter for antineoplastic radiation therapy: Secondary | ICD-10-CM | POA: Diagnosis not present

## 2017-05-29 DIAGNOSIS — E669 Obesity, unspecified: Secondary | ICD-10-CM | POA: Diagnosis not present

## 2017-05-29 DIAGNOSIS — C3432 Malignant neoplasm of lower lobe, left bronchus or lung: Secondary | ICD-10-CM | POA: Diagnosis not present

## 2017-05-29 DIAGNOSIS — I5032 Chronic diastolic (congestive) heart failure: Secondary | ICD-10-CM | POA: Diagnosis not present

## 2017-05-30 ENCOUNTER — Ambulatory Visit
Admission: RE | Admit: 2017-05-30 | Discharge: 2017-05-30 | Disposition: A | Discharge status: Home / Self Care | Payer: Medicare Other | Source: Ambulatory Visit | Attending: Radiation Oncology | Admitting: Radiation Oncology

## 2017-05-30 DIAGNOSIS — J449 Chronic obstructive pulmonary disease, unspecified: Secondary | ICD-10-CM | POA: Diagnosis not present

## 2017-05-30 DIAGNOSIS — I5032 Chronic diastolic (congestive) heart failure: Secondary | ICD-10-CM | POA: Diagnosis not present

## 2017-05-30 DIAGNOSIS — Z9049 Acquired absence of other specified parts of digestive tract: Secondary | ICD-10-CM | POA: Diagnosis not present

## 2017-05-30 DIAGNOSIS — E669 Obesity, unspecified: Secondary | ICD-10-CM | POA: Diagnosis not present

## 2017-05-30 DIAGNOSIS — C3432 Malignant neoplasm of lower lobe, left bronchus or lung: Secondary | ICD-10-CM | POA: Diagnosis not present

## 2017-05-30 DIAGNOSIS — Z510 Encounter for antineoplastic radiation therapy: Secondary | ICD-10-CM | POA: Diagnosis not present

## 2017-05-31 ENCOUNTER — Ambulatory Visit
Admission: RE | Admit: 2017-05-31 | Discharge: 2017-05-31 | Disposition: A | Discharge status: Home / Self Care | Payer: Medicare Other | Source: Ambulatory Visit | Attending: Radiation Oncology | Admitting: Radiation Oncology

## 2017-05-31 DIAGNOSIS — C3432 Malignant neoplasm of lower lobe, left bronchus or lung: Secondary | ICD-10-CM | POA: Diagnosis not present

## 2017-05-31 DIAGNOSIS — E669 Obesity, unspecified: Secondary | ICD-10-CM | POA: Diagnosis not present

## 2017-05-31 DIAGNOSIS — Z9049 Acquired absence of other specified parts of digestive tract: Secondary | ICD-10-CM | POA: Diagnosis not present

## 2017-05-31 DIAGNOSIS — I5032 Chronic diastolic (congestive) heart failure: Secondary | ICD-10-CM | POA: Diagnosis not present

## 2017-05-31 DIAGNOSIS — Z510 Encounter for antineoplastic radiation therapy: Secondary | ICD-10-CM | POA: Diagnosis not present

## 2017-05-31 DIAGNOSIS — J449 Chronic obstructive pulmonary disease, unspecified: Secondary | ICD-10-CM | POA: Diagnosis not present

## 2017-06-01 ENCOUNTER — Ambulatory Visit
Admission: RE | Admit: 2017-06-01 | Discharge: 2017-06-01 | Disposition: A | Discharge status: Home / Self Care | Payer: Medicare Other | Source: Ambulatory Visit | Attending: Radiation Oncology | Admitting: Radiation Oncology

## 2017-06-01 DIAGNOSIS — E669 Obesity, unspecified: Secondary | ICD-10-CM | POA: Diagnosis not present

## 2017-06-01 DIAGNOSIS — C3432 Malignant neoplasm of lower lobe, left bronchus or lung: Secondary | ICD-10-CM | POA: Diagnosis not present

## 2017-06-01 DIAGNOSIS — Z510 Encounter for antineoplastic radiation therapy: Secondary | ICD-10-CM | POA: Diagnosis not present

## 2017-06-01 DIAGNOSIS — I5032 Chronic diastolic (congestive) heart failure: Secondary | ICD-10-CM | POA: Diagnosis not present

## 2017-06-01 DIAGNOSIS — J449 Chronic obstructive pulmonary disease, unspecified: Secondary | ICD-10-CM | POA: Diagnosis not present

## 2017-06-01 DIAGNOSIS — Z9049 Acquired absence of other specified parts of digestive tract: Secondary | ICD-10-CM | POA: Diagnosis not present

## 2017-06-04 ENCOUNTER — Telehealth: Payer: Self-pay | Admitting: Internal Medicine

## 2017-06-04 ENCOUNTER — Other Ambulatory Visit: Payer: Medicare Other

## 2017-06-04 ENCOUNTER — Encounter: Payer: Self-pay | Admitting: Internal Medicine

## 2017-06-04 ENCOUNTER — Ambulatory Visit (HOSPITAL_BASED_OUTPATIENT_CLINIC_OR_DEPARTMENT_OTHER): Payer: Medicare Other | Admitting: Internal Medicine

## 2017-06-04 ENCOUNTER — Ambulatory Visit (HOSPITAL_BASED_OUTPATIENT_CLINIC_OR_DEPARTMENT_OTHER): Payer: Medicare Other

## 2017-06-04 ENCOUNTER — Ambulatory Visit
Admission: RE | Admit: 2017-06-04 | Discharge: 2017-06-04 | Disposition: A | Discharge status: Home / Self Care | Payer: Medicare Other | Source: Ambulatory Visit | Attending: Radiation Oncology | Admitting: Radiation Oncology

## 2017-06-04 ENCOUNTER — Other Ambulatory Visit (HOSPITAL_BASED_OUTPATIENT_CLINIC_OR_DEPARTMENT_OTHER): Payer: Medicare Other

## 2017-06-04 ENCOUNTER — Ambulatory Visit: Payer: Medicare Other

## 2017-06-04 VITALS — BP 124/78 | HR 97 | Temp 97.9°F | Resp 16 | Ht 62.5 in | Wt 232.3 lb

## 2017-06-04 DIAGNOSIS — Z5111 Encounter for antineoplastic chemotherapy: Secondary | ICD-10-CM

## 2017-06-04 DIAGNOSIS — C3492 Malignant neoplasm of unspecified part of left bronchus or lung: Secondary | ICD-10-CM

## 2017-06-04 DIAGNOSIS — C3432 Malignant neoplasm of lower lobe, left bronchus or lung: Secondary | ICD-10-CM

## 2017-06-04 DIAGNOSIS — E669 Obesity, unspecified: Secondary | ICD-10-CM | POA: Diagnosis not present

## 2017-06-04 DIAGNOSIS — K869 Disease of pancreas, unspecified: Secondary | ICD-10-CM | POA: Diagnosis not present

## 2017-06-04 DIAGNOSIS — Z510 Encounter for antineoplastic radiation therapy: Secondary | ICD-10-CM | POA: Diagnosis not present

## 2017-06-04 DIAGNOSIS — Z9049 Acquired absence of other specified parts of digestive tract: Secondary | ICD-10-CM | POA: Diagnosis not present

## 2017-06-04 DIAGNOSIS — I5032 Chronic diastolic (congestive) heart failure: Secondary | ICD-10-CM | POA: Diagnosis not present

## 2017-06-04 DIAGNOSIS — J449 Chronic obstructive pulmonary disease, unspecified: Secondary | ICD-10-CM | POA: Diagnosis not present

## 2017-06-04 LAB — COMPREHENSIVE METABOLIC PANEL
ALBUMIN: 3.6 g/dL (ref 3.5–5.0)
ALK PHOS: 91 U/L (ref 40–150)
ALT: 13 U/L (ref 0–55)
AST: 15 U/L (ref 5–34)
Anion Gap: 7 mEq/L (ref 3–11)
BUN: 13.5 mg/dL (ref 7.0–26.0)
CHLORIDE: 101 meq/L (ref 98–109)
CO2: 30 meq/L — AB (ref 22–29)
Calcium: 9.5 mg/dL (ref 8.4–10.4)
Creatinine: 1.1 mg/dL (ref 0.7–1.3)
EGFR: 83 mL/min/{1.73_m2} — ABNORMAL LOW (ref >90)
GLUCOSE: 86 mg/dL (ref 70–140)
POTASSIUM: 4.6 meq/L (ref 3.5–5.1)
SODIUM: 139 meq/L (ref 136–145)
Total Bilirubin: 0.46 mg/dL (ref 0.20–1.20)
Total Protein: 7.7 g/dL (ref 6.4–8.3)

## 2017-06-04 LAB — CBC WITH DIFFERENTIAL/PLATELET
BASO%: 0.2 % (ref 0.0–2.0)
BASOS ABS: 0 10*3/uL (ref 0.0–0.1)
EOS ABS: 0.1 10*3/uL (ref 0.0–0.5)
EOS%: 1 % (ref 0.0–7.0)
HCT: 43.5 % (ref 38.4–49.9)
HEMOGLOBIN: 13.4 g/dL (ref 13.0–17.1)
LYMPH%: 14.8 % (ref 14.0–49.0)
MCH: 26.2 pg — AB (ref 27.2–33.4)
MCHC: 30.8 g/dL — AB (ref 32.0–36.0)
MCV: 85.1 fL (ref 79.3–98.0)
MONO#: 0.4 10*3/uL (ref 0.1–0.9)
MONO%: 7.1 % (ref 0.0–14.0)
NEUT#: 4 10*3/uL (ref 1.5–6.5)
NEUT%: 76.9 % — AB (ref 39.0–75.0)
Platelets: 253 10*3/uL (ref 140–400)
RBC: 5.11 10*6/uL (ref 4.20–5.82)
RDW: 15.4 % — AB (ref 11.0–14.6)
WBC: 5.2 10*3/uL (ref 4.0–10.3)
lymph#: 0.8 10*3/uL — ABNORMAL LOW (ref 0.9–3.3)

## 2017-06-04 MED ORDER — PACLITAXEL CHEMO INJECTION 300 MG/50ML
45.0000 mg/m2 | Freq: Once | INTRAVENOUS | Status: AC
  Administered 2017-06-04: 96 mg via INTRAVENOUS
  Filled 2017-06-04: qty 16

## 2017-06-04 MED ORDER — PALONOSETRON HCL INJECTION 0.25 MG/5ML
0.2500 mg | Freq: Once | INTRAVENOUS | Status: AC
Start: 2017-06-04 — End: 2017-06-04
  Administered 2017-06-04: 0.25 mg via INTRAVENOUS

## 2017-06-04 MED ORDER — DEXAMETHASONE SODIUM PHOSPHATE 100 MG/10ML IJ SOLN
20.0000 mg | Freq: Once | INTRAMUSCULAR | Status: AC
  Administered 2017-06-04: 20 mg via INTRAVENOUS
  Filled 2017-06-04: qty 2

## 2017-06-04 MED ORDER — PALONOSETRON HCL INJECTION 0.25 MG/5ML
INTRAVENOUS | Status: AC
  Filled 2017-06-04: qty 5

## 2017-06-04 MED ORDER — SODIUM CHLORIDE 0.9 % IV SOLN
255.6000 mg | Freq: Once | INTRAVENOUS | Status: AC
  Administered 2017-06-04: 260 mg via INTRAVENOUS
  Filled 2017-06-04: qty 26

## 2017-06-04 MED ORDER — FAMOTIDINE IN NACL 20-0.9 MG/50ML-% IV SOLN
20.0000 mg | Freq: Once | INTRAVENOUS | Status: AC
  Administered 2017-06-04: 20 mg via INTRAVENOUS

## 2017-06-04 MED ORDER — FAMOTIDINE IN NACL 20-0.9 MG/50ML-% IV SOLN
INTRAVENOUS | Status: AC
  Filled 2017-06-04: qty 50

## 2017-06-04 MED ORDER — SODIUM CHLORIDE 0.9 % IV SOLN
Freq: Once | INTRAVENOUS | Status: AC
  Administered 2017-06-04: 11:00:00 via INTRAVENOUS

## 2017-06-04 MED ORDER — DIPHENHYDRAMINE HCL 50 MG/ML IJ SOLN
50.0000 mg | Freq: Once | INTRAMUSCULAR | Status: AC
  Administered 2017-06-04: 50 mg via INTRAVENOUS

## 2017-06-04 MED ORDER — DIPHENHYDRAMINE HCL 50 MG/ML IJ SOLN
INTRAMUSCULAR | Status: AC
  Filled 2017-06-04: qty 1

## 2017-06-04 NOTE — Telephone Encounter (Signed)
Gave avs report and calendars for Aug and Sept

## 2017-06-04 NOTE — Progress Notes (Signed)
[facility] Telephone:(336) [phone]   Fax:(336) [phone]  OFFICE PROGRESS NOTE  [name], MD [address] [place] [address]  DIAGNOSIS: Stage IIIA (T2a, N2, M0) non-small cell lung cancer, adenocarcinoma presented with left lower lobe lung mass in addition to mediastinal lymphadenopathy diagnosed in June 2018. The patient also has suspicious soft tissue density in the pancreatic head but this has been present for 3 years on previous CT scan and unlikely to be related to his current diagnosis of the lung cancer.  PRIOR THERAPY: None.  CURRENT THERAPY: Concurrent chemoradiation with weekly carboplatin for AUC of 2 and paclitaxel 45 MG/M2. Status post 2 cycles.  INTERVAL HISTORY: James Zamora 61 y.o. male returns to the clinic today for follow-up visit accompanied by his daughter. The patient is feeling fine today with no specific complaints. He continues to have the baseline shortness of breath and he is currently on home oxygen. He is tolerating his current concurrent chemoradiation fairly well. He has no nausea, vomiting, diarrhea or constipation. He has no weight loss or night sweats. He is today for evaluation before starting cycle #3.  MEDICAL HISTORY: Past Medical History:  Diagnosis Date  . Adenocarcinoma of left lung, stage 3 ([facility]) 05/10/2017  . Asthma   . Chronic diastolic CHF (congestive heart failure) ([facility])   . Lung mass   . Non-small cell lung cancer ([facility]) 11/13/2015  . Obesity   . Perforated bowel ([place])     ALLERGIES:  is allergic to bee venom and penicillins.  MEDICATIONS:  Current Outpatient Prescriptions  Medication Sig Dispense Refill  . aspirin EC 81 MG EC tablet Take 1 tablet (81 mg total) by mouth daily. 30 tablet 1  . fluticasone furoate-vilanterol (BREO ELLIPTA) 200-25 MCG/INH AEPB Inhale 1 puff into the lungs daily. 1 each 5  . furosemide (LASIX) 40 MG tablet Take 3 tablets (120 mg total) by mouth daily. (Patient taking  differently: Take 20 mg by mouth daily. ) 90 tablet 6  . OXYGEN Inhale 3 L into the lungs continuous.    . potassium chloride SA (KLOR-CON M20) 20 MEQ tablet Take 1 tablet (20 mEq total) by mouth daily. 30 tablet 6  . prochlorperazine (COMPAZINE) 10 MG tablet Take 1 tablet (10 mg total) by mouth every 6 (six) hours as needed for nausea or vomiting. 30 tablet 0  . Wound Cleansers (RADIAPLEX EX) Apply 1 application topically at bedtime.     No current facility-administered medications for this visit.     SURGICAL HISTORY:  Past Surgical History:  Procedure Laterality Date  . APPENDECTOMY    . CHOLECYSTECTOMY    . COLONOSCOPY N/A 07/31/2016   Procedure: COLONOSCOPY;  Surgeon: Danie Binder, MD;  [facility];  Service: Endoscopy;  Laterality: N/A;  1:45 pm  . ESOPHAGOGASTRODUODENOSCOPY N/A 07/31/2016   Procedure: ESOPHAGOGASTRODUODENOSCOPY (EGD);  Surgeon: Danie Binder, MD;  [facility];  Service: Endoscopy;  Laterality: N/A;  . HERNIA REPAIR      REVIEW OF SYSTEMS:  A comprehensive review of systems was negative except for: Respiratory: positive for dyspnea on exertion   PHYSICAL EXAMINATION: General appearance: alert, cooperative, fatigued and no distress Head: Normocephalic, without obvious abnormality, atraumatic Neck: no adenopathy, no JVD, supple, symmetrical, trachea midline and thyroid not enlarged, symmetric, no tenderness/mass/nodules Lymph nodes: Cervical, supraclavicular, and axillary nodes normal. Resp: clear to auscultation bilaterally Back: symmetric, no curvature. ROM normal. No CVA tenderness. Cardio: regular  rate and rhythm, S1, S2 normal, no murmur, click, rub or gallop GI: soft, non-tender; bowel sounds normal; no masses,  no organomegaly Extremities: extremities normal, atraumatic, no cyanosis or edema  ECOG PERFORMANCE STATUS: 1 - Symptomatic but completely ambulatory  Blood pressure 124/78, pulse 97, temperature 97.9 F (36.6 C),  temperature source Oral, resp. rate 16, height 5' 2.5" (1.588 m), weight 232 lb 4.8 oz (105.4 kg), SpO2 90 %.  LABORATORY DATA: Lab Results  Component Value Date   WBC 5.2 06/04/2017   HGB 13.4 06/04/2017   HCT 43.5 06/04/2017   MCV 85.1 06/04/2017   PLT 253 06/04/2017      Chemistry      Component Value Date/Time   NA 136 05/28/2017 1108   K 4.8 05/28/2017 1108   CL 100 (L) 03/08/2017 1851   CO2 28 05/28/2017 1108   BUN 12.4 05/28/2017 1108   CREATININE 1.0 05/28/2017 1108      Component Value Date/Time   CALCIUM 9.2 05/28/2017 1108   ALKPHOS 89 05/28/2017 1108   AST 14 05/28/2017 1108   ALT 9 05/28/2017 1108   BILITOT 0.44 05/28/2017 1108       RADIOGRAPHIC STUDIES: Mr Jeri Cos QM Contrast  Result Date: 05/17/2017 CLINICAL DATA:  Left lung adenocarcinoma.  Initial staging. EXAM: MRI HEAD WITHOUT AND WITH CONTRAST TECHNIQUE: Multiplanar, multiecho pulse sequences of the brain and surrounding structures were obtained without and with intravenous contrast. CONTRAST:  30mL MULTIHANCE GADOBENATE DIMEGLUMINE 529 MG/ML IV SOLN COMPARISON:  None. FINDINGS: Brain: There is no evidence of acute infarct, intracranial hemorrhage, mass, midline shift, or extra-axial fluid collection. The ventricles are normal in size. A tiny incidental cavum septum pellucidum is noted. Patchy T2 hyperintensities in the cerebral white matter and pons are nonspecific but compatible with mild chronic small vessel ischemic disease. No abnormal enhancement is identified. Vascular: A persistent left trigeminal artery is noted, a normal variant. The left vertebral artery is not well visualized, likely hypoplastic. Skull and upper cervical spine: Unremarkable bone marrow signal. Sinuses/Orbits: Unremarkable orbits. Paranasal sinuses and mastoid air cells are clear. Other: None. IMPRESSION: 1. No evidence of intracranial metastases. 2. Mild chronic small vessel ischemic disease. Electronically Signed   By: Logan Bores  M.D.   On: 05/17/2017 08:30    ASSESSMENT AND PLAN:  This is a very pleasant 61 years old African-American male recently diagnosed with a stage IIIa non-small cell lung cancer, adenocarcinoma presented with left lower lobe lung mass in addition to mediastinal lymphadenopathy. The patient is currently undergoing a course of concurrent chemoradiation with weekly carboplatin and paclitaxel. Status post 2 cycles.  The patient is tolerating the treatment well. I recommended for him to proceed with cycle #3 today as scheduled. I will see him back for follow-up visit in 2 weeks for evaluation before starting cycle #5. He was advised to call immediately if he has any concerning symptoms in the interval. For the suspicious pancreatic mass, the patient has a scheduled to see Dr. Ardis Hughs on 06/07/2017 for further evaluation of this lesion. The patient voices understanding of current disease status and treatment options and is in agreement with the current care plan. All questions were answered. The patient knows to call the clinic with any problems, questions or concerns. We can certainly see the patient much sooner if necessary. I spent 10 minutes counseling the patient face to face. The total time spent in the appointment was 15 minutes.  Disclaimer: This note was dictated with voice recognition  software. Similar sounding words can inadvertently be transcribed and may not be corrected upon review.

## 2017-06-04 NOTE — Patient Instructions (Signed)
[facility] Discharge Instructions for Patients Receiving Chemotherapy  Today you received the following chemotherapy agents Taxol and Carboplatin.  To help prevent nausea and vomiting after your treatment, we encourage you to take your nausea medication as directed - TAKE YOUR COMPAZINE AS NEEDED FOR NAUSEA. CALL CLINIC IF IT IS NOT HELPING!   If you develop nausea and vomiting that is not controlled by your nausea medication, call the clinic.   BELOW ARE SYMPTOMS THAT SHOULD BE REPORTED IMMEDIATELY:  *FEVER GREATER THAN 100.5 F  *CHILLS WITH OR WITHOUT FEVER  NAUSEA AND VOMITING THAT IS NOT CONTROLLED WITH YOUR NAUSEA MEDICATION  *UNUSUAL SHORTNESS OF BREATH  *UNUSUAL BRUISING OR BLEEDING  TENDERNESS IN MOUTH AND THROAT WITH OR WITHOUT PRESENCE OF ULCERS  *URINARY PROBLEMS  *BOWEL PROBLEMS  UNUSUAL RASH Items with * indicate a potential emergency and should be followed up as soon as possible.  Feel free to call the clinic you have any questions or concerns. The clinic phone number is [phone].  Please show the [facility] at check-in to the Emergency Department and triage nurse.

## 2017-06-05 ENCOUNTER — Ambulatory Visit
Admission: RE | Admit: 2017-06-05 | Discharge: 2017-06-05 | Disposition: A | Discharge status: Home / Self Care | Payer: Medicare Other | Source: Ambulatory Visit | Attending: Radiation Oncology | Admitting: Radiation Oncology

## 2017-06-05 DIAGNOSIS — C3432 Malignant neoplasm of lower lobe, left bronchus or lung: Secondary | ICD-10-CM | POA: Diagnosis not present

## 2017-06-05 DIAGNOSIS — Z9049 Acquired absence of other specified parts of digestive tract: Secondary | ICD-10-CM | POA: Diagnosis not present

## 2017-06-05 DIAGNOSIS — J449 Chronic obstructive pulmonary disease, unspecified: Secondary | ICD-10-CM | POA: Diagnosis not present

## 2017-06-05 DIAGNOSIS — E669 Obesity, unspecified: Secondary | ICD-10-CM | POA: Diagnosis not present

## 2017-06-05 DIAGNOSIS — I5032 Chronic diastolic (congestive) heart failure: Secondary | ICD-10-CM | POA: Diagnosis not present

## 2017-06-05 DIAGNOSIS — Z510 Encounter for antineoplastic radiation therapy: Secondary | ICD-10-CM | POA: Diagnosis not present

## 2017-06-06 ENCOUNTER — Ambulatory Visit
Admission: RE | Admit: 2017-06-06 | Discharge: 2017-06-06 | Disposition: A | Discharge status: Home / Self Care | Payer: Medicare Other | Source: Ambulatory Visit | Attending: Radiation Oncology | Admitting: Radiation Oncology

## 2017-06-06 DIAGNOSIS — E669 Obesity, unspecified: Secondary | ICD-10-CM | POA: Diagnosis not present

## 2017-06-06 DIAGNOSIS — J449 Chronic obstructive pulmonary disease, unspecified: Secondary | ICD-10-CM | POA: Diagnosis not present

## 2017-06-06 DIAGNOSIS — Z9049 Acquired absence of other specified parts of digestive tract: Secondary | ICD-10-CM | POA: Diagnosis not present

## 2017-06-06 DIAGNOSIS — Z510 Encounter for antineoplastic radiation therapy: Secondary | ICD-10-CM | POA: Diagnosis not present

## 2017-06-06 DIAGNOSIS — I5032 Chronic diastolic (congestive) heart failure: Secondary | ICD-10-CM | POA: Diagnosis not present

## 2017-06-06 DIAGNOSIS — C3432 Malignant neoplasm of lower lobe, left bronchus or lung: Secondary | ICD-10-CM | POA: Diagnosis not present

## 2017-06-06 NOTE — Anesthesia Preprocedure Evaluation (Addendum)
Anesthesia Evaluation  Patient identified by MRN, date of birth, ID band Patient awake    Reviewed: Allergy & Precautions, NPO status , Patient's Chart, lab work & pertinent test results  Airway Mallampati: II  TM Distance: >3 FB Neck ROM: Full    Dental  (+) Edentulous Upper, Edentulous Lower   Pulmonary asthma , COPD, former smoker,  Lung ca   breath sounds clear to auscultation       Cardiovascular +CHF   Rhythm:Regular Rate:Normal     Neuro/Psych    GI/Hepatic Liver ca Liver ca , pancreas mass   Endo/Other    Renal/GU Renal InsufficiencyRenal disease     Musculoskeletal   Abdominal (+) + obese,   Peds  Hematology   Anesthesia Other Findings   Reproductive/Obstetrics                            Anesthesia Physical Anesthesia Plan  ASA: III  Anesthesia Plan: MAC   Post-op Pain Management:    Induction: Intravenous  PONV Risk Score and Plan: 1 and Ondansetron and Dexamethasone  Airway Management Planned: Natural Airway and Nasal Cannula  Additional Equipment:   Intra-op Plan:   Post-operative Plan:   Informed Consent: I have reviewed the patients History and Physical, chart, labs and discussed the procedure including the risks, benefits and alternatives for the proposed anesthesia with the patient or authorized representative who has indicated his/her understanding and acceptance.     Plan Discussed with: CRNA  Anesthesia Plan Comments:        Anesthesia Quick Evaluation

## 2017-06-07 ENCOUNTER — Encounter (HOSPITAL_COMMUNITY)
Admission: RE | Disposition: A | Discharge status: Home / Self Care | Payer: Self-pay | Source: Ambulatory Visit | Attending: Gastroenterology

## 2017-06-07 ENCOUNTER — Ambulatory Visit (HOSPITAL_COMMUNITY)
Admission: RE | Admit: 2017-06-07 | Discharge: 2017-06-07 | Disposition: A | Discharge status: Home / Self Care | Payer: Medicare Other | Source: Ambulatory Visit | Attending: Gastroenterology | Admitting: Gastroenterology

## 2017-06-07 ENCOUNTER — Ambulatory Visit
Admission: RE | Admit: 2017-06-07 | Discharge: 2017-06-07 | Disposition: A | Discharge status: Home / Self Care | Payer: Medicare Other | Source: Ambulatory Visit | Attending: Radiation Oncology | Admitting: Radiation Oncology

## 2017-06-07 ENCOUNTER — Ambulatory Visit (HOSPITAL_COMMUNITY): Payer: Medicare Other | Admitting: Anesthesiology

## 2017-06-07 ENCOUNTER — Encounter (HOSPITAL_COMMUNITY): Payer: Self-pay | Admitting: Certified Registered"

## 2017-06-07 DIAGNOSIS — Z7982 Long term (current) use of aspirin: Secondary | ICD-10-CM | POA: Diagnosis not present

## 2017-06-07 DIAGNOSIS — Z87891 Personal history of nicotine dependence: Secondary | ICD-10-CM | POA: Diagnosis not present

## 2017-06-07 DIAGNOSIS — E669 Obesity, unspecified: Secondary | ICD-10-CM | POA: Insufficient documentation

## 2017-06-07 DIAGNOSIS — Z9981 Dependence on supplemental oxygen: Secondary | ICD-10-CM | POA: Diagnosis not present

## 2017-06-07 DIAGNOSIS — I5032 Chronic diastolic (congestive) heart failure: Secondary | ICD-10-CM | POA: Diagnosis not present

## 2017-06-07 DIAGNOSIS — Z91030 Bee allergy status: Secondary | ICD-10-CM | POA: Diagnosis not present

## 2017-06-07 DIAGNOSIS — K8689 Other specified diseases of pancreas: Secondary | ICD-10-CM | POA: Diagnosis not present

## 2017-06-07 DIAGNOSIS — J449 Chronic obstructive pulmonary disease, unspecified: Secondary | ICD-10-CM | POA: Diagnosis not present

## 2017-06-07 DIAGNOSIS — D649 Anemia, unspecified: Secondary | ICD-10-CM | POA: Diagnosis not present

## 2017-06-07 DIAGNOSIS — J45909 Unspecified asthma, uncomplicated: Secondary | ICD-10-CM | POA: Diagnosis not present

## 2017-06-07 DIAGNOSIS — C3490 Malignant neoplasm of unspecified part of unspecified bronchus or lung: Secondary | ICD-10-CM | POA: Diagnosis not present

## 2017-06-07 DIAGNOSIS — Z79899 Other long term (current) drug therapy: Secondary | ICD-10-CM | POA: Insufficient documentation

## 2017-06-07 DIAGNOSIS — Z6836 Body mass index (BMI) 36.0-36.9, adult: Secondary | ICD-10-CM | POA: Insufficient documentation

## 2017-06-07 DIAGNOSIS — Z9049 Acquired absence of other specified parts of digestive tract: Secondary | ICD-10-CM | POA: Diagnosis not present

## 2017-06-07 DIAGNOSIS — C3432 Malignant neoplasm of lower lobe, left bronchus or lung: Secondary | ICD-10-CM | POA: Diagnosis not present

## 2017-06-07 DIAGNOSIS — R933 Abnormal findings on diagnostic imaging of other parts of digestive tract: Secondary | ICD-10-CM

## 2017-06-07 DIAGNOSIS — Z880 Allergy status to penicillin: Secondary | ICD-10-CM | POA: Insufficient documentation

## 2017-06-07 DIAGNOSIS — Z510 Encounter for antineoplastic radiation therapy: Secondary | ICD-10-CM | POA: Diagnosis not present

## 2017-06-07 DIAGNOSIS — C3492 Malignant neoplasm of unspecified part of left bronchus or lung: Secondary | ICD-10-CM | POA: Diagnosis present

## 2017-06-07 DIAGNOSIS — K869 Disease of pancreas, unspecified: Secondary | ICD-10-CM | POA: Insufficient documentation

## 2017-06-07 DIAGNOSIS — I509 Heart failure, unspecified: Secondary | ICD-10-CM | POA: Insufficient documentation

## 2017-06-07 DIAGNOSIS — N179 Acute kidney failure, unspecified: Secondary | ICD-10-CM | POA: Diagnosis not present

## 2017-06-07 DIAGNOSIS — C229 Malignant neoplasm of liver, not specified as primary or secondary: Secondary | ICD-10-CM

## 2017-06-07 HISTORY — PX: EUS: SHX5427

## 2017-06-07 SURGERY — UPPER ENDOSCOPIC ULTRASOUND (EUS) LINEAR
Anesthesia: Monitor Anesthesia Care

## 2017-06-07 MED ORDER — PROPOFOL 10 MG/ML IV BOLUS
INTRAVENOUS | Status: AC
  Filled 2017-06-07: qty 40

## 2017-06-07 MED ORDER — PROPOFOL 10 MG/ML IV BOLUS
INTRAVENOUS | Status: DC | PRN
  Administered 2017-06-07 (×12): 20 mg via INTRAVENOUS

## 2017-06-07 MED ORDER — LIDOCAINE 2% (20 MG/ML) 5 ML SYRINGE
INTRAMUSCULAR | Status: DC | PRN
  Administered 2017-06-07: 40 mg via INTRAVENOUS

## 2017-06-07 MED ORDER — SUCCINYLCHOLINE CHLORIDE 200 MG/10ML IV SOSY
PREFILLED_SYRINGE | INTRAVENOUS | Status: AC
  Filled 2017-06-07: qty 20

## 2017-06-07 MED ORDER — ONDANSETRON HCL 4 MG/2ML IJ SOLN
INTRAMUSCULAR | Status: AC
  Filled 2017-06-07: qty 2

## 2017-06-07 MED ORDER — LIDOCAINE 2% (20 MG/ML) 5 ML SYRINGE
INTRAMUSCULAR | Status: AC
  Filled 2017-06-07: qty 20

## 2017-06-07 MED ORDER — SODIUM CHLORIDE 0.9 % IV SOLN: INTRAVENOUS | Status: DC

## 2017-06-07 MED ORDER — EPHEDRINE 5 MG/ML INJ
INTRAVENOUS | Status: AC
  Filled 2017-06-07: qty 20

## 2017-06-07 MED ORDER — LACTATED RINGERS IV SOLN
INTRAVENOUS | Status: DC
  Administered 2017-06-07 (×2): via INTRAVENOUS

## 2017-06-07 MED ORDER — ROCURONIUM BROMIDE 50 MG/5ML IV SOSY
PREFILLED_SYRINGE | INTRAVENOUS | Status: AC
  Filled 2017-06-07: qty 10

## 2017-06-07 NOTE — Interval H&P Note (Signed)
History and Physical Interval Note:  06/07/2017 8:49 AM  James Zamora  has presented today for surgery, with the diagnosis of liver cancer, pancratic mass   The various methods of treatment have been discussed with the patient and family. After consideration of risks, benefits and other options for treatment, the patient has consented to  Procedure(s): UPPER ENDOSCOPIC ULTRASOUND (EUS) LINEAR (N/A) as a surgical intervention .  The patient's history has been reviewed, patient examined, no change in status, stable for surgery.  I have reviewed the patient's chart and labs.  Questions were answered to the patient's satisfaction.     Milus Banister

## 2017-06-07 NOTE — Op Note (Signed)
[facility] Patient Name: James Zamora Procedure Date: 06/07/2017 MRN: [id number] Attending MD: Milus Banister , MD Date of Birth: 1956-03-26 CSN: [id number] Age: 61 Admit Type: Outpatient Procedure:                Upper EUS Indications:              Stage IIIA (T2a, N2, M0) non-small cell lung                            cancer, adenocarcinoma presented with left lower                            lobe lung mass in addition to mediastinal                            lymphadenopathy diagnosed in June 2018; also PET                            avid region in head of pancreas; MRI 2018 shows                            "Soft tissue fullness identified in the head of the                            pancreas on recent PET-CT appears similar to prior                            abdomen and pelvis CT scans dating back to                            12/31/2009." Providers:                Milus Banister, MD, Zenon Mayo, RN, Cletis Athens, Technician Referring MD:             Curt Bears, MD Medicines:                Monitored Anesthesia Care Complications:            No immediate complications. Estimated blood loss:                            None. Estimated Blood Loss:     Estimated blood loss: none. Procedure:                Pre-Anesthesia Assessment:                           - Prior to the procedure, a History and Physical                            was performed, and patient medications and  allergies were reviewed. The patient's tolerance of                            previous anesthesia was also reviewed. The risks                            and benefits of the procedure and the sedation                            options and risks were discussed with the patient.                            All questions were answered, and informed consent                            was obtained. Prior Anticoagulants: The patient has                             taken no previous anticoagulant or antiplatelet                            agents. ASA Grade Assessment: III - A patient with                            severe systemic disease. After reviewing the risks                            and benefits, the patient was deemed in                            satisfactory condition to undergo the procedure.                           After obtaining informed consent, the endoscope was                            passed under direct vision. Throughout the                            procedure, the patient's blood pressure, pulse, and                            oxygen saturations were monitored continuously. The                            [id number] ([id number]) scope was introduced through                            the mouth, and advanced to the second part of                            duodenum. The [id number] ([id number]) scope was  introduced through the mouth, and advanced to the                            second part of duodenum. The upper EUS was                            accomplished without difficulty. The patient                            tolerated the procedure well. Scope In: Scope Out: Findings:      Endoscopic Finding :      The UGI tract was normal; every good evaluation of the major papilla       with side viewing duodenoscope was normal as well.      Endosonographic Finding (using radial and linear echoendoscopes): :      1. The pancreatic parenchyma was normal; there were no discrete masses       or signs of chronic pancreatitis.      2. Main pancreatic duct was normal; non-dilated      3. CBD was normal      4. No peripancreatic adenopathy      5. Limited views of the liver, spleen, portal and splenic vessels were       all normal Impression:               - The PET avid 'mass' in head of pancreas was not                            visible on this examination (using radial and                             linear echoendoscopes and side viewing duodenoscope                            evaluation). Recent MRI also showed no clear mass                            but described that the "soft tissue fullness                            identified in the head of the pancreas on recent                            PET-CT appears similar to prior abdomen and pelvis                            CT scans dating back to 12/31/2009."                           - Observe clinically and I recommend repeat MRI of                            pancreas in 6-12 months if there is no planned  dedicated imaging that would view that region while                            getting routine scans for lung cancer follow up. Moderate Sedation:      N/A- Per Anesthesia Care Recommendation:           - Discharge patient to home (ambulatory). Procedure Code(s):        --- Professional ---                           [phone], Esophagogastroduodenoscopy, flexible,                            transoral; with endoscopic ultrasound examination                            limited to the esophagus, stomach or duodenum, and                            adjacent structures Diagnosis Code(s):        --- Professional ---                           R93.3, Abnormal findings on diagnostic imaging of                            other parts of digestive tract CPT copyright 2016 [employer] Association. All rights reserved. The codes documented in this report are preliminary and upon coder review may  be revised to meet current compliance requirements. Milus Banister, MD 06/07/2017 9:42:55 AM This report has been signed electronically. Number of Addenda: 0

## 2017-06-07 NOTE — Discharge Instructions (Signed)
YOU HAD AN ENDOSCOPIC PROCEDURE TODAY: Refer to the procedure report that was given to you for any specific questions about what was found during the examination.  If the procedure report does not answer your questions, please call your gastroenterologist to clarify.  YOU SHOULD EXPECT: Some feelings of bloating in the abdomen. Passage of more gas than usual.  Walking can help get rid of the air that was put into your GI tract during the procedure and reduce the bloating. If you had a lower endoscopy (such as a colonoscopy or flexible sigmoidoscopy) you may notice spotting of blood in your stool or on the toilet paper.   DIET: Your first meal following the procedure should be a light meal and then it is ok to progress to your normal diet.  A half-sandwich or bowl of soup is an example of a good first meal.  Heavy or fried foods are harder to digest and may make you feel nasueas or bloated.  Drink plenty of fluids but you should avoid alcoholic beverages for 24 hours.  ACTIVITY: Your care partner should take you home directly after the procedure.  You should plan to take it easy, moving slowly for the rest of the day.  You can resume normal activity the day after the procedure however you should NOT DRIVE or use heavy machinery for 24 hours (because of the sedation medicines used during the test).    SYMPTOMS TO REPORT IMMEDIATELY  A gastroenterologist can be reached at any hour.  Please call your doctor's office for any of the following symptoms:   Following upper endoscopy (EGD, EUS, ERCP)  Vomiting of blood or coffee ground material  New, significant abdominal pain  New, significant chest pain or pain under the shoulder blades  Painful or persistently difficult swallowing  New shortness of breath  Black, tarry-looking stools  FOLLOW UP: If any biopsies were taken you will be contacted by phone or by letter within the next 1-3 weeks.  Call your gastroenterologist if you have not heard about the  biopsies in 3 weeks.  Please also call your gastroenterologist's office with any specific questions about appointments or follow up tests.

## 2017-06-07 NOTE — Anesthesia Procedure Notes (Signed)
Date/Time: 06/07/2017 9:08 AM Performed by: Cynda Familia Oxygen Delivery Method: Nasal cannula Airway Equipment and Method: Bite block (by GI tech) Placement Confirmation: breath sounds checked- equal and bilateral and positive ETCO2 Dental Injury: Teeth and Oropharynx as per pre-operative assessment

## 2017-06-07 NOTE — H&P (View-Only) (Signed)
[facility] Telephone:(336) [phone]   Fax:(336) [phone]  OFFICE PROGRESS NOTE  [name], MD [address] [place] [address]  DIAGNOSIS: Stage IIIA (T2a, N2, M0) non-small cell lung cancer, adenocarcinoma presented with left lower lobe lung mass in addition to mediastinal lymphadenopathy diagnosed in June 2018. The patient also has suspicious soft tissue density in the pancreatic head but this has been present for 3 years on previous CT scan and unlikely to be related to his current diagnosis of the lung cancer.  PRIOR THERAPY: None.  CURRENT THERAPY: Concurrent chemoradiation with weekly carboplatin for AUC of 2 and paclitaxel 45 MG/M2. Status post 2 cycles.  INTERVAL HISTORY: James Zamora 61 y.o. male returns to the clinic today for follow-up visit accompanied by his daughter. The patient is feeling fine today with no specific complaints. He continues to have the baseline shortness of breath and he is currently on home oxygen. He is tolerating his current concurrent chemoradiation fairly well. He has no nausea, vomiting, diarrhea or constipation. He has no weight loss or night sweats. He is today for evaluation before starting cycle #3.  MEDICAL HISTORY: Past Medical History:  Diagnosis Date  . Adenocarcinoma of left lung, stage 3 ([facility]) 05/10/2017  . Asthma   . Chronic diastolic CHF (congestive heart failure) ([place])   . Lung mass   . Non-small cell lung cancer ([place]) 11/13/2015  . Obesity   . Perforated bowel ([place])     ALLERGIES:  is allergic to bee venom and penicillins.  MEDICATIONS:  Current Outpatient Prescriptions  Medication Sig Dispense Refill  . aspirin EC 81 MG EC tablet Take 1 tablet (81 mg total) by mouth daily. 30 tablet 1  . fluticasone furoate-vilanterol (BREO ELLIPTA) 200-25 MCG/INH AEPB Inhale 1 puff into the lungs daily. 1 each 5  . furosemide (LASIX) 40 MG tablet Take 3 tablets (120 mg total) by mouth daily. (Patient taking  differently: Take 20 mg by mouth daily. ) 90 tablet 6  . OXYGEN Inhale 3 L into the lungs continuous.    . potassium chloride SA (KLOR-CON M20) 20 MEQ tablet Take 1 tablet (20 mEq total) by mouth daily. 30 tablet 6  . prochlorperazine (COMPAZINE) 10 MG tablet Take 1 tablet (10 mg total) by mouth every 6 (six) hours as needed for nausea or vomiting. 30 tablet 0  . Wound Cleansers (RADIAPLEX EX) Apply 1 application topically at bedtime.     No current facility-administered medications for this visit.     SURGICAL HISTORY:  Past Surgical History:  Procedure Laterality Date  . APPENDECTOMY    . CHOLECYSTECTOMY    . COLONOSCOPY N/A 07/31/2016   Procedure: COLONOSCOPY;  Surgeon: Danie Binder, MD;  Location: [facility];  Service: Endoscopy;  Laterality: N/A;  1:45 pm  . ESOPHAGOGASTRODUODENOSCOPY N/A 07/31/2016   Procedure: ESOPHAGOGASTRODUODENOSCOPY (EGD);  Surgeon: Danie Binder, MD;  Location: [facility];  Service: Endoscopy;  Laterality: N/A;  . HERNIA REPAIR      REVIEW OF SYSTEMS:  A comprehensive review of systems was negative except for: Respiratory: positive for dyspnea on exertion   PHYSICAL EXAMINATION: General appearance: alert, cooperative, fatigued and no distress Head: Normocephalic, without obvious abnormality, atraumatic Neck: no adenopathy, no JVD, supple, symmetrical, trachea midline and thyroid not enlarged, symmetric, no tenderness/mass/nodules Lymph nodes: Cervical, supraclavicular, and axillary nodes normal. Resp: clear to auscultation bilaterally Back: symmetric, no curvature. ROM normal. No CVA tenderness. Cardio: regular  rate and rhythm, S1, S2 normal, no murmur, click, rub or gallop GI: soft, non-tender; bowel sounds normal; no masses,  no organomegaly Extremities: extremities normal, atraumatic, no cyanosis or edema  ECOG PERFORMANCE STATUS: 1 - Symptomatic but completely ambulatory  Blood pressure 124/78, pulse 97, temperature 97.9 F (36.6 C),  temperature source Oral, resp. rate 16, height 5' 2.5" (1.588 m), weight 232 lb 4.8 oz (105.4 kg), SpO2 90 %.  LABORATORY DATA: Lab Results  Component Value Date   WBC 5.2 06/04/2017   HGB 13.4 06/04/2017   HCT 43.5 06/04/2017   MCV 85.1 06/04/2017   PLT 253 06/04/2017      Chemistry      Component Value Date/Time   NA 136 05/28/2017 1108   K 4.8 05/28/2017 1108   CL 100 (L) 03/08/2017 1851   CO2 28 05/28/2017 1108   BUN 12.4 05/28/2017 1108   CREATININE 1.0 05/28/2017 1108      Component Value Date/Time   CALCIUM 9.2 05/28/2017 1108   ALKPHOS 89 05/28/2017 1108   AST 14 05/28/2017 1108   ALT 9 05/28/2017 1108   BILITOT 0.44 05/28/2017 1108       RADIOGRAPHIC STUDIES: Mr Jeri Cos UM Contrast  Result Date: 05/17/2017 CLINICAL DATA:  Left lung adenocarcinoma.  Initial staging. EXAM: MRI HEAD WITHOUT AND WITH CONTRAST TECHNIQUE: Multiplanar, multiecho pulse sequences of the brain and surrounding structures were obtained without and with intravenous contrast. CONTRAST:  68mL MULTIHANCE GADOBENATE DIMEGLUMINE 529 MG/ML IV SOLN COMPARISON:  None. FINDINGS: Brain: There is no evidence of acute infarct, intracranial hemorrhage, mass, midline shift, or extra-axial fluid collection. The ventricles are normal in size. A tiny incidental cavum septum pellucidum is noted. Patchy T2 hyperintensities in the cerebral white matter and pons are nonspecific but compatible with mild chronic small vessel ischemic disease. No abnormal enhancement is identified. Vascular: A persistent left trigeminal artery is noted, a normal variant. The left vertebral artery is not well visualized, likely hypoplastic. Skull and upper cervical spine: Unremarkable bone marrow signal. Sinuses/Orbits: Unremarkable orbits. Paranasal sinuses and mastoid air cells are clear. Other: None. IMPRESSION: 1. No evidence of intracranial metastases. 2. Mild chronic small vessel ischemic disease. Electronically Signed   By: Logan Bores  M.D.   On: 05/17/2017 08:30    ASSESSMENT AND PLAN:  This is a very pleasant 61 years old African-American male recently diagnosed with a stage IIIa non-small cell lung cancer, adenocarcinoma presented with left lower lobe lung mass in addition to mediastinal lymphadenopathy. The patient is currently undergoing a course of concurrent chemoradiation with weekly carboplatin and paclitaxel. Status post 2 cycles.  The patient is tolerating the treatment well. I recommended for him to proceed with cycle #3 today as scheduled. I will see him back for follow-up visit in 2 weeks for evaluation before starting cycle #5. He was advised to call immediately if he has any concerning symptoms in the interval. For the suspicious pancreatic mass, the patient has a scheduled to see Dr. Ardis Hughs on 06/07/2017 for further evaluation of this lesion. The patient voices understanding of current disease status and treatment options and is in agreement with the current care plan. All questions were answered. The patient knows to call the clinic with any problems, questions or concerns. We can certainly see the patient much sooner if necessary. I spent 10 minutes counseling the patient face to face. The total time spent in the appointment was 15 minutes.  Disclaimer: This note was dictated with voice recognition  software. Similar sounding words can inadvertently be transcribed and may not be corrected upon review.

## 2017-06-07 NOTE — Transfer of Care (Signed)
Immediate Anesthesia Transfer of Care Note  Patient: James Zamora  Procedure(s) Performed: Procedure(s): UPPER ENDOSCOPIC ULTRASOUND (EUS) LINEAR (N/A)  Patient Location: PACU and Endoscopy Unit  Anesthesia Type:MAC  Level of Consciousness: sedated  Airway & Oxygen Therapy: Patient Spontanous Breathing and Patient connected to nasal cannula oxygen  Post-op Assessment: Report given to RN and Post -op Vital signs reviewed and stable  Post vital signs: Reviewed and stable  Last Vitals:  Vitals:   06/07/17 0846  BP: 130/67  Pulse: 88  Temp: 36.4 C  SpO2: 98%    Last Pain:  Vitals:   06/07/17 0846  TempSrc: Oral         Complications: No apparent anesthesia complications

## 2017-06-07 NOTE — Anesthesia Postprocedure Evaluation (Signed)
Anesthesia Post Note  Patient: James Zamora  Procedure(s) Performed: Procedure(s) (LRB): UPPER ENDOSCOPIC ULTRASOUND (EUS) LINEAR (N/A)     Patient location during evaluation: Endoscopy Anesthesia Type: MAC Level of consciousness: awake and alert Pain management: pain level controlled Vital Signs Assessment: post-procedure vital signs reviewed and stable Respiratory status: spontaneous breathing, nonlabored ventilation, respiratory function stable and patient connected to nasal cannula oxygen Cardiovascular status: stable and blood pressure returned to baseline Anesthetic complications: no    Last Vitals:  Vitals:   06/07/17 0950 06/07/17 1000  BP: 110/62 123/74  Pulse: 91 80  Resp: 17 19  Temp:    SpO2: 92% 95%    Last Pain:  Vitals:   06/07/17 0938  TempSrc: Oral                 [name],JAMES TERRILL

## 2017-06-08 ENCOUNTER — Ambulatory Visit
Admission: RE | Admit: 2017-06-08 | Discharge: 2017-06-08 | Disposition: A | Discharge status: Home / Self Care | Payer: Medicare Other | Source: Ambulatory Visit | Attending: Radiation Oncology | Admitting: Radiation Oncology

## 2017-06-10 ENCOUNTER — Encounter (HOSPITAL_COMMUNITY): Payer: Self-pay | Admitting: Gastroenterology

## 2017-06-11 ENCOUNTER — Other Ambulatory Visit: Payer: Medicare Other

## 2017-06-11 ENCOUNTER — Ambulatory Visit: Payer: Medicare Other | Admitting: Gastroenterology

## 2017-06-11 ENCOUNTER — Ambulatory Visit: Payer: Medicare Other

## 2017-06-11 ENCOUNTER — Ambulatory Visit
Admission: RE | Admit: 2017-06-11 | Discharge: 2017-06-11 | Disposition: A | Discharge status: Home / Self Care | Payer: Medicare Other | Source: Ambulatory Visit | Attending: Radiation Oncology | Admitting: Radiation Oncology

## 2017-06-11 DIAGNOSIS — I5032 Chronic diastolic (congestive) heart failure: Secondary | ICD-10-CM | POA: Diagnosis not present

## 2017-06-11 DIAGNOSIS — J449 Chronic obstructive pulmonary disease, unspecified: Secondary | ICD-10-CM | POA: Diagnosis not present

## 2017-06-11 DIAGNOSIS — E669 Obesity, unspecified: Secondary | ICD-10-CM | POA: Diagnosis not present

## 2017-06-11 DIAGNOSIS — Z510 Encounter for antineoplastic radiation therapy: Secondary | ICD-10-CM | POA: Diagnosis not present

## 2017-06-11 DIAGNOSIS — C3432 Malignant neoplasm of lower lobe, left bronchus or lung: Secondary | ICD-10-CM | POA: Diagnosis not present

## 2017-06-11 DIAGNOSIS — Z9049 Acquired absence of other specified parts of digestive tract: Secondary | ICD-10-CM | POA: Diagnosis not present

## 2017-06-12 ENCOUNTER — Ambulatory Visit
Admission: RE | Admit: 2017-06-12 | Discharge: 2017-06-12 | Disposition: A | Discharge status: Home / Self Care | Payer: Medicare Other | Source: Ambulatory Visit | Attending: Radiation Oncology | Admitting: Radiation Oncology

## 2017-06-12 ENCOUNTER — Ambulatory Visit (HOSPITAL_BASED_OUTPATIENT_CLINIC_OR_DEPARTMENT_OTHER): Payer: Medicare Other

## 2017-06-12 ENCOUNTER — Other Ambulatory Visit (HOSPITAL_BASED_OUTPATIENT_CLINIC_OR_DEPARTMENT_OTHER): Payer: Medicare Other

## 2017-06-12 VITALS — BP 115/77 | HR 98 | Temp 97.8°F | Resp 19

## 2017-06-12 DIAGNOSIS — Z5111 Encounter for antineoplastic chemotherapy: Secondary | ICD-10-CM

## 2017-06-12 DIAGNOSIS — I5032 Chronic diastolic (congestive) heart failure: Secondary | ICD-10-CM | POA: Diagnosis not present

## 2017-06-12 DIAGNOSIS — Z9049 Acquired absence of other specified parts of digestive tract: Secondary | ICD-10-CM | POA: Diagnosis not present

## 2017-06-12 DIAGNOSIS — Z510 Encounter for antineoplastic radiation therapy: Secondary | ICD-10-CM | POA: Diagnosis not present

## 2017-06-12 DIAGNOSIS — E669 Obesity, unspecified: Secondary | ICD-10-CM | POA: Diagnosis not present

## 2017-06-12 DIAGNOSIS — C3432 Malignant neoplasm of lower lobe, left bronchus or lung: Secondary | ICD-10-CM

## 2017-06-12 DIAGNOSIS — J449 Chronic obstructive pulmonary disease, unspecified: Secondary | ICD-10-CM | POA: Diagnosis not present

## 2017-06-12 DIAGNOSIS — C3492 Malignant neoplasm of unspecified part of left bronchus or lung: Secondary | ICD-10-CM

## 2017-06-12 LAB — COMPREHENSIVE METABOLIC PANEL
ALBUMIN: 3.5 g/dL (ref 3.5–5.0)
ALK PHOS: 88 U/L (ref 40–150)
ALT: 14 U/L (ref 0–55)
ANION GAP: 8 meq/L (ref 3–11)
AST: 17 U/L (ref 5–34)
BILIRUBIN TOTAL: 0.64 mg/dL (ref 0.20–1.20)
BUN: 14.3 mg/dL (ref 7.0–26.0)
CO2: 27 mEq/L (ref 22–29)
Calcium: 9.5 mg/dL (ref 8.4–10.4)
Chloride: 101 mEq/L (ref 98–109)
Creatinine: 1.1 mg/dL (ref 0.7–1.3)
EGFR: 87 mL/min/{1.73_m2} — AB (ref >90)
Glucose: 94 mg/dl (ref 70–140)
POTASSIUM: 4.2 meq/L (ref 3.5–5.1)
Sodium: 136 mEq/L (ref 136–145)
TOTAL PROTEIN: 7.6 g/dL (ref 6.4–8.3)

## 2017-06-12 LAB — CBC WITH DIFFERENTIAL/PLATELET
BASO%: 1 % (ref 0.0–2.0)
Basophils Absolute: 0 10*3/uL (ref 0.0–0.1)
EOS ABS: 0 10*3/uL (ref 0.0–0.5)
EOS%: 1 % (ref 0.0–7.0)
HEMATOCRIT: 41.7 % (ref 38.4–49.9)
HEMOGLOBIN: 13.2 g/dL (ref 13.0–17.1)
LYMPH#: 0.5 10*3/uL — AB (ref 0.9–3.3)
LYMPH%: 19.4 % (ref 14.0–49.0)
MCH: 26.4 pg — ABNORMAL LOW (ref 27.2–33.4)
MCHC: 31.8 g/dL — ABNORMAL LOW (ref 32.0–36.0)
MCV: 83 fL (ref 79.3–98.0)
MONO#: 0.4 10*3/uL (ref 0.1–0.9)
MONO%: 15 % — ABNORMAL HIGH (ref 0.0–14.0)
NEUT%: 63.6 % (ref 39.0–75.0)
NEUTROS ABS: 1.7 10*3/uL (ref 1.5–6.5)
PLATELETS: 212 10*3/uL (ref 140–400)
RBC: 5.02 10*6/uL (ref 4.20–5.82)
RDW: 16 % — AB (ref 11.0–14.6)
WBC: 2.7 10*3/uL — AB (ref 4.0–10.3)

## 2017-06-12 MED ORDER — SODIUM CHLORIDE 0.9 % IV SOLN
Freq: Once | INTRAVENOUS | Status: AC
  Administered 2017-06-12: 11:00:00 via INTRAVENOUS

## 2017-06-12 MED ORDER — PALONOSETRON HCL INJECTION 0.25 MG/5ML
INTRAVENOUS | Status: AC
  Filled 2017-06-12: qty 5

## 2017-06-12 MED ORDER — PALONOSETRON HCL INJECTION 0.25 MG/5ML
0.2500 mg | Freq: Once | INTRAVENOUS | Status: AC
  Administered 2017-06-12: 0.25 mg via INTRAVENOUS

## 2017-06-12 MED ORDER — FAMOTIDINE IN NACL 20-0.9 MG/50ML-% IV SOLN
20.0000 mg | Freq: Once | INTRAVENOUS | Status: AC
  Administered 2017-06-12: 20 mg via INTRAVENOUS

## 2017-06-12 MED ORDER — CARBOPLATIN CHEMO INJECTION 450 MG/45ML
255.6000 mg | Freq: Once | INTRAVENOUS | Status: AC
  Administered 2017-06-12: 260 mg via INTRAVENOUS
  Filled 2017-06-12: qty 26

## 2017-06-12 MED ORDER — DIPHENHYDRAMINE HCL 50 MG/ML IJ SOLN
50.0000 mg | Freq: Once | INTRAMUSCULAR | Status: AC
  Administered 2017-06-12: 50 mg via INTRAVENOUS

## 2017-06-12 MED ORDER — SODIUM CHLORIDE 0.9 % IV SOLN
20.0000 mg | Freq: Once | INTRAVENOUS | Status: AC
  Administered 2017-06-12: 20 mg via INTRAVENOUS
  Filled 2017-06-12: qty 2

## 2017-06-12 MED ORDER — FAMOTIDINE IN NACL 20-0.9 MG/50ML-% IV SOLN
INTRAVENOUS | Status: AC
  Filled 2017-06-12: qty 50

## 2017-06-12 MED ORDER — DEXTROSE 5 % IV SOLN
45.0000 mg/m2 | Freq: Once | INTRAVENOUS | Status: AC
  Administered 2017-06-12: 96 mg via INTRAVENOUS
  Filled 2017-06-12: qty 16

## 2017-06-12 MED ORDER — DIPHENHYDRAMINE HCL 50 MG/ML IJ SOLN
INTRAMUSCULAR | Status: AC
  Filled 2017-06-12: qty 1

## 2017-06-12 NOTE — Patient Instructions (Signed)
[facility] Discharge Instructions for Patients Receiving Chemotherapy  Today you received the following chemotherapy agents Taxol and Carboplatin.  To help prevent nausea and vomiting after your treatment, we encourage you to take your nausea medication as directed - TAKE YOUR COMPAZINE AS NEEDED FOR NAUSEA. CALL CLINIC IF IT IS NOT HELPING!   If you develop nausea and vomiting that is not controlled by your nausea medication, call the clinic.   BELOW ARE SYMPTOMS THAT SHOULD BE REPORTED IMMEDIATELY:  *FEVER GREATER THAN 100.5 F  *CHILLS WITH OR WITHOUT FEVER  NAUSEA AND VOMITING THAT IS NOT CONTROLLED WITH YOUR NAUSEA MEDICATION  *UNUSUAL SHORTNESS OF BREATH  *UNUSUAL BRUISING OR BLEEDING  TENDERNESS IN MOUTH AND THROAT WITH OR WITHOUT PRESENCE OF ULCERS  *URINARY PROBLEMS  *BOWEL PROBLEMS  UNUSUAL RASH Items with * indicate a potential emergency and should be followed up as soon as possible.  Feel free to call the clinic you have any questions or concerns. The clinic phone number is [phone].  Please show the [facility] at check-in to the Emergency Department and triage nurse.

## 2017-06-13 ENCOUNTER — Ambulatory Visit
Admission: RE | Admit: 2017-06-13 | Discharge: 2017-06-13 | Disposition: A | Discharge status: Home / Self Care | Payer: Medicare Other | Source: Ambulatory Visit | Attending: Radiation Oncology | Admitting: Radiation Oncology

## 2017-06-13 DIAGNOSIS — C3432 Malignant neoplasm of lower lobe, left bronchus or lung: Secondary | ICD-10-CM | POA: Diagnosis not present

## 2017-06-13 DIAGNOSIS — Z510 Encounter for antineoplastic radiation therapy: Secondary | ICD-10-CM | POA: Diagnosis not present

## 2017-06-13 DIAGNOSIS — Z9049 Acquired absence of other specified parts of digestive tract: Secondary | ICD-10-CM | POA: Diagnosis not present

## 2017-06-13 DIAGNOSIS — E669 Obesity, unspecified: Secondary | ICD-10-CM | POA: Diagnosis not present

## 2017-06-13 DIAGNOSIS — J449 Chronic obstructive pulmonary disease, unspecified: Secondary | ICD-10-CM | POA: Diagnosis not present

## 2017-06-13 DIAGNOSIS — I5032 Chronic diastolic (congestive) heart failure: Secondary | ICD-10-CM | POA: Diagnosis not present

## 2017-06-14 ENCOUNTER — Ambulatory Visit
Admission: RE | Admit: 2017-06-14 | Discharge: 2017-06-14 | Disposition: A | Discharge status: Home / Self Care | Payer: Medicare Other | Source: Ambulatory Visit | Attending: Radiation Oncology | Admitting: Radiation Oncology

## 2017-06-14 DIAGNOSIS — J449 Chronic obstructive pulmonary disease, unspecified: Secondary | ICD-10-CM | POA: Diagnosis not present

## 2017-06-14 DIAGNOSIS — Z510 Encounter for antineoplastic radiation therapy: Secondary | ICD-10-CM | POA: Diagnosis not present

## 2017-06-14 DIAGNOSIS — E669 Obesity, unspecified: Secondary | ICD-10-CM | POA: Diagnosis not present

## 2017-06-14 DIAGNOSIS — I5032 Chronic diastolic (congestive) heart failure: Secondary | ICD-10-CM | POA: Diagnosis not present

## 2017-06-14 DIAGNOSIS — Z9049 Acquired absence of other specified parts of digestive tract: Secondary | ICD-10-CM | POA: Diagnosis not present

## 2017-06-14 DIAGNOSIS — C3432 Malignant neoplasm of lower lobe, left bronchus or lung: Secondary | ICD-10-CM | POA: Diagnosis not present

## 2017-06-15 ENCOUNTER — Ambulatory Visit
Admission: RE | Admit: 2017-06-15 | Discharge: 2017-06-15 | Disposition: A | Discharge status: Home / Self Care | Payer: Medicare Other | Source: Ambulatory Visit | Attending: Radiation Oncology | Admitting: Radiation Oncology

## 2017-06-15 DIAGNOSIS — Z9049 Acquired absence of other specified parts of digestive tract: Secondary | ICD-10-CM | POA: Diagnosis not present

## 2017-06-15 DIAGNOSIS — I5032 Chronic diastolic (congestive) heart failure: Secondary | ICD-10-CM | POA: Diagnosis not present

## 2017-06-15 DIAGNOSIS — E669 Obesity, unspecified: Secondary | ICD-10-CM | POA: Diagnosis not present

## 2017-06-15 DIAGNOSIS — C3432 Malignant neoplasm of lower lobe, left bronchus or lung: Secondary | ICD-10-CM | POA: Diagnosis not present

## 2017-06-15 DIAGNOSIS — J449 Chronic obstructive pulmonary disease, unspecified: Secondary | ICD-10-CM | POA: Diagnosis not present

## 2017-06-15 DIAGNOSIS — Z510 Encounter for antineoplastic radiation therapy: Secondary | ICD-10-CM | POA: Diagnosis not present

## 2017-06-18 ENCOUNTER — Ambulatory Visit
Admission: RE | Admit: 2017-06-18 | Discharge: 2017-06-18 | Disposition: A | Discharge status: Home / Self Care | Payer: Medicare Other | Source: Ambulatory Visit | Attending: Radiation Oncology | Admitting: Radiation Oncology

## 2017-06-18 ENCOUNTER — Other Ambulatory Visit (HOSPITAL_BASED_OUTPATIENT_CLINIC_OR_DEPARTMENT_OTHER): Payer: Medicare Other

## 2017-06-18 ENCOUNTER — Ambulatory Visit (HOSPITAL_BASED_OUTPATIENT_CLINIC_OR_DEPARTMENT_OTHER): Payer: Medicare Other

## 2017-06-18 DIAGNOSIS — Z5111 Encounter for antineoplastic chemotherapy: Secondary | ICD-10-CM | POA: Diagnosis not present

## 2017-06-18 DIAGNOSIS — C3492 Malignant neoplasm of unspecified part of left bronchus or lung: Secondary | ICD-10-CM

## 2017-06-18 DIAGNOSIS — J449 Chronic obstructive pulmonary disease, unspecified: Secondary | ICD-10-CM | POA: Diagnosis not present

## 2017-06-18 DIAGNOSIS — C3432 Malignant neoplasm of lower lobe, left bronchus or lung: Secondary | ICD-10-CM | POA: Diagnosis not present

## 2017-06-18 DIAGNOSIS — Z9049 Acquired absence of other specified parts of digestive tract: Secondary | ICD-10-CM | POA: Diagnosis not present

## 2017-06-18 DIAGNOSIS — E669 Obesity, unspecified: Secondary | ICD-10-CM | POA: Diagnosis not present

## 2017-06-18 DIAGNOSIS — I5032 Chronic diastolic (congestive) heart failure: Secondary | ICD-10-CM | POA: Diagnosis not present

## 2017-06-18 DIAGNOSIS — Z510 Encounter for antineoplastic radiation therapy: Secondary | ICD-10-CM | POA: Diagnosis not present

## 2017-06-18 LAB — COMPREHENSIVE METABOLIC PANEL
ALT: 14 U/L (ref 0–55)
AST: 15 U/L (ref 5–34)
Albumin: 3.4 g/dL — ABNORMAL LOW (ref 3.5–5.0)
Alkaline Phosphatase: 86 U/L (ref 40–150)
Anion Gap: 5 mEq/L (ref 3–11)
BILIRUBIN TOTAL: 0.34 mg/dL (ref 0.20–1.20)
BUN: 16 mg/dL (ref 7.0–26.0)
CHLORIDE: 102 meq/L (ref 98–109)
CO2: 29 meq/L (ref 22–29)
Calcium: 9.5 mg/dL (ref 8.4–10.4)
Creatinine: 1.1 mg/dL (ref 0.7–1.3)
EGFR: 81 mL/min/{1.73_m2} — AB (ref >90)
Glucose: 99 mg/dl (ref 70–140)
Potassium: 4.6 mEq/L (ref 3.5–5.1)
SODIUM: 136 meq/L (ref 136–145)
TOTAL PROTEIN: 7.3 g/dL (ref 6.4–8.3)

## 2017-06-18 LAB — CBC WITH DIFFERENTIAL/PLATELET
BASO%: 1.7 % (ref 0.0–2.0)
Basophils Absolute: 0.1 10*3/uL (ref 0.0–0.1)
EOS%: 1 % (ref 0.0–7.0)
Eosinophils Absolute: 0 10*3/uL (ref 0.0–0.5)
HCT: 41.6 % (ref 38.4–49.9)
HGB: 13.2 g/dL (ref 13.0–17.1)
LYMPH%: 16.2 % (ref 14.0–49.0)
MCH: 26.6 pg — ABNORMAL LOW (ref 27.2–33.4)
MCHC: 31.7 g/dL — ABNORMAL LOW (ref 32.0–36.0)
MCV: 83.7 fL (ref 79.3–98.0)
MONO#: 0.4 10*3/uL (ref 0.1–0.9)
MONO%: 11.8 % (ref 0.0–14.0)
NEUT%: 69.3 % (ref 39.0–75.0)
NEUTROS ABS: 2.1 10*3/uL (ref 1.5–6.5)
Platelets: 142 10*3/uL (ref 140–400)
RBC: 4.97 10*6/uL (ref 4.20–5.82)
RDW: 15.5 % — ABNORMAL HIGH (ref 11.0–14.6)
WBC: 3 10*3/uL — AB (ref 4.0–10.3)
lymph#: 0.5 10*3/uL — ABNORMAL LOW (ref 0.9–3.3)

## 2017-06-18 MED ORDER — PACLITAXEL CHEMO INJECTION 300 MG/50ML
45.0000 mg/m2 | Freq: Once | INTRAVENOUS | Status: AC
  Administered 2017-06-18: 96 mg via INTRAVENOUS
  Filled 2017-06-18: qty 16

## 2017-06-18 MED ORDER — DIPHENHYDRAMINE HCL 50 MG/ML IJ SOLN
50.0000 mg | Freq: Once | INTRAMUSCULAR | Status: AC
  Administered 2017-06-18: 50 mg via INTRAVENOUS

## 2017-06-18 MED ORDER — SODIUM CHLORIDE 0.9 % IV SOLN
255.6000 mg | Freq: Once | INTRAVENOUS | Status: AC
  Administered 2017-06-18: 260 mg via INTRAVENOUS
  Filled 2017-06-18: qty 26

## 2017-06-18 MED ORDER — DIPHENHYDRAMINE HCL 50 MG/ML IJ SOLN
INTRAMUSCULAR | Status: AC
  Filled 2017-06-18: qty 1

## 2017-06-18 MED ORDER — PALONOSETRON HCL INJECTION 0.25 MG/5ML
0.2500 mg | Freq: Once | INTRAVENOUS | Status: AC
  Administered 2017-06-18: 0.25 mg via INTRAVENOUS

## 2017-06-18 MED ORDER — FAMOTIDINE IN NACL 20-0.9 MG/50ML-% IV SOLN
INTRAVENOUS | Status: AC
  Filled 2017-06-18: qty 50

## 2017-06-18 MED ORDER — SODIUM CHLORIDE 0.9 % IV SOLN
Freq: Once | INTRAVENOUS | Status: AC
  Administered 2017-06-18: 11:00:00 via INTRAVENOUS

## 2017-06-18 MED ORDER — SODIUM CHLORIDE 0.9 % IV SOLN
20.0000 mg | Freq: Once | INTRAVENOUS | Status: AC
  Administered 2017-06-18: 20 mg via INTRAVENOUS
  Filled 2017-06-18: qty 2

## 2017-06-18 MED ORDER — FAMOTIDINE IN NACL 20-0.9 MG/50ML-% IV SOLN
20.0000 mg | Freq: Once | INTRAVENOUS | Status: AC
  Administered 2017-06-18: 20 mg via INTRAVENOUS

## 2017-06-18 MED ORDER — PALONOSETRON HCL INJECTION 0.25 MG/5ML
INTRAVENOUS | Status: AC
  Filled 2017-06-18: qty 5

## 2017-06-18 NOTE — Patient Instructions (Signed)
[facility] Discharge Instructions for Patients Receiving Chemotherapy  Today you received the following chemotherapy agents Taxol and Carboplatin.  To help prevent nausea and vomiting after your treatment, we encourage you to take your nausea medication as directed - TAKE YOUR COMPAZINE AS NEEDED FOR NAUSEA. CALL CLINIC IF IT IS NOT HELPING!   If you develop nausea and vomiting that is not controlled by your nausea medication, call the clinic.   BELOW ARE SYMPTOMS THAT SHOULD BE REPORTED IMMEDIATELY:  *FEVER GREATER THAN 100.5 F  *CHILLS WITH OR WITHOUT FEVER  NAUSEA AND VOMITING THAT IS NOT CONTROLLED WITH YOUR NAUSEA MEDICATION  *UNUSUAL SHORTNESS OF BREATH  *UNUSUAL BRUISING OR BLEEDING  TENDERNESS IN MOUTH AND THROAT WITH OR WITHOUT PRESENCE OF ULCERS  *URINARY PROBLEMS  *BOWEL PROBLEMS  UNUSUAL RASH Items with * indicate a potential emergency and should be followed up as soon as possible.  Feel free to call the clinic you have any questions or concerns. The clinic phone number is [phone].  Please show the [facility] at check-in to the Emergency Department and triage nurse.

## 2017-06-19 ENCOUNTER — Ambulatory Visit (HOSPITAL_BASED_OUTPATIENT_CLINIC_OR_DEPARTMENT_OTHER): Payer: Medicare Other | Admitting: Oncology

## 2017-06-19 ENCOUNTER — Encounter: Payer: Self-pay | Admitting: Oncology

## 2017-06-19 ENCOUNTER — Ambulatory Visit
Admission: RE | Admit: 2017-06-19 | Discharge: 2017-06-19 | Disposition: A | Discharge status: Home / Self Care | Payer: Medicare Other | Source: Ambulatory Visit | Attending: Radiation Oncology | Admitting: Radiation Oncology

## 2017-06-19 VITALS — BP 133/87 | HR 103 | Temp 97.8°F | Resp 19 | Ht 67.0 in | Wt 227.5 lb

## 2017-06-19 DIAGNOSIS — K869 Disease of pancreas, unspecified: Secondary | ICD-10-CM | POA: Diagnosis not present

## 2017-06-19 DIAGNOSIS — I5032 Chronic diastolic (congestive) heart failure: Secondary | ICD-10-CM | POA: Diagnosis not present

## 2017-06-19 DIAGNOSIS — Z510 Encounter for antineoplastic radiation therapy: Secondary | ICD-10-CM | POA: Diagnosis not present

## 2017-06-19 DIAGNOSIS — J449 Chronic obstructive pulmonary disease, unspecified: Secondary | ICD-10-CM | POA: Diagnosis not present

## 2017-06-19 DIAGNOSIS — Z9049 Acquired absence of other specified parts of digestive tract: Secondary | ICD-10-CM | POA: Diagnosis not present

## 2017-06-19 DIAGNOSIS — C3492 Malignant neoplasm of unspecified part of left bronchus or lung: Secondary | ICD-10-CM

## 2017-06-19 DIAGNOSIS — Z5111 Encounter for antineoplastic chemotherapy: Secondary | ICD-10-CM

## 2017-06-19 DIAGNOSIS — E669 Obesity, unspecified: Secondary | ICD-10-CM | POA: Diagnosis not present

## 2017-06-19 DIAGNOSIS — C3432 Malignant neoplasm of lower lobe, left bronchus or lung: Secondary | ICD-10-CM | POA: Diagnosis not present

## 2017-06-19 NOTE — Assessment & Plan Note (Addendum)
This is a very pleasant 61 year old African-American male recently diagnosed with a stage IIIa non-small cell lung cancer, adenocarcinoma presented with left lower lobe lung mass in addition to mediastinal lymphadenopathy. The patient is currently undergoing a course of concurrent chemoradiation with weekly carboplatin and paclitaxel. Status post 5 cycles.   The patient is tolerating the treatment well. I recommended for him to proceed with cycle #6 today as scheduled pending repeat labs next week.  Follow-up will be in 2 weeks for evaluation before starting cycle #7. He was advised to call immediately if he has any concerning symptoms in the interval.

## 2017-06-19 NOTE — Progress Notes (Signed)
[facility] Follow up:    James Beard, MD [address] [place] [address]   DIAGNOSIS: Cancer Staging No matching staging information was found for the patient. Stage IIIA (T2a, N2, M0) non-small cell lung cancer, adenocarcinoma presented with left lower lobe lung mass in addition to mediastinal lymphadenopathy diagnosed in June 2018. The patient also has suspicious soft tissue density in the pancreatic head but this has been present for 3 years on previous CT scan and unlikely to be related to his current diagnosis of the lung cancer.  SUMMARY OF ONCOLOGIC HISTORY:  No history exists.    CURRENT THERAPY: Concurrent chemoradiation with weekly carboplatin for AUC of 2 and paclitaxel 45 MG/M2. Status post 5 cycles.  INTERVAL HISTORY: James Zamora 61 y.o. male returns for routine follow-up accompanied by his wife. The patient is feeling fine today with no specific complaints. The patient received his fifth cycle of chemotherapy yesterday. He continues to have his baseline shortness of breath and is currently on home oxygen. He has no fevers or chills. He denies chest pain, shortness of breath, cough, hemoptysis. Denies neuropathy. Denies difficulty swallowing. Since his last visit, the patient underwent an upper endoscopic ultrasound to evaluate the pancreatic mass. The mass was not identified during this exam and no biopsies were taken. The patient is here for evaluation prior to starting cycle 6 of his chemotherapy next week.   Patient Active Problem List   Diagnosis Date Noted  . Pancreatic mass   . Encounter for antineoplastic chemotherapy 06/04/2017  . Adenocarcinoma of left lung, stage 3 ([facility]) 05/10/2017  . COPD (chronic obstructive pulmonary disease) ([facility]) 03/29/2017  . Edema of left lower extremity 11/27/2016  . Swelling   . Acute diastolic CHF (congestive heart failure) ([facility]) 06/07/2016  . Emesis 06/07/2016  . Generalized abdominal pain  06/07/2016  . Anemia 06/07/2016  . Dehydration 11/13/2015  . AKI (acute kidney injury) ([facility]) 11/13/2015  . Wound infection 11/13/2015  . Non-small cell lung cancer ([facility]) - Adenocarcinoma 11/13/2015    is allergic to bee venom and penicillins.  MEDICAL HISTORY: Past Medical History:  Diagnosis Date  . Adenocarcinoma of left lung, stage 3 ([facility]) 05/10/2017  . Asthma   . Chronic diastolic CHF (congestive heart failure) ([facility])   . Lung mass   . Non-small cell lung cancer ([facility]) 11/13/2015  . Obesity   . Perforated bowel ([facility])     SURGICAL HISTORY: Past Surgical History:  Procedure Laterality Date  . APPENDECTOMY    . CHOLECYSTECTOMY    . COLONOSCOPY N/A 07/31/2016   Procedure: COLONOSCOPY;  Surgeon: Danie Binder, MD;  Location[facility];  Service: Endoscopy;  Laterality: N/A;  1:45 pm  . ESOPHAGOGASTRODUODENOSCOPY N/A 07/31/2016   Procedure: ESOPHAGOGASTRODUODENOSCOPY (EGD);  Surgeon: Danie Binder, MD;  Location: [facility];  Service: Endoscopy;  Laterality: N/A;  . EUS N/A 06/07/2017   Procedure: UPPER ENDOSCOPIC ULTRASOUND (EUS) LINEAR;  Surgeon: Milus Banister, MD;  Location: [facility];  Service: Endoscopy;  Laterality: N/A;  . HERNIA REPAIR      SOCIAL HISTORY: Social History   Social History  . Marital status: Single    Spouse name: N/A  . Number of children: N/A  . Years of education: N/A   Occupational History  . Not on file.   Social History Main Topics  . Smoking status: Former Smoker    Packs/day: 2.00    Years: 10.00    Types:  Cigarettes    Quit date: 07/06/2012  . Smokeless tobacco: Former Systems developer    Types: Chew    Quit date: 07/06/2014  . Alcohol use No  . Drug use: No  . Sexual activity: Not on file   Other Topics Concern  . Not on file   Social History Narrative  . No narrative on file    FAMILY HISTORY: Family History  Problem Relation Age of Onset  . Diabetes Mother   . Asthma Father   . Colon cancer Neg Hx     Review of  Systems  Constitutional: Negative.   HENT:  Negative.   Eyes: Negative.   Respiratory: Positive for shortness of breath. Negative for cough and wheezing.   Cardiovascular: Negative.   Gastrointestinal: Negative.   Endocrine: Negative.   Genitourinary: Negative.    Musculoskeletal: Negative.   Skin: Negative.   Neurological: Negative.   Hematological: Negative.   Psychiatric/Behavioral: Negative.       PHYSICAL EXAMINATION  ECOG PERFORMANCE STATUS: 1 - Symptomatic but completely ambulatory  Vitals:   06/19/17 1039  BP: 133/87  Pulse: (!) 103  Resp: 19  Temp: 97.8 F (36.6 C)  SpO2: 96%    Physical Exam  Constitutional: He is oriented to person, place, and time and well-developed, well-nourished, and in no distress. No distress.  HENT:  Head: Normocephalic.  Mouth/Throat: Oropharynx is clear and moist. No oropharyngeal exudate.  Eyes: Conjunctivae are normal. No scleral icterus.  Neck: Normal range of motion. Neck supple.  Cardiovascular: Normal rate, regular rhythm, normal heart sounds and intact distal pulses.   Pulmonary/Chest: Effort normal and breath sounds normal. No respiratory distress. He has no wheezes. He has no rales.  Abdominal: Soft. Bowel sounds are normal. He exhibits no distension and no mass. There is no tenderness.  Musculoskeletal: Normal range of motion. He exhibits no edema.  Lymphadenopathy:    He has no cervical adenopathy.  Neurological: He is alert and oriented to person, place, and time. He exhibits normal muscle tone. Gait normal.  Skin: Skin is warm and dry. No rash noted. He is not diaphoretic. No erythema. No pallor.  Psychiatric: Mood, memory, affect and judgment normal.  Vitals reviewed.   LABORATORY DATA:  CBC    Component Value Date/Time   WBC 3.0 (L) 06/18/2017 1014   WBC 6.1 04/24/2017 0655   RBC 4.97 06/18/2017 1014   RBC 4.81 04/24/2017 0655   HGB 13.2 06/18/2017 1014   HCT 41.6 06/18/2017 1014   PLT 142 06/18/2017 1014    MCV 83.7 06/18/2017 1014   MCH 26.6 (L) 06/18/2017 1014   MCH 25.8 (L) 04/24/2017 0655   MCHC 31.7 (L) 06/18/2017 1014   MCHC 30.6 04/24/2017 0655   RDW 15.5 (H) 06/18/2017 1014   LYMPHSABS 0.5 (L) 06/18/2017 1014   MONOABS 0.4 06/18/2017 1014   EOSABS 0.0 06/18/2017 1014   BASOSABS 0.1 06/18/2017 1014    CMP     Component Value Date/Time   NA 136 06/18/2017 1015   K 4.6 06/18/2017 1015   CL 100 (L) 03/08/2017 1851   CO2 29 06/18/2017 1015   GLUCOSE 99 06/18/2017 1015   BUN 16.0 06/18/2017 1015   CREATININE 1.1 06/18/2017 1015   CALCIUM 9.5 06/18/2017 1015   PROT 7.3 06/18/2017 1015   ALBUMIN 3.4 (L) 06/18/2017 1015   AST 15 06/18/2017 1015   ALT 14 06/18/2017 1015   ALKPHOS 86 06/18/2017 1015   BILITOT 0.34 06/18/2017 1015   GFRNONAA 54 (  L) 05/16/2017 1730   GFRAA >60 05/16/2017 1730    RADIOGRAPHIC STUDIES:  No results found.  ASSESSMENT and THERAPY PLAN:   Non-small cell lung cancer ([facility]) - Adenocarcinoma This is a very pleasant 61 year old African-American male recently diagnosed with a stage IIIa non-small cell lung cancer, adenocarcinoma presented with left lower lobe lung mass in addition to mediastinal lymphadenopathy. The patient is currently undergoing a course of concurrent chemoradiation with weekly carboplatin and paclitaxel. Status post 5 cycles.   The patient is tolerating the treatment well. I recommended for him to proceed with cycle #6 today as scheduled pending repeat labs next week.  Follow-up will be in 2 weeks for evaluation before starting cycle #7. He was advised to call immediately if he has any concerning symptoms in the interval.   No orders of the defined types were placed in this encounter.   All questions were answered. The patient knows to call the clinic with any problems, questions or concerns. We can certainly see the patient much sooner if necessary.  Mikey Bussing, NP 06/19/2017

## 2017-06-20 ENCOUNTER — Ambulatory Visit
Admission: RE | Admit: 2017-06-20 | Discharge: 2017-06-20 | Disposition: A | Discharge status: Home / Self Care | Payer: Medicare Other | Source: Ambulatory Visit | Attending: Radiation Oncology | Admitting: Radiation Oncology

## 2017-06-20 ENCOUNTER — Telehealth: Payer: Self-pay | Admitting: Oncology

## 2017-06-20 DIAGNOSIS — E669 Obesity, unspecified: Secondary | ICD-10-CM | POA: Diagnosis not present

## 2017-06-20 DIAGNOSIS — I5032 Chronic diastolic (congestive) heart failure: Secondary | ICD-10-CM | POA: Diagnosis not present

## 2017-06-20 DIAGNOSIS — J449 Chronic obstructive pulmonary disease, unspecified: Secondary | ICD-10-CM | POA: Diagnosis not present

## 2017-06-20 DIAGNOSIS — C3432 Malignant neoplasm of lower lobe, left bronchus or lung: Secondary | ICD-10-CM | POA: Diagnosis not present

## 2017-06-20 DIAGNOSIS — Z9049 Acquired absence of other specified parts of digestive tract: Secondary | ICD-10-CM | POA: Diagnosis not present

## 2017-06-20 DIAGNOSIS — Z510 Encounter for antineoplastic radiation therapy: Secondary | ICD-10-CM | POA: Diagnosis not present

## 2017-06-20 NOTE — Telephone Encounter (Signed)
No 8/21 los.

## 2017-06-21 ENCOUNTER — Ambulatory Visit
Admission: RE | Admit: 2017-06-21 | Discharge: 2017-06-21 | Disposition: A | Discharge status: Home / Self Care | Payer: Medicare Other | Source: Ambulatory Visit | Attending: Radiation Oncology | Admitting: Radiation Oncology

## 2017-06-21 DIAGNOSIS — Z510 Encounter for antineoplastic radiation therapy: Secondary | ICD-10-CM | POA: Diagnosis not present

## 2017-06-21 DIAGNOSIS — Z9049 Acquired absence of other specified parts of digestive tract: Secondary | ICD-10-CM | POA: Diagnosis not present

## 2017-06-21 DIAGNOSIS — E669 Obesity, unspecified: Secondary | ICD-10-CM | POA: Diagnosis not present

## 2017-06-21 DIAGNOSIS — I5032 Chronic diastolic (congestive) heart failure: Secondary | ICD-10-CM | POA: Diagnosis not present

## 2017-06-21 DIAGNOSIS — J449 Chronic obstructive pulmonary disease, unspecified: Secondary | ICD-10-CM | POA: Diagnosis not present

## 2017-06-21 DIAGNOSIS — C3432 Malignant neoplasm of lower lobe, left bronchus or lung: Secondary | ICD-10-CM | POA: Diagnosis not present

## 2017-06-22 ENCOUNTER — Ambulatory Visit
Admission: RE | Admit: 2017-06-22 | Discharge: 2017-06-22 | Disposition: A | Discharge status: Home / Self Care | Payer: Medicare Other | Source: Ambulatory Visit | Attending: Radiation Oncology | Admitting: Radiation Oncology

## 2017-06-22 DIAGNOSIS — C3432 Malignant neoplasm of lower lobe, left bronchus or lung: Secondary | ICD-10-CM | POA: Diagnosis not present

## 2017-06-22 DIAGNOSIS — E669 Obesity, unspecified: Secondary | ICD-10-CM | POA: Diagnosis not present

## 2017-06-22 DIAGNOSIS — I5032 Chronic diastolic (congestive) heart failure: Secondary | ICD-10-CM | POA: Diagnosis not present

## 2017-06-22 DIAGNOSIS — Z9049 Acquired absence of other specified parts of digestive tract: Secondary | ICD-10-CM | POA: Diagnosis not present

## 2017-06-22 DIAGNOSIS — Z510 Encounter for antineoplastic radiation therapy: Secondary | ICD-10-CM | POA: Diagnosis not present

## 2017-06-22 DIAGNOSIS — J449 Chronic obstructive pulmonary disease, unspecified: Secondary | ICD-10-CM | POA: Diagnosis not present

## 2017-06-25 ENCOUNTER — Other Ambulatory Visit (HOSPITAL_BASED_OUTPATIENT_CLINIC_OR_DEPARTMENT_OTHER): Payer: Medicare Other

## 2017-06-25 ENCOUNTER — Ambulatory Visit
Admission: RE | Admit: 2017-06-25 | Discharge: 2017-06-25 | Disposition: A | Discharge status: Home / Self Care | Payer: Medicare Other | Source: Ambulatory Visit | Attending: Radiation Oncology | Admitting: Radiation Oncology

## 2017-06-25 ENCOUNTER — Other Ambulatory Visit: Payer: Self-pay | Admitting: Medical Oncology

## 2017-06-25 ENCOUNTER — Ambulatory Visit: Payer: Medicare Other

## 2017-06-25 ENCOUNTER — Ambulatory Visit (HOSPITAL_BASED_OUTPATIENT_CLINIC_OR_DEPARTMENT_OTHER): Payer: Medicare Other

## 2017-06-25 VITALS — BP 129/84 | HR 97 | Temp 97.7°F | Resp 20

## 2017-06-25 DIAGNOSIS — C3432 Malignant neoplasm of lower lobe, left bronchus or lung: Secondary | ICD-10-CM | POA: Diagnosis present

## 2017-06-25 DIAGNOSIS — R112 Nausea with vomiting, unspecified: Secondary | ICD-10-CM | POA: Diagnosis not present

## 2017-06-25 DIAGNOSIS — Z5111 Encounter for antineoplastic chemotherapy: Secondary | ICD-10-CM

## 2017-06-25 DIAGNOSIS — J449 Chronic obstructive pulmonary disease, unspecified: Secondary | ICD-10-CM | POA: Diagnosis not present

## 2017-06-25 DIAGNOSIS — C3492 Malignant neoplasm of unspecified part of left bronchus or lung: Secondary | ICD-10-CM

## 2017-06-25 DIAGNOSIS — Z510 Encounter for antineoplastic radiation therapy: Secondary | ICD-10-CM | POA: Diagnosis not present

## 2017-06-25 DIAGNOSIS — Z9049 Acquired absence of other specified parts of digestive tract: Secondary | ICD-10-CM | POA: Diagnosis not present

## 2017-06-25 DIAGNOSIS — E669 Obesity, unspecified: Secondary | ICD-10-CM | POA: Diagnosis not present

## 2017-06-25 DIAGNOSIS — I5032 Chronic diastolic (congestive) heart failure: Secondary | ICD-10-CM | POA: Diagnosis not present

## 2017-06-25 LAB — CBC WITH DIFFERENTIAL/PLATELET
BASO%: 0.3 % (ref 0.0–2.0)
BASOS ABS: 0 10*3/uL (ref 0.0–0.1)
EOS%: 0.2 % (ref 0.0–7.0)
Eosinophils Absolute: 0 10*3/uL (ref 0.0–0.5)
HEMATOCRIT: 44.4 % (ref 38.4–49.9)
HGB: 14.5 g/dL (ref 13.0–17.1)
LYMPH#: 0.3 10*3/uL — AB (ref 0.9–3.3)
LYMPH%: 6.5 % — ABNORMAL LOW (ref 14.0–49.0)
MCH: 26.7 pg — ABNORMAL LOW (ref 27.2–33.4)
MCHC: 32.7 g/dL (ref 32.0–36.0)
MCV: 81.6 fL (ref 79.3–98.0)
MONO#: 0.4 10*3/uL (ref 0.1–0.9)
MONO%: 9.1 % (ref 0.0–14.0)
NEUT#: 3.9 10*3/uL (ref 1.5–6.5)
NEUT%: 83.9 % — AB (ref 39.0–75.0)
PLATELETS: 102 10*3/uL — AB (ref 140–400)
RBC: 5.44 10*6/uL (ref 4.20–5.82)
RDW: 16.2 % — ABNORMAL HIGH (ref 11.0–14.6)
WBC: 4.6 10*3/uL (ref 4.0–10.3)

## 2017-06-25 LAB — COMPREHENSIVE METABOLIC PANEL
ALK PHOS: 92 U/L (ref 40–150)
ALT: 15 U/L (ref 0–55)
ANION GAP: 11 meq/L (ref 3–11)
AST: 14 U/L (ref 5–34)
Albumin: 3.8 g/dL (ref 3.5–5.0)
BILIRUBIN TOTAL: 0.7 mg/dL (ref 0.20–1.20)
BUN: 15.9 mg/dL (ref 7.0–26.0)
CALCIUM: 9.7 mg/dL (ref 8.4–10.4)
CHLORIDE: 97 meq/L — AB (ref 98–109)
CO2: 25 mEq/L (ref 22–29)
CREATININE: 1.2 mg/dL (ref 0.7–1.3)
EGFR: 74 mL/min/{1.73_m2} — ABNORMAL LOW (ref >90)
Glucose: 108 mg/dl (ref 70–140)
Potassium: 3.6 mEq/L (ref 3.5–5.1)
Sodium: 133 mEq/L — ABNORMAL LOW (ref 136–145)
Total Protein: 8 g/dL (ref 6.4–8.3)

## 2017-06-25 MED ORDER — SODIUM CHLORIDE 0.9 % IV SOLN
1000.0000 mL | INTRAVENOUS | Status: DC
  Administered 2017-06-25: 12:00:00 via INTRAVENOUS

## 2017-06-25 MED ORDER — SODIUM CHLORIDE 0.9 % IV SOLN
20.0000 mg | Freq: Once | INTRAVENOUS | Status: AC
  Administered 2017-06-25: 20 mg via INTRAVENOUS
  Filled 2017-06-25: qty 2

## 2017-06-25 MED ORDER — PALONOSETRON HCL INJECTION 0.25 MG/5ML
0.2500 mg | Freq: Once | INTRAVENOUS | Status: AC
  Administered 2017-06-25: 0.25 mg via INTRAVENOUS

## 2017-06-25 MED ORDER — FAMOTIDINE IN NACL 20-0.9 MG/50ML-% IV SOLN
20.0000 mg | Freq: Once | INTRAVENOUS | Status: AC
  Administered 2017-06-25: 20 mg via INTRAVENOUS

## 2017-06-25 MED ORDER — PALONOSETRON HCL INJECTION 0.25 MG/5ML
INTRAVENOUS | Status: AC
  Filled 2017-06-25: qty 5

## 2017-06-25 MED ORDER — DIPHENHYDRAMINE HCL 50 MG/ML IJ SOLN
50.0000 mg | Freq: Once | INTRAMUSCULAR | Status: AC
  Administered 2017-06-25: 50 mg via INTRAVENOUS

## 2017-06-25 MED ORDER — SODIUM CHLORIDE 0.9 % IV SOLN
Freq: Once | INTRAVENOUS | Status: AC
  Administered 2017-06-25: 12:00:00 via INTRAVENOUS

## 2017-06-25 MED ORDER — FAMOTIDINE IN NACL 20-0.9 MG/50ML-% IV SOLN
INTRAVENOUS | Status: AC
  Filled 2017-06-25: qty 50

## 2017-06-25 MED ORDER — SODIUM CHLORIDE 0.9 % IV SOLN
260.0000 mg | Freq: Once | INTRAVENOUS | Status: AC
  Administered 2017-06-25: 260 mg via INTRAVENOUS
  Filled 2017-06-25: qty 26

## 2017-06-25 MED ORDER — DIPHENHYDRAMINE HCL 50 MG/ML IJ SOLN
INTRAMUSCULAR | Status: AC
  Filled 2017-06-25: qty 1

## 2017-06-25 MED ORDER — PACLITAXEL CHEMO INJECTION 300 MG/50ML
45.0000 mg/m2 | Freq: Once | INTRAVENOUS | Status: AC
  Administered 2017-06-25: 96 mg via INTRAVENOUS
  Filled 2017-06-25: qty 16

## 2017-06-25 MED ORDER — DEXAMETHASONE SODIUM PHOSPHATE 10 MG/ML IJ SOLN
INTRAMUSCULAR | Status: AC
  Filled 2017-06-25: qty 1

## 2017-06-25 NOTE — Patient Instructions (Signed)
[facility] Discharge Instructions for Patients Receiving Chemotherapy  Today you received the following chemotherapy agents Carboplatin, Taxol.   To help prevent nausea and vomiting after your treatment, we encourage you to take your nausea medication as prescribed.   If you develop nausea and vomiting that is not controlled by your nausea medication, call the clinic.   BELOW ARE SYMPTOMS THAT SHOULD BE REPORTED IMMEDIATELY:  *FEVER GREATER THAN 100.5 F  *CHILLS WITH OR WITHOUT FEVER  NAUSEA AND VOMITING THAT IS NOT CONTROLLED WITH YOUR NAUSEA MEDICATION  *UNUSUAL SHORTNESS OF BREATH  *UNUSUAL BRUISING OR BLEEDING  TENDERNESS IN MOUTH AND THROAT WITH OR WITHOUT PRESENCE OF ULCERS  *URINARY PROBLEMS  *BOWEL PROBLEMS  UNUSUAL RASH Items with * indicate a potential emergency and should be followed up as soon as possible.  Feel free to call the clinic you have any questions or concerns. The clinic phone number is [phone].  Please show the [facility] at check-in to the Emergency Department and triage nurse.  Dehydration, Adult Dehydration is a condition in which there is not enough fluid or water in the body. This happens when you lose more fluids than you take in. Important organs, such as the kidneys, brain, and heart, cannot function without a proper amount of fluids. Any loss of fluids from the body can lead to dehydration. Dehydration can range from mild to severe. This condition should be treated right away to prevent it from becoming severe. What are the causes? This condition may be caused by:  Vomiting.  Diarrhea.  Excessive sweating, such as from heat exposure or exercise.  Not drinking enough fluid, especially: ? When ill. ? While doing activity that requires a lot of energy.  Excessive urination.  Fever.  Infection.  Certain medicines, such as medicines that cause the body to lose excess fluid (diuretics).  Inability to  access safe drinking water.  Reduced physical ability to get adequate water and food.  What increases the risk? This condition is more likely to develop in people:  Who have a poorly controlled long-term (chronic) illness, such as diabetes, heart disease, or kidney disease.  Who are age 26 or older.  Who are disabled.  Who live in a place with high altitude.  Who play endurance sports.  What are the signs or symptoms? Symptoms of mild dehydration may include:  Thirst.  Dry lips.  Slightly dry mouth.  Dry, warm skin.  Dizziness. Symptoms of moderate dehydration may include:  Very dry mouth.  Muscle cramps.  Dark urine. Urine may be the color of tea.  Decreased urine production.  Decreased tear production.  Heartbeat that is irregular or faster than normal (palpitations).  Headache.  Light-headedness, especially when you stand up from a sitting position.  Fainting (syncope). Symptoms of severe dehydration may include:  Changes in skin, such as: ? Cold and clammy skin. ? Blotchy (mottled) or pale skin. ? Skin that does not quickly return to normal after being lightly pinched and released (poor skin turgor).  Changes in body fluids, such as: ? Extreme thirst. ? No tear production. ? Inability to sweat when body temperature is high, such as in hot weather. ? Very little urine production.  Changes in vital signs, such as: ? Weak pulse. ? Pulse that is more than 100 beats a minute when sitting still. ? Rapid breathing. ? Low blood pressure.  Other changes, such as: ? Sunken eyes. ? Cold hands and feet. ? Confusion. ? Lack  of energy (lethargy). ? Difficulty waking up from sleep. ? Short-term weight loss. ? Unconsciousness. How is this diagnosed? This condition is diagnosed based on your symptoms and a physical exam. Blood and urine tests may be done to help confirm the diagnosis. How is this treated? Treatment for this condition depends on the  severity. Mild or moderate dehydration can often be treated at home. Treatment should be started right away. Do not wait until dehydration becomes severe. Severe dehydration is an emergency and it needs to be treated in a hospital. Treatment for mild dehydration may include:  Drinking more fluids.  Replacing salts and minerals in your blood (electrolytes) that you may have lost. Treatment for moderate dehydration may include:  Drinking an oral rehydration solution (ORS). This is a drink that helps you replace fluids and electrolytes (rehydrate). It can be found at pharmacies and retail stores. Treatment for severe dehydration may include:  Receiving fluids through an IV tube.  Receiving an electrolyte solution through a feeding tube that is passed through your nose and into your stomach (nasogastric tube, or NG tube).  Correcting any abnormalities in electrolytes.  Treating the underlying cause of dehydration. Follow these instructions at home:  If directed by your health care provider, drink an ORS: ? Make an ORS by following instructions on the package. ? Start by drinking small amounts, about  cup (120 mL) every 5-10 minutes. ? Slowly increase how much you drink until you have taken the amount recommended by your health care provider.  Drink enough clear fluid to keep your urine clear or pale yellow. If you were told to drink an ORS, finish the ORS first, then start slowly drinking other clear fluids. Drink fluids such as: ? Water. Do not drink only water. Doing that can lead to having too little salt (sodium) in the body (hyponatremia). ? Ice chips. ? Fruit juice that you have added water to (diluted fruit juice). ? Low-calorie sports drinks.  Avoid: ? Alcohol. ? Drinks that contain a lot of sugar. These include high-calorie sports drinks, fruit juice that is not diluted, and soda. ? Caffeine. ? Foods that are greasy or contain a lot of fat or sugar.  Take over-the-counter  and prescription medicines only as told by your health care provider.  Do not take sodium tablets. This can lead to having too much sodium in the body (hypernatremia).  Eat foods that contain a healthy balance of electrolytes, such as bananas, oranges, potatoes, tomatoes, and spinach.  Keep all follow-up visits as told by your health care provider. This is important. Contact a health care provider if:  You have abdominal pain that: ? Gets worse. ? Stays in one area (localizes).  You have a rash.  You have a stiff neck.  You are more irritable than usual.  You are sleepier or more difficult to wake up than usual.  You feel weak or dizzy.  You feel very thirsty.  You have urinated only a small amount of very dark urine over 6-8 hours. Get help right away if:  You have symptoms of severe dehydration.  You cannot drink fluids without vomiting.  Your symptoms get worse with treatment.  You have a fever.  You have a severe headache.  You have vomiting or diarrhea that: ? Gets worse. ? Does not go away.  You have blood or green matter (bile) in your vomit.  You have blood in your stool. This may cause stool to look black and tarry.  You  have not urinated in 6-8 hours.  You faint.  Your heart rate while sitting still is over 100 beats a minute.  You have trouble breathing. This information is not intended to replace advice given to you by your health care provider. Make sure you discuss any questions you have with your health care provider. Document Released: 10/16/2005 Document Revised: 05/12/2016 Document Reviewed: 12/10/2015 Elsevier Interactive Patient Education  [employer].

## 2017-06-26 ENCOUNTER — Ambulatory Visit
Admission: RE | Admit: 2017-06-26 | Discharge: 2017-06-26 | Disposition: A | Discharge status: Home / Self Care | Payer: Medicare Other | Source: Ambulatory Visit | Attending: Radiation Oncology | Admitting: Radiation Oncology

## 2017-06-26 DIAGNOSIS — I5032 Chronic diastolic (congestive) heart failure: Secondary | ICD-10-CM | POA: Diagnosis not present

## 2017-06-26 DIAGNOSIS — J449 Chronic obstructive pulmonary disease, unspecified: Secondary | ICD-10-CM | POA: Diagnosis not present

## 2017-06-26 DIAGNOSIS — C3432 Malignant neoplasm of lower lobe, left bronchus or lung: Secondary | ICD-10-CM | POA: Diagnosis not present

## 2017-06-26 DIAGNOSIS — Z9049 Acquired absence of other specified parts of digestive tract: Secondary | ICD-10-CM | POA: Diagnosis not present

## 2017-06-26 DIAGNOSIS — Z510 Encounter for antineoplastic radiation therapy: Secondary | ICD-10-CM | POA: Diagnosis not present

## 2017-06-26 DIAGNOSIS — E669 Obesity, unspecified: Secondary | ICD-10-CM | POA: Diagnosis not present

## 2017-06-26 LAB — AMYLASE: AMYLASE: 39 U/L (ref 31–124)

## 2017-06-26 LAB — LIPASE: LIPASE: 20 U/L (ref 13–78)

## 2017-06-27 ENCOUNTER — Other Ambulatory Visit: Payer: Self-pay | Admitting: Medical Oncology

## 2017-06-27 ENCOUNTER — Ambulatory Visit
Admission: RE | Admit: 2017-06-27 | Discharge: 2017-06-27 | Disposition: A | Discharge status: Home / Self Care | Payer: Medicare Other | Source: Ambulatory Visit | Attending: Radiation Oncology | Admitting: Radiation Oncology

## 2017-06-27 DIAGNOSIS — J449 Chronic obstructive pulmonary disease, unspecified: Secondary | ICD-10-CM | POA: Diagnosis not present

## 2017-06-27 DIAGNOSIS — Z510 Encounter for antineoplastic radiation therapy: Secondary | ICD-10-CM | POA: Diagnosis not present

## 2017-06-27 DIAGNOSIS — Z9049 Acquired absence of other specified parts of digestive tract: Secondary | ICD-10-CM | POA: Diagnosis not present

## 2017-06-27 DIAGNOSIS — I5032 Chronic diastolic (congestive) heart failure: Secondary | ICD-10-CM | POA: Diagnosis not present

## 2017-06-27 DIAGNOSIS — I878 Other specified disorders of veins: Secondary | ICD-10-CM

## 2017-06-27 DIAGNOSIS — E669 Obesity, unspecified: Secondary | ICD-10-CM | POA: Diagnosis not present

## 2017-06-27 DIAGNOSIS — C3432 Malignant neoplasm of lower lobe, left bronchus or lung: Secondary | ICD-10-CM | POA: Diagnosis not present

## 2017-06-28 ENCOUNTER — Ambulatory Visit
Admission: RE | Admit: 2017-06-28 | Discharge: 2017-06-28 | Disposition: A | Discharge status: Home / Self Care | Payer: Medicare Other | Source: Ambulatory Visit | Attending: Radiation Oncology | Admitting: Radiation Oncology

## 2017-06-28 DIAGNOSIS — Z510 Encounter for antineoplastic radiation therapy: Secondary | ICD-10-CM | POA: Diagnosis not present

## 2017-06-28 DIAGNOSIS — C3432 Malignant neoplasm of lower lobe, left bronchus or lung: Secondary | ICD-10-CM | POA: Diagnosis not present

## 2017-06-28 DIAGNOSIS — E669 Obesity, unspecified: Secondary | ICD-10-CM | POA: Diagnosis not present

## 2017-06-28 DIAGNOSIS — I5032 Chronic diastolic (congestive) heart failure: Secondary | ICD-10-CM | POA: Diagnosis not present

## 2017-06-28 DIAGNOSIS — Z9049 Acquired absence of other specified parts of digestive tract: Secondary | ICD-10-CM | POA: Diagnosis not present

## 2017-06-28 DIAGNOSIS — J449 Chronic obstructive pulmonary disease, unspecified: Secondary | ICD-10-CM | POA: Diagnosis not present

## 2017-06-29 ENCOUNTER — Ambulatory Visit
Admission: RE | Admit: 2017-06-29 | Discharge: 2017-06-29 | Disposition: A | Discharge status: Home / Self Care | Payer: Medicare Other | Source: Ambulatory Visit | Attending: Radiation Oncology | Admitting: Radiation Oncology

## 2017-06-29 DIAGNOSIS — C3432 Malignant neoplasm of lower lobe, left bronchus or lung: Secondary | ICD-10-CM | POA: Diagnosis not present

## 2017-06-29 DIAGNOSIS — Z510 Encounter for antineoplastic radiation therapy: Secondary | ICD-10-CM | POA: Diagnosis not present

## 2017-06-29 DIAGNOSIS — E669 Obesity, unspecified: Secondary | ICD-10-CM | POA: Diagnosis not present

## 2017-06-29 DIAGNOSIS — J449 Chronic obstructive pulmonary disease, unspecified: Secondary | ICD-10-CM | POA: Diagnosis not present

## 2017-06-29 DIAGNOSIS — I5032 Chronic diastolic (congestive) heart failure: Secondary | ICD-10-CM | POA: Diagnosis not present

## 2017-06-29 DIAGNOSIS — Z9049 Acquired absence of other specified parts of digestive tract: Secondary | ICD-10-CM | POA: Diagnosis not present

## 2017-07-03 ENCOUNTER — Other Ambulatory Visit (HOSPITAL_BASED_OUTPATIENT_CLINIC_OR_DEPARTMENT_OTHER): Payer: Medicare Other

## 2017-07-03 ENCOUNTER — Ambulatory Visit (HOSPITAL_BASED_OUTPATIENT_CLINIC_OR_DEPARTMENT_OTHER): Payer: Medicare Other | Admitting: Internal Medicine

## 2017-07-03 ENCOUNTER — Encounter: Payer: Self-pay | Admitting: Internal Medicine

## 2017-07-03 ENCOUNTER — Ambulatory Visit
Admission: RE | Admit: 2017-07-03 | Discharge: 2017-07-03 | Disposition: A | Discharge status: Home / Self Care | Payer: Medicare Other | Source: Ambulatory Visit | Attending: Radiation Oncology | Admitting: Radiation Oncology

## 2017-07-03 ENCOUNTER — Ambulatory Visit (HOSPITAL_BASED_OUTPATIENT_CLINIC_OR_DEPARTMENT_OTHER): Payer: Medicare Other

## 2017-07-03 VITALS — BP 118/74 | HR 107 | Temp 98.1°F | Resp 18 | Wt 217.9 lb

## 2017-07-03 DIAGNOSIS — C3432 Malignant neoplasm of lower lobe, left bronchus or lung: Secondary | ICD-10-CM | POA: Diagnosis not present

## 2017-07-03 DIAGNOSIS — J449 Chronic obstructive pulmonary disease, unspecified: Secondary | ICD-10-CM

## 2017-07-03 DIAGNOSIS — E669 Obesity, unspecified: Secondary | ICD-10-CM | POA: Diagnosis not present

## 2017-07-03 DIAGNOSIS — Z5111 Encounter for antineoplastic chemotherapy: Secondary | ICD-10-CM

## 2017-07-03 DIAGNOSIS — Z510 Encounter for antineoplastic radiation therapy: Secondary | ICD-10-CM | POA: Diagnosis not present

## 2017-07-03 DIAGNOSIS — I5032 Chronic diastolic (congestive) heart failure: Secondary | ICD-10-CM | POA: Diagnosis not present

## 2017-07-03 DIAGNOSIS — Z9049 Acquired absence of other specified parts of digestive tract: Secondary | ICD-10-CM | POA: Diagnosis not present

## 2017-07-03 DIAGNOSIS — K869 Disease of pancreas, unspecified: Secondary | ICD-10-CM | POA: Diagnosis not present

## 2017-07-03 DIAGNOSIS — C3492 Malignant neoplasm of unspecified part of left bronchus or lung: Secondary | ICD-10-CM

## 2017-07-03 LAB — COMPREHENSIVE METABOLIC PANEL
ALBUMIN: 3.3 g/dL — AB (ref 3.5–5.0)
ALK PHOS: 75 U/L (ref 40–150)
ALT: 22 U/L (ref 0–55)
AST: 18 U/L (ref 5–34)
Anion Gap: 8 mEq/L (ref 3–11)
BILIRUBIN TOTAL: 0.48 mg/dL (ref 0.20–1.20)
BUN: 10.5 mg/dL (ref 7.0–26.0)
CO2: 29 mEq/L (ref 22–29)
Calcium: 9.4 mg/dL (ref 8.4–10.4)
Chloride: 97 mEq/L — ABNORMAL LOW (ref 98–109)
Creatinine: 1.1 mg/dL (ref 0.7–1.3)
EGFR: 88 mL/min/{1.73_m2} — AB (ref >90)
GLUCOSE: 100 mg/dL (ref 70–140)
Potassium: 3.7 mEq/L (ref 3.5–5.1)
SODIUM: 134 meq/L — AB (ref 136–145)
TOTAL PROTEIN: 7.1 g/dL (ref 6.4–8.3)

## 2017-07-03 LAB — CBC WITH DIFFERENTIAL/PLATELET
BASO%: 0.8 % (ref 0.0–2.0)
Basophils Absolute: 0 10*3/uL (ref 0.0–0.1)
EOS ABS: 0 10*3/uL (ref 0.0–0.5)
EOS%: 0.9 % (ref 0.0–7.0)
HCT: 39.2 % (ref 38.4–49.9)
HEMOGLOBIN: 12.9 g/dL — AB (ref 13.0–17.1)
LYMPH#: 0.2 10*3/uL — AB (ref 0.9–3.3)
LYMPH%: 11.7 % — ABNORMAL LOW (ref 14.0–49.0)
MCH: 27.1 pg — ABNORMAL LOW (ref 27.2–33.4)
MCHC: 32.9 g/dL (ref 32.0–36.0)
MCV: 82.3 fL (ref 79.3–98.0)
MONO#: 0.3 10*3/uL (ref 0.1–0.9)
MONO%: 16.6 % — ABNORMAL HIGH (ref 0.0–14.0)
NEUT%: 70 % (ref 39.0–75.0)
NEUTROS ABS: 1.4 10*3/uL — AB (ref 1.5–6.5)
Platelets: 123 10*3/uL — ABNORMAL LOW (ref 140–400)
RBC: 4.76 10*6/uL (ref 4.20–5.82)
RDW: 17.5 % — AB (ref 11.0–14.6)
WBC: 2 10*3/uL — AB (ref 4.0–10.3)

## 2017-07-03 MED ORDER — SODIUM CHLORIDE 0.9 % IV SOLN
Freq: Once | INTRAVENOUS | Status: AC
  Administered 2017-07-03: 12:00:00 via INTRAVENOUS

## 2017-07-03 MED ORDER — SODIUM CHLORIDE 0.9 % IV SOLN
255.6000 mg | Freq: Once | INTRAVENOUS | Status: AC
  Administered 2017-07-03: 260 mg via INTRAVENOUS
  Filled 2017-07-03: qty 26

## 2017-07-03 MED ORDER — PACLITAXEL CHEMO INJECTION 300 MG/50ML
45.0000 mg/m2 | Freq: Once | INTRAVENOUS | Status: AC
  Administered 2017-07-03: 96 mg via INTRAVENOUS
  Filled 2017-07-03: qty 16

## 2017-07-03 MED ORDER — SODIUM CHLORIDE 0.9 % IV SOLN
20.0000 mg | Freq: Once | INTRAVENOUS | Status: AC
  Administered 2017-07-03: 20 mg via INTRAVENOUS
  Filled 2017-07-03: qty 2

## 2017-07-03 MED ORDER — PALONOSETRON HCL INJECTION 0.25 MG/5ML
INTRAVENOUS | Status: AC
  Filled 2017-07-03: qty 5

## 2017-07-03 MED ORDER — DIPHENHYDRAMINE HCL 50 MG/ML IJ SOLN
INTRAMUSCULAR | Status: AC
  Filled 2017-07-03: qty 1

## 2017-07-03 MED ORDER — FAMOTIDINE IN NACL 20-0.9 MG/50ML-% IV SOLN
INTRAVENOUS | Status: AC
  Filled 2017-07-03: qty 50

## 2017-07-03 MED ORDER — PALONOSETRON HCL INJECTION 0.25 MG/5ML
0.2500 mg | Freq: Once | INTRAVENOUS | Status: AC
  Administered 2017-07-03: 0.25 mg via INTRAVENOUS

## 2017-07-03 MED ORDER — DIPHENHYDRAMINE HCL 50 MG/ML IJ SOLN
50.0000 mg | Freq: Once | INTRAMUSCULAR | Status: AC
  Administered 2017-07-03: 50 mg via INTRAVENOUS

## 2017-07-03 MED ORDER — FAMOTIDINE IN NACL 20-0.9 MG/50ML-% IV SOLN
20.0000 mg | Freq: Once | INTRAVENOUS | Status: AC
  Administered 2017-07-03: 20 mg via INTRAVENOUS

## 2017-07-03 NOTE — Progress Notes (Signed)
Okay to treatment with todays labs ANC 1.4 per Dr Julien Nordmann

## 2017-07-03 NOTE — Progress Notes (Signed)
[facility] [phone]   [phone]  OFFICE PROGRESS NOTE  James Beard, MD [address] [place]  [address]  DIAGNOSIS: Stage IIIA (T2a, N2, M0) non-small cell lung cancer, adenocarcinoma presented with left lower lobe lung mass in addition to mediastinal lymphadenopathy diagnosed in June 2018. The patient also has suspicious soft tissue density in the pancreatic head but this has been present for 3 years on previous CT scan and unlikely to be related to his current diagnosis of the lung cancer.  PRIOR THERAPY: None.  CURRENT THERAPY: Concurrent chemoradiation with weekly carboplatin for AUC of 2 and paclitaxel 45 MG/M2. Status post 6 cycles.  INTERVAL HISTORY: James Zamora 61 y.o. male returns to the clinic today for follow-up visit accompanied by his wife. The patient continues to tolerate his course of concurrent chemoradiation fairly well. He denied having any dysphagia or odynophagia. He denied having any chest pain, cough or hemoptysis but has baseline shortness breath and he is currently on home oxygen. He denied having any nausea, vomiting, diarrhea or constipation. He lost few pounds recently secondary to diuresis for his congestive heart failure. The patient is here today for evaluation before starting the last cycle of his concurrent chemoradiation.  MEDICAL HISTORY: Past Medical History:  Diagnosis Date  . Adenocarcinoma of left lung, stage 3 ([facility]) 05/10/2017  . Asthma   . Chronic diastolic CHF (congestive heart failure) ([facility])   . Lung mass   . Non-small cell lung cancer ([facility]) 11/13/2015  . Obesity   . Perforated bowel ([facility])     ALLERGIES:  is allergic to bee venom and penicillins.  MEDICATIONS:  Current Outpatient Prescriptions  Medication Sig Dispense Refill  . aspirin EC 81 MG EC tablet Take 1 tablet (81 mg total) by mouth daily. 30 tablet 1  . fluticasone furoate-vilanterol (BREO ELLIPTA) 200-25 MCG/INH AEPB  Inhale 1 puff into the lungs daily. 1 each 5  . furosemide (LASIX) 40 MG tablet Take 3 tablets (120 mg total) by mouth daily. (Patient taking differently: Take 20 mg by mouth daily. ) 90 tablet 6  . OXYGEN Inhale 3 L into the lungs continuous.    . potassium chloride SA (KLOR-CON M20) 20 MEQ tablet Take 1 tablet (20 mEq total) by mouth daily. 30 tablet 6  . prochlorperazine (COMPAZINE) 10 MG tablet Take 1 tablet (10 mg total) by mouth every 6 (six) hours as needed for nausea or vomiting. 30 tablet 0  . Wound Cleansers (RADIAPLEX EX) Apply 1 application topically at bedtime.     No current facility-administered medications for this visit.     SURGICAL HISTORY:  Past Surgical History:  Procedure Laterality Date  . APPENDECTOMY    . CHOLECYSTECTOMY    . COLONOSCOPY N/A 07/31/2016   Procedure: COLONOSCOPY;  Surgeon: Danie Binder, MD;  [facility];  Service: Endoscopy;  Laterality: N/A;  1:45 pm  . ESOPHAGOGASTRODUODENOSCOPY N/A 07/31/2016   Procedure: ESOPHAGOGASTRODUODENOSCOPY (EGD);  Surgeon: Danie Binder, MD;  [facility];  Service: Endoscopy;  Laterality: N/A;  . EUS N/A 06/07/2017   Procedure: UPPER ENDOSCOPIC ULTRASOUND (EUS) LINEAR;  Surgeon: Milus Banister, MD;  Location[facility];  Service: Endoscopy;  Laterality: N/A;  . HERNIA REPAIR      REVIEW OF SYSTEMS:  A comprehensive review of systems was negative except for: Respiratory: positive for dyspnea on exertion   PHYSICAL EXAMINATION: General appearance: alert, cooperative, fatigued and no  distress Head: Normocephalic, without obvious abnormality, atraumatic Neck: no adenopathy, no JVD, supple, symmetrical, trachea midline and thyroid not enlarged, symmetric, no tenderness/mass/nodules Lymph nodes: Cervical, supraclavicular, and axillary nodes normal. Resp: clear to auscultation bilaterally Back: symmetric, no curvature. ROM normal. No CVA tenderness. Cardio: regular rate and rhythm, S1, S2 normal,  no murmur, click, rub or gallop GI: soft, non-tender; bowel sounds normal; no masses,  no organomegaly Extremities: extremities normal, atraumatic, no cyanosis or edema  ECOG PERFORMANCE STATUS: 1 - Symptomatic but completely ambulatory  Blood pressure 118/74, pulse (!) 107, temperature 98.1 F (36.7 C), temperature source Oral, resp. rate 18, weight 217 lb 14.4 oz (98.8 kg), SpO2 91 %.  LABORATORY DATA: Lab Results  Component Value Date   WBC 2.0 (L) 07/03/2017   HGB 12.9 (L) 07/03/2017   HCT 39.2 07/03/2017   MCV 82.3 07/03/2017   PLT 123 (L) 07/03/2017      Chemistry      Component Value Date/Time   NA 134 (L) 07/03/2017 1010   K 3.7 07/03/2017 1010   CL 100 (L) 03/08/2017 1851   CO2 29 07/03/2017 1010   BUN 10.5 07/03/2017 1010   CREATININE 1.1 07/03/2017 1010      Component Value Date/Time   CALCIUM 9.4 07/03/2017 1010   ALKPHOS 75 07/03/2017 1010   AST 18 07/03/2017 1010   ALT 22 07/03/2017 1010   BILITOT 0.48 07/03/2017 1010       RADIOGRAPHIC STUDIES: No results found.  ASSESSMENT AND PLAN:  This is a very pleasant 61 years old African-American male recently diagnosed with a stage IIIa non-small cell lung cancer, adenocarcinoma presented with left lower lobe lung mass in addition to mediastinal lymphadenopathy. The patient is currently undergoing a course of concurrent chemoradiation with weekly carboplatin and paclitaxel. Status post 6 cycles.  The patient has been tolerating this treatment fairly well. I recommended for him to proceed with cycle #7 today as scheduled. His last fraction of radiotherapy is on 07/06/2017. I will see the patient back for follow-up visit in one month's for evaluation after repeating CT scan of the chest for restaging of his disease. He was advised to call immediately if he has any concerning symptoms in the interval. The patient voices understanding of current disease status and treatment options and is in agreement with the  current care plan. All questions were answered. The patient knows to call the clinic with any problems, questions or concerns. We can certainly see the patient much sooner if necessary. I spent 10 minutes counseling the patient face to face. The total time spent in the appointment was 15 minutes.  Disclaimer: This note was dictated with voice recognition software. Similar sounding words can inadvertently be transcribed and may not be corrected upon review.

## 2017-07-04 ENCOUNTER — Ambulatory Visit
Admission: RE | Admit: 2017-07-04 | Discharge: 2017-07-04 | Disposition: A | Discharge status: Home / Self Care | Payer: Medicare Other | Source: Ambulatory Visit | Attending: Radiation Oncology | Admitting: Radiation Oncology

## 2017-07-04 ENCOUNTER — Telehealth: Payer: Self-pay | Admitting: Internal Medicine

## 2017-07-04 DIAGNOSIS — Z9049 Acquired absence of other specified parts of digestive tract: Secondary | ICD-10-CM | POA: Diagnosis not present

## 2017-07-04 DIAGNOSIS — E669 Obesity, unspecified: Secondary | ICD-10-CM | POA: Diagnosis not present

## 2017-07-04 DIAGNOSIS — Z510 Encounter for antineoplastic radiation therapy: Secondary | ICD-10-CM | POA: Diagnosis not present

## 2017-07-04 DIAGNOSIS — I5032 Chronic diastolic (congestive) heart failure: Secondary | ICD-10-CM | POA: Diagnosis not present

## 2017-07-04 DIAGNOSIS — J449 Chronic obstructive pulmonary disease, unspecified: Secondary | ICD-10-CM | POA: Diagnosis not present

## 2017-07-04 DIAGNOSIS — C3432 Malignant neoplasm of lower lobe, left bronchus or lung: Secondary | ICD-10-CM | POA: Diagnosis not present

## 2017-07-04 NOTE — Telephone Encounter (Signed)
Scheduled appt per 9/4 los - lab and ct/ follow up in one month - reminder letter sent in the mail.

## 2017-07-05 ENCOUNTER — Ambulatory Visit: Payer: Medicare Other

## 2017-07-05 ENCOUNTER — Ambulatory Visit
Admission: RE | Admit: 2017-07-05 | Discharge: 2017-07-05 | Disposition: A | Discharge status: Home / Self Care | Payer: Medicare Other | Source: Ambulatory Visit | Attending: Radiation Oncology | Admitting: Radiation Oncology

## 2017-07-05 DIAGNOSIS — E669 Obesity, unspecified: Secondary | ICD-10-CM | POA: Diagnosis not present

## 2017-07-05 DIAGNOSIS — I5032 Chronic diastolic (congestive) heart failure: Secondary | ICD-10-CM | POA: Diagnosis not present

## 2017-07-05 DIAGNOSIS — Z9049 Acquired absence of other specified parts of digestive tract: Secondary | ICD-10-CM | POA: Diagnosis not present

## 2017-07-05 DIAGNOSIS — C3432 Malignant neoplasm of lower lobe, left bronchus or lung: Secondary | ICD-10-CM | POA: Diagnosis not present

## 2017-07-05 DIAGNOSIS — Z510 Encounter for antineoplastic radiation therapy: Secondary | ICD-10-CM | POA: Diagnosis not present

## 2017-07-05 DIAGNOSIS — J449 Chronic obstructive pulmonary disease, unspecified: Secondary | ICD-10-CM | POA: Diagnosis not present

## 2017-07-06 ENCOUNTER — Encounter: Payer: Self-pay | Admitting: Radiation Oncology

## 2017-07-06 ENCOUNTER — Ambulatory Visit
Admission: RE | Admit: 2017-07-06 | Discharge: 2017-07-06 | Disposition: A | Discharge status: Home / Self Care | Payer: Medicare Other | Source: Ambulatory Visit | Attending: Radiation Oncology | Admitting: Radiation Oncology

## 2017-07-06 ENCOUNTER — Other Ambulatory Visit: Payer: Self-pay | Admitting: Radiation Oncology

## 2017-07-06 ENCOUNTER — Ambulatory Visit: Payer: Medicare Other

## 2017-07-06 DIAGNOSIS — I5032 Chronic diastolic (congestive) heart failure: Secondary | ICD-10-CM | POA: Diagnosis not present

## 2017-07-06 DIAGNOSIS — C3432 Malignant neoplasm of lower lobe, left bronchus or lung: Secondary | ICD-10-CM | POA: Diagnosis not present

## 2017-07-06 DIAGNOSIS — Z510 Encounter for antineoplastic radiation therapy: Secondary | ICD-10-CM | POA: Diagnosis not present

## 2017-07-06 DIAGNOSIS — C3492 Malignant neoplasm of unspecified part of left bronchus or lung: Secondary | ICD-10-CM

## 2017-07-06 DIAGNOSIS — E669 Obesity, unspecified: Secondary | ICD-10-CM | POA: Diagnosis not present

## 2017-07-06 DIAGNOSIS — Z9049 Acquired absence of other specified parts of digestive tract: Secondary | ICD-10-CM | POA: Diagnosis not present

## 2017-07-06 DIAGNOSIS — J449 Chronic obstructive pulmonary disease, unspecified: Secondary | ICD-10-CM | POA: Diagnosis not present

## 2017-07-06 MED ORDER — SUCRALFATE 1 G PO TABS: 1.0000 g | ORAL_TABLET | Freq: Three times a day (TID) | ORAL | 2 refills | Status: DC

## 2017-07-06 NOTE — Progress Notes (Signed)
°  Radiation Oncology         [phone]) [phone] ________________________________  Name: James Zamora MRN: [id number]  Date: 07/06/2017  DOB: 06/22/1956   End of Treatment Note  Diagnosis:   61 y.o. male with Stage III NSCLC, adenocarcinoma of the left lower lung.     Indication for treatment:  Curative, Chemo-Radiotherapy       Radiation treatment dates:   05/21/2017 to 07/06/2017  Site/dose:   The primary tumor and involved mediastinal adenopathy were treated to 66 Gy in 33 fractions of 2 Gy.  Beams/energy:   A five field 3D conformal treatment arrangement was used delivering 6 and 10 MV photons.  Daily image-guidance CT was used to align the treatment with the targeted volume  Narrative: The patient tolerated radiation treatment relatively well.  The patient experienced modest fatigue. He denied cough, chest pain or increased shortness of breath. He did develop some mild hyperpigmentation without desquamation of the left upper back and mild dysphagia near the end of treatment.  Plan: The patient has completed radiation treatment. I have written a prescription today for Carafate for difficulty swallowing. The patient will contact our clinic if difficulty swallowing does not improve or worsens. The patient will return to radiation oncology clinic for routine followup in one month. I advised him to call or return sooner if he has any questions or concerns related to his recovery or treatment.  ________________________________  Sheral Apley. Tammi Klippel, M.D.  This document serves as a record of services personally performed by Tyler Pita, MD. It was created on his behalf by Arlyce Harman, a trained medical scribe. The creation of this record is based on the scribe's personal observations and the provider's statements to them. This document has been checked and approved by the attending provider.

## 2017-07-09 ENCOUNTER — Ambulatory Visit: Payer: Medicare Other

## 2017-07-10 ENCOUNTER — Other Ambulatory Visit: Payer: Self-pay | Admitting: Student

## 2017-07-11 ENCOUNTER — Ambulatory Visit (HOSPITAL_COMMUNITY)
Admission: RE | Admit: 2017-07-11 | Discharge: 2017-07-11 | Disposition: A | Discharge status: Home / Self Care | Payer: Medicare Other | Source: Ambulatory Visit | Attending: Internal Medicine | Admitting: Internal Medicine

## 2017-07-11 ENCOUNTER — Other Ambulatory Visit: Payer: Self-pay | Admitting: Internal Medicine

## 2017-07-11 ENCOUNTER — Encounter (HOSPITAL_COMMUNITY): Payer: Self-pay

## 2017-07-11 DIAGNOSIS — C3432 Malignant neoplasm of lower lobe, left bronchus or lung: Secondary | ICD-10-CM | POA: Insufficient documentation

## 2017-07-11 DIAGNOSIS — Z7982 Long term (current) use of aspirin: Secondary | ICD-10-CM | POA: Insufficient documentation

## 2017-07-11 DIAGNOSIS — Z880 Allergy status to penicillin: Secondary | ICD-10-CM | POA: Insufficient documentation

## 2017-07-11 DIAGNOSIS — Z9981 Dependence on supplemental oxygen: Secondary | ICD-10-CM | POA: Insufficient documentation

## 2017-07-11 DIAGNOSIS — J45909 Unspecified asthma, uncomplicated: Secondary | ICD-10-CM | POA: Insufficient documentation

## 2017-07-11 DIAGNOSIS — E669 Obesity, unspecified: Secondary | ICD-10-CM | POA: Diagnosis not present

## 2017-07-11 DIAGNOSIS — I878 Other specified disorders of veins: Secondary | ICD-10-CM

## 2017-07-11 DIAGNOSIS — C3490 Malignant neoplasm of unspecified part of unspecified bronchus or lung: Secondary | ICD-10-CM | POA: Diagnosis not present

## 2017-07-11 DIAGNOSIS — I5032 Chronic diastolic (congestive) heart failure: Secondary | ICD-10-CM | POA: Diagnosis not present

## 2017-07-11 DIAGNOSIS — Z5111 Encounter for antineoplastic chemotherapy: Secondary | ICD-10-CM | POA: Diagnosis not present

## 2017-07-11 HISTORY — PX: IR FLUORO GUIDE PORT INSERTION RIGHT: IMG5741

## 2017-07-11 HISTORY — PX: IR US GUIDE VASC ACCESS RIGHT: IMG2390

## 2017-07-11 LAB — PROTIME-INR
INR: 1.07
Prothrombin Time: 13.8 seconds (ref 11.4–15.2)

## 2017-07-11 LAB — CBC
HEMATOCRIT: 40.3 % (ref 39.0–52.0)
Hemoglobin: 14.1 g/dL (ref 13.0–17.0)
MCH: 27.1 pg (ref 26.0–34.0)
MCHC: 35 g/dL (ref 30.0–36.0)
MCV: 77.4 fL — AB (ref 78.0–100.0)
PLATELETS: 122 10*3/uL — AB (ref 150–400)
RBC: 5.21 MIL/uL (ref 4.22–5.81)
RDW: 15.9 % — AB (ref 11.5–15.5)
WBC: 2.7 10*3/uL — AB (ref 4.0–10.5)

## 2017-07-11 LAB — APTT: aPTT: 27 seconds (ref 24–36)

## 2017-07-11 MED ORDER — SODIUM CHLORIDE 0.9 % IV SOLN
INTRAVENOUS | Status: DC
  Administered 2017-07-11: 12:00:00 via INTRAVENOUS

## 2017-07-11 MED ORDER — MIDAZOLAM HCL 2 MG/2ML IJ SOLN
INTRAMUSCULAR | Status: AC
  Filled 2017-07-11: qty 4

## 2017-07-11 MED ORDER — LIDOCAINE-EPINEPHRINE (PF) 2 %-1:200000 IJ SOLN
INTRAMUSCULAR | Status: DC
Start: 2017-07-11 — End: 2017-07-12
  Filled 2017-07-11: qty 20

## 2017-07-11 MED ORDER — HEPARIN SOD (PORK) LOCK FLUSH 100 UNIT/ML IV SOLN
INTRAVENOUS | Status: AC
  Filled 2017-07-11: qty 5

## 2017-07-11 MED ORDER — LIDOCAINE HCL (PF) 2 % IJ SOLN
INTRAMUSCULAR | Status: AC
  Filled 2017-07-11: qty 10

## 2017-07-11 MED ORDER — HEPARIN SOD (PORK) LOCK FLUSH 100 UNIT/ML IV SOLN
INTRAVENOUS | Status: AC | PRN
  Administered 2017-07-11: 500 [IU] via INTRAVENOUS

## 2017-07-11 MED ORDER — FENTANYL CITRATE (PF) 100 MCG/2ML IJ SOLN
INTRAMUSCULAR | Status: AC | PRN
  Administered 2017-07-11 (×2): 50 ug via INTRAVENOUS

## 2017-07-11 MED ORDER — LIDOCAINE-EPINEPHRINE (PF) 2 %-1:200000 IJ SOLN
INTRAMUSCULAR | Status: AC | PRN
  Administered 2017-07-11: 20 mL

## 2017-07-11 MED ORDER — VANCOMYCIN HCL IN DEXTROSE 1-5 GM/200ML-% IV SOLN
INTRAVENOUS | Status: AC
  Administered 2017-07-11: 1000 mg via INTRAVENOUS
  Filled 2017-07-11: qty 200

## 2017-07-11 MED ORDER — VANCOMYCIN HCL IN DEXTROSE 1-5 GM/200ML-% IV SOLN
1000.0000 mg | Freq: Once | INTRAVENOUS | Status: AC
  Administered 2017-07-11: 1000 mg via INTRAVENOUS

## 2017-07-11 MED ORDER — MIDAZOLAM HCL 2 MG/2ML IJ SOLN
INTRAMUSCULAR | Status: AC | PRN
  Administered 2017-07-11 (×2): 1 mg via INTRAVENOUS

## 2017-07-11 MED ORDER — FENTANYL CITRATE (PF) 100 MCG/2ML IJ SOLN
INTRAMUSCULAR | Status: AC
  Filled 2017-07-11: qty 4

## 2017-07-11 NOTE — H&P (Signed)
Referring Physician(s): Mohamed,Mohamed  Supervising Physician: Daryll Brod  Patient Status:  [facility]  Chief Complaint:  "I'm here for a chest catheter"  Subjective: Pt familiar to IR service from prior left lung mass biopsy on 04/24/17. He has a hx of stage IIIA adenocarcinoma of left lung and presented with left lower lobe lung mass in addition to mediastinal lymphadenopathy diagnosed in June 2018. He has undergone chemoradiation. He also has suspicious soft tissue density in the pancreatic head but this has been present for 3 years on previous CT scan and unlikely to be related to his current diagnosis of the lung cancer. He has poor venous access and presents again today for port a cath placement. He denies fever,HA, cough, abd/back pain, nausea or bleeding. He does have occ chest discomfort, difficulty swallowing at times (" food gets stuck"), dyspnea- on home O2.  Past Medical History:  Diagnosis Date  . Adenocarcinoma of left lung, stage 3 ([place]) 05/10/2017  . Asthma   . Chronic diastolic CHF (congestive heart failure) ([place])   . Lung mass   . Non-small cell lung cancer ([place]) 11/13/2015  . Obesity   . Perforated bowel [facility])    Past Surgical History:  Procedure Laterality Date  . APPENDECTOMY    . CHOLECYSTECTOMY    . COLONOSCOPY N/A 07/31/2016   Procedure: COLONOSCOPY;  Surgeon: Danie Binder, MD;  Location: [facility];  Service: Endoscopy;  Laterality: N/A;  1:45 pm  . ESOPHAGOGASTRODUODENOSCOPY N/A 07/31/2016   Procedure: ESOPHAGOGASTRODUODENOSCOPY (EGD);  Surgeon: Danie Binder, MD;  Location: [facility] END[facility] SU[facility];  Service: Endoscopy;  Laterality: N/A;  . EUS N/A 06/07/2017   Procedure: UPPER ENDOSCOPIC ULTRASOUND (EUS) LINEAR;  Surgeon: Milus Banister, MD;  Location: [facility];  Service: Endoscopy;  Laterality: N/A;  . HERNIA REPAIR       Allergies: Bee venom and Penicillins  Medications: Prior to Admission medications   Medication Sig Start Date End Date  Taking? Authorizing Provider  aspirin EC 81 MG EC tablet Take 1 tablet (81 mg total) by mouth daily. 06/12/16  Yes Kathie Dike, MD  fluticasone furoate-vilanterol (BREO ELLIPTA) 200-25 MCG/INH AEPB Inhale 1 puff into the lungs daily. 03/30/17  Yes Parrett, Tammy S, NP  furosemide (LASIX) 40 MG tablet Take 3 tablets (120 mg total) by mouth daily. Patient taking differently: Take 20 mg by mouth daily.  04/13/17  Yes Lendon Colonel, NP  OXYGEN Inhale 3 L into the lungs continuous.   Yes [provider]  potassium chloride SA (KLOR-CON M20) 20 MEQ tablet Take 1 tablet (20 mEq total) by mouth daily. 04/13/17 07/12/17 Yes Lendon Colonel, NP  prochlorperazine (COMPAZINE) 10 MG tablet Take 1 tablet (10 mg total) by mouth every 6 (six) hours as needed for nausea or vomiting. 05/21/17  Yes Curt Bears, MD  sucralfate (CARAFATE) 1 g tablet Take 1 tablet (1 g total) by mouth 4 (four) times daily -  with meals and at bedtime. 5 min before meals for radiation induced esophagitis 07/06/17  Yes Tyler Pita, MD  Wound Cleansers (RADIAPLEX EX) Apply 1 application topically at bedtime.   Yes [provider]     Vital Signs: BP 123/81 (BP Location: Left Arm)   Pulse (!) 111   Temp (!) 97.3 F (36.3 C) (Oral)   Resp 20   SpO2 93%   Physical Exam awake/alert; chest- distant BS bilat; heart- sl tachy but reg rhythm; abd- obese, soft,+BS,NT; LE -no sig edema.  Imaging: No results found.  Labs:  CBC:  Recent Labs  06/18/17 1014 06/25/17 0955 07/03/17 1010 07/11/17 1204  WBC 3.0* 4.6 2.0* 2.7*  HGB 13.2 14.5 12.9* 14.1  HCT 41.6 44.4 39.2 40.3  PLT 142 102* 123* 122*    COAGS:  Recent Labs  04/24/17 0655 07/11/17 1204  INR 1.03 1.07  APTT 26 27    BMP:  Recent Labs  11/30/16 1448 01/10/17 1545  02/20/17 2323 03/08/17 1851 05/16/17 1730  06/12/17 0855 06/18/17 1015 06/25/17 0955 07/03/17 1010  NA 140 140  --  135 138  --   < > 136 136 133* 134*    K 4.3 3.9  --  4.2 4.6  --   < > 4.2 4.6 3.6 3.7  CL 102 99  --  94* 100*  --   --   --   --   --   --   CO2 32* 34*  --  30 31  --   < > 27 29 25 29   GLUCOSE 97 111*  --  141* 87  --   < > 94 99 108 100  BUN 10 13  --  39* 25*  --   < > 14.3 16.0 15.9 10.5  CALCIUM 9.0 9.3  --  9.5 9.2  --   < > 9.5 9.5 9.7 9.4  CREATININE 1.13 1.21  < > 1.57* 1.19 1.37*  < > 1.1 1.1 1.2 1.1  GFRNONAA  --   --   --  46* >60 54*  --   --   --   --   --   GFRAA  --   --   --  54* >60 >60  --   --   --   --   --   < > = values in this interval not displayed.  LIVER FUNCTION TESTS:  Recent Labs  06/12/17 0855 06/18/17 1015 06/25/17 0955 07/03/17 1010  BILITOT 0.64 0.34 0.70 0.48  AST 17 15 14 18   ALT 14 14 15 22   ALKPHOS 88 86 92 75  PROT 7.6 7.3 8.0 7.1  ALBUMIN 3.5 3.4* 3.8 3.3*    Assessment and Plan: Pt with hx of stage IIIA adenocarcinoma of left lung who presented with left lower lobe lung mass in addition to mediastinal lymphadenopathy diagnosed in June 2018. He has undergone chemoradiation. He also has suspicious soft tissue density in the pancreatic head but this has been present for 3 years on previous CT scan and unlikely to be related to his current diagnosis of the lung cancer. He has poor venous access and presents today for port a cath placement.Risks and benefits discussed with the patient/spouse including, but not limited to bleeding, infection, pneumothorax, or fibrin sheath development and need for additional procedures.All of the patient's questions were answered, patient is agreeable to proceed. Consent signed and in chart. WBC 2.7 today.      Electronically Signed: D. Rowe Robert, PA-C 07/11/2017, 1:01 PM   I spent a total of 20 minutes at the the patient's bedside AND on the patient's hospital floor or unit, greater than 50% of which was counseling/coordinating care for port a cath placement

## 2017-07-11 NOTE — Procedures (Signed)
Lung ca  S/p RT IJ POWER PORT  TIPSVCRA NO COMP STABLE EBL 0 READY FOR USE FULL REPORT IN PACS

## 2017-07-11 NOTE — Discharge Instructions (Signed)
Implanted Port Insertion, Care After °This sheet gives you information about how to care for yourself after your procedure. Your health care provider may also give you more specific instructions. If you have problems or questions, contact your health care provider. °What can I expect after the procedure? °After your procedure, it is common to have: °· Discomfort at the port insertion site. °· Bruising on the skin over the port. This should improve over 3-4 days. ° °Follow these instructions at home: °Port care °· After your port is placed, you will get a manufacturer's information card. The card has information about your port. Keep this card with you at all times. °· Take care of the port as told by your health care provider. Ask your health care provider if you or a family member can get training for taking care of the port at home. A home health care nurse may also take care of the port. °· Make sure to remember what type of port you have. °Incision care °· Follow instructions from your health care provider about how to take care of your port insertion site. Make sure you: °? Wash your hands with soap and water before you change your bandage (dressing). If soap and water are not available, use hand sanitizer. °? Change your dressing as told by your health care provider. °? Leave stitches (sutures), skin glue, or adhesive strips in place. These skin closures may need to stay in place for 2 weeks or longer. If adhesive strip edges start to loosen and curl up, you may trim the loose edges. Do not remove adhesive strips completely unless your health care provider tells you to do that. °· Check your port insertion site every day for signs of infection. Check for: °? More redness, swelling, or pain. °? More fluid or blood. °? Warmth. °? Pus or a bad smell. °General instructions °· Do not take baths, swim, or use a hot tub until your health care provider approves. °· Do not lift anything that is heavier than 10 lb (4.5  kg) for a week, or as told by your health care provider. °· Ask your health care provider when it is okay to: °? Return to work or school. °? Resume usual physical activities or sports. °· Do not drive for 24 hours if you were given a medicine to help you relax (sedative). °· Take over-the-counter and prescription medicines only as told by your health care provider. °· Wear a medical alert bracelet in case of an emergency. This will tell any health care providers that you have a port. °· Keep all follow-up visits as told by your health care provider. This is important. °Contact a health care provider if: °· You cannot flush your port with saline as directed, or you cannot draw blood from the port. °· You have a fever or chills. °· You have more redness, swelling, or pain around your port insertion site. °· You have more fluid or blood coming from your port insertion site. °· Your port insertion site feels warm to the touch. °· You have pus or a bad smell coming from the port insertion site. °Get help right away if: °· You have chest pain or shortness of breath. °· You have bleeding from your port that you cannot control. °Summary °· Take care of the port as told by your health care provider. °· Change your dressing as told by your health care provider. °· Keep all follow-up visits as told by your health care provider. °  This information is not intended to replace advice given to you by your health care provider. Make sure you discuss any questions you have with your health care provider. Document Released: 08/06/2013 Document Revised: 09/06/2016 Document Reviewed: 09/06/2016 Elsevier Interactive Patient Education  2017 [place] An implanted port is a type of central line that is placed under the skin. Central lines are used to provide IV access when treatment or nutrition needs to be given through a persons veins. Implanted ports are used for long-term IV access. An implanted port  may be placed because:  You need IV medicine that would be irritating to the small veins in your hands or arms.  You need long-term IV medicines, such as antibiotics.  You need IV nutrition for a long period.  You need frequent blood draws for lab tests.  You need dialysis.  Implanted ports are usually placed in the chest area, but they can also be placed in the upper arm, the abdomen, or the leg. An implanted port has two main parts:  Reservoir. The reservoir is round and will appear as a small, raised area under your skin. The reservoir is the part where a needle is inserted to give medicines or draw blood.  Catheter. The catheter is a thin, flexible tube that extends from the reservoir. The catheter is placed into a large vein. Medicine that is inserted into the reservoir goes into the catheter and then into the vein.  How will I care for my incision site? Do not get the incision site wet. Bathe or shower as directed by your health care provider. How is my port accessed? Special steps must be taken to access the port:  Before the port is accessed, a numbing cream can be placed on the skin. This helps numb the skin over the port site.  Your health care provider uses a sterile technique to access the port. ? Your health care provider must put on a mask and sterile gloves. ? The skin over your port is cleaned carefully with an antiseptic and allowed to dry. ? The port is gently pinched between sterile gloves, and a needle is inserted into the port.  Only "non-coring" port needles should be used to access the port. Once the port is accessed, a blood return should be checked. This helps ensure that the port is in the vein and is not clogged.  If your port needs to remain accessed for a constant infusion, a clear (transparent) bandage will be placed over the needle site. The bandage and needle will need to be changed every week, or as directed by your health care provider.  Keep the  bandage covering the needle clean and dry. Do not get it wet. Follow your health care providers instructions on how to take a shower or bath while the port is accessed.  If your port does not need to stay accessed, no bandage is needed over the port.  What is flushing? Flushing helps keep the port from getting clogged. Follow your health care providers instructions on how and when to flush the port. Ports are usually flushed with saline solution or a medicine called heparin. The need for flushing will depend on how the port is used.  If the port is used for intermittent medicines or blood draws, the port will need to be flushed: ? After medicines have been given. ? After blood has been drawn. ? As part of routine maintenance.  If a constant infusion is  running, the port may not need to be flushed.  How long will my port stay implanted? The port can stay in for as long as your health care provider thinks it is needed. When it is time for the port to come out, surgery will be done to remove it. The procedure is similar to the one performed when the port was put in. When should I seek immediate medical care? When you have an implanted port, you should seek immediate medical care if:  You notice a bad smell coming from the incision site.  You have swelling, redness, or drainage at the incision site.  You have more swelling or pain at the port site or the surrounding area.  You have a fever that is not controlled with medicine.  This information is not intended to replace advice given to you by your health care provider. Make sure you discuss any questions you have with your health care provider. Document Released: 10/16/2005 Document Revised: 03/23/2016 Document Reviewed: 06/23/2013 Elsevier Interactive Patient Education  2017 [place]. Moderate Conscious Sedation, Adult, Care After These instructions provide you with information about caring for yourself after your procedure. Your  health care provider may also give you more specific instructions. Your treatment has been planned according to current medical practices, but problems sometimes occur. Call your health care provider if you have any problems or questions after your procedure. What can I expect after the procedure? After your procedure, it is common:  To feel sleepy for several hours.  To feel clumsy and have poor balance for several hours.  To have poor judgment for several hours.  To vomit if you eat too soon.  Follow these instructions at home: For at least 24 hours after the procedure:   Do not: ? Participate in activities where you could fall or become injured. ? Drive. ? Use heavy machinery. ? Drink alcohol. ? Take sleeping pills or medicines that cause drowsiness. ? Make important decisions or sign legal documents. ? Take care of children on your own.  Rest. Eating and drinking  Follow the diet recommended by your health care provider.  If you vomit: ? Drink water, juice, or soup when you can drink without vomiting. ? Make sure you have little or no nausea before eating solid foods. General instructions  Have a responsible adult stay with you until you are awake and alert.  Take over-the-counter and prescription medicines only as told by your health care provider.  If you smoke, do not smoke without supervision.  Keep all follow-up visits as told by your health care provider. This is important. Contact a health care provider if:  You keep feeling nauseous or you keep vomiting.  You feel light-headed.  You develop a rash.  You have a fever. Get help right away if:  You have trouble breathing. This information is not intended to replace advice given to you by your health care provider. Make sure you discuss any questions you have with your health care provider. Document Released: 08/06/2013 Document Revised: 03/20/2016 Document Reviewed: 02/05/2016 Elsevier Interactive  Patient Education  [employer].

## 2017-07-12 ENCOUNTER — Encounter: Payer: Self-pay | Admitting: Urology

## 2017-07-12 ENCOUNTER — Other Ambulatory Visit: Payer: Self-pay | Admitting: Urology

## 2017-07-12 DIAGNOSIS — K209 Esophagitis, unspecified without bleeding: Secondary | ICD-10-CM

## 2017-07-12 MED ORDER — MAGIC MOUTHWASH W/LIDOCAINE: 10.0000 mL | Freq: Four times a day (QID) | ORAL | 0 refills | Status: DC | PRN

## 2017-07-12 NOTE — Progress Notes (Signed)
Per message from patient's sister, [name]. She reports [name] has increased pain and difficulty associated with swallowing. The final week of treatment Dr. Tammi Klippel prescribed Carafate. [name] reports this is no longer working. She is requesting a prescription for something stronger.   Diagnosis:  61 y.o. male with Stage IIINSCLC, adenocarcinoma of the left lower lung.     Indication for treatment: Curative, Chemo-Radiotherapy      Radiation treatment dates:  05/21/2017 to 07/06/2017    Site/dose:  The primary tumor and involved mediastinal adenopathy were treated to 66 Gy in 33 fractions of 2 Gy.   I have requested that London Pepper, RN call in a prescription of magic mouthwash to his pharmacy.  Order has been entered in the EMR.  Sam will call and inform patient when the prescription has been called so that they can proceed to pick this up and begin using immediately.   Nicholos Johns, PA-C

## 2017-07-13 ENCOUNTER — Telehealth: Payer: Self-pay | Admitting: Radiation Oncology

## 2017-07-13 NOTE — Telephone Encounter (Signed)
Per [employer], PA-C order I called in magic mouthwash with lidocaine.

## 2017-07-13 NOTE — Telephone Encounter (Signed)
Phoned Coralee Pesa, patient's sister and caregiver. Explained magic mouthwash with lidocaine has been called to [facility] in [place]. Stressed he continue to use Carafate alternating it with Magic Mouthwash. Encouraged the patient avoid salty, citrus or very acidic foods and drinks. Encouraged the patient consume only room temperature food and drink. Reassured her that her brother esophagitis should taper over the next week. She verbalized understanding and expressed appreciation for the call.

## 2017-07-13 NOTE — Telephone Encounter (Signed)
-----   Message from Freeman Caldron, [place] sent at 07/12/2017 12:05 PM EDT ----- Regarding: RE: Esophageal pain and difficulty swallowing James Zamora, I will put in an order for magic mouthwash with lidocaine to be called to his pharmacy.  Please inform the patient to pick this up after you have called it in. Thank you!! -Ashlyn ----- Message ----- From: Heywood Footman, RN Sent: 07/12/2017  11:43 AM To: Tyler Pita, MD, Freeman Caldron, PA-C Subject: Esophageal pain and difficulty swallowing      Ashlyn.  I received a call from the patient's sister, [name]. She reports Lucile has increased pain and difficulty associated with swallowing. The final week of treatment Dr. Tammi Klippel prescribed Carafate. [name] reports this is no longer working. She is requesting a prescription for something stronger.   Diagnosis:   61 y.o. male with Stage IIINSCLC, adenocarcinoma of the left lower lung.     Indication for treatment:  Curative, Chemo-Radiotherapy       Radiation treatment dates:   05/21/2017 to 07/06/2017  Site/dose:   The primary tumor and involved mediastinal adenopathy were treated to 66 Gy in 33 fractions of 2 Gy.  James Zamora

## 2017-07-30 ENCOUNTER — Other Ambulatory Visit (HOSPITAL_BASED_OUTPATIENT_CLINIC_OR_DEPARTMENT_OTHER): Payer: Medicare Other

## 2017-07-30 ENCOUNTER — Encounter (HOSPITAL_COMMUNITY): Payer: Self-pay | Admitting: Emergency Medicine

## 2017-07-30 ENCOUNTER — Other Ambulatory Visit: Payer: Self-pay

## 2017-07-30 ENCOUNTER — Encounter (HOSPITAL_COMMUNITY): Payer: Self-pay

## 2017-07-30 ENCOUNTER — Telehealth: Payer: Self-pay | Admitting: *Deleted

## 2017-07-30 ENCOUNTER — Inpatient Hospital Stay (HOSPITAL_COMMUNITY)
Admission: EM | Admit: 2017-07-30 | Discharge: 2017-08-01 | DRG: 176 | Disposition: A | Discharge status: Home / Self Care | Payer: Medicare Other | Attending: Internal Medicine | Admitting: Internal Medicine

## 2017-07-30 ENCOUNTER — Ambulatory Visit (HOSPITAL_COMMUNITY)
Admission: RE | Admit: 2017-07-30 | Discharge: 2017-07-30 | Disposition: A | Discharge status: Home / Self Care | Payer: Medicare Other | Source: Ambulatory Visit | Attending: Internal Medicine | Admitting: Internal Medicine

## 2017-07-30 DIAGNOSIS — J9611 Chronic respiratory failure with hypoxia: Secondary | ICD-10-CM | POA: Diagnosis present

## 2017-07-30 DIAGNOSIS — Z9889 Other specified postprocedural states: Secondary | ICD-10-CM

## 2017-07-30 DIAGNOSIS — J449 Chronic obstructive pulmonary disease, unspecified: Secondary | ICD-10-CM | POA: Diagnosis not present

## 2017-07-30 DIAGNOSIS — Z87891 Personal history of nicotine dependence: Secondary | ICD-10-CM | POA: Diagnosis not present

## 2017-07-30 DIAGNOSIS — E669 Obesity, unspecified: Secondary | ICD-10-CM | POA: Diagnosis present

## 2017-07-30 DIAGNOSIS — T451X5A Adverse effect of antineoplastic and immunosuppressive drugs, initial encounter: Secondary | ICD-10-CM | POA: Diagnosis present

## 2017-07-30 DIAGNOSIS — I82431 Acute embolism and thrombosis of right popliteal vein: Secondary | ICD-10-CM | POA: Diagnosis present

## 2017-07-30 DIAGNOSIS — Z7951 Long term (current) use of inhaled steroids: Secondary | ICD-10-CM

## 2017-07-30 DIAGNOSIS — Z8249 Family history of ischemic heart disease and other diseases of the circulatory system: Secondary | ICD-10-CM

## 2017-07-30 DIAGNOSIS — C3432 Malignant neoplasm of lower lobe, left bronchus or lung: Secondary | ICD-10-CM

## 2017-07-30 DIAGNOSIS — I700 Atherosclerosis of aorta: Secondary | ICD-10-CM | POA: Diagnosis not present

## 2017-07-30 DIAGNOSIS — D696 Thrombocytopenia, unspecified: Secondary | ICD-10-CM | POA: Diagnosis present

## 2017-07-30 DIAGNOSIS — Z7982 Long term (current) use of aspirin: Secondary | ICD-10-CM | POA: Diagnosis not present

## 2017-07-30 DIAGNOSIS — Z833 Family history of diabetes mellitus: Secondary | ICD-10-CM | POA: Diagnosis not present

## 2017-07-30 DIAGNOSIS — C3492 Malignant neoplasm of unspecified part of left bronchus or lung: Secondary | ICD-10-CM | POA: Diagnosis present

## 2017-07-30 DIAGNOSIS — I5032 Chronic diastolic (congestive) heart failure: Secondary | ICD-10-CM | POA: Diagnosis present

## 2017-07-30 DIAGNOSIS — Z9049 Acquired absence of other specified parts of digestive tract: Secondary | ICD-10-CM | POA: Diagnosis not present

## 2017-07-30 DIAGNOSIS — Z8042 Family history of malignant neoplasm of prostate: Secondary | ICD-10-CM | POA: Diagnosis not present

## 2017-07-30 DIAGNOSIS — Z825 Family history of asthma and other chronic lower respiratory diseases: Secondary | ICD-10-CM | POA: Diagnosis not present

## 2017-07-30 DIAGNOSIS — D649 Anemia, unspecified: Secondary | ICD-10-CM | POA: Diagnosis present

## 2017-07-30 DIAGNOSIS — Z5111 Encounter for antineoplastic chemotherapy: Secondary | ICD-10-CM

## 2017-07-30 DIAGNOSIS — Z923 Personal history of irradiation: Secondary | ICD-10-CM

## 2017-07-30 DIAGNOSIS — I2692 Saddle embolus of pulmonary artery without acute cor pulmonale: Secondary | ICD-10-CM | POA: Diagnosis not present

## 2017-07-30 DIAGNOSIS — D6959 Other secondary thrombocytopenia: Secondary | ICD-10-CM | POA: Diagnosis present

## 2017-07-30 DIAGNOSIS — R609 Edema, unspecified: Secondary | ICD-10-CM

## 2017-07-30 DIAGNOSIS — E876 Hypokalemia: Secondary | ICD-10-CM | POA: Diagnosis present

## 2017-07-30 DIAGNOSIS — Z880 Allergy status to penicillin: Secondary | ICD-10-CM | POA: Diagnosis not present

## 2017-07-30 DIAGNOSIS — K869 Disease of pancreas, unspecified: Secondary | ICD-10-CM | POA: Diagnosis present

## 2017-07-30 DIAGNOSIS — Z79899 Other long term (current) drug therapy: Secondary | ICD-10-CM | POA: Diagnosis not present

## 2017-07-30 DIAGNOSIS — J9610 Chronic respiratory failure, unspecified whether with hypoxia or hypercapnia: Secondary | ICD-10-CM | POA: Diagnosis present

## 2017-07-30 DIAGNOSIS — I2699 Other pulmonary embolism without acute cor pulmonale: Secondary | ICD-10-CM | POA: Diagnosis not present

## 2017-07-30 DIAGNOSIS — J45909 Unspecified asthma, uncomplicated: Secondary | ICD-10-CM | POA: Diagnosis present

## 2017-07-30 DIAGNOSIS — Z9981 Dependence on supplemental oxygen: Secondary | ICD-10-CM

## 2017-07-30 DIAGNOSIS — Z23 Encounter for immunization: Secondary | ICD-10-CM | POA: Diagnosis not present

## 2017-07-30 HISTORY — DX: Atherosclerosis of aorta: I70.0

## 2017-07-30 LAB — CBC WITH DIFFERENTIAL/PLATELET
BASO%: 0.2 % (ref 0.0–2.0)
BASOS ABS: 0 10*3/uL (ref 0.0–0.1)
EOS ABS: 0.1 10*3/uL (ref 0.0–0.5)
EOS%: 2.1 % (ref 0.0–7.0)
HEMATOCRIT: 37.9 % — AB (ref 38.4–49.9)
HGB: 12.4 g/dL — ABNORMAL LOW (ref 13.0–17.1)
LYMPH#: 0.6 10*3/uL — AB (ref 0.9–3.3)
LYMPH%: 13.6 % — ABNORMAL LOW (ref 14.0–49.0)
MCH: 26.8 pg — AB (ref 27.2–33.4)
MCHC: 32.7 g/dL (ref 32.0–36.0)
MCV: 82 fL (ref 79.3–98.0)
MONO#: 0.8 10*3/uL (ref 0.1–0.9)
MONO%: 18.6 % — ABNORMAL HIGH (ref 0.0–14.0)
NEUT#: 2.9 10*3/uL (ref 1.5–6.5)
NEUT%: 65.5 % (ref 39.0–75.0)
PLATELETS: 129 10*3/uL — AB (ref 140–400)
RBC: 4.62 10*6/uL (ref 4.20–5.82)
RDW: 16.3 % — ABNORMAL HIGH (ref 11.0–14.6)
WBC: 4.4 10*3/uL (ref 4.0–10.3)

## 2017-07-30 LAB — COMPREHENSIVE METABOLIC PANEL
ALBUMIN: 3.1 g/dL — AB (ref 3.5–5.0)
ALK PHOS: 80 U/L (ref 38–126)
ALT: 23 U/L (ref 0–55)
ALT: 23 U/L (ref 17–63)
ANION GAP: 9 meq/L (ref 3–11)
AST: 18 U/L (ref 5–34)
AST: 18 U/L (ref 15–41)
Albumin: 3 g/dL — ABNORMAL LOW (ref 3.5–5.0)
Alkaline Phosphatase: 87 U/L (ref 40–150)
Anion gap: 4 — ABNORMAL LOW (ref 5–15)
BILIRUBIN TOTAL: 0.3 mg/dL (ref 0.20–1.20)
BILIRUBIN TOTAL: 0.5 mg/dL (ref 0.3–1.2)
BUN: 5.3 mg/dL — ABNORMAL LOW (ref 7.0–26.0)
BUN: 6 mg/dL (ref 6–20)
CALCIUM: 9.5 mg/dL (ref 8.4–10.4)
CALCIUM: 8.7 mg/dL — AB (ref 8.9–10.3)
CHLORIDE: 104 meq/L (ref 98–109)
CO2: 27 mEq/L (ref 22–29)
CO2: 29 mmol/L (ref 22–32)
CREATININE: 1.1 mg/dL (ref 0.7–1.3)
CREATININE: 1.02 mg/dL (ref 0.61–1.24)
Chloride: 105 mmol/L (ref 101–111)
EGFR: 85 mL/min/{1.73_m2} — ABNORMAL LOW (ref >90)
GFR calc Af Amer: >60 mL/min (ref >60)
GFR calc non Af Amer: >60 mL/min (ref >60)
GLUCOSE: 101 mg/dL — AB (ref 65–99)
Glucose: 104 mg/dl (ref 70–140)
POTASSIUM: 3.4 mmol/L — AB (ref 3.5–5.1)
Potassium: 3.4 mEq/L — ABNORMAL LOW (ref 3.5–5.1)
Sodium: 140 mEq/L (ref 136–145)
Sodium: 138 mmol/L (ref 135–145)
Total Protein: 7.2 g/dL (ref 6.4–8.3)
Total Protein: 7 g/dL (ref 6.5–8.1)

## 2017-07-30 LAB — PROTIME-INR
INR: 1.11
Prothrombin Time: 14.2 seconds (ref 11.4–15.2)

## 2017-07-30 LAB — I-STAT TROPONIN, ED: Troponin i, poc: 0 ng/mL (ref 0.00–0.08)

## 2017-07-30 LAB — MAGNESIUM: MAGNESIUM: 1.6 mg/dL — AB (ref 1.7–2.4)

## 2017-07-30 LAB — APTT: aPTT: 33 seconds (ref 24–36)

## 2017-07-30 MED ORDER — IOPAMIDOL (ISOVUE-300) INJECTION 61%
INTRAVENOUS | Status: DC
Start: 2017-07-30 — End: 2017-07-31
  Filled 2017-07-30: qty 75

## 2017-07-30 MED ORDER — HEPARIN BOLUS VIA INFUSION
4000.0000 [IU] | Freq: Once | INTRAVENOUS | Status: AC
  Administered 2017-07-30: 4000 [IU] via INTRAVENOUS

## 2017-07-30 MED ORDER — POTASSIUM CHLORIDE CRYS ER 20 MEQ PO TBCR
40.0000 meq | EXTENDED_RELEASE_TABLET | Freq: Once | ORAL | Status: AC
  Administered 2017-07-30: 40 meq via ORAL
  Filled 2017-07-30: qty 2

## 2017-07-30 MED ORDER — POTASSIUM CHLORIDE IN NACL 40-0.9 MEQ/L-% IV SOLN
INTRAVENOUS | Status: DC
  Administered 2017-07-30: 50 mL/h via INTRAVENOUS

## 2017-07-30 MED ORDER — PANTOPRAZOLE SODIUM 40 MG PO TBEC
40.0000 mg | DELAYED_RELEASE_TABLET | Freq: Every day | ORAL | Status: DC
Start: 2017-07-30 — End: 2017-08-01
  Administered 2017-07-30 – 2017-08-01 (×3): 40 mg via ORAL
  Filled 2017-07-30 (×3): qty 1

## 2017-07-30 MED ORDER — HEPARIN (PORCINE) IN NACL 100-0.45 UNIT/ML-% IJ SOLN
1500.0000 [IU]/h | INTRAMUSCULAR | Status: DC
  Administered 2017-07-30 – 2017-07-31 (×2): 1500 [IU]/h via INTRAVENOUS
  Filled 2017-07-30 (×2): qty 250

## 2017-07-30 MED ORDER — IOPAMIDOL (ISOVUE-300) INJECTION 61%
75.0000 mL | Freq: Once | INTRAVENOUS | Status: AC | PRN
  Administered 2017-07-30: 75 mL via INTRAVENOUS

## 2017-07-30 MED ORDER — MAGNESIUM SULFATE 2 GM/50ML IV SOLN
2.0000 g | Freq: Once | INTRAVENOUS | Status: AC
  Administered 2017-07-31: 2 g via INTRAVENOUS
  Filled 2017-07-30: qty 50

## 2017-07-30 MED ORDER — INFLUENZA VAC SPLIT QUAD 0.5 ML IM SUSY
0.5000 mL | PREFILLED_SYRINGE | INTRAMUSCULAR | Status: AC
  Administered 2017-07-31: 0.5 mL via INTRAMUSCULAR
  Filled 2017-07-30: qty 0.5

## 2017-07-30 MED ORDER — PROCHLORPERAZINE MALEATE 5 MG PO TABS: 10.0000 mg | ORAL_TABLET | Freq: Four times a day (QID) | ORAL | Status: DC | PRN

## 2017-07-30 NOTE — Progress Notes (Signed)
ANTICOAGULATION CONSULT NOTE - Initial Consult  Pharmacy Consult for Heparin Indication: pulmonary embolus (saddle)  Allergies  Allergen Reactions  . Bee Venom Anaphylaxis  . Penicillins Itching and Swelling    Pt has tolerated cephalosporins in the past Has patient had a PCN reaction causing immediate rash, facial/tongue/throat swelling, SOB or lightheadedness with hypotension: unknown Has patient had a PCN reaction causing severe rash involving mucus membranes or skin necrosis: unknown Has patient had a PCN reaction that required hospitalization: unknown Has patient had a PCN reaction occurring within the last 10 years: unknown If all of the above answers are "NO", then may proceed with Cephalosporin [place]    Patient Measurements: Height: 5\' 7"  (170.2 cm) Weight: 198 lb (89.8 kg) IBW/kg (Calculated) : 66.1 HEPARIN DW (KG): 84.8   Vital Signs: Temp: 97.5 F (36.4 C) (10/01 1707) Temp Source: Oral (10/01 1707) BP: 109/83 (10/01 1707) Pulse Rate: 102 (10/01 1707)  Labs:  Recent Labs  07/30/17 0938 07/30/17 0938  HGB 12.4*  --   HCT 37.9*  --   PLT 129*  --   CREATININE  --  1.1    Estimated Creatinine Clearance: 75.4 mL/min (by C-G formula based on SCr of 1.1 mg/dL).   Medical History: Past Medical History:  Diagnosis Date  . Adenocarcinoma of left lung, stage 3 ([place]) 05/10/2017  . Asthma   . Chronic diastolic CHF (congestive heart failure) ([place])   . Lung mass   . Non-small cell lung cancer ([place]) 11/13/2015  . Obesity   . Perforated bowel ([place])     Medications:   (Not in a hospital admission)  Home med list reviewed, med rec this admission pending.  No anticoagulants listed.  Assessment: Okay for Protocol, PT/INR wnl in early September 2018.  No bleeding noted.  Baseline coag labs this admission pending.  PLTC 129 K.  Goal of Therapy:  Heparin level 0.3-0.7 units/ml Monitor platelets by anticoagulation protocol: Yes   Plan:  Give 4000 units bolus x  1 Start heparin infusion at 1500 units/hr Check anti-Xa level in 6-8 hours and daily while on heparin Continue to monitor H&H and platelets  Biagio Quint R 07/30/2017,5:41 PM

## 2017-07-30 NOTE — Telephone Encounter (Signed)
CT results received from MD, paged CODE PE team per the impression note on CT Scan.  Per Dr. Julien Nordmann, called pt, spoke with Silvestre Gunner  and instructed pt to go to ED. [name] states she will take him right now to [facility] ED.  Called [place] s/w Lovey Newcomer advised pt is coming to ED for PE,

## 2017-07-30 NOTE — ED Triage Notes (Signed)
Had labs/ct today at [facility] long. Pt currently having radiation and chemo tx's for lung cancer. Dr. Hulen Skains him back today to advise pt had pulmonary embolism and to get to ED. Pt a/o. Denies pain or increased sob.

## 2017-07-30 NOTE — H&P (Signed)
History and Physical    James Zamora QIH:[id number] DOB: [birth year] DOA: 07/30/2017  PCP: [name], MD   Patient coming from: Home.  I have personally briefly reviewed patient's old medical records in [place]  Chief Complaint: Abnormal chest CT.  HPI: James Zamora is a 61 y.o. male with medical history significant of adenocarcinoma of the left lung on home oxygen at 2-3 LPM, asthma, chronic diastolic CHF, obesity, perforated bowel who is coming to the emergency department after a staging CT scan of his chest surprisingly revealed a submassive intermediate risk PE. He denies chest pain, cough, hemoptysis, wheezing, dyspnea, palpitations, diaphoresis, pitting edema of the lower extremities, PND or orthopnea. However, he complains of occasional dizziness. He denies abdominal pain, nausea, emesis, diarrhea, constipation, melena or hematochezia. He denies dysuria, frequency or hematuria. Her vision, polyuria or polydipsia.  ED Course: Initial vital signs were temperature 97.5, pulse 102, respirations 20, blood pressure 109/83 mmHg and O2 sat 93% on nasal cannula oxygen. He was started on a heparin infusion. Dr. Tanna Furry spoke to Dr. [place] Bells who did not recommend further treatment based on PESI score which is 101.  His workup showed WBC of 3.6, hemoglobin 11.5 g/dL and platelet 128. normal coagulation parameters. His CMP showed a potassium of 3.4 mmol/L. Glucose 101 and calcium 8.7 mg/dL. His albumin was 3.1 g/dL. The rest of the values are within normal limits. Magnesium level was 1.6 mg/dL. Troponin level was negative. His EKG is not available in the system yet, but was reported without changes by Dr. Jeneen Rinks.  Imaging: CT chest with contrast shows a large saddle embolus extending into the pulmonary arterial tree of both lungs, with evidence of right-sided heart strain (RV/LV Ratio = 1.34) among other findings. Please see images and full radiology report for further  detail.  Review of Systems: As per HPI otherwise 10 point review of systems negative.    Past Medical History:  Diagnosis Date  . Adenocarcinoma of left lung, stage 3 ([place]) 05/10/2017  . Asthma   . Chronic diastolic CHF (congestive heart failure) ([place])   . Lung mass   . Non-small cell lung cancer ([place]) 11/13/2015  . Obesity   . Perforated bowel [facility])     Past Surgical History:  Procedure Laterality Date  . CHOLECYSTECTOMY    . COLONOSCOPY N/A 07/31/2016   Procedure: COLONOSCOPY;  Surgeon: Danie Binder, MD;  Location: [facility];  Service: Endoscopy;  Laterality: N/A;  1:45 pm  . ESOPHAGOGASTRODUODENOSCOPY N/A 07/31/2016   Procedure: ESOPHAGOGASTRODUODENOSCOPY (EGD);  Surgeon: Danie Binder, MD;  Location: [facility] ENDO SU[facility];  Service: Endoscopy;  Laterality: N/A;  . EUS N/A 06/07/2017   Procedure: UPPER ENDOSCOPIC ULTRASOUND (EUS) LINEAR;  Surgeon: Milus Banister, MD;  Location: [facility] ENDOSCOPY;  Service: Endoscopy;  Laterality: N/A;  . HERNIA REPAIR    . IR FLUORO GUIDE PORT INSERTION RIGHT  07/11/2017  . IR US GUIDE VASC ACCESS RIGHT  07/11/2017     reports that he quit smoking about 5 years ago. His smoking use included Cigarettes. He has a 20.00 pack-year smoking history. He quit smokeless tobacco use about 3 years ago. His smokeless tobacco use included Chew. He reports that he does not drink alcohol or use drugs.  Allergies  Allergen Reactions  . Bee Venom Anaphylaxis  . Penicillins Itching and Swelling    Pt has tolerated cephalosporins in the past Has patient had a PCN reaction causing immediate rash, facial/tongue/throat swelling, SOB or lightheadedness  with hypotension: unknown Has patient had a PCN reaction causing severe rash involving mucus membranes or skin necrosis: unknown Has patient had a PCN reaction that required hospitalization: unknown Has patient had a PCN reaction occurring within the last 10 years: unknown If all of the above answers are "NO", then may  proceed with Cephalosporin [place]    Family History  Problem Relation Age of Onset  . Diabetes Mother   . Hypertension Mother   . Asthma Father   . Prostate cancer Father   . Hypertension Father   . Colon cancer Neg Hx     Prior to Admission medications   Medication Sig Start Date End Date Taking? Authorizing Provider  aspirin EC 81 MG EC tablet Take 1 tablet (81 mg total) by mouth daily. 06/12/16  Yes Kathie Dike, MD  magic mouthwash w/lidocaine SOLN Take 10 mLs by mouth 4 (four) times daily as needed (pain with swallowing). 07/12/17  Yes Bruning, Ashlyn, PA-C  OXYGEN Inhale 3 L into the lungs continuous.   Yes [provider]  potassium chloride SA (KLOR-CON M20) 20 MEQ tablet Take 1 tablet (20 mEq total) by mouth daily. 04/13/17 07/30/17 Yes Lendon Colonel, NP  prochlorperazine (COMPAZINE) 10 MG tablet Take 1 tablet (10 mg total) by mouth every 6 (six) hours as needed for nausea or vomiting. 05/21/17  Yes Curt Bears, MD  sucralfate (CARAFATE) 1 g tablet Take 1 tablet (1 g total) by mouth 4 (four) times daily -  with meals and at bedtime. 5 min before meals for radiation induced esophagitis 07/06/17  Yes Tyler Pita, MD  Wound Cleansers (RADIAPLEX EX) Apply 1 application topically at bedtime.   Yes [provider]  fluticasone furoate-vilanterol (BREO ELLIPTA) 200-25 MCG/INH AEPB Inhale 1 puff into the lungs daily. Patient not taking: Reported on 07/30/2017 03/30/17   Parrett, Fonnie Mu, NP  furosemide (LASIX) 40 MG tablet Take 3 tablets (120 mg total) by mouth daily. Patient not taking: Reported on 07/30/2017 04/13/17   Lendon Colonel, NP    Physical Exam: Vitals:   07/30/17 1806 07/30/17 2030 07/30/17 2100 07/30/17 2130  BP: 103/74 115/81 105/72 106/66  Pulse: 88 70 86 95  Resp: (!) 22 18 (!) 22 20  Temp:      TempSrc:      SpO2: 100% 100% 100% 98%  Weight:      Height:        Constitutional: NAD, calm, comfortable Eyes: PERRL, lids and  conjunctivae normal ENMT: Mucous membranes are moist. Posterior pharynx clear of any exudate or lesions. Neck: normal, supple, no masses, no thyromegaly Respiratory: clear to auscultation bilaterally, no wheezing, no crackles. Normal respiratory effort. No accessory muscle use.  Cardiovascular: Regular rate and rhythm, no murmurs / rubs / gallops. No extremity edema. 2+ pedal pulses. No carotid bruits.  Abdomen: no tenderness, no masses palpated. No hepatosplenomegaly. Bowel sounds positive.  Musculoskeletal: no clubbing / cyanosis. Good ROM, no contractures. Normal muscle tone.  Skin: no rashes, lesions, ulcers on limited skin exam. Neurologic: CN 2-12 grossly intact. Sensation intact, DTR normal. Strength 5/5 in all 4.  Psychiatric: Normal judgment and insight. Alert and oriented x 4. Normal mood.    Labs on Admission: I have personally reviewed following labs and imaging studies  CBC:  Recent Labs Lab 07/30/17 0938  WBC 4.4  NEUTROABS 2.9  HGB 12.4*  HCT 37.9*  MCV 82.0  PLT 169*   Basic Metabolic Panel:  Recent Labs Lab 07/30/17 0938 07/30/17  2018  NA 140 138  K 3.4* 3.4*  CL  --  105  CO2 27 29  GLUCOSE 104 101*  BUN 5.3* 6  CREATININE 1.1 1.02  CALCIUM 9.5 8.7*   GFR: Estimated Creatinine Clearance: 81.3 mL/min (by C-G formula based on SCr of 1.02 mg/dL). Liver Function Tests:  Recent Labs Lab 07/30/17 0938 07/30/17 2018  AST 18 18  ALT 23 23  ALKPHOS 87 80  BILITOT 0.30 0.5  PROT 7.2 7.0  ALBUMIN 3.0* 3.1*   No results for input(s): LIPASE, AMYLASE in the last 168 hours. No results for input(s): AMMONIA in the last 168 hours. Coagulation Profile:  Recent Labs Lab 07/30/17 1723  INR 1.11   Cardiac Enzymes: No results for input(s): CKTOTAL, CKMB, CKMBINDEX, TROPONINI in the last 168 hours. BNP (last 3 results) No results for input(s): PROBNP in the last 8760 hours. HbA1C: No results for input(s): HGBA1C in the last 72 hours. CBG: No results  for input(s): GLUCAP in the last 168 hours. Lipid Profile: No results for input(s): CHOL, HDL, LDLCALC, TRIG, CHOLHDL, LDLDIRECT in the last 72 hours. Thyroid Function Tests: No results for input(s): TSH, T4TOTAL, FREET4, T3FREE, THYROIDAB in the last 72 hours. Anemia Panel: No results for input(s): VITAMINB12, FOLATE, FERRITIN, TIBC, IRON, RETICCTPCT in the last 72 hours. Urine analysis:    Component Value Date/Time   COLORURINE YELLOW 03/08/2017 1850   APPEARANCEUR CLEAR 03/08/2017 1850   LABSPEC 1.016 03/08/2017 1850   PHURINE 5.0 03/08/2017 1850   GLUCOSEU NEGATIVE 03/08/2017 1850   HGBUR NEGATIVE 03/08/2017 1850   BILIRUBINUR NEGATIVE 03/08/2017 1850   KETONESUR NEGATIVE 03/08/2017 1850   PROTEINUR NEGATIVE 03/08/2017 1850   UROBILINOGEN 0.2 01/16/2010 1955   NITRITE NEGATIVE 03/08/2017 1850   LEUKOCYTESUR NEGATIVE 03/08/2017 1850    Radiological Exams on Admission: Ct Chest W Contrast  Result Date: 07/30/2017 CLINICAL DATA:  61 year old male with history of left-sided lung cancer status post chemotherapy and radiation therapy now complete. On home oxygen. EXAM: CT CHEST WITH CONTRAST TECHNIQUE: Multidetector CT imaging of the chest was performed during intravenous contrast administration. CONTRAST:  11mL ISOVUE-300 IOPAMIDOL (ISOVUE-300) INJECTION 61% COMPARISON:  Chest CT 02/21/2017.  PET-CT 03/09/2017. FINDINGS: Cardiovascular: Large filling defect which extends across the the bifurcation of the pulmonic trunk extending into lobar, segmental and subsegmental sized pulmonary artery is bilaterally, indicative of a saddle embolus. Right ventricle appears dilated (4.7 cm) as compared with the (3.5 cm), within RV to LV ratio of 1.34. There is no significant pericardial fluid, thickening or pericardial calcification. Aortic atherosclerosis. No definite coronary artery calcifications are identified. Right internal jugular single-lumen porta cath with tip terminating in the right atrium.  Mediastinum/Nodes: Mildly enlarged low left paratracheal lymph node measuring 11 mm in short axis. No other definite mediastinal or hilar lymphadenopathy. Esophagus is unremarkable in appearance. No axillary lymphadenopathy. Lungs/Pleura: Previously noted left lower lobe mass measures 3.5 x 3.3 x 3.7 cm (axial image 69 of series 7 and sagittal image 108 of series 6). This mass again demonstrates macrolobulated slightly spiculated margins with extends posteriorly to the overlying pleura which is mildly thickened. Slight haziness and septal thickening now surround the lesion, which could be reflection of very early postradiation changes. Subpleural satellite nodule measuring 6 mm in the superior segment of the left lower lobe abutting the major fissure (axial image 66 of series 7) is noted, but nonspecific. No other definite suspicious appearing pulmonary nodules or masses are noted. No acute consolidative airspace disease. No pleural  effusions. Mild diffuse bronchial wall thickening with mild centrilobular and paraseptal emphysema. Upper Abdomen: Mild soft tissue stranding adjacent to the pancreas is new compared to the prior examination, and nonspecific, but can be seen in the setting of an acute pancreatitis. Notably, of the head of the pancreas is incompletely visualized (previously there was a lesion noted in the pancreatic head). Status post cholecystectomy. Chronic nodular thickening of the left adrenal gland with some faint calcifications, similar to prior examinations dating back to 2011, presumably benign. Musculoskeletal: Chronic irregular lucent lesion in the right side of the manubrium, similar to prior studies dating back to 2011, presumably benign. There are no aggressive appearing lytic or blastic lesions noted in the visualized portions of the skeleton. IMPRESSION: 1. Large saddle embolus extending into the pulmonary arterial tree of both lungs, with evidence of right-sided heart strain (RV/LV Ratio =  1.34) consistent with at least submassive (intermediate risk) PE. The presence of right heart strain has been associated with an increased risk of morbidity and mortality. Please activate Code PE by paging [phone]. Critical Value/emergent results were called by telephone at the time of interpretation on 07/30/2017 at 3:32 pm to Dr. Curt Bears, who verbally acknowledged these results. 2. 3.5 x 3.3 x 3.7 cm left lower lobe mass appears very similar to prior examinations. Mildly enlarged low left paratracheal lymph node is suspicious for a metastatic lesion. 3. New subtle inflammatory changes adjacent to the body and tail of the pancreas concerning for an acute pancreatitis. Clinical correlation and correlation with lipase levels is recommended. Notably, the pancreatic head was not visualized on today's examination, but a hypermetabolic lesion was noted in the pancreatic head on prior PET-CT. The possibility of ductal obstruction should be considered. 4. Aortic atherosclerosis. 5. Mild diffuse bronchial wall thickening with mild centrilobular and paraseptal emphysema; imaging findings suggestive of underlying COPD. Aortic Atherosclerosis (ICD10-I70.0) and Emphysema (ICD10-J43.9). Electronically Signed   By: Vinnie Langton M.D.   On: 07/30/2017 15:43   Echocardiogram 06/08/2016 ------------------------------------------------------------------- LV EF: 60% -   65%  ------------------------------------------------------------------- Indications:      CHF - 428.0.  ------------------------------------------------------------------- Study Conclusions  - Left ventricle: The cavity size was normal. Wall thickness was   increased in a pattern of moderate LVH. Systolic function was   normal. The estimated ejection fraction was in the range of 60%   to 65%. Wall motion was normal; there were no regional wall   motion abnormalities. Left ventricular diastolic function   parameters were normal. -  Aortic valve: Valve area (VTI): 2.72 cm^2. Valve area (Vmax):   2.72 cm^2. Valve area (Vmean): 2.71 cm^2. - Right ventricle: The cavity size was mildly dilated. - Atrial septum: No defect or patent foramen ovale was identified. - Technically adequate study.   EKG: Independently reviewed Vent. rate 88 BPM PR interval * ms QRS duration 103 ms QT/QTc 383/464 ms P-R-T axes 42 62 35 Sinus rhythm.  Assessment/Plan Principal Problem:   Pulmonary embolism [facility]) Discussed by Dr. Jeneen Rinks with PCCM who recommended admission to our stepdown/ICU. PCCM stated that based on the PESI score, the patient does not need guided or systemic  thrombolytic therapy. Continue heparin infusion per pharmacy. Continue supplemental oxygen. Trend troponin level. Check EKG in the morning. Check echocardiogram in the morning.  Active Problems:   Thrombocytopenia (HCC) Hold aspirin. Will follow up daily while on heparin.    Anemia Anemia panel earlier this year showed low iron and low normal ferritin. Monitor hematocrit and hemoglobin.  Adenocarcinoma of left lung, stage 3 (HCC) The patient is scheduled to see Dr. Earlie Server later this week. Depending on hospital stay, may need to reschedule this visit.    Asthma Supplemental oxygen and bronchodilators as needed.    Hypokalemia Replacing. Check magnesium level and optimize. Follow up potassium level in AM.    Hypomagnesemia  Replacing.     DVT prophylaxis: On heparin infusion. Code Status: Full code. Family Communication: His sister was present in the ED. Disposition Plan: Admit for anticoagulation and further work up. Consults called:  Admission status: Inpatient/SDU.   Reubin Milan MD [facility] Pager [phone].  If 7PM-7AM, please contact night-coverage [email or web address] Password [email or web address]  07/30/2017, 9:51 PM

## 2017-07-30 NOTE — ED Notes (Signed)
MD at bedside. 

## 2017-07-30 NOTE — Telephone Encounter (Signed)
-----   Message from Curt Bears, MD sent at 07/30/2017  3:58 PM EDT -----   ----- Message ----- From: Interface, Rad Results In Sent: 07/30/2017   3:46 PM To: Curt Bears, MD

## 2017-07-30 NOTE — ED Provider Notes (Signed)
[facility] DEPT Provider Note   CSN: [id number] Arrival date & time: 07/30/17  1658     History   Chief Complaint Chief Complaint  Patient presents with  . Shortness of Breath    PE-sent from pcp    HPI James Zamora is a 61 y.o. male.  Chief complaint is "I was sent by my doctor".  HPI:  61 year old male. Diagnosed with right non-small cell lung cancer in May of this year. Received radiotherapy from June to July. Received chemotherapy from June until August. Is continuing chemotherapy starting again after Infuse-a-Port placement last week.  Patient states that he was undergoing a CT scan because of restaging after his completion of his adjunct treatment. CT scan of his chest was a pain today and shows large submassive pulmonary embolus. Dr. Julien Nordmann received a phone call from the radiologist and the patient was directed to the nearest emergency room.  He denies shortness of breath. He denies chest pain. Twice in last 2 days he has felt her syncope with exertion. He is chronically on 3 L nasal cannula O2 and well oxygenated 94% on that today. No cough. No hemoptysis.  Past Medical History:  Diagnosis Date  . Adenocarcinoma of left lung, stage 3 ([place]) 05/10/2017  . Asthma   . Chronic diastolic CHF (congestive heart failure) ([place])   . Lung mass   . Non-small cell lung cancer ([place]) 11/13/2015  . Obesity   . Perforated bowel [facility])     Patient Active Problem List   Diagnosis Date Noted  . Pancreatic mass   . Encounter for antineoplastic chemotherapy 06/04/2017  . Adenocarcinoma of left lung, stage 3 ([place]) 05/10/2017  . COPD (chronic obstructive pulmonary disease) ([place]) 03/29/2017  . Edema of left lower extremity 11/27/2016  . Swelling   . Acute diastolic CHF (congestive heart failure) ([place]) 06/07/2016  . Emesis 06/07/2016  . Generalized abdominal pain 06/07/2016  . Anemia 06/07/2016  . Dehydration 11/13/2015  . AKI (acute kidney injury) ([place]) 11/13/2015  . Wound  infection 11/13/2015  . Non-small cell lung cancer ([place]) - Adenocarcinoma 11/13/2015    Past Surgical History:  Procedure Laterality Date  . CHOLECYSTECTOMY    . COLONOSCOPY N/A 07/31/2016   Procedure: COLONOSCOPY;  Surgeon: Danie Binder, MD;  Location: [facility] ENDO SU[facility];  Service: Endoscopy;  Laterality: N/A;  1:45 pm  . ESOPHAGOGASTRODUODENOSCOPY N/A 07/31/2016   Procedure: ESOPHAGOGASTRODUODENOSCOPY (EGD);  Surgeon: Danie Binder, MD;  Location: [facility] ENDO SUITE;  Service: Endoscopy;  Laterality: N/A;  . EUS N/A 06/07/2017   Procedure: UPPER ENDOSCOPIC ULTRASOUND (EUS) LINEAR;  Surgeon: Milus Banister, MD;  Location: [facility] ENDOSCOPY;  Service: Endoscopy;  Laterality: N/A;  . HERNIA REPAIR    . IR FLUORO GUIDE PORT INSERTION RIGHT  07/11/2017  . IR US GUIDE VASC ACCESS RIGHT  07/11/2017       Home Medications    Prior to Admission medications   Medication Sig Start Date End Date Taking? Authorizing Provider  aspirin EC 81 MG EC tablet Take 1 tablet (81 mg total) by mouth daily. 06/12/16  Yes Kathie Dike, MD  magic mouthwash w/lidocaine SOLN Take 10 mLs by mouth 4 (four) times daily as needed (pain with swallowing). 07/12/17  Yes Bruning, Ashlyn, PA-C  OXYGEN Inhale 3 L into the lungs continuous.   Yes [provider]  potassium chloride SA (KLOR-CON M20) 20 MEQ tablet Take 1 tablet (20 mEq total) by mouth daily. 04/13/17 07/30/17 Yes Lendon Colonel, NP  prochlorperazine (  COMPAZINE) 10 MG tablet Take 1 tablet (10 mg total) by mouth every 6 (six) hours as needed for nausea or vomiting. 05/21/17  Yes Curt Bears, MD  sucralfate (CARAFATE) 1 g tablet Take 1 tablet (1 g total) by mouth 4 (four) times daily -  with meals and at bedtime. 5 min before meals for radiation induced esophagitis 07/06/17  Yes Tyler Pita, MD  Wound Cleansers (RADIAPLEX EX) Apply 1 application topically at bedtime.   Yes [provider]  fluticasone furoate-vilanterol (BREO ELLIPTA) 200-25  MCG/INH AEPB Inhale 1 puff into the lungs daily. Patient not taking: Reported on 07/30/2017 03/30/17   Parrett, Fonnie Mu, NP  furosemide (LASIX) 40 MG tablet Take 3 tablets (120 mg total) by mouth daily. Patient not taking: Reported on 07/30/2017 04/13/17   Lendon Colonel, NP    Family History Family History  Problem Relation Age of Onset  . Diabetes Mother   . Hypertension Mother   . Asthma Father   . Prostate cancer Father   . Hypertension Father   . Colon cancer Neg Hx     Social History Social History  Substance Use Topics  . Smoking status: Former Smoker    Packs/day: 2.00    Years: 10.00    Types: Cigarettes    Quit date: 07/06/2012  . Smokeless tobacco: Former Systems developer    Types: Chew    Quit date: 07/06/2014  . Alcohol use No     Allergies   Bee venom and Penicillins   Review of Systems Review of Systems  Constitutional: Negative for appetite change, chills, diaphoresis, fatigue and fever.  HENT: Negative for mouth sores, sore throat and trouble swallowing.   Eyes: Negative for visual disturbance.  Respiratory: Negative for cough, chest tightness, shortness of breath and wheezing.   Cardiovascular: Negative for chest pain.  Gastrointestinal: Negative for abdominal distention, abdominal pain, diarrhea, nausea and vomiting.  Endocrine: Negative for polydipsia, polyphagia and polyuria.  Genitourinary: Negative for dysuria, frequency and hematuria.  Musculoskeletal: Negative for gait problem.  Skin: Negative for color change, pallor and rash.  Neurological: Positive for dizziness, syncope and light-headedness. Negative for headaches.  Hematological: Does not bruise/bleed easily.  Psychiatric/Behavioral: Negative for behavioral problems and confusion.     Physical Exam Updated Vital Signs BP 115/81   Pulse 70   Temp (!) 97.5 F (36.4 C) (Oral)   Resp 18   Ht 5\' 7"  (1.702 m)   Wt 89.8 kg (198 lb)   SpO2 100%   BMI 31.01 kg/m   Physical Exam  Constitutional:  He is oriented to person, place, and time. He appears well-developed and well-nourished. No distress.  HENT:  Head: Normocephalic.  Eyes: Pupils are equal, round, and reactive to light. Conjunctivae are normal. No scleral icterus.  Neck: Normal range of motion. Neck supple. No thyromegaly present.  Cardiovascular: Normal rate and regular rhythm.  Exam reveals no gallop and no friction rub.   No murmur heard. He is not tachycardic. Stable pressures. 97% on 3 L nasal cannula at rest. Clear lungs.  Pulmonary/Chest: Effort normal and breath sounds normal. No respiratory distress. He has no wheezes. He has no rales.  Abdominal: Soft. Bowel sounds are normal. He exhibits no distension. There is no tenderness. There is no rebound.  Musculoskeletal: Normal range of motion.  Neurological: He is alert and oriented to person, place, and time.  Skin: Skin is warm and dry. No rash noted.  Psychiatric: He has a normal mood and affect. His  behavior is normal.     ED Treatments / Results  Labs (all labs ordered are listed, but only abnormal results are displayed) Labs Reviewed  COMPREHENSIVE METABOLIC PANEL - Abnormal; Notable for the following:       Result Value   Potassium 3.4 (*)    Glucose, Bld 101 (*)    Calcium 8.7 (*)    Albumin 3.1 (*)    Anion gap 4 (*)    All other components within normal limits  PROTIME-INR  APTT  HEPARIN LEVEL (UNFRACTIONATED)  CBC  I-STAT TROPONIN, ED    EKG  EKG Interpretation  Date/Time:  Monday July 30 2017 17:53:25 EDT Ventricular Rate:  88 PR Interval:    QRS Duration: 103 QT Interval:  383 QTC Calculation: 464 R Axis:   62 Text Interpretation:  Sinus rhythm Confirmed by Tanna Furry [phone]) on 07/30/2017 6:14:50 PM       Radiology Ct Chest W Contrast  Result Date: 07/30/2017 CLINICAL DATA:  61 year old male with history of left-sided lung cancer status post chemotherapy and radiation therapy now complete. On home oxygen. EXAM: CT CHEST WITH  CONTRAST TECHNIQUE: Multidetector CT imaging of the chest was performed during intravenous contrast administration. CONTRAST:  74mL ISOVUE-300 IOPAMIDOL (ISOVUE-300) INJECTION 61% COMPARISON:  Chest CT 02/21/2017.  PET-CT 03/09/2017. FINDINGS: Cardiovascular: Large filling defect which extends across the the bifurcation of the pulmonic trunk extending into lobar, segmental and subsegmental sized pulmonary artery is bilaterally, indicative of a saddle embolus. Right ventricle appears dilated (4.7 cm) as compared with the (3.5 cm), within RV to LV ratio of 1.34. There is no significant pericardial fluid, thickening or pericardial calcification. Aortic atherosclerosis. No definite coronary artery calcifications are identified. Right internal jugular single-lumen porta cath with tip terminating in the right atrium. Mediastinum/Nodes: Mildly enlarged low left paratracheal lymph node measuring 11 mm in short axis. No other definite mediastinal or hilar lymphadenopathy. Esophagus is unremarkable in appearance. No axillary lymphadenopathy. Lungs/Pleura: Previously noted left lower lobe mass measures 3.5 x 3.3 x 3.7 cm (axial image 69 of series 7 and sagittal image 108 of series 6). This mass again demonstrates macrolobulated slightly spiculated margins with extends posteriorly to the overlying pleura which is mildly thickened. Slight haziness and septal thickening now surround the lesion, which could be reflection of very early postradiation changes. Subpleural satellite nodule measuring 6 mm in the superior segment of the left lower lobe abutting the major fissure (axial image 66 of series 7) is noted, but nonspecific. No other definite suspicious appearing pulmonary nodules or masses are noted. No acute consolidative airspace disease. No pleural effusions. Mild diffuse bronchial wall thickening with mild centrilobular and paraseptal emphysema. Upper Abdomen: Mild soft tissue stranding adjacent to the pancreas is new  compared to the prior examination, and nonspecific, but can be seen in the setting of an acute pancreatitis. Notably, of the head of the pancreas is incompletely visualized (previously there was a lesion noted in the pancreatic head). Status post cholecystectomy. Chronic nodular thickening of the left adrenal gland with some faint calcifications, similar to prior examinations dating back to 2011, presumably benign. Musculoskeletal: Chronic irregular lucent lesion in the right side of the manubrium, similar to prior studies dating back to 2011, presumably benign. There are no aggressive appearing lytic or blastic lesions noted in the visualized portions of the skeleton. IMPRESSION: 1. Large saddle embolus extending into the pulmonary arterial tree of both lungs, with evidence of right-sided heart strain (RV/LV Ratio = 1.34) consistent with at  least submassive (intermediate risk) PE. The presence of right heart strain has been associated with an increased risk of morbidity and mortality. Please activate Code PE by paging [phone]. Critical Value/emergent results were called by telephone at the time of interpretation on 07/30/2017 at 3:32 pm to Dr. Curt Bears, who verbally acknowledged these results. 2. 3.5 x 3.3 x 3.7 cm left lower lobe mass appears very similar to prior examinations. Mildly enlarged low left paratracheal lymph node is suspicious for a metastatic lesion. 3. New subtle inflammatory changes adjacent to the body and tail of the pancreas concerning for an acute pancreatitis. Clinical correlation and correlation with lipase levels is recommended. Notably, the pancreatic head was not visualized on today's examination, but a hypermetabolic lesion was noted in the pancreatic head on prior PET-CT. The possibility of ductal obstruction should be considered. 4. Aortic atherosclerosis. 5. Mild diffuse bronchial wall thickening with mild centrilobular and paraseptal emphysema; imaging findings suggestive  of underlying COPD. Aortic Atherosclerosis (ICD10-I70.0) and Emphysema (ICD10-J43.9). Electronically Signed   By: Vinnie Langton M.D.   On: 07/30/2017 15:43    Procedures Procedures (including critical care time)  Medications Ordered in ED Medications  heparin bolus via infusion 4,000 Units (4,000 Units Intravenous Bolus from Bag 07/30/17 1810)    Followed by  heparin ADULT infusion 100 units/mL (25000 units/213mL sodium chloride 0.45%) (1,500 Units/hr Intravenous New Bag/Given 07/30/17 1810)     Initial Impression / Assessment and Plan / ED Course  I have reviewed the triage vital signs and the nursing notes.  Pertinent labs & imaging results that were available during my care of the patient were reviewed by me and considered in my medical decision making (see chart for details).   CT scan today:   IMPRESSION: 1. Large saddle embolus extending into the pulmonary arterial tree of both lungs, with evidence of right-sided heart strain (RV/LV Ratio = 1.34) consistent with at least submassive (intermediate risk) PE. The presence of right heart strain has been associated with an increased risk of morbidity and mortality. Please activate Code PE by paging [phone]. Critical Value/emergent results were called by telephone at the time of interpretation on 07/30/2017 at 3:32 pm to Dr. Curt Bears, who verbally acknowledged these Results.  EKG obtained does not show right heart strain. Does not show right axis. Is not tachycardic. No ischemia. Troponin normal. Was given heparin bolus and infusion upon my initial orders after seeing the patient in reading his CT scan.  The size and description of this embolus were discussed with intensivist for their opinion on possibility of need for lytic therapy  20:55: discussed with Dr. Lake Bells of pulmonary. Per my description of the patient and our discussion a patient he felt that based on the patient's low PESI score that the patient would  not require lytics, either guided or systemic. He recommends AM ECHO and PCCM consult if abnormal. .  Patient discussed with Dr. Olevia Bowens of hospitalist. Patient be admitted here. Is on heparin bolus and drip per my order.  CRITICAL CARE Performed by: Tanna Furry JOSEPH   Total critical care time: 30 minutes  Critical care time was exclusive of separately billable procedures and treating other patients.  Critical care was necessary to treat or prevent imminent or life-threatening deterioration.  Critical care was time spent personally by me on the following activities: development of treatment plan with patient and/or surrogate as well as nursing, discussions with consultants, evaluation of patient's response to treatment, examination of patient, obtaining history from  patient or surrogate, ordering and performing treatments and interventions, ordering and review of laboratory studies, ordering and review of radiographic studies, pulse oximetry and re-evaluation of patient's condition.    Final Clinical Impressions(s) / ED Diagnoses   Final diagnoses:  Acute saddle pulmonary embolism without acute cor pulmonale (HCC)    New Prescriptions New Prescriptions   No medications on file     Tanna Furry, MD 07/30/17 2058

## 2017-07-31 ENCOUNTER — Inpatient Hospital Stay (HOSPITAL_COMMUNITY): Payer: Medicare Other

## 2017-07-31 ENCOUNTER — Telehealth: Payer: Self-pay

## 2017-07-31 ENCOUNTER — Encounter (HOSPITAL_COMMUNITY): Payer: Self-pay | Admitting: Family Medicine

## 2017-07-31 DIAGNOSIS — C3432 Malignant neoplasm of lower lobe, left bronchus or lung: Secondary | ICD-10-CM

## 2017-07-31 DIAGNOSIS — D696 Thrombocytopenia, unspecified: Secondary | ICD-10-CM | POA: Diagnosis present

## 2017-07-31 DIAGNOSIS — I700 Atherosclerosis of aorta: Secondary | ICD-10-CM

## 2017-07-31 HISTORY — DX: Atherosclerosis of aorta: I70.0

## 2017-07-31 LAB — TROPONIN I: Troponin I: <0.03 ng/mL (ref <0.03)

## 2017-07-31 LAB — CBC
HCT: 35.7 % — ABNORMAL LOW (ref 39.0–52.0)
HEMOGLOBIN: 11.5 g/dL — AB (ref 13.0–17.0)
MCH: 26.3 pg (ref 26.0–34.0)
MCHC: 32.2 g/dL (ref 30.0–36.0)
MCV: 81.5 fL (ref 78.0–100.0)
PLATELETS: 128 10*3/uL — AB (ref 150–400)
RBC: 4.38 MIL/uL (ref 4.22–5.81)
RDW: 16.2 % — ABNORMAL HIGH (ref 11.5–15.5)
WBC: 3.6 10*3/uL — AB (ref 4.0–10.5)

## 2017-07-31 LAB — HEPARIN LEVEL (UNFRACTIONATED)
HEPARIN UNFRACTIONATED: 1.48 [IU]/mL — AB (ref 0.30–0.70)
Heparin Unfractionated: 0.34 IU/mL (ref 0.30–0.70)
Heparin Unfractionated: 1.68 IU/mL — ABNORMAL HIGH (ref 0.30–0.70)

## 2017-07-31 LAB — BASIC METABOLIC PANEL
ANION GAP: 6 (ref 5–15)
BUN: 6 mg/dL (ref 6–20)
CALCIUM: 8.7 mg/dL — AB (ref 8.9–10.3)
CO2: 30 mmol/L (ref 22–32)
Chloride: 104 mmol/L (ref 101–111)
Creatinine, Ser: 0.97 mg/dL (ref 0.61–1.24)
GLUCOSE: 121 mg/dL — AB (ref 65–99)
POTASSIUM: 3.9 mmol/L (ref 3.5–5.1)
Sodium: 140 mmol/L (ref 135–145)

## 2017-07-31 LAB — MRSA PCR SCREENING: MRSA BY PCR: NEGATIVE

## 2017-07-31 MED ORDER — HEPARIN (PORCINE) IN NACL 100-0.45 UNIT/ML-% IJ SOLN
1050.0000 [IU]/h | INTRAMUSCULAR | Status: DC
  Administered 2017-07-31: 1050 [IU]/h via INTRAVENOUS
  Filled 2017-07-31: qty 250

## 2017-07-31 MED ORDER — HEPARIN (PORCINE) IN NACL 100-0.45 UNIT/ML-% IJ SOLN: 1300.0000 [IU]/h | INTRAMUSCULAR | Status: DC

## 2017-07-31 MED ORDER — BISACODYL 10 MG RE SUPP
10.0000 mg | Freq: Once | RECTAL | Status: AC
  Administered 2017-07-31: 10 mg via RECTAL
  Filled 2017-07-31: qty 1

## 2017-07-31 NOTE — Progress Notes (Addendum)
ANTICOAGULATION CONSULT NOTE - follow up  Pharmacy Consult for Heparin Indication: pulmonary embolus (saddle)  Allergies  Allergen Reactions  . Bee Venom Anaphylaxis  . Penicillins Itching and Swelling    Pt has tolerated cephalosporins in the past Has patient had a PCN reaction causing immediate rash, facial/tongue/throat swelling, SOB or lightheadedness with hypotension: unknown Has patient had a PCN reaction causing severe rash involving mucus membranes or skin necrosis: unknown Has patient had a PCN reaction that required hospitalization: unknown Has patient had a PCN reaction occurring within the last 10 years: unknown If all of the above answers are "NO", then may proceed with Cephalosporin [place]    Patient Measurements: Height: 5\' 7"  (170.2 cm) Weight: 198 lb (89.8 kg) IBW/kg (Calculated) : 66.1 HEPARIN DW (KG): 84.8   Vital Signs: Temp: 98.4 F (36.9 C) (10/02 1548) Temp Source: Oral (10/02 1548) BP: 123/80 (10/02 1800) Pulse Rate: 91 (10/02 1800)  Labs:  Recent Labs  07/30/17 0938 07/30/17 0938 07/30/17 1723 07/30/17 1800 07/30/17 2018 07/31/17 0125 07/31/17 0630 07/31/17 0917 07/31/17 1930  HGB 12.4*  --   --   --   --  11.5*  --   --   --   HCT 37.9*  --   --   --   --  35.7*  --   --   --   PLT 129*  --   --   --   --  128*  --   --   --   APTT  --   --   --  33  --   --   --   --   --   LABPROT  --   --  14.2  --   --   --   --   --   --   INR  --   --  1.11  --   --   --   --   --   --   HEPARINUNFRC  --   --   --   --   --  0.34  --  1.68* 1.48*  CREATININE  --  1.1  --   --  1.02 0.97  --   --   --   TROPONINI  --   --   --   --   --  <0.03 <0.03  --   --     Estimated Creatinine Clearance: 85.5 mL/min (by C-G formula based on SCr of 0.97 mg/dL).   Medical History: Past Medical History:  Diagnosis Date  . Adenocarcinoma of left lung, stage 3 ([place]) 05/10/2017  . Aortic atherosclerosis ([place]) 07/31/2017  . Asthma   . Chronic diastolic CHF  (congestive heart failure) ([place])   . Lung mass   . Non-small cell lung cancer ([place]) 11/13/2015  . Obesity   . Perforated bowel ([place])     Medications:  Prescriptions Prior to Admission  Medication Sig Dispense Refill Last Dose  . aspirin EC 81 MG EC tablet Take 1 tablet (81 mg total) by mouth daily. 30 tablet 1 07/29/2017 at Unknown time  . magic mouthwash w/lidocaine SOLN Take 10 mLs by mouth 4 (four) times daily as needed (pain with swallowing). 960 mL 0 Past Week at Unknown time  . OXYGEN Inhale 3 L into the lungs continuous.   07/30/2017 at Unknown time  . potassium chloride SA (KLOR-CON M20) 20 MEQ tablet Take 1 tablet (20 mEq total) by mouth daily. 30 tablet 6 07/29/2017 at Unknown  time  . prochlorperazine (COMPAZINE) 10 MG tablet Take 1 tablet (10 mg total) by mouth every 6 (six) hours as needed for nausea or vomiting. 30 tablet 0 unknown  . sucralfate (CARAFATE) 1 g tablet Take 1 tablet (1 g total) by mouth 4 (four) times daily -  with meals and at bedtime. 5 min before meals for radiation induced esophagitis 120 tablet 2 07/29/2017 at Unknown time  . Wound Cleansers (RADIAPLEX EX) Apply 1 application topically at bedtime.   07/29/2017 at Unknown time  . fluticasone furoate-vilanterol (BREO ELLIPTA) 200-25 MCG/INH AEPB Inhale 1 puff into the lungs daily. (Patient not taking: Reported on 07/30/2017) 1 each 5 Not Taking at Unknown time  . furosemide (LASIX) 40 MG tablet Take 3 tablets (120 mg total) by mouth daily. (Patient not taking: Reported on 07/30/2017) 90 tablet 6 Not Taking at Unknown time    Home med list reviewed, med rec this admission pending.  No anticoagulants listed.  Assessment: Okay for Protocol, PT/INR wnl in early September 2018.  No bleeding noted.  Baseline coag labs this admission pending.  PLTC 129 K. HL this AM was therapeutic at 0.32; the f/u HL this AM at 0917 was supratherapeutic and remains supratherapeutic at 1.48.   Goal of Therapy:  Heparin level 0.3-0.7  units/ml Monitor platelets by anticoagulation protocol: Yes   Plan:  Hold heparin for 1 hour, then decrease heparin infusion to 1050 units/hr Check anti-Xa level in 6-8 hours and daily while on heparin Continue to monitor H&H and platelets  Isac Sarna, BS Vena [place], BCPS Clinical Pharmacist Pager [phone] 07/31/2017,8:59 PM

## 2017-07-31 NOTE — Telephone Encounter (Signed)
Will see him that day if he is discharged tomorrow, otherwise reschedule for next week.

## 2017-07-31 NOTE — Care Management Note (Signed)
Case Management Note  Patient Details  Name: James Zamora MRN: [id number] Date of Birth: 1955-12-07  Subjective/Objective: Adm with PE. History of lung cancer. From home with family, ind with ADL's. No home health PTA. Has continuous oxygen set up at home provided by [facility]. Has PCP, sister drives him to appointments. Patient has Medicare and Medicaid, has no issues affording medications.                    Action/Plan: Anticipate DC home. CM following for needs. If patient discharges on Lovenox, it is covered by his insurance.    Expected Discharge Date:     08/03/2017            Expected Discharge Plan:  Home/Self Care  In-House Referral:     Discharge planning Services  CM Consult  Post Acute Care Choice:    Choice offered to:     DME Arranged:    DME Agency:     HH Arranged:    HH Agency:     Status of Service:  In process, will continue to follow  If discussed at Long Length of Stay Meetings, dates discussed:    Additional Comments:  Griselle Rufer, Chauncey [place], RN 07/31/2017, 12:16 PM

## 2017-07-31 NOTE — Telephone Encounter (Signed)
Sister called that pt is in [place] hospital with a blood clot in lungs. She is unsure when he will be released. He currently has 10/4 appt with Dr Julien Nordmann . Do we need to r/s or cancel this?

## 2017-07-31 NOTE — Progress Notes (Signed)
EKG completed and placed on chart 

## 2017-07-31 NOTE — Plan of Care (Signed)
Problem: Nutrition: Goal: Adequate nutrition will be maintained Outcome: Completed/Met Date Met: 07/31/17 Pt is maintaining a good appetite and eating 100% of his meals.

## 2017-07-31 NOTE — Progress Notes (Signed)
  PROGRESS NOTE  DONI WIDMER IDP:[id number] DOB: [birth year] DOA: 07/30/2017 PCP: [name], MD  Brief Narrative: 61 year old man PMH stage III non-small cell lung cancer currently undergoing chemotherapy, who had an outpatient CT done to restage which surprisingly showed submassive pulmonary embolism and the patient was therefore sent to the emergency department. Patient was admitted for treatment of the same.  Assessment/Plan Saddle pulmonary embolus, submassive by criteria with evidence of right heart strain on CT. -Relatively asymptomatic. Respiratory status reported at baseline. Oxygen requirement at baseline. Hemodynamics stable. -Continue IV heparin today, follow-up echocardiogram -Message sent to Dr. Earlie Server to discuss referred outpatient treatment regimen  Stage III non-small cell lung cancer currently undergoing chemotherapy -Follow-up with Dr. Earlie Server as an outpatient  Chronic hypoxic respiratory failure -Stable, secondary to lung cancer, COPD  Thrombocytopenia secondary to chemotherapy -Follow clinically, check CBC in AM  Abnormal appearance of the pancreas with known pancreatic head mass -Asymptomatic, no further evaluation at this time.  Aortic atherosclerosis -Asymptomatic, no further evaluation   DVT prophylaxis: HEPARIN infusion Code Status: full Family Communication: none Disposition Plan: home    Murray Hodgkins, MD  [facility] Direct contact: [phone] --[address] [place]  --[email or web address]; password [email or web address]  7PM-7AM contact night coverage as above 07/31/2017, 10:33 AM  LOS: 1 day   Consultants:    Procedures:    Antimicrobials:    Interval history/Subjective: Feels better. Breathing at baseline. No pain. No complaints.  Objective: Vitals: 97.7, 21, 88, 135/94  Exam:     Constitutional: Appears calm, comfortable, mildly short of breath  Eyes. Pupils, irises, lids appear unremarkable.  ENT. Grossly normal hearing,  lips, tongue  Respiratory. Fair air movement. No rales or rhonchi. Mild wheeze. Mild increased respiratory effort. Speaks in full sentences.  Cardiovascular. Regular rate and rhythm. No murmur, rub or gallop. 1+ bilateral lower extremity edema. Telemetry sinus tachycardia.  Psychiatric. Grossly normal mood and affect. Speech fluent and appropriate.   I have personally reviewed the following:   Labs:  Basic metabolic panel unremarkable  Troponins negative  Platelets stable, 128. Hemoglobin stable 11.5.  Imaging studies: CT chest large saddle embolus consistent with at least 7 massive PE with evidence of right heart strain  Left lower lobe mass appears similar to previous per radiology  Subtle inflammatory changes adjacent to the pancreas could represent pancreatitis. However hypermetabolic lesion was noted in the pancreatic head on previous PET-CT  Aortic atherosclerosis  Medical tests:  Independent interpretation. EKG sinus rhythm, no acute changes.  Review and summation of old records:  Oncology note 07/03/2017: Follow-up visit for stage III non-small cell lung cancer. Patient reported to be tolerating treatment well with plans for seventh cycle of therapy 9/7. Follow-up CT scan to restage was planned for October.  Scheduled Meds: . Influenza vac split quadrivalent PF  0.5 mL Intramuscular Tomorrow-1000  . pantoprazole  40 mg Oral Daily   Continuous Infusions: . 0.9 % NaCl with KCl 40 mEq / L 50 mL/hr at 07/31/17 0600  . heparin 1,500 Units/hr (07/31/17 0710)    Principal Problem:   Pulmonary embolism (HCC) Active Problems:   Adenocarcinoma of left lung, stage 3 (HCC)   Asthma   Thrombocytopenia (HCC)   Aortic atherosclerosis (HCC)   LOS: 1 day

## 2017-07-31 NOTE — Progress Notes (Signed)
ANTICOAGULATION CONSULT NOTE - Initial Consult  Pharmacy Consult for Heparin Indication: pulmonary embolus (saddle)  Allergies  Allergen Reactions  . Bee Venom Anaphylaxis  . Penicillins Itching and Swelling    Pt has tolerated cephalosporins in the past Has patient had a PCN reaction causing immediate rash, facial/tongue/throat swelling, SOB or lightheadedness with hypotension: unknown Has patient had a PCN reaction causing severe rash involving mucus membranes or skin necrosis: unknown Has patient had a PCN reaction that required hospitalization: unknown Has patient had a PCN reaction occurring within the last 10 years: unknown If all of the above answers are "NO", then may proceed with Cephalosporin [place]    Patient Measurements: Height: 5\' 7"  (170.2 cm) Weight: 198 lb (89.8 kg) IBW/kg (Calculated) : 66.1 HEPARIN DW (KG): 84.8   Vital Signs: Temp: 97.7 F (36.5 C) (10/02 0710) Temp Source: Oral (10/02 0710) BP: 135/94 (10/02 1000) Pulse Rate: 98 (10/02 1000)  Labs:  Recent Labs  07/30/17 0938 07/30/17 0938 07/30/17 1723 07/30/17 1800 07/30/17 2018 07/31/17 0125 07/31/17 0630 07/31/17 0917  HGB 12.4*  --   --   --   --  11.5*  --   --   HCT 37.9*  --   --   --   --  35.7*  --   --   PLT 129*  --   --   --   --  128*  --   --   APTT  --   --   --  33  --   --   --   --   LABPROT  --   --  14.2  --   --   --   --   --   INR  --   --  1.11  --   --   --   --   --   HEPARINUNFRC  --   --   --   --   --  0.34  --  1.68*  CREATININE  --  1.1  --   --  1.02 0.97  --   --   TROPONINI  --   --   --   --   --  <0.03 <0.03  --     Estimated Creatinine Clearance: 85.5 mL/min (by C-G formula based on SCr of 0.97 mg/dL).   Medical History: Past Medical History:  Diagnosis Date  . Adenocarcinoma of left lung, stage 3 ([place]) 05/10/2017  . Aortic atherosclerosis ([place]) 07/31/2017  . Asthma   . Chronic diastolic CHF (congestive heart failure) ([place])   . Lung mass   . Non-small  cell lung cancer ([place]) 11/13/2015  . Obesity   . Perforated bowel ([place])     Medications:  Prescriptions Prior to Admission  Medication Sig Dispense Refill Last Dose  . aspirin EC 81 MG EC tablet Take 1 tablet (81 mg total) by mouth daily. 30 tablet 1 07/29/2017 at Unknown time  . magic mouthwash w/lidocaine SOLN Take 10 mLs by mouth 4 (four) times daily as needed (pain with swallowing). 960 mL 0 Past Week at Unknown time  . OXYGEN Inhale 3 L into the lungs continuous.   07/30/2017 at Unknown time  . potassium chloride SA (KLOR-CON M20) 20 MEQ tablet Take 1 tablet (20 mEq total) by mouth daily. 30 tablet 6 07/29/2017 at Unknown time  . prochlorperazine (COMPAZINE) 10 MG tablet Take 1 tablet (10 mg total) by mouth every 6 (six) hours as needed for nausea or vomiting. [place]  tablet 0 unknown  . sucralfate (CARAFATE) 1 g tablet Take 1 tablet (1 g total) by mouth 4 (four) times daily -  with meals and at bedtime. 5 min before meals for radiation induced esophagitis 120 tablet 2 07/29/2017 at Unknown time  . Wound Cleansers (RADIAPLEX EX) Apply 1 application topically at bedtime.   07/29/2017 at Unknown time  . fluticasone furoate-vilanterol (BREO ELLIPTA) 200-25 MCG/INH AEPB Inhale 1 puff into the lungs daily. (Patient not taking: Reported on 07/30/2017) 1 each 5 Not Taking at Unknown time  . furosemide (LASIX) 40 MG tablet Take 3 tablets (120 mg total) by mouth daily. (Patient not taking: Reported on 07/30/2017) 90 tablet 6 Not Taking at Unknown time    Home med list reviewed, med rec this admission pending.  No anticoagulants listed.  Assessment: Okay for Protocol, PT/INR wnl in early September 2018.  No bleeding noted.  Baseline coag labs this admission pending.  PLTC 129 K. HL this AM was therapeutic at 0.32; the f/u HL this AM at 0917 was supratherapeutic.   Goal of Therapy:  Heparin level 0.3-0.7 units/ml Monitor platelets by anticoagulation protocol: Yes   Plan:  Hold heparin for 1 hour, then  decrease heparin infusion to 1300 units/hr Check anti-Xa level in 6-8 hours and daily while on heparin Continue to monitor H&H and platelets  Isac Sarna, BS [place], BCPS Clinical Pharmacist Pager [phone] 07/31/2017,11:12 AM

## 2017-08-01 ENCOUNTER — Inpatient Hospital Stay (HOSPITAL_COMMUNITY): Payer: Medicare Other

## 2017-08-01 DIAGNOSIS — I82431 Acute embolism and thrombosis of right popliteal vein: Secondary | ICD-10-CM

## 2017-08-01 DIAGNOSIS — D696 Thrombocytopenia, unspecified: Secondary | ICD-10-CM

## 2017-08-01 DIAGNOSIS — I2692 Saddle embolus of pulmonary artery without acute cor pulmonale: Secondary | ICD-10-CM

## 2017-08-01 DIAGNOSIS — J9610 Chronic respiratory failure, unspecified whether with hypoxia or hypercapnia: Secondary | ICD-10-CM | POA: Diagnosis present

## 2017-08-01 DIAGNOSIS — J9611 Chronic respiratory failure with hypoxia: Secondary | ICD-10-CM

## 2017-08-01 DIAGNOSIS — J449 Chronic obstructive pulmonary disease, unspecified: Secondary | ICD-10-CM

## 2017-08-01 LAB — BASIC METABOLIC PANEL
Anion gap: 6 (ref 5–15)
BUN: 6 mg/dL (ref 6–20)
CALCIUM: 8.9 mg/dL (ref 8.9–10.3)
CHLORIDE: 102 mmol/L (ref 101–111)
CO2: 29 mmol/L (ref 22–32)
CREATININE: 0.89 mg/dL (ref 0.61–1.24)
GFR calc Af Amer: >60 mL/min (ref >60)
GFR calc non Af Amer: >60 mL/min (ref >60)
GLUCOSE: 129 mg/dL — AB (ref 65–99)
Potassium: 4.4 mmol/L (ref 3.5–5.1)
Sodium: 137 mmol/L (ref 135–145)

## 2017-08-01 LAB — HEPARIN LEVEL (UNFRACTIONATED): Heparin Unfractionated: 1.02 IU/mL — ABNORMAL HIGH (ref 0.30–0.70)

## 2017-08-01 LAB — CBC
HCT: 36.4 % — ABNORMAL LOW (ref 39.0–52.0)
Hemoglobin: 11.7 g/dL — ABNORMAL LOW (ref 13.0–17.0)
MCH: 26.4 pg (ref 26.0–34.0)
MCHC: 32.1 g/dL (ref 30.0–36.0)
MCV: 82 fL (ref 78.0–100.0)
Platelets: 158 10*3/uL (ref 150–400)
RBC: 4.44 MIL/uL (ref 4.22–5.81)
RDW: 16.2 % — AB (ref 11.5–15.5)
WBC: 5.4 10*3/uL (ref 4.0–10.5)

## 2017-08-01 LAB — HIV ANTIBODY (ROUTINE TESTING W REFLEX): HIV SCREEN 4TH GENERATION: NONREACTIVE

## 2017-08-01 LAB — ECHOCARDIOGRAM COMPLETE
Height: 67 in
WEIGHTICAEL: 3168 [oz_av]

## 2017-08-01 MED ORDER — HEPARIN SOD (PORK) LOCK FLUSH 100 UNIT/ML IV SOLN
500.0000 [IU] | INTRAVENOUS | Status: AC | PRN
  Administered 2017-08-01: 500 [IU]
  Filled 2017-08-01: qty 5

## 2017-08-01 MED ORDER — ENOXAPARIN SODIUM 150 MG/ML ~~LOC~~ SOLN
1.5000 mg/kg | SUBCUTANEOUS | Status: DC
  Administered 2017-08-01: 135 mg via SUBCUTANEOUS
  Filled 2017-08-01 (×3): qty 0.9

## 2017-08-01 MED ORDER — ENOXAPARIN SODIUM 150 MG/ML ~~LOC~~ SOLN: 1.5000 mg/kg | SUBCUTANEOUS | 1 refills | Status: DC

## 2017-08-01 MED ORDER — HEPARIN (PORCINE) IN NACL 100-0.45 UNIT/ML-% IJ SOLN
800.0000 [IU]/h | INTRAMUSCULAR | Status: DC
  Administered 2017-08-01: 800 [IU]/h via INTRAVENOUS

## 2017-08-01 NOTE — Progress Notes (Signed)
ANTICOAGULATION CONSULT NOTE - follow up  Pharmacy Consult for Heparin Indication: pulmonary embolus (saddle)  Allergies  Allergen Reactions  . Bee Venom Anaphylaxis  . Penicillins Itching and Swelling    Pt has tolerated cephalosporins in the past Has patient had a PCN reaction causing immediate rash, facial/tongue/throat swelling, SOB or lightheadedness with hypotension: unknown Has patient had a PCN reaction causing severe rash involving mucus membranes or skin necrosis: unknown Has patient had a PCN reaction that required hospitalization: unknown Has patient had a PCN reaction occurring within the last 10 years: unknown If all of the above answers are "NO", then may proceed with Cephalosporin [place]    Patient Measurements: Height: 5\' 7"  (170.2 cm) Weight: 198 lb (89.8 kg) IBW/kg (Calculated) : 66.1 HEPARIN DW (KG): 84.8   Vital Signs: Temp: 98.4 F (36.9 C) (10/03 0400) Temp Source: Oral (10/03 0400) BP: 122/80 (10/03 0400) Pulse Rate: 90 (10/03 0424)  Labs:  Recent Labs  07/30/17 0938  07/30/17 1723 07/30/17 1800 07/30/17 2018  07/31/17 0125 07/31/17 0630 07/31/17 0917 07/31/17 1930 08/01/17 0413  HGB 12.4*  --   --   --   --   --  11.5*  --   --   --  11.7*  HCT 37.9*  --   --   --   --   --  35.7*  --   --   --  36.4*  PLT 129*  --   --   --   --   --  128*  --   --   --  158  APTT  --   --   --  33  --   --   --   --   --   --   --   LABPROT  --   --  14.2  --   --   --   --   --   --   --   --   INR  --   --  1.11  --   --   --   --   --   --   --   --   HEPARINUNFRC  --   --   --   --   --   < > 0.34  --  1.68* 1.48* 1.02*  CREATININE  --   < >  --   --  1.02  --  0.97  --   --   --  0.89  TROPONINI  --   --   --   --   --   --  <0.03 <0.03  --   --   --   < > = values in this interval not displayed.  Estimated Creatinine Clearance: 93.2 mL/min (by C-G formula based on SCr of 0.89 mg/dL).   Medical History: Past Medical History:  Diagnosis Date  .  Adenocarcinoma of left lung, stage 3 ([place]) 05/10/2017  . Aortic atherosclerosis ([place]) 07/31/2017  . Asthma   . Chronic diastolic CHF (congestive heart failure) ([place])   . Lung mass   . Non-small cell lung cancer ([place]) 11/13/2015  . Obesity   . Perforated bowel ([place])     Medications:  Prescriptions Prior to Admission  Medication Sig Dispense Refill Last Dose  . aspirin EC 81 MG EC tablet Take 1 tablet (81 mg total) by mouth daily. 30 tablet 1 07/29/2017 at Unknown time  . magic mouthwash w/lidocaine SOLN Take 10 mLs by mouth 4 (four)  times daily as needed (pain with swallowing). 960 mL 0 Past Week at Unknown time  . OXYGEN Inhale 3 L into the lungs continuous.   07/30/2017 at Unknown time  . potassium chloride SA (KLOR-CON M20) 20 MEQ tablet Take 1 tablet (20 mEq total) by mouth daily. 30 tablet 6 07/29/2017 at Unknown time  . prochlorperazine (COMPAZINE) 10 MG tablet Take 1 tablet (10 mg total) by mouth every 6 (six) hours as needed for nausea or vomiting. 30 tablet 0 unknown  . sucralfate (CARAFATE) 1 g tablet Take 1 tablet (1 g total) by mouth 4 (four) times daily -  with meals and at bedtime. 5 min before meals for radiation induced esophagitis 120 tablet 2 07/29/2017 at Unknown time  . Wound Cleansers (RADIAPLEX EX) Apply 1 application topically at bedtime.   07/29/2017 at Unknown time  . fluticasone furoate-vilanterol (BREO ELLIPTA) 200-25 MCG/INH AEPB Inhale 1 puff into the lungs daily. (Patient not taking: Reported on 07/30/2017) 1 each 5 Not Taking at Unknown time  . furosemide (LASIX) 40 MG tablet Take 3 tablets (120 mg total) by mouth daily. (Patient not taking: Reported on 07/30/2017) 90 tablet 6 Not Taking at Unknown time    Home med list reviewed, med rec this admission pending.  No anticoagulants listed.  Assessment: Okay for Protocol, PT/INR wnl in early September 2018.  No bleeding noted.  Marland Kitchen  PLTC 129 K--> 158 K. HL this AM was  supratherapeutic at 1.02.   Goal of Therapy:  Heparin  level 0.3-0.7 units/ml Monitor platelets by anticoagulation protocol: Yes   Plan:  Hold heparin for 1 hour, then decrease heparin infusion to 800 units/hr Check anti-Xa level in 6-8 hours and daily while on heparin Continue to monitor H&H and platelets  James Zamora, BS [place], BCPS Clinical Pharmacist Pager [phone] 08/01/2017,8:20 AM

## 2017-08-01 NOTE — Progress Notes (Signed)
Pt ambulated around the unit x1. Tolerated well. O2 sat 97% on 3L. HR 102. No complaints of pain or discomfort.

## 2017-08-01 NOTE — Progress Notes (Signed)
Orders received for discharge. Instructions and medications discussed with pt and his sister. Pt aware of all follow-up appointments. All questions answered. Understanding verbalized. Port flushed with heparin and deaccessed. No complications. Pt currently waiting on ride home.

## 2017-08-01 NOTE — Discharge Summary (Signed)
Physician Discharge Summary  RITTER HELSLEY RWE:[id number] DOB: 01/17/1956 DOA: 07/30/2017  PCP: [name], MD  Admit date: 07/30/2017 Discharge date: 08/01/2017  Admitted From: home Disposition:  home  Recommendations for Outpatient Follow-up:  1. Follow up with PCP in 1-2 weeks 2. Please obtain BMP/CBC in one week 3. Follow up with primary oncologist on 10/4 as previously scheduled   Home Health: [facility] Equipment/Devices:  Discharge Condition: stable CODE STATUS: full code Diet recommendation: Heart Healthy   Brief/Interim Summary: 61 year old man PMH stage III non-small cell lung cancer currently undergoing chemotherapy, who had an outpatient CT done to restage which surprisingly showed submassive pulmonary embolism and the patient was therefore sent to the emergency department. Patient was admitted for treatment of the same.   Discharge Diagnoses:  Principal Problem:   Pulmonary embolism (HCC) Active Problems:   COPD (chronic obstructive pulmonary disease) (HCC)   Adenocarcinoma of left lung, stage 3 (HCC)   Asthma   Thrombocytopenia (HCC)   Aortic atherosclerosis (HCC)   Acute deep vein thrombosis (DVT) of right popliteal vein (HCC)   Chronic respiratory failure (HCC)  Saddle pulmonary embolus, submassive by criteria with evidence of right heart strain on CT. -Relatively asymptomatic. Respiratory status reported at baseline. Oxygen requirement at baseline. Hemodynamics stable. -Echocardiogram also unremarkable. -Patient treated with intravenous heparin. -Case discussed with Dr. Julien Nordmann regarding anticoagulation. He has recommended the patient may discharged on Lovenox therapy. This was discussed with case management to feel that patient should have appropriate insurance coverage and this should not be a problem. Home health set up to assist with injections.  Right popliteal DVT -Found on venous Dopplers as part of PE workup. -Continue anticoagulation as noted  above.  Stage III non-small cell lung cancer currently undergoing chemotherapy -Follow-up with Dr. Earlie Server as an outpatient on 10/4 as previously scheduled  Chronic hypoxic respiratory failure -Stable, secondary to lung cancer, COPD  Thrombocytopenia secondary to chemotherapy -Follow clinically  Abnormal appearance of the pancreas with known pancreatic head mass -Asymptomatic, no further evaluation at this time.  Aortic atherosclerosis -Asymptomatic, no further evaluation  Discharge Instructions  Discharge Instructions    Diet - low sodium heart healthy    Complete by:  As directed    Increase activity slowly    Complete by:  As directed      Allergies as of 08/01/2017      Reactions   Bee Venom Anaphylaxis   Penicillins Itching, Swelling   Pt has tolerated cephalosporins in the past Has patient had a PCN reaction causing immediate rash, facial/tongue/throat swelling, SOB or lightheadedness with hypotension: unknown Has patient had a PCN reaction causing severe rash involving mucus membranes or skin necrosis: unknown Has patient had a PCN reaction that required hospitalization: unknown Has patient had a PCN reaction occurring within the last 10 years: unknown If all of the above answers are "NO", then may proceed with Cephalosporin [place]      Medication List    TAKE these medications   aspirin 81 MG EC tablet Take 1 tablet (81 mg total) by mouth daily.   enoxaparin 150 MG/ML injection Commonly known as:  LOVENOX Inject 0.9 mLs (135 mg total) into the skin daily.   fluticasone furoate-vilanterol 200-25 MCG/INH Aepb Commonly known as:  BREO ELLIPTA Inhale 1 puff into the lungs daily.   furosemide 40 MG tablet Commonly known as:  LASIX Take 3 tablets (120 mg total) by mouth daily.   magic mouthwash w/lidocaine Soln Take 10 mLs by mouth 4 (  four) times daily as needed (pain with swallowing).   OXYGEN Inhale 3 L into the lungs continuous.   potassium chloride  SA 20 MEQ tablet Commonly known as:  KLOR-CON M20 Take 1 tablet (20 mEq total) by mouth daily.   prochlorperazine 10 MG tablet Commonly known as:  COMPAZINE Take 1 tablet (10 mg total) by mouth every 6 (six) hours as needed for nausea or vomiting.   RADIAPLEX EX Apply 1 application topically at bedtime.   sucralfate 1 g tablet Commonly known as:  CARAFATE Take 1 tablet (1 g total) by mouth 4 (four) times daily -  with meals and at bedtime. 5 min before meals for radiation induced esophagitis      Follow-up Information    Curt Bears, MD Follow up on 08/02/2017.   Specialty:  Oncology Contact information: [address] [place] [address] [phone]          Allergies  Allergen Reactions  . Bee Venom Anaphylaxis  . Penicillins Itching and Swelling    Pt has tolerated cephalosporins in the past Has patient had a PCN reaction causing immediate rash, facial/tongue/throat swelling, SOB or lightheadedness with hypotension: unknown Has patient had a PCN reaction causing severe rash involving mucus membranes or skin necrosis: unknown Has patient had a PCN reaction that required hospitalization: unknown Has patient had a PCN reaction occurring within the last 10 years: unknown If all of the above answers are "NO", then may proceed with Cephalosporin [place]    Consultations:     Procedures/Studies: Ct Chest W Contrast  Result Date: 07/30/2017 CLINICAL DATA:  61 year old male with history of left-sided lung cancer status post chemotherapy and radiation therapy now complete. On home oxygen. EXAM: CT CHEST WITH CONTRAST TECHNIQUE: Multidetector CT imaging of the chest was performed during intravenous contrast administration. CONTRAST:  14mL ISOVUE-300 IOPAMIDOL (ISOVUE-300) INJECTION 61% COMPARISON:  Chest CT 02/21/2017.  PET-CT 03/09/2017. FINDINGS: Cardiovascular: Large filling defect which extends across the the bifurcation of the pulmonic trunk extending into  lobar, segmental and subsegmental sized pulmonary artery is bilaterally, indicative of a saddle embolus. Right ventricle appears dilated (4.7 cm) as compared with the (3.5 cm), within RV to LV ratio of 1.34. There is no significant pericardial fluid, thickening or pericardial calcification. Aortic atherosclerosis. No definite coronary artery calcifications are identified. Right internal jugular single-lumen porta cath with tip terminating in the right atrium. Mediastinum/Nodes: Mildly enlarged low left paratracheal lymph node measuring 11 mm in short axis. No other definite mediastinal or hilar lymphadenopathy. Esophagus is unremarkable in appearance. No axillary lymphadenopathy. Lungs/Pleura: Previously noted left lower lobe mass measures 3.5 x 3.3 x 3.7 cm (axial image 69 of series 7 and sagittal image 108 of series 6). This mass again demonstrates macrolobulated slightly spiculated margins with extends posteriorly to the overlying pleura which is mildly thickened. Slight haziness and septal thickening now surround the lesion, which could be reflection of very early postradiation changes. Subpleural satellite nodule measuring 6 mm in the superior segment of the left lower lobe abutting the major fissure (axial image 66 of series 7) is noted, but nonspecific. No other definite suspicious appearing pulmonary nodules or masses are noted. No acute consolidative airspace disease. No pleural effusions. Mild diffuse bronchial wall thickening with mild centrilobular and paraseptal emphysema. Upper Abdomen: Mild soft tissue stranding adjacent to the pancreas is new compared to the prior examination, and nonspecific, but can be seen in the setting of an acute pancreatitis. Notably, of the head of the pancreas  is incompletely visualized (previously there was a lesion noted in the pancreatic head). Status post cholecystectomy. Chronic nodular thickening of the left adrenal gland with some faint calcifications, similar to  prior examinations dating back to 2011, presumably benign. Musculoskeletal: Chronic irregular lucent lesion in the right side of the manubrium, similar to prior studies dating back to 2011, presumably benign. There are no aggressive appearing lytic or blastic lesions noted in the visualized portions of the skeleton. IMPRESSION: 1. Large saddle embolus extending into the pulmonary arterial tree of both lungs, with evidence of right-sided heart strain (RV/LV Ratio = 1.34) consistent with at least submassive (intermediate risk) PE. The presence of right heart strain has been associated with an increased risk of morbidity and mortality. Please activate Code PE by paging [phone]. Critical Value/emergent results were called by telephone at the time of interpretation on 07/30/2017 at 3:32 pm to Dr. Curt Bears, who verbally acknowledged these results. 2. 3.5 x 3.3 x 3.7 cm left lower lobe mass appears very similar to prior examinations. Mildly enlarged low left paratracheal lymph node is suspicious for a metastatic lesion. 3. New subtle inflammatory changes adjacent to the body and tail of the pancreas concerning for an acute pancreatitis. Clinical correlation and correlation with lipase levels is recommended. Notably, the pancreatic head was not visualized on today's examination, but a hypermetabolic lesion was noted in the pancreatic head on prior PET-CT. The possibility of ductal obstruction should be considered. 4. Aortic atherosclerosis. 5. Mild diffuse bronchial wall thickening with mild centrilobular and paraseptal emphysema; imaging findings suggestive of underlying COPD. Aortic Atherosclerosis (ICD10-I70.0) and Emphysema (ICD10-J43.9). Electronically Signed   By: Vinnie Langton M.D.   On: 07/30/2017 15:43   US Venous Img Lower Bilateral  Result Date: 08/01/2017 CLINICAL DATA:  Bilateral lower extremity edema, pulmonary embolus EXAM: BILATERAL LOWER EXTREMITY VENOUS DOPPLER ULTRASOUND TECHNIQUE:  Gray-scale sonography with graded compression, as well as color Doppler and duplex ultrasound were performed to evaluate the lower extremity deep venous systems from the level of the common femoral vein and including the common femoral, femoral, profunda femoral, popliteal and calf veins including the posterior tibial, peroneal and gastrocnemius veins when visible. The superficial great saphenous vein was also interrogated. Spectral Doppler was utilized to evaluate flow at rest and with distal augmentation maneuvers in the common femoral, femoral and popliteal veins. COMPARISON:  None. FINDINGS: RIGHT LOWER EXTREMITY Common Femoral Vein: No evidence of thrombus. Normal compressibility, respiratory phasicity and response to augmentation. Saphenofemoral Junction: No evidence of thrombus. Normal compressibility and flow on color Doppler imaging. Profunda Femoral Vein: No evidence of thrombus. Normal compressibility and flow on color Doppler imaging. Femoral Vein: No evidence of thrombus. Normal compressibility, respiratory phasicity and response to augmentation. Popliteal Vein: Expansile thrombus within the right popliteal vein, subocclusive proximally. Calf Veins: No evidence of thrombus. Normal compressibility and flow on color Doppler imaging. Superficial Great Saphenous Vein: No evidence of thrombus. Normal compressibility and flow on color Doppler imaging. Venous Reflux:  None. Other Findings:  None. LEFT LOWER EXTREMITY Common Femoral Vein: No evidence of thrombus. Normal compressibility, respiratory phasicity and response to augmentation. Saphenofemoral Junction: No evidence of thrombus. Normal compressibility and flow on color Doppler imaging. Profunda Femoral Vein: No evidence of thrombus. Normal compressibility and flow on color Doppler imaging. Femoral Vein: No evidence of thrombus. Normal compressibility, respiratory phasicity and response to augmentation. Popliteal Vein: No evidence of thrombus. Normal  compressibility, respiratory phasicity and response to augmentation. Calf Veins: No evidence of thrombus. Normal compressibility  and flow on color Doppler imaging. Superficial Great Saphenous Vein: No evidence of thrombus. Normal compressibility and flow on color Doppler imaging. Venous Reflux:  None. Other Findings:  None. IMPRESSION: Deep venous thrombosis within the right popliteal vein. No evidence of deep venous thrombosis in the left lower extremity. Electronically Signed   By: Julian Hy M.D.   On: 08/01/2017 14:56   Ir US Guide Vasc Access Right  Result Date: 07/11/2017 CLINICAL DATA:  Lung cancer, access for chemotherapy EXAM: RIGHT INTERNAL JUGULAR SINGLE LUMEN POWER PORT CATHETER INSERTION Date:  9/12/20189/09/2017 3:02 pm Radiologist:  M. Daryll Brod, MD Guidance:  Ultrasound and fluoroscopic MEDICATIONS: 1 g vancomycin; The antibiotic was administered within an appropriate time interval prior to skin puncture. ANESTHESIA/SEDATION: Versed 2.0 mg IV; Fentanyl 100 mcg IV; Moderate Sedation Time:  28 minutes The patient was continuously monitored during the procedure by the interventional radiology nurse under my direct supervision. FLUOROSCOPY TIME:  30 seconds (7 mGy) COMPLICATIONS: None immediate. CONTRAST:  None. PROCEDURE: Informed consent was obtained from the patient following explanation of the procedure, risks, benefits and alternatives. The patient understands, agrees and consents for the procedure. All questions were addressed. A time out was performed. Maximal barrier sterile technique utilized including caps, mask, sterile gowns, sterile gloves, large sterile drape, hand hygiene, and 2% chlorhexidine scrub. Under sterile conditions and local anesthesia, right internal jugular micropuncture venous access was performed. Access was performed with ultrasound. Images were obtained for documentation. A guide wire was inserted followed by a transitional dilator. This allowed insertion of a  guide wire and catheter into the IVC. Measurements were obtained from the SVC / RA junction back to the right IJ venotomy site. In the right infraclavicular chest, a subcutaneous pocket was created over the second anterior rib. This was done under sterile conditions and local anesthesia. 1% lidocaine with epinephrine was utilized for this. A 2.5 cm incision was made in the skin. Blunt dissection was performed to create a subcutaneous pocket over the right pectoralis major muscle. The pocket was flushed with saline vigorously. There was adequate hemostasis. The port catheter was assembled and checked for leakage. The port catheter was secured in the pocket with two retention sutures. The tubing was tunneled subcutaneously to the right venotomy site and inserted into the SVC/RA junction through a valved peel-away sheath. Position was confirmed with fluoroscopy. Images were obtained for documentation. The patient tolerated the procedure well. No immediate complications. Incisions were closed in a two layer fashion with 4 - 0 Vicryl suture. Dermabond was applied to the skin. The port catheter was accessed, blood was aspirated followed by saline and heparin flushes. Needle was removed. A dry sterile dressing was applied. IMPRESSION: Ultrasound and fluoroscopically guided right internal jugular single lumen power port catheter insertion. Tip in the SVC/RA junction. Catheter ready for use. Electronically Signed   By: Jerilynn Mages.  Shick M.D.   On: 07/11/2017 15:07   Ir Fluoro Guide Port Insertion Right  Result Date: 07/11/2017 CLINICAL DATA:  Lung cancer, access for chemotherapy EXAM: RIGHT INTERNAL JUGULAR SINGLE LUMEN POWER PORT CATHETER INSERTION Date:  9/12/20189/09/2017 3:02 pm Radiologist:  M. Daryll Brod, MD Guidance:  Ultrasound and fluoroscopic MEDICATIONS: 1 g vancomycin; The antibiotic was administered within an appropriate time interval prior to skin puncture. ANESTHESIA/SEDATION: Versed 2.0 mg IV; Fentanyl 100 mcg  IV; Moderate Sedation Time:  28 minutes The patient was continuously monitored during the procedure by the interventional radiology nurse under my direct supervision. FLUOROSCOPY TIME:  30 seconds (  7 mGy) COMPLICATIONS: None immediate. CONTRAST:  None. PROCEDURE: Informed consent was obtained from the patient following explanation of the procedure, risks, benefits and alternatives. The patient understands, agrees and consents for the procedure. All questions were addressed. A time out was performed. Maximal barrier sterile technique utilized including caps, mask, sterile gowns, sterile gloves, large sterile drape, hand hygiene, and 2% chlorhexidine scrub. Under sterile conditions and local anesthesia, right internal jugular micropuncture venous access was performed. Access was performed with ultrasound. Images were obtained for documentation. A guide wire was inserted followed by a transitional dilator. This allowed insertion of a guide wire and catheter into the IVC. Measurements were obtained from the SVC / RA junction back to the right IJ venotomy site. In the right infraclavicular chest, a subcutaneous pocket was created over the second anterior rib. This was done under sterile conditions and local anesthesia. 1% lidocaine with epinephrine was utilized for this. A 2.5 cm incision was made in the skin. Blunt dissection was performed to create a subcutaneous pocket over the right pectoralis major muscle. The pocket was flushed with saline vigorously. There was adequate hemostasis. The port catheter was assembled and checked for leakage. The port catheter was secured in the pocket with two retention sutures. The tubing was tunneled subcutaneously to the right venotomy site and inserted into the SVC/RA junction through a valved peel-away sheath. Position was confirmed with fluoroscopy. Images were obtained for documentation. The patient tolerated the procedure well. No immediate complications. Incisions were closed  in a two layer fashion with 4 - 0 Vicryl suture. Dermabond was applied to the skin. The port catheter was accessed, blood was aspirated followed by saline and heparin flushes. Needle was removed. A dry sterile dressing was applied. IMPRESSION: Ultrasound and fluoroscopically guided right internal jugular single lumen power port catheter insertion. Tip in the SVC/RA junction. Catheter ready for use. Electronically Signed   By: Jerilynn Mages.  Shick M.D.   On: 07/11/2017 15:07    Echo: - Left ventricle: The cavity size was normal. Wall thickness was   normal. Systolic function was vigorous. The estimated ejection   fraction was in the range of 65% to 70%. Wall motion was normal;   there were no regional wall motion abnormalities. Doppler   parameters are consistent with abnormal left ventricular   relaxation (grade 1 diastolic dysfunction).   Subjective: Shortness of breath improving, no chest pain  Discharge Exam: Vitals:   08/01/17 1400 08/01/17 1500  BP: 129/80 114/74  Pulse: 79 92  Resp: 18 (!) 21  Temp:    SpO2: 96% 100%   Vitals:   08/01/17 1200 08/01/17 1300 08/01/17 1400 08/01/17 1500  BP: 117/71 132/77 129/80 114/74  Pulse: 88 86 79 92  Resp: 18 17 18  (!) 21  Temp:      TempSrc:      SpO2: 96% 98% 96% 100%  Weight:      Height:        General: Pt is alert, awake, not in acute distress Cardiovascular: RRR, S1/S2 +, no rubs, no gallops Respiratory: CTA bilaterally, no wheezing, no rhonchi Abdominal: Soft, NT, ND, bowel sounds + Extremities: no edema, no cyanosis    The results of significant diagnostics from this hospitalization (including imaging, microbiology, ancillary and laboratory) are listed below for reference.     Microbiology: Recent Results (from the past 240 hour(s))  MRSA PCR Screening     Status: None   Collection Time: 07/30/17  9:43 PM  Result Value Ref Range  Status   MRSA by PCR NEGATIVE NEGATIVE Final    Comment:        The GeneXpert MRSA Assay  (FDA approved for NASAL specimens only), is one component of a comprehensive MRSA colonization surveillance program. It is not intended to diagnose MRSA infection nor to guide or monitor treatment for MRSA infections.      Labs: BNP (last 3 results)  Recent Labs  03/08/17 1851  BNP 74.1   Basic Metabolic Panel:  Recent Labs Lab 07/30/17 0938 07/30/17 2018 07/31/17 0125 08/01/17 0413  NA 140 138 140 137  K 3.4* 3.4* 3.9 4.4  CL  --  105 104 102  CO2 27 29 30 29   GLUCOSE 104 101* 121* 129*  BUN 5.3* 6 6 6   CREATININE 1.1 1.02 0.97 0.89  CALCIUM 9.5 8.7* 8.7* 8.9  MG  --  1.6*  --   --    Liver Function Tests:  Recent Labs Lab 07/30/17 0938 07/30/17 2018  AST 18 18  ALT 23 23  ALKPHOS 87 80  BILITOT 0.30 0.5  PROT 7.2 7.0  ALBUMIN 3.0* 3.1*   No results for input(s): LIPASE, AMYLASE in the last 168 hours. No results for input(s): AMMONIA in the last 168 hours. CBC:  Recent Labs Lab 07/30/17 0938 07/31/17 0125 08/01/17 0413  WBC 4.4 3.6* 5.4  NEUTROABS 2.9  --   --   HGB 12.4* 11.5* 11.7*  HCT 37.9* 35.7* 36.4*  MCV 82.0 81.5 82.0  PLT 129* 128* 158   Cardiac Enzymes:  Recent Labs Lab 07/31/17 0125 07/31/17 0630  TROPONINI <0.03 <0.03   BNP: Invalid input(s): POCBNP CBG: No results for input(s): GLUCAP in the last 168 hours. D-Dimer No results for input(s): DDIMER in the last 72 hours. Hgb A1c No results for input(s): HGBA1C in the last 72 hours. Lipid Profile No results for input(s): CHOL, HDL, LDLCALC, TRIG, CHOLHDL, LDLDIRECT in the last 72 hours. Thyroid function studies No results for input(s): TSH, T4TOTAL, T3FREE, THYROIDAB in the last 72 hours.  Invalid input(s): FREET3 Anemia work up No results for input(s): VITAMINB12, FOLATE, FERRITIN, TIBC, IRON, RETICCTPCT in the last 72 hours. Urinalysis    Component Value Date/Time   COLORURINE YELLOW 03/08/2017 1850   APPEARANCEUR CLEAR 03/08/2017 1850   LABSPEC 1.016  03/08/2017 1850   [place] 5.0 03/08/2017 1850   GLUCOSEU NEGATIVE 03/08/2017 1850   HGBUR NEGATIVE 03/08/2017 1850   BILIRUBINUR NEGATIVE 03/08/2017 1850   KETONESUR NEGATIVE 03/08/2017 1850   PROTEINUR NEGATIVE 03/08/2017 1850   UROBILINOGEN 0.2 01/16/2010 1955   NITRITE NEGATIVE 03/08/2017 1850   LEUKOCYTESUR NEGATIVE 03/08/2017 1850   Sepsis Labs Invalid input(s): PROCALCITONIN,  WBC,  LACTICIDVEN Microbiology Recent Results (from the past 240 hour(s))  MRSA PCR Screening     Status: None   Collection Time: 07/30/17  9:43 PM  Result Value Ref Range Status   MRSA by PCR NEGATIVE NEGATIVE Final    Comment:        The GeneXpert MRSA Assay (FDA approved for NASAL specimens only), is one component of a comprehensive MRSA colonization surveillance program. It is not intended to diagnose MRSA infection nor to guide or monitor treatment for MRSA infections.      Time coordinating discharge: Over 30 minutes  SIGNED:   Kathie Dike, MD  [facility] 08/01/2017, 5:55 PM Pager   If 7PM-7AM, please contact night-coverage [email or web address] Password [email or web address]

## 2017-08-01 NOTE — Progress Notes (Signed)
*  PRELIMINARY RESULTS* Echocardiogram 2D Echocardiogram has been performed.  Leavy Cella 08/01/2017, 11:31 AM

## 2017-08-02 ENCOUNTER — Telehealth: Payer: Self-pay | Admitting: Internal Medicine

## 2017-08-02 ENCOUNTER — Ambulatory Visit (HOSPITAL_BASED_OUTPATIENT_CLINIC_OR_DEPARTMENT_OTHER): Payer: Medicare Other | Admitting: Internal Medicine

## 2017-08-02 ENCOUNTER — Encounter: Payer: Self-pay | Admitting: Internal Medicine

## 2017-08-02 VITALS — BP 115/73 | HR 105 | Temp 98.0°F | Resp 17 | Ht 67.0 in | Wt 210.1 lb

## 2017-08-02 DIAGNOSIS — Z5112 Encounter for antineoplastic immunotherapy: Secondary | ICD-10-CM

## 2017-08-02 DIAGNOSIS — Z7189 Other specified counseling: Secondary | ICD-10-CM

## 2017-08-02 DIAGNOSIS — C3432 Malignant neoplasm of lower lobe, left bronchus or lung: Secondary | ICD-10-CM

## 2017-08-02 DIAGNOSIS — I2699 Other pulmonary embolism without acute cor pulmonale: Secondary | ICD-10-CM

## 2017-08-02 DIAGNOSIS — R5382 Chronic fatigue, unspecified: Secondary | ICD-10-CM

## 2017-08-02 HISTORY — DX: Chronic fatigue, unspecified: R53.82

## 2017-08-02 NOTE — Progress Notes (Signed)
[place] Telephone:([phone]) [phone]   [phone]  OFFICE PROGRESS NOTE  [name], MD [address] [place] [address]  DIAGNOSIS: Stage IIIA (T2a, N2, M0) non-small cell lung cancer, adenocarcinoma presented with left lower lobe lung mass in addition to mediastinal lymphadenopathy diagnosed in June 2018. The patient also has suspicious soft tissue density in the pancreatic head but this has been present for 3 years on previous CT scan and unlikely to be related to [name], last dose was given 07/03/2017.  CURRENT THERAPY: Consolidation treatment with immunotherapy with Imfinzi (Durvalumab) 10 MG/KG every 2 weeks, first dose 08/15/2017.  INTERVAL HISTORY: James Zamora 61 y.o. male returns to the clinic today for follow-up visit accompanied by his sister. The patient is feeling fine today with no specific complaints. He denied having any chest pain or shortness breath but continues to have mild cough with no hemoptysis. The patient denied having any fever or chills. He has no nausea, vomiting, diarrhea or constipation. He has no swelling of the lower extremities. He denied having any weight loss or night sweats. He tolerated his previous course of concurrent chemoradiation fairly well. He had repeat CT scan of the chest performed recently for restaging of his disease and he is here for evaluation and discussion of his scan results and treatment options. His scan also showed evidence for pulmonary embolism and the patient was admitted to [facility] for treatment with intravenous heparin and he was discharged home on subcutaneous Lovenox.    MEDICAL HISTORY: Past Medical History:  Diagnosis Date  . Adenocarcinoma of left lung, stage 3 ([place]) 05/10/2017  . Aortic atherosclerosis ([place])  07/31/2017  . Asthma   . Chronic diastolic CHF (congestive heart failure) ([place])   . Lung mass   . Non-small cell lung cancer ([place]) 11/13/2015  . Obesity   . Perforated bowel ([place])     ALLERGIES:  is allergic to bee venom and penicillins.  MEDICATIONS:  Current Outpatient Prescriptions  Medication Sig Dispense Refill  . aspirin EC 81 MG EC tablet Take 1 tablet (81 mg total) by mouth daily. 30 tablet 1  . enoxaparin (LOVENOX) 150 MG/ML injection Inject 0.9 mLs (135 mg total) into the skin daily. 30 Syringe 1  . fluticasone furoate-vilanterol (BREO ELLIPTA) 200-25 MCG/INH AEPB Inhale 1 puff into the lungs daily. 1 each 5  . furosemide (LASIX) 40 MG tablet Take 3 tablets (120 mg total) by mouth daily. 90 tablet 6  . magic mouthwash w/lidocaine SOLN Take 10 mLs by mouth 4 (four) times daily as needed (pain with swallowing). 960 mL 0  . OXYGEN Inhale 3 L into the lungs continuous.    . prochlorperazine (COMPAZINE) 10 MG tablet Take 1 tablet (10 mg total) by mouth every 6 (six) hours as needed for nausea or vomiting. 30 tablet 0  . sucralfate (CARAFATE) 1 g tablet Take 1 tablet (1 g total) by mouth 4 (four) times daily -  with meals and at bedtime. 5 min before meals for radiation induced esophagitis 120 tablet 2  . Wound Cleansers (RADIAPLEX EX) Apply 1 application topically at bedtime.    . potassium chloride SA (KLOR-CON M20) 20 MEQ tablet Take 1 tablet (20 mEq total) by mouth daily. 30 tablet 6   No current facility-administered medications for  this visit.     SURGICAL HISTORY:  Past Surgical History:  Procedure Laterality Date  . CHOLECYSTECTOMY    . COLONOSCOPY N/A 07/31/2016   Procedure: COLONOSCOPY;  Surgeon: Danie Binder, MD;  Location: [facility];  Service: Endoscopy;  Laterality: N/A;  1:45 pm  . ESOPHAGOGASTRODUODENOSCOPY N/A 07/31/2016   Procedure: ESOPHAGOGASTRODUODENOSCOPY (EGD);  Surgeon: Danie Binder, MD;  Location: [facility];  Service: Endoscopy;  Laterality: N/A;   . EUS N/A 06/07/2017   Procedure: UPPER ENDOSCOPIC ULTRASOUND (EUS) LINEAR;  Surgeon: Milus Banister, MD;  Location: [facility] ENDOS[facility];  Service: Endoscopy;  Laterality: N/A;  . HERNIA REPAIR    . IR FLUORO GUIDE PORT INSERTION RIGHT  07/11/2017  . IR US GUIDE VASC ACCESS RIGHT  07/11/2017    REVIEW OF SYSTEMS:  Constitutional: positive for fatigue Eyes: negative Ears, nose, mouth, throat, and face: negative Respiratory: positive for cough Cardiovascular: negative Gastrointestinal: negative Genitourinary:negative Integument/breast: negative Hematologic/lymphatic: negative Musculoskeletal:negative Neurological: negative Behavioral/Psych: negative Endocrine: negative Allergic/Immunologic: negative   PHYSICAL EXAMINATION: General appearance: alert, cooperative, fatigued and no distress Head: Normocephalic, without obvious abnormality, atraumatic Neck: no adenopathy, no JVD, supple, symmetrical, trachea midline and thyroid not enlarged, symmetric, no tenderness/mass/nodules Lymph nodes: Cervical, supraclavicular, and axillary nodes normal. Resp: clear to auscultation bilaterally Back: symmetric, no curvature. ROM normal. No CVA tenderness. Cardio: regular rate and rhythm, S1, S2 normal, no murmur, click, rub or gallop GI: soft, non-tender; bowel sounds normal; no masses,  no organomegaly Extremities: extremities normal, atraumatic, no cyanosis or edema Neurologic: Alert and oriented X 3, normal strength and tone. Normal symmetric reflexes. Normal coordination and gait  ECOG PERFORMANCE STATUS: 1 - Symptomatic but completely ambulatory  Blood pressure 115/73, pulse (!) 105, temperature 98 F (36.7 C), temperature source Oral, resp. rate 17, height 5\' 7"  (1.702 m), weight 210 lb 1.6 oz (95.3 kg), SpO2 94 %.  LABORATORY DATA: Lab Results  Component Value Date   WBC 5.4 08/01/2017   HGB 11.7 (L) 08/01/2017   HCT 36.4 (L) 08/01/2017   MCV 82.0 08/01/2017   PLT 158 08/01/2017       Chemistry      Component Value Date/Time   NA 137 08/01/2017 0413   NA 140 07/30/2017 0938   K 4.4 08/01/2017 0413   K 3.4 (L) 07/30/2017 0938   CL 102 08/01/2017 0413   CO2 29 08/01/2017 0413   CO2 27 07/30/2017 0938   BUN 6 08/01/2017 0413   BUN 5.3 (L) 07/30/2017 0938   CREATININE 0.89 08/01/2017 0413   CREATININE 1.1 07/30/2017 0938      Component Value Date/Time   CALCIUM 8.9 08/01/2017 0413   CALCIUM 9.5 07/30/2017 0938   ALKPHOS 80 07/30/2017 2018   ALKPHOS 87 07/30/2017 0938   AST 18 07/30/2017 2018   AST 18 07/30/2017 0938   ALT 23 07/30/2017 2018   ALT 23 07/30/2017 0938   BILITOT 0.5 07/30/2017 2018   BILITOT 0.30 07/30/2017 0240       RADIOGRAPHIC STUDIES: Ct Chest W Contrast  Result Date: 07/30/2017 CLINICAL DATA:  61 year old male with history of left-sided lung cancer status post chemotherapy and radiation therapy now complete. On home oxygen. EXAM: CT CHEST WITH CONTRAST TECHNIQUE: Multidetector CT imaging of the chest was performed during intravenous contrast administration. CONTRAST:  9mL ISOVUE-300 IOPAMIDOL (ISOVUE-300) INJECTION 61% COMPARISON:  Chest CT 02/21/2017.  PET-CT 03/09/2017. FINDINGS: Cardiovascular: Large filling defect which extends across the the bifurcation of the pulmonic trunk extending into  lobar, segmental and subsegmental sized pulmonary artery is bilaterally, indicative of a saddle embolus. Right ventricle appears dilated (4.7 cm) as compared with the (3.5 cm), within RV to LV ratio of 1.34. There is no significant pericardial fluid, thickening or pericardial calcification. Aortic atherosclerosis. No definite coronary artery calcifications are identified. Right internal jugular single-lumen porta cath with tip terminating in the right atrium. Mediastinum/Nodes: Mildly enlarged low left paratracheal lymph node measuring 11 mm in short axis. No other definite mediastinal or hilar lymphadenopathy. Esophagus is unremarkable in appearance. No  axillary lymphadenopathy. Lungs/Pleura: Previously noted left lower lobe mass measures 3.5 x 3.3 x 3.7 cm (axial image 69 of series 7 and sagittal image 108 of series 6). This mass again demonstrates macrolobulated slightly spiculated margins with extends posteriorly to the overlying pleura which is mildly thickened. Slight haziness and septal thickening now surround the lesion, which could be reflection of very early postradiation changes. Subpleural satellite nodule measuring 6 mm in the superior segment of the left lower lobe abutting the major fissure (axial image 66 of series 7) is noted, but nonspecific. No other definite suspicious appearing pulmonary nodules or masses are noted. No acute consolidative airspace disease. No pleural effusions. Mild diffuse bronchial wall thickening with mild centrilobular and paraseptal emphysema. Upper Abdomen: Mild soft tissue stranding adjacent to the pancreas is new compared to the prior examination, and nonspecific, but can be seen in the setting of an acute pancreatitis. Notably, of the head of the pancreas is incompletely visualized (previously there was a lesion noted in the pancreatic head). Status post cholecystectomy. Chronic nodular thickening of the left adrenal gland with some faint calcifications, similar to prior examinations dating back to 2011, presumably benign. Musculoskeletal: Chronic irregular lucent lesion in the right side of the manubrium, similar to prior studies dating back to 2011, presumably benign. There are no aggressive appearing lytic or blastic lesions noted in the visualized portions of the skeleton. IMPRESSION: 1. Large saddle embolus extending into the pulmonary arterial tree of both lungs, with evidence of right-sided heart strain (RV/LV Ratio = 1.34) consistent with at least submassive (intermediate risk) PE. The presence of right heart strain has been associated with an increased risk of morbidity and mortality. Please activate Code PE by  paging [phone]. Critical Value/emergent results were called by telephone at the time of interpretation on 07/30/2017 at 3:32 pm to Dr. Curt Bears, who verbally acknowledged these results. 2. 3.5 x 3.3 x 3.7 cm left lower lobe mass appears very similar to prior examinations. Mildly enlarged low left paratracheal lymph node is suspicious for a metastatic lesion. 3. New subtle inflammatory changes adjacent to the body and tail of the pancreas concerning for an acute pancreatitis. Clinical correlation and correlation with lipase levels is recommended. Notably, the pancreatic head was not visualized on today's examination, but a hypermetabolic lesion was noted in the pancreatic head on prior PET-CT. The possibility of ductal obstruction should be considered. 4. Aortic atherosclerosis. 5. Mild diffuse bronchial wall thickening with mild centrilobular and paraseptal emphysema; imaging findings suggestive of underlying COPD. Aortic Atherosclerosis (ICD10-I70.0) and Emphysema (ICD10-J43.9). Electronically Signed   By: Vinnie Langton M.D.   On: 07/30/2017 15:43   US Venous Img Lower Bilateral  Result Date: 08/01/2017 CLINICAL DATA:  Bilateral lower extremity edema, pulmonary embolus EXAM: BILATERAL LOWER EXTREMITY VENOUS DOPPLER ULTRASOUND TECHNIQUE: Gray-scale sonography with graded compression, as well as color Doppler and duplex ultrasound were performed to evaluate the lower extremity deep venous systems from the level of  the common femoral vein and including the common femoral, femoral, profunda femoral, popliteal and calf veins including the posterior tibial, peroneal and gastrocnemius veins when visible. The superficial great saphenous vein was also interrogated. Spectral Doppler was utilized to evaluate flow at rest and with distal augmentation maneuvers in the common femoral, femoral and popliteal veins. COMPARISON:  None. FINDINGS: RIGHT LOWER EXTREMITY Common Femoral Vein: No evidence of thrombus.  Normal compressibility, respiratory phasicity and response to augmentation. Saphenofemoral Junction: No evidence of thrombus. Normal compressibility and flow on color Doppler imaging. Profunda Femoral Vein: No evidence of thrombus. Normal compressibility and flow on color Doppler imaging. Femoral Vein: No evidence of thrombus. Normal compressibility, respiratory phasicity and response to augmentation. Popliteal Vein: Expansile thrombus within the right popliteal vein, subocclusive proximally. Calf Veins: No evidence of thrombus. Normal compressibility and flow on color Doppler imaging. Superficial Great Saphenous Vein: No evidence of thrombus. Normal compressibility and flow on color Doppler imaging. Venous Reflux:  None. Other Findings:  None. LEFT LOWER EXTREMITY Common Femoral Vein: No evidence of thrombus. Normal compressibility, respiratory phasicity and response to augmentation. Saphenofemoral Junction: No evidence of thrombus. Normal compressibility and flow on color Doppler imaging. Profunda Femoral Vein: No evidence of thrombus. Normal compressibility and flow on color Doppler imaging. Femoral Vein: No evidence of thrombus. Normal compressibility, respiratory phasicity and response to augmentation. Popliteal Vein: No evidence of thrombus. Normal compressibility, respiratory phasicity and response to augmentation. Calf Veins: No evidence of thrombus. Normal compressibility and flow on color Doppler imaging. Superficial Great Saphenous Vein: No evidence of thrombus. Normal compressibility and flow on color Doppler imaging. Venous Reflux:  None. Other Findings:  None. IMPRESSION: Deep venous thrombosis within the right popliteal vein. No evidence of deep venous thrombosis in the left lower extremity. Electronically Signed   By: Julian Hy M.D.   On: 08/01/2017 14:56   Ir US Guide Vasc Access Right  Result Date: 07/11/2017 CLINICAL DATA:  Lung cancer, access for chemotherapy EXAM: RIGHT INTERNAL  JUGULAR SINGLE LUMEN POWER PORT CATHETER INSERTION Date:  9/12/20189/09/2017 3:02 pm Radiologist:  M. Daryll Brod, MD Guidance:  Ultrasound and fluoroscopic MEDICATIONS: 1 g vancomycin; The antibiotic was administered within an appropriate time interval prior to skin puncture. ANESTHESIA/SEDATION: Versed 2.0 mg IV; Fentanyl 100 mcg IV; Moderate Sedation Time:  28 minutes The patient was continuously monitored during the procedure by the interventional radiology nurse under my direct supervision. FLUOROSCOPY TIME:  30 seconds (7 mGy) COMPLICATIONS: None immediate. CONTRAST:  None. PROCEDURE: Informed consent was obtained from the patient following explanation of the procedure, risks, benefits and alternatives. The patient understands, agrees and consents for the procedure. All questions were addressed. A time out was performed. Maximal barrier sterile technique utilized including caps, mask, sterile gowns, sterile gloves, large sterile drape, hand hygiene, and 2% chlorhexidine scrub. Under sterile conditions and local anesthesia, right internal jugular micropuncture venous access was performed. Access was performed with ultrasound. Images were obtained for documentation. A guide wire was inserted followed by a transitional dilator. This allowed insertion of a guide wire and catheter into the IVC. Measurements were obtained from the SVC / RA junction back to the right IJ venotomy site. In the right infraclavicular chest, a subcutaneous pocket was created over the second anterior rib. This was done under sterile conditions and local anesthesia. 1% lidocaine with epinephrine was utilized for this. A 2.5 cm incision was made in the skin. Blunt dissection was performed to create a subcutaneous pocket over the right pectoralis major  muscle. The pocket was flushed with saline vigorously. There was adequate hemostasis. The port catheter was assembled and checked for leakage. The port catheter was secured in the pocket with  two retention sutures. The tubing was tunneled subcutaneously to the right venotomy site and inserted into the SVC/RA junction through a valved peel-away sheath. Position was confirmed with fluoroscopy. Images were obtained for documentation. The patient tolerated the procedure well. No immediate complications. Incisions were closed in a two layer fashion with 4 - 0 Vicryl suture. Dermabond was applied to the skin. The port catheter was accessed, blood was aspirated followed by saline and heparin flushes. Needle was removed. A dry sterile dressing was applied. IMPRESSION: Ultrasound and fluoroscopically guided right internal jugular single lumen power port catheter insertion. Tip in the SVC/RA junction. Catheter ready for use. Electronically Signed   By: Jerilynn Mages.  Shick M.D.   On: 07/11/2017 15:07   Ir Fluoro Guide Port Insertion Right  Result Date: 07/11/2017 CLINICAL DATA:  Lung cancer, access for chemotherapy EXAM: RIGHT INTERNAL JUGULAR SINGLE LUMEN POWER PORT CATHETER INSERTION Date:  9/12/20189/09/2017 3:02 pm Radiologist:  M. Daryll Brod, MD Guidance:  Ultrasound and fluoroscopic MEDICATIONS: 1 g vancomycin; The antibiotic was administered within an appropriate time interval prior to skin puncture. ANESTHESIA/SEDATION: Versed 2.0 mg IV; Fentanyl 100 mcg IV; Moderate Sedation Time:  28 minutes The patient was continuously monitored during the procedure by the interventional radiology nurse under my direct supervision. FLUOROSCOPY TIME:  30 seconds (7 mGy) COMPLICATIONS: None immediate. CONTRAST:  None. PROCEDURE: Informed consent was obtained from the patient following explanation of the procedure, risks, benefits and alternatives. The patient understands, agrees and consents for the procedure. All questions were addressed. A time out was performed. Maximal barrier sterile technique utilized including caps, mask, sterile gowns, sterile gloves, large sterile drape, hand hygiene, and 2% chlorhexidine scrub. Under  sterile conditions and local anesthesia, right internal jugular micropuncture venous access was performed. Access was performed with ultrasound. Images were obtained for documentation. A guide wire was inserted followed by a transitional dilator. This allowed insertion of a guide wire and catheter into the IVC. Measurements were obtained from the SVC / RA junction back to the right IJ venotomy site. In the right infraclavicular chest, a subcutaneous pocket was created over the second anterior rib. This was done under sterile conditions and local anesthesia. 1% lidocaine with epinephrine was utilized for this. A 2.5 cm incision was made in the skin. Blunt dissection was performed to create a subcutaneous pocket over the right pectoralis major muscle. The pocket was flushed with saline vigorously. There was adequate hemostasis. The port catheter was assembled and checked for leakage. The port catheter was secured in the pocket with two retention sutures. The tubing was tunneled subcutaneously to the right venotomy site and inserted into the SVC/RA junction through a valved peel-away sheath. Position was confirmed with fluoroscopy. Images were obtained for documentation. The patient tolerated the procedure well. No immediate complications. Incisions were closed in a two layer fashion with 4 - 0 Vicryl suture. Dermabond was applied to the skin. The port catheter was accessed, blood was aspirated followed by saline and heparin flushes. Needle was removed. A dry sterile dressing was applied. IMPRESSION: Ultrasound and fluoroscopically guided right internal jugular single lumen power port catheter insertion. Tip in the SVC/RA junction. Catheter ready for use. Electronically Signed   By: Jerilynn Mages.  Shick M.D.   On: 07/11/2017 15:07    ASSESSMENT AND PLAN:  This is a very  pleasant 61 years old African-American male recently diagnosed with a stage IIIa non-small cell lung cancer, adenocarcinoma presented with left lower lobe  lung mass in addition to mediastinal lymphadenopathy.  The patient underwent a course of concurrent chemoradiation with weekly carboplatin and paclitaxel status post 7 cycles and tolerated his treatment fairly well. He had repeat CT scan of the chest performed recently. I personally and independently reviewed the scans and discussed the results with the patient and his sister. His scan showed stable disease after the course of concurrent chemoradiation. I discussed with the patient and his treatment options including observation versus proceeding with consolidation immunotherapy with Imfinzi (Durvalumab) 10 MG/KG every 2 weeks for a total of one year unless the patient developed disease progression or intolerable toxicity. The patient is interested in proceeding with the consolidation treatment and he is expected to start the first dose of this treatment on 08/15/2017. I discussed with the patient the adverse effect of this treatment including but not limited to immunotherapy mediated skin rash, diarrhea, or inflammation of the lung, kidney, liver, thyroid or other endocrine dysfunction. For the recently diagnosed pulmonary embolism, the patient will continue on Lovenox subcutaneously as prescribed from the hospital. I would see him back for follow-up visit in 2 weeks for evaluation with the start of the first cycle of his treatment. He was advised to call immediately if he has any concerning symptoms in the interval. The patient voices understanding of current disease status and treatment options and is in agreement with the current care plan. All questions were answered. The patient knows to call the clinic with any problems, questions or concerns. We can certainly see the patient much sooner if necessary.  Disclaimer: This note was dictated with voice recognition software. Similar sounding words can inadvertently be transcribed and may not be corrected upon review.

## 2017-08-02 NOTE — Telephone Encounter (Signed)
S/w sister and they will be coming at 11 am today.

## 2017-08-02 NOTE — Progress Notes (Signed)
DISCONTINUE ON PATHWAY REGIMEN - Non-Small Cell Lung     Administer weekly:     Paclitaxel      Carboplatin   **Always confirm dose/schedule in your pharmacy ordering system**    REASON: Continuation Of Treatment PRIOR TREATMENT: [id number]: Carboplatin AUC=2 + Paclitaxel 45 mg/m2 Weekly During Radiation TREATMENT RESPONSE: Stable Disease (SD)  START ON PATHWAY REGIMEN - Non-Small Cell Lung     A cycle is every 14 days:     Durvalumab   **Always confirm dose/schedule in your pharmacy ordering system**    Patient Characteristics: Stage III - Unresectable, PS = 0, 1 AJCC T Category: T2a Current Disease Status: No Distant Mets or Local Recurrence AJCC N Category: N2 AJCC M Category: M0 AJCC 8 Stage Grouping: IIIA Performance Status: PS = 0, 1 Intent of Therapy: Curative Intent, Discussed with Patient

## 2017-08-02 NOTE — Telephone Encounter (Signed)
Gave avs and calendar for October  °

## 2017-08-03 ENCOUNTER — Encounter: Payer: Self-pay | Admitting: Internal Medicine

## 2017-08-06 ENCOUNTER — Encounter: Payer: Self-pay | Admitting: Pulmonary Disease

## 2017-08-06 ENCOUNTER — Ambulatory Visit (INDEPENDENT_AMBULATORY_CARE_PROVIDER_SITE_OTHER): Payer: Medicare Other | Admitting: Pulmonary Disease

## 2017-08-06 VITALS — BP 114/76 | HR 97 | Ht 67.0 in | Wt 209.0 lb

## 2017-08-06 DIAGNOSIS — J449 Chronic obstructive pulmonary disease, unspecified: Secondary | ICD-10-CM | POA: Diagnosis not present

## 2017-08-06 DIAGNOSIS — J9611 Chronic respiratory failure with hypoxia: Secondary | ICD-10-CM | POA: Diagnosis not present

## 2017-08-06 NOTE — Patient Instructions (Signed)
Will arrange for humidifier set up for your home oxygen equipment  Will arrange for portable oxygen meter to use at home  Follow up in 6 months

## 2017-08-06 NOTE — Progress Notes (Signed)
Current Outpatient Prescriptions on File Prior to Visit  Medication Sig  . aspirin EC 81 MG EC tablet Take 1 tablet (81 mg total) by mouth daily.  Marland Kitchen enoxaparin (LOVENOX) 150 MG/ML injection Inject 0.9 mLs (135 mg total) into the skin daily.  . fluticasone furoate-vilanterol (BREO ELLIPTA) 200-25 MCG/INH AEPB Inhale 1 puff into the lungs daily.  . furosemide (LASIX) 40 MG tablet Take 3 tablets (120 mg total) by mouth daily.  . OXYGEN Inhale 3 L into the lungs continuous.  . prochlorperazine (COMPAZINE) 10 MG tablet Take 1 tablet (10 mg total) by mouth every 6 (six) hours as needed for nausea or vomiting.  . sucralfate (CARAFATE) 1 g tablet Take 1 tablet (1 g total) by mouth 4 (four) times daily -  with meals and at bedtime. 5 min before meals for radiation induced esophagitis  . Wound Cleansers (RADIAPLEX EX) Apply 1 application topically at bedtime.  . potassium chloride SA (KLOR-CON M20) 20 MEQ tablet Take 1 tablet (20 mEq total) by mouth daily.   No current facility-administered medications on file prior to visit.      Chief Complaint  Patient presents with  . Follow-up    Pt had CT last Monday. Pt doesn't use POC all the time when leaving the home. When pt walked from front lobby to exam room, O2 was 83% at 3 liters pulse. Pt now with 3 liters con't back to 97%    Pulmonary tests CT chest 02/21/17 >> superior segment LLL 3.9 cm mass, 1.3 cm Lt paratracheal LAN PET scan 03/09/17 >> 3.7 cm LLL mass 14.9 SUV, 12 mm AP LAN 5.2 SUV, 2.7 cm density pancreatic head 7.4 SUV PFT 03/29/17 >> FEV1 0.89 (39%), FEV1% 54, TLC 4.42 (80%), DLCO 39%, +BD CT angio chest 07/30/17 >> saddle PE, similar appearance of lung mass LLL, centrilobular and paraseptal emphysema  Cardiac tests Echo 08/01/17 >> EF 65 to 70%, grade 1 DD Doppler legs 08/01/17 >> DVT Rt popliteal vein  Past medical history  Past surgical history, Family history, Social history, Allergies all reviewed.  Vital Signs BP 114/76 (BP  Location: Left Arm, Cuff Size: Normal)   Pulse 97   Ht 5\' 7"  (1.702 m)   Wt 209 lb (94.8 kg)   SpO2 97%   BMI 32.73 kg/m   History of Present Illness James Zamora is a 61 y.o. male former smoker with COPD.  He as in hospital for acute PE.  He was started on lovenox.  He isn't using his oxygen consistently.  He gets nose bleeds.  He is not having cough, wheeze, or sputum.  Denies chest pain or leg swelling.  He is not very active.  Physical Exam  General - pleasant, wearing oxygen Eyes - pupils reactive ENT - no sinus tenderness, no oral exudate, no LAN Cardiac - regular, no murmur Chest - no wheeze, rales Abd - soft, non tender Ext - no edema Skin - no rashes Neuro - normal strength Psych - normal mood   Assessment/Plan  COPD with emphysema. - he can use breo as needed  Chronic respiratory failure with hypoxia. - had extensive discussion with him about the role of supplemental oxygen - will arrange for humidifier for his oxygen - will arrange for portable pulse oximeter - advised goal SpO2 > 90% - continue 3 to 5 liters oxygen - I would suspect his O2 needs to improve as his PE resolves  Acute PE with Rt leg DVT. - he is on  lovenox - he is to f/u with Dr. Julien Nordmann  Stage IIIA (T2, N2, M0) NSCLC - adenocarcinoma. - followed by Dr. Julien Nordmann - completed chemoradiation with carboplatin and paclitaxel 07/03/17 - to start durvalumab later this month   Patient Instructions  Will arrange for humidifier set up for your home oxygen equipment  Will arrange for portable oxygen meter to use at home  Follow up in 6 months    Chesley Mires, MD [facility] Pager:  [phone] 08/06/2017, 3:02 PM

## 2017-08-08 ENCOUNTER — Ambulatory Visit
Admission: RE | Admit: 2017-08-08 | Discharge: 2017-08-08 | Disposition: A | Discharge status: Home / Self Care | Payer: Medicare Other | Source: Ambulatory Visit | Attending: Urology | Admitting: Urology

## 2017-08-08 ENCOUNTER — Encounter: Payer: Self-pay | Admitting: Urology

## 2017-08-08 VITALS — BP 111/75 | HR 89 | Temp 97.5°F | Resp 18 | Ht 67.0 in | Wt 213.8 lb

## 2017-08-08 DIAGNOSIS — C3432 Malignant neoplasm of lower lobe, left bronchus or lung: Secondary | ICD-10-CM | POA: Diagnosis not present

## 2017-08-08 NOTE — Progress Notes (Signed)
Radiation Oncology         [phone]) [phone] ________________________________  Name: James Zamora MRN: [id number]  Date of Service: 08/08/2017 DOB: Feb 18, 1956  Post Treatment Note  CC: [name], MD  [name], MD  Diagnosis:   61 y.o. male with Stage IIINSCLC, adenocarcinoma of the left lower lung.   Interval Since Last Radiation:  4.5 weeks; Concurrent chemoradiation   05/21/2017 to 07/06/2017: The primary tumor in LLL and involved mediastinal adenopathy were treated to 66 Gy in 33 fractions of 2 Gy.  Narrative:  The patient returns today for routine follow-up.  He tolerated radiation treatment relatively well.  He experienced modest fatigue as well as mild hyperpigmentation without desquamation of the left upper back and mild dysphagia near the end of treatment. He denied cough, chest pain or increased shortness of breath.   He tolerated his previous course of concurrent chemoradiation fairly well. He had a scheduled follow up post-treatment CT scan of the chest on 07/30/17 which showed evidence for pulmonary embolism and he was therefore admitted to [facility] for treatment with intravenous heparin and discharged home 08/01/17 on subcutaneous Lovenox. At the time of admission, he denied chest pain or increased shortness of breath but admitted that at least twice in the previous 2 days he had felt near syncope with exertion otherwise he was asymptomatic. CT Chest showed large saddle embolus extending into the pulmonary arterial tree of both lungs, with evidence of right-sided heart stream (RV/LV ratio=1.34) consistent with at least submassive (intermediate risk) PE. Also seen is a 3.5 x 3.3 x 3.7 cm left lower lobe mass which appears very similar to prior examinations. Mildly enlarged low left paratracheal lymph node suspicious for a metastatic lesion. There is new subtle inflammatory changes adjacent to the body and tail of the pancreas concerning for an acute pancreatitis.    He was seen in follow up with Dr. Julien Nordmann on 08/02/17 and is scheduled to begin consolidation treatment with immunotherapy, Imfinzi (Durvalumab) 10 MG/KG every 2 weeks, first dose 08/15/17.                             On review of systems, the patient states he is doing well in general.  He denies chest pain, dysphagia, cough, hemoptysis, wheezing, dyspnea, palpitations, diaphoresis or pitting edema of the lower extremities.  He continues on his chronic oxygen supplementation at 3 L via nasal cannula. He denies abdominal pain, nausea, vomiting or diarrhea. He reports a healthy appetite and is maintaining his weight. He has noticed a gradual return of his energy level since completion of chemoradiation. He has continued on subcutaneous Lovenox as prescribed for anticoagulation status post recent PE.  ALLERGIES:  is allergic to bee venom and penicillins.  Meds: Current Outpatient Prescriptions  Medication Sig Dispense Refill  . enoxaparin (LOVENOX) 150 MG/ML injection Inject 0.9 mLs (135 mg total) into the skin daily. 30 Syringe 1  . furosemide (LASIX) 40 MG tablet Take 3 tablets (120 mg total) by mouth daily. 90 tablet 6  . OXYGEN Inhale 3 L into the lungs continuous.    Marland Kitchen aspirin EC 81 MG EC tablet Take 1 tablet (81 mg total) by mouth daily. (Patient not taking: Reported on 08/08/2017) 30 tablet 1  . fluticasone furoate-vilanterol (BREO ELLIPTA) 200-25 MCG/INH AEPB Inhale 1 puff into the lungs daily. (Patient not taking: Reported on 08/08/2017) 1 each 5  . potassium chloride SA (KLOR-CON M20) 20  MEQ tablet Take 1 tablet (20 mEq total) by mouth daily. 30 tablet 6  . prochlorperazine (COMPAZINE) 10 MG tablet Take 1 tablet (10 mg total) by mouth every 6 (six) hours as needed for nausea or vomiting. (Patient not taking: Reported on 08/08/2017) 30 tablet 0  . sucralfate (CARAFATE) 1 g tablet Take 1 tablet (1 g total) by mouth 4 (four) times daily -  with meals and at bedtime. 5 min before meals for  radiation induced esophagitis (Patient not taking: Reported on 08/08/2017) 120 tablet 2   No current facility-administered medications for this encounter.     Physical Findings:  height is 5\' 7"  (1.702 m) and weight is 213 lb 12.8 oz (97 kg). His oral temperature is 97.5 F (36.4 C) (abnormal). His blood pressure is 111/75 and his pulse is 89. His respiration is 18 and oxygen saturation is 94%.  Pain Assessment Pain Score: 0-No pain/10 In general this is a well appearing African-American male in no acute distress. He's alert and oriented x4 and appropriate throughout the examination. Cardiopulmonary assessment is negative for acute distress and he exhibits normal effort.   Lab Findings: Lab Results  Component Value Date   WBC 5.4 08/01/2017   HGB 11.7 (L) 08/01/2017   HCT 36.4 (L) 08/01/2017   MCV 82.0 08/01/2017   PLT 158 08/01/2017     Radiographic Findings: Ct Chest W Contrast  Result Date: 07/30/2017 CLINICAL DATA:  61 year old male with history of left-sided lung cancer status post chemotherapy and radiation therapy now complete. On home oxygen. EXAM: CT CHEST WITH CONTRAST TECHNIQUE: Multidetector CT imaging of the chest was performed during intravenous contrast administration. CONTRAST:  29mL ISOVUE-300 IOPAMIDOL (ISOVUE-300) INJECTION 61% COMPARISON:  Chest CT 02/21/2017.  PET-CT 03/09/2017. FINDINGS: Cardiovascular: Large filling defect which extends across the the bifurcation of the pulmonic trunk extending into lobar, segmental and subsegmental sized pulmonary artery is bilaterally, indicative of a saddle embolus. Right ventricle appears dilated (4.7 cm) as compared with the (3.5 cm), within RV to LV ratio of 1.34. There is no significant pericardial fluid, thickening or pericardial calcification. Aortic atherosclerosis. No definite coronary artery calcifications are identified. Right internal jugular single-lumen porta cath with tip terminating in the right atrium.  Mediastinum/Nodes: Mildly enlarged low left paratracheal lymph node measuring 11 mm in short axis. No other definite mediastinal or hilar lymphadenopathy. Esophagus is unremarkable in appearance. No axillary lymphadenopathy. Lungs/Pleura: Previously noted left lower lobe mass measures 3.5 x 3.3 x 3.7 cm (axial image 69 of series 7 and sagittal image 108 of series 6). This mass again demonstrates macrolobulated slightly spiculated margins with extends posteriorly to the overlying pleura which is mildly thickened. Slight haziness and septal thickening now surround the lesion, which could be reflection of very early postradiation changes. Subpleural satellite nodule measuring 6 mm in the superior segment of the left lower lobe abutting the major fissure (axial image 66 of series 7) is noted, but nonspecific. No other definite suspicious appearing pulmonary nodules or masses are noted. No acute consolidative airspace disease. No pleural effusions. Mild diffuse bronchial wall thickening with mild centrilobular and paraseptal emphysema. Upper Abdomen: Mild soft tissue stranding adjacent to the pancreas is new compared to the prior examination, and nonspecific, but can be seen in the setting of an acute pancreatitis. Notably, of the head of the pancreas is incompletely visualized (previously there was a lesion noted in the pancreatic head). Status post cholecystectomy. Chronic nodular thickening of the left adrenal gland with some faint calcifications,  similar to prior examinations dating back to 2011, presumably benign. Musculoskeletal: Chronic irregular lucent lesion in the right side of the manubrium, similar to prior studies dating back to 2011, presumably benign. There are no aggressive appearing lytic or blastic lesions noted in the visualized portions of the skeleton. IMPRESSION: 1. Large saddle embolus extending into the pulmonary arterial tree of both lungs, with evidence of right-sided heart strain (RV/LV Ratio =  1.34) consistent with at least submassive (intermediate risk) PE. The presence of right heart strain has been associated with an increased risk of morbidity and mortality. Please activate Code PE by paging [phone]. Critical Value/emergent results were called by telephone at the time of interpretation on 07/30/2017 at 3:32 pm to Dr. Curt Bears, who verbally acknowledged these results. 2. 3.5 x 3.3 x 3.7 cm left lower lobe mass appears very similar to prior examinations. Mildly enlarged low left paratracheal lymph node is suspicious for a metastatic lesion. 3. New subtle inflammatory changes adjacent to the body and tail of the pancreas concerning for an acute pancreatitis. Clinical correlation and correlation with lipase levels is recommended. Notably, the pancreatic head was not visualized on today's examination, but a hypermetabolic lesion was noted in the pancreatic head on prior PET-CT. The possibility of ductal obstruction should be considered. 4. Aortic atherosclerosis. 5. Mild diffuse bronchial wall thickening with mild centrilobular and paraseptal emphysema; imaging findings suggestive of underlying COPD. Aortic Atherosclerosis (ICD10-I70.0) and Emphysema (ICD10-J43.9). Electronically Signed   By: Vinnie Langton M.D.   On: 07/30/2017 15:43   US Venous Img Lower Bilateral  Result Date: 08/01/2017 CLINICAL DATA:  Bilateral lower extremity edema, pulmonary embolus EXAM: BILATERAL LOWER EXTREMITY VENOUS DOPPLER ULTRASOUND TECHNIQUE: Gray-scale sonography with graded compression, as well as color Doppler and duplex ultrasound were performed to evaluate the lower extremity deep venous systems from the level of the common femoral vein and including the common femoral, femoral, profunda femoral, popliteal and calf veins including the posterior tibial, peroneal and gastrocnemius veins when visible. The superficial great saphenous vein was also interrogated. Spectral Doppler was utilized to evaluate flow  at rest and with distal augmentation maneuvers in the common femoral, femoral and popliteal veins. COMPARISON:  None. FINDINGS: RIGHT LOWER EXTREMITY Common Femoral Vein: No evidence of thrombus. Normal compressibility, respiratory phasicity and response to augmentation. Saphenofemoral Junction: No evidence of thrombus. Normal compressibility and flow on color Doppler imaging. Profunda Femoral Vein: No evidence of thrombus. Normal compressibility and flow on color Doppler imaging. Femoral Vein: No evidence of thrombus. Normal compressibility, respiratory phasicity and response to augmentation. Popliteal Vein: Expansile thrombus within the right popliteal vein, subocclusive proximally. Calf Veins: No evidence of thrombus. Normal compressibility and flow on color Doppler imaging. Superficial Great Saphenous Vein: No evidence of thrombus. Normal compressibility and flow on color Doppler imaging. Venous Reflux:  None. Other Findings:  None. LEFT LOWER EXTREMITY Common Femoral Vein: No evidence of thrombus. Normal compressibility, respiratory phasicity and response to augmentation. Saphenofemoral Junction: No evidence of thrombus. Normal compressibility and flow on color Doppler imaging. Profunda Femoral Vein: No evidence of thrombus. Normal compressibility and flow on color Doppler imaging. Femoral Vein: No evidence of thrombus. Normal compressibility, respiratory phasicity and response to augmentation. Popliteal Vein: No evidence of thrombus. Normal compressibility, respiratory phasicity and response to augmentation. Calf Veins: No evidence of thrombus. Normal compressibility and flow on color Doppler imaging. Superficial Great Saphenous Vein: No evidence of thrombus. Normal compressibility and flow on color Doppler imaging. Venous Reflux:  None. Other Findings:  None. IMPRESSION: Deep venous thrombosis within the right popliteal vein. No evidence of deep venous thrombosis in the left lower extremity. Electronically  Signed   By: Julian Hy M.D.   On: 08/01/2017 14:56   Ir US Guide Vasc Access Right  Result Date: 07/11/2017 CLINICAL DATA:  Lung cancer, access for chemotherapy EXAM: RIGHT INTERNAL JUGULAR SINGLE LUMEN POWER PORT CATHETER INSERTION Date:  9/12/20189/09/2017 3:02 pm Radiologist:  M. Daryll Brod, MD Guidance:  Ultrasound and fluoroscopic MEDICATIONS: 1 g vancomycin; The antibiotic was administered within an appropriate time interval prior to skin puncture. ANESTHESIA/SEDATION: Versed 2.0 mg IV; Fentanyl 100 mcg IV; Moderate Sedation Time:  28 minutes The patient was continuously monitored during the procedure by the interventional radiology nurse under my direct supervision. FLUOROSCOPY TIME:  30 seconds (7 mGy) COMPLICATIONS: None immediate. CONTRAST:  None. PROCEDURE: Informed consent was obtained from the patient following explanation of the procedure, risks, benefits and alternatives. The patient understands, agrees and consents for the procedure. All questions were addressed. A time out was performed. Maximal barrier sterile technique utilized including caps, mask, sterile gowns, sterile gloves, large sterile drape, hand hygiene, and 2% chlorhexidine scrub. Under sterile conditions and local anesthesia, right internal jugular micropuncture venous access was performed. Access was performed with ultrasound. Images were obtained for documentation. A guide wire was inserted followed by a transitional dilator. This allowed insertion of a guide wire and catheter into the IVC. Measurements were obtained from the SVC / RA junction back to the right IJ venotomy site. In the right infraclavicular chest, a subcutaneous pocket was created over the second anterior rib. This was done under sterile conditions and local anesthesia. 1% lidocaine with epinephrine was utilized for this. A 2.5 cm incision was made in the skin. Blunt dissection was performed to create a subcutaneous pocket over the right pectoralis  major muscle. The pocket was flushed with saline vigorously. There was adequate hemostasis. The port catheter was assembled and checked for leakage. The port catheter was secured in the pocket with two retention sutures. The tubing was tunneled subcutaneously to the right venotomy site and inserted into the SVC/RA junction through a valved peel-away sheath. Position was confirmed with fluoroscopy. Images were obtained for documentation. The patient tolerated the procedure well. No immediate complications. Incisions were closed in a two layer fashion with 4 - 0 Vicryl suture. Dermabond was applied to the skin. The port catheter was accessed, blood was aspirated followed by saline and heparin flushes. Needle was removed. A dry sterile dressing was applied. IMPRESSION: Ultrasound and fluoroscopically guided right internal jugular single lumen power port catheter insertion. Tip in the SVC/RA junction. Catheter ready for use. Electronically Signed   By: Jerilynn Mages.  Shick M.D.   On: 07/11/2017 15:07   Ir Fluoro Guide Port Insertion Right  Result Date: 07/11/2017 CLINICAL DATA:  Lung cancer, access for chemotherapy EXAM: RIGHT INTERNAL JUGULAR SINGLE LUMEN POWER PORT CATHETER INSERTION Date:  9/12/20189/09/2017 3:02 pm Radiologist:  M. Daryll Brod, MD Guidance:  Ultrasound and fluoroscopic MEDICATIONS: 1 g vancomycin; The antibiotic was administered within an appropriate time interval prior to skin puncture. ANESTHESIA/SEDATION: Versed 2.0 mg IV; Fentanyl 100 mcg IV; Moderate Sedation Time:  28 minutes The patient was continuously monitored during the procedure by the interventional radiology nurse under my direct supervision. FLUOROSCOPY TIME:  30 seconds (7 mGy) COMPLICATIONS: None immediate. CONTRAST:  None. PROCEDURE: Informed consent was obtained from the patient following explanation of the procedure, risks, benefits and alternatives. The patient understands, agrees  and consents for the procedure. All questions were  addressed. A time out was performed. Maximal barrier sterile technique utilized including caps, mask, sterile gowns, sterile gloves, large sterile drape, hand hygiene, and 2% chlorhexidine scrub. Under sterile conditions and local anesthesia, right internal jugular micropuncture venous access was performed. Access was performed with ultrasound. Images were obtained for documentation. A guide wire was inserted followed by a transitional dilator. This allowed insertion of a guide wire and catheter into the IVC. Measurements were obtained from the SVC / RA junction back to the right IJ venotomy site. In the right infraclavicular chest, a subcutaneous pocket was created over the second anterior rib. This was done under sterile conditions and local anesthesia. 1% lidocaine with epinephrine was utilized for this. A 2.5 cm incision was made in the skin. Blunt dissection was performed to create a subcutaneous pocket over the right pectoralis major muscle. The pocket was flushed with saline vigorously. There was adequate hemostasis. The port catheter was assembled and checked for leakage. The port catheter was secured in the pocket with two retention sutures. The tubing was tunneled subcutaneously to the right venotomy site and inserted into the SVC/RA junction through a valved peel-away sheath. Position was confirmed with fluoroscopy. Images were obtained for documentation. The patient tolerated the procedure well. No immediate complications. Incisions were closed in a two layer fashion with 4 - 0 Vicryl suture. Dermabond was applied to the skin. The port catheter was accessed, blood was aspirated followed by saline and heparin flushes. Needle was removed. A dry sterile dressing was applied. IMPRESSION: Ultrasound and fluoroscopically guided right internal jugular single lumen power port catheter insertion. Tip in the SVC/RA junction. Catheter ready for use. Electronically Signed   By: Jerilynn Mages.  Shick M.D.   On: 07/11/2017 15:07      Impression/Plan: 1. 61 y.o. male with Stage IIINSCLC, adenocarcinoma of the left lower lung.  He appears to be recovering well from the effects of concurrent chemoradiation.  He is scheduled for follow up with Dr. Julien Nordmann on 08/15/17 with plans to begin consolidation treatment with immunotherapy, Imfinzi (Durvalumab) 10 MG/KG every 2 weeks, first dose 08/15/17.  I advised him that while we are happy to continue to participate in his treatment if clinically indicated, at this point, we will plan to see him on an as-needed basis. He will continue in routine follow-up with Dr. Julien Nordmann for disease management and surveillance.  He knows to contact us at any time with questions or concerns related to his previous radiotherapy.     Nicholos Johns, PA-C  This document serves as a record of services personally performed by Tyler Pita, MD and Freeman Caldron, PA-C. It was created on their behalf by Arlyce Harman, a trained medical scribe. The creation of this record is based on the scribe's personal observations and the provider's statements to them. This document has been checked and approved by the attending provider.

## 2017-08-10 ENCOUNTER — Ambulatory Visit: Payer: Medicare Other | Admitting: Pulmonary Disease

## 2017-08-15 ENCOUNTER — Other Ambulatory Visit (HOSPITAL_BASED_OUTPATIENT_CLINIC_OR_DEPARTMENT_OTHER): Payer: Medicare Other

## 2017-08-15 ENCOUNTER — Encounter: Payer: Self-pay | Admitting: Internal Medicine

## 2017-08-15 ENCOUNTER — Ambulatory Visit (HOSPITAL_BASED_OUTPATIENT_CLINIC_OR_DEPARTMENT_OTHER): Payer: Medicare Other

## 2017-08-15 ENCOUNTER — Ambulatory Visit (HOSPITAL_BASED_OUTPATIENT_CLINIC_OR_DEPARTMENT_OTHER): Payer: Medicare Other | Admitting: Internal Medicine

## 2017-08-15 ENCOUNTER — Other Ambulatory Visit: Payer: Self-pay | Admitting: Medical Oncology

## 2017-08-15 ENCOUNTER — Telehealth: Payer: Self-pay | Admitting: Internal Medicine

## 2017-08-15 VITALS — BP 131/88 | HR 106 | Temp 98.1°F | Resp 18 | Ht 67.0 in | Wt 222.9 lb

## 2017-08-15 DIAGNOSIS — I2699 Other pulmonary embolism without acute cor pulmonale: Secondary | ICD-10-CM | POA: Diagnosis not present

## 2017-08-15 DIAGNOSIS — R5382 Chronic fatigue, unspecified: Secondary | ICD-10-CM

## 2017-08-15 DIAGNOSIS — Z79899 Other long term (current) drug therapy: Secondary | ICD-10-CM

## 2017-08-15 DIAGNOSIS — C3432 Malignant neoplasm of lower lobe, left bronchus or lung: Secondary | ICD-10-CM

## 2017-08-15 DIAGNOSIS — I878 Other specified disorders of veins: Secondary | ICD-10-CM

## 2017-08-15 DIAGNOSIS — Z7901 Long term (current) use of anticoagulants: Secondary | ICD-10-CM | POA: Diagnosis not present

## 2017-08-15 DIAGNOSIS — Z5112 Encounter for antineoplastic immunotherapy: Secondary | ICD-10-CM

## 2017-08-15 DIAGNOSIS — C3492 Malignant neoplasm of unspecified part of left bronchus or lung: Secondary | ICD-10-CM

## 2017-08-15 DIAGNOSIS — Z5111 Encounter for antineoplastic chemotherapy: Secondary | ICD-10-CM

## 2017-08-15 LAB — CBC WITH DIFFERENTIAL/PLATELET
BASO%: 0.6 % (ref 0.0–2.0)
Basophils Absolute: 0 10*3/uL (ref 0.0–0.1)
EOS%: 6.4 % (ref 0.0–7.0)
Eosinophils Absolute: 0.3 10*3/uL (ref 0.0–0.5)
HEMATOCRIT: 33.3 % — AB (ref 38.4–49.9)
HEMOGLOBIN: 10.7 g/dL — AB (ref 13.0–17.1)
LYMPH#: 0.6 10*3/uL — AB (ref 0.9–3.3)
LYMPH%: 14.8 % (ref 14.0–49.0)
MCH: 27.2 pg (ref 27.2–33.4)
MCHC: 32.1 g/dL (ref 32.0–36.0)
MCV: 84.7 fL (ref 79.3–98.0)
MONO#: 0.6 10*3/uL (ref 0.1–0.9)
MONO%: 13 % (ref 0.0–14.0)
NEUT#: 2.8 10*3/uL (ref 1.5–6.5)
NEUT%: 65.2 % (ref 39.0–75.0)
Platelets: 177 10*3/uL (ref 140–400)
RBC: 3.93 10*6/uL — ABNORMAL LOW (ref 4.20–5.82)
RDW: 20.8 % — AB (ref 11.0–14.6)
WBC: 4.3 10*3/uL (ref 4.0–10.3)

## 2017-08-15 LAB — COMPREHENSIVE METABOLIC PANEL
ALBUMIN: 3.1 g/dL — AB (ref 3.5–5.0)
ALK PHOS: 78 U/L (ref 40–150)
ALT: 27 U/L (ref 0–55)
AST: 23 U/L (ref 5–34)
Anion Gap: 6 mEq/L (ref 3–11)
BUN: 5.5 mg/dL — AB (ref 7.0–26.0)
CALCIUM: 9.2 mg/dL (ref 8.4–10.4)
CHLORIDE: 103 meq/L (ref 98–109)
CO2: 31 mEq/L — ABNORMAL HIGH (ref 22–29)
CREATININE: 1 mg/dL (ref 0.7–1.3)
EGFR: >60 mL/min/{1.73_m2} (ref >60)
GLUCOSE: 88 mg/dL (ref 70–140)
POTASSIUM: 4.2 meq/L (ref 3.5–5.1)
SODIUM: 140 meq/L (ref 136–145)
Total Bilirubin: 0.37 mg/dL (ref 0.20–1.20)
Total Protein: 7 g/dL (ref 6.4–8.3)

## 2017-08-15 LAB — TSH: TSH: 0.42 m[IU]/L (ref 0.320–4.118)

## 2017-08-15 MED ORDER — SODIUM CHLORIDE 0.9 % IV SOLN
Freq: Once | INTRAVENOUS | Status: AC
  Administered 2017-08-15: 13:00:00 via INTRAVENOUS

## 2017-08-15 MED ORDER — LIDOCAINE-PRILOCAINE 2.5-2.5 % EX CREA: 1.0000 "application " | TOPICAL_CREAM | CUTANEOUS | 0 refills | Status: DC | PRN

## 2017-08-15 MED ORDER — SODIUM CHLORIDE 0.9% FLUSH
10.0000 mL | INTRAVENOUS | Status: DC | PRN
  Administered 2017-08-15: 10 mL
  Filled 2017-08-15: qty 10

## 2017-08-15 MED ORDER — SODIUM CHLORIDE 0.9 % IV SOLN
1000.0000 mg | Freq: Once | INTRAVENOUS | Status: AC
  Administered 2017-08-15: 1000 mg via INTRAVENOUS
  Filled 2017-08-15: qty 20

## 2017-08-15 MED ORDER — HEPARIN SOD (PORK) LOCK FLUSH 100 UNIT/ML IV SOLN
500.0000 [IU] | Freq: Once | INTRAVENOUS | Status: AC | PRN
  Administered 2017-08-15: 500 [IU]
  Filled 2017-08-15: qty 5

## 2017-08-15 NOTE — Patient Instructions (Signed)
Durvalumab injection  What is this medicine?  DURVALUMAB (dur VAL ue mab) is a monoclonal antibody. It is used to treat urothelial cancer.  This medicine may be used for other purposes; ask your health care provider or pharmacist if you have questions.  COMMON BRAND NAME(S): IMFINZI  What should I tell my health care provider before I take this medicine?  They need to know if you have any of these conditions:  -diabetes  -immune system problems  -infection  -inflammatory bowel disease  -kidney disease  -liver disease  -lung or breathing disease  -lupus  -organ transplant  -stomach or intestine problems  -thyroid disease  -an unusual or allergic reaction to durvalumab, other medicines, foods, dyes, or preservatives  -pregnant or trying to get pregnant  -breast-feeding  How should I use this medicine?  This medicine is for infusion into a vein. It is given by a health care professional in a hospital or clinic setting.  A special MedGuide will be given to you before each treatment. Be sure to read this information carefully each time.  Talk to your pediatrician regarding the use of this medicine in children. Special care may be needed.  Overdosage: If you think you have taken too much of this medicine contact a poison control center or emergency room at once.  NOTE: This medicine is only for you. Do not share this medicine with others.  What if I miss a dose?  It is important not to miss your dose. Call your doctor or health care professional if you are unable to keep an appointment.  What may interact with this medicine?  Interactions have not been studied.  This list may not describe all possible interactions. Give your health care provider a list of all the medicines, herbs, non-prescription drugs, or dietary supplements you use. Also tell them if you smoke, drink alcohol, or use illegal drugs. Some items may interact with your medicine.  What should I watch for while using this medicine?  This drug may make you  feel generally unwell. Continue your course of treatment even though you feel ill unless your doctor tells you to stop.  You may need blood work done while you are taking this medicine.  Do not become pregnant while taking this medicine or for 3 months after stopping it. Women should inform their doctor if they wish to become pregnant or think they might be pregnant. There is a potential for serious side effects to an unborn child. Talk to your health care professional or pharmacist for more information. Do not breast-feed an infant while taking this medicine or for 3 months after stopping it.  What side effects may I notice from receiving this medicine?  Side effects that you should report to your doctor or health care professional as soon as possible:  -allergic reactions like skin rash, itching or hives, swelling of the face, lips, or tongue  -black, tarry stools  -bloody or watery diarrhea  -breathing problems  -change in emotions or moods  -change in sex drive  -changes in vision  -chest pain or chest tightness  -chills  -confusion  -cough  -facial flushing  -fever  -headache  -signs and symptoms of high blood sugar such as dizziness; dry mouth; dry skin; fruity breath; nausea; stomach pain; increased hunger or thirst; increased urination  -signs and symptoms of liver injury like dark yellow or brown urine; general ill feeling or flu-like symptoms; light-colored stools; loss of appetite; nausea; right upper belly pain;   unusually weak or tired; yellowing of the eyes or skin  -stomach pain  -trouble passing urine or change in the amount of urine  -weight gain or weight loss  Side effects that usually do not require medical attention (report these to your doctor or health care professional if they continue or are bothersome):  -bone pain  -constipation  -loss of appetite  -muscle pain  -nausea  -swelling of the ankles, feet, hands  -tiredness  This list may not describe all possible side effects. Call your doctor  for medical advice about side effects. You may report side effects to FDA at 1-800-FDA-1088.  Where should I keep my medicine?  This drug is given in a hospital or clinic and will not be stored at home.  NOTE: This sheet is a summary. It may not cover all possible information. If you have questions about this medicine, talk to your doctor, pharmacist, or health care provider.  © 2018 [employer]vier/Gold Standard (2016-05-19 15:50:36)

## 2017-08-15 NOTE — Progress Notes (Signed)
[facility] Telephone:([phone]) [phone]   [phone]  OFFICE PROGRESS NOTE  James Beard, MD [address] [place] [address]  DIAGNOSIS: Stage IIIA (T2a, N2, M0) non-small cell lung cancer, adenocarcinoma presented with left lower lobe lung mass in addition to mediastinal lymphadenopathy diagnosed in June 2018. The patient also has suspicious soft tissue density in the pancreatic head but this has been present for 3 years on previous CT scan and unlikely to be related to [name], last dose was given 07/03/2017.  CURRENT THERAPY: Consolidation treatment with immunotherapy with Imfinzi (Durvalumab) 10 MG/KG every 2 weeks, first dose 08/15/2017.  INTERVAL HISTORY: James Zamora 61 y.o. male returns to the clinic today for follow-up visit accompanied by his sister. The patient is feeling fine today was no specific complaints except for the baseline shortness of breath and he is currently on home oxygen. He denied having any chest pain, cough or hemoptysis. He denied having any fever or chills. He has no nausea, vomiting, diarrhea or constipation. He denied having any weight loss or night sweats. He is here today for evaluation before starting the first cycle of consolidation immunotherapy with Imfinzi (Durvalumab).   MEDICAL HISTORY: Past Medical History:  Diagnosis Date  . Adenocarcinoma of left lung, stage 3 ([place] Zam[place]) 05/10/2017  . Aortic atherosclerosis ([place]) 07/31/2017  . Asthma   . Chronic diastolic CHF (congestive heart failure) ([place])   . Chronic fatigue 08/02/2017  . Lung mass   . Non-small cell lung cancer ([place]) 11/13/2015  . Obesity   . Perforated bowel ([place])     ALLERGIES:  is allergic to bee venom and penicillins.  MEDICATIONS:  Current Outpatient Prescriptions  Medication Sig Dispense  Refill  . aspirin EC 81 MG EC tablet Take 1 tablet (81 mg total) by mouth daily. 30 tablet 1  . enoxaparin (LOVENOX) 150 MG/ML injection Inject 0.9 mLs (135 mg total) into the skin daily. 30 Syringe 1  . fluticasone furoate-vilanterol (BREO ELLIPTA) 200-25 MCG/INH AEPB Inhale 1 puff into the lungs daily. 1 each 5  . furosemide (LASIX) 40 MG tablet Take 3 tablets (120 mg total) by mouth daily. 90 tablet 6  . OXYGEN Inhale 3 L into the lungs continuous.    . potassium chloride SA (KLOR-CON M20) 20 MEQ tablet Take 1 tablet (20 mEq total) by mouth daily. 30 tablet 6  . prochlorperazine (COMPAZINE) 10 MG tablet Take 1 tablet (10 mg total) by mouth every 6 (six) hours as needed for nausea or vomiting. (Patient not taking: Reported on 08/08/2017) 30 tablet 0   No current facility-administered medications for this visit.     SURGICAL HISTORY:  Past Surgical History:  Procedure Laterality Date  . CHOLECYSTECTOMY    . COLONOSCOPY N/A 07/31/2016   Procedure: COLONOSCOPY;  Surgeon: Danie Binder, MD;  Location: [facility];  Service: Endoscopy;  Laterality: N/A;  1:45 pm  . ESOPHAGOGASTRODUODENOSCOPY N/A 07/31/2016   Procedure: ESOPHAGOGASTRODUODENOSCOPY (EGD);  Surgeon: Danie Binder, MD;  Location: [facility];  Service: Endoscopy;  Laterality: N/A;  . EUS N/A 06/07/2017   Procedure: UPPER ENDOSCOPIC ULTRASOUND (EUS) LINEAR;  Surgeon: Milus Banister, MD;  Location: [facility];  Service: Endoscopy;  Laterality: N/A;  . HERNIA REPAIR    . IR FLUORO GUIDE PORT INSERTION RIGHT  07/11/2017  . IR US GUIDE VASC ACCESS RIGHT  07/11/2017    REVIEW OF SYSTEMS:  A comprehensive review of systems was negative except for: Constitutional: positive for fatigue Respiratory: positive for dyspnea on exertion   PHYSICAL EXAMINATION: General appearance: alert, cooperative, fatigued and no distress Head: Normocephalic, without obvious abnormality, atraumatic Neck: no adenopathy, no JVD, supple, symmetrical,  trachea midline and thyroid not enlarged, symmetric, no tenderness/mass/nodules Lymph nodes: Cervical, supraclavicular, and axillary nodes normal. Resp: clear to auscultation bilaterally Back: symmetric, no curvature. ROM normal. No CVA tenderness. Cardio: regular rate and rhythm, S1, S2 normal, no murmur, click, rub or gallop GI: soft, non-tender; bowel sounds normal; no masses,  no organomegaly Extremities: extremities normal, atraumatic, no cyanosis or edema  ECOG PERFORMANCE STATUS: 1 - Symptomatic but completely ambulatory  Blood pressure 131/88, pulse (!) 106, temperature 98.1 F (36.7 C), temperature source Oral, resp. rate 18, height 5\' 7"  (1.702 m), weight 222 lb 14.4 oz (101.1 kg), SpO2 93 %.  LABORATORY DATA: Lab Results  Component Value Date   WBC 4.3 08/15/2017   HGB 10.7 (L) 08/15/2017   HCT 33.3 (L) 08/15/2017   MCV 84.7 08/15/2017   PLT 177 08/15/2017      Chemistry      Component Value Date/Time   NA 140 08/15/2017 1021   K 4.2 08/15/2017 1021   CL 102 08/01/2017 0413   CO2 31 (H) 08/15/2017 1021   BUN 5.5 (L) 08/15/2017 1021   CREATININE 1.0 08/15/2017 1021      Component Value Date/Time   CALCIUM 9.2 08/15/2017 1021   ALKPHOS 78 08/15/2017 1021   AST 23 08/15/2017 1021   ALT 27 08/15/2017 1021   BILITOT 0.37 08/15/2017 1021       RADIOGRAPHIC STUDIES: Ct Chest W Contrast  Result Date: 07/30/2017 CLINICAL DATA:  60 year old male with history of left-sided lung cancer status post chemotherapy and radiation therapy now complete. On home oxygen. EXAM: CT CHEST WITH CONTRAST TECHNIQUE: Multidetector CT imaging of the chest was performed during intravenous contrast administration. CONTRAST:  75mL ISOVUE-300 IOPAMIDOL (ISOVUE-300) INJECTION 61% COMPARISON:  Chest CT 02/21/2017.  PET-CT 03/09/2017. FINDINGS: Cardiovascular: Large filling defect which extends across the the bifurcation of the pulmonic trunk extending into lobar, segmental and subsegmental sized  pulmonary artery is bilaterally, indicative of a saddle embolus. Right ventricle appears dilated (4.7 cm) as compared with the (3.5 cm), within RV to LV ratio of 1.34. There is no significant pericardial fluid, thickening or pericardial calcification. Aortic atherosclerosis. No definite coronary artery calcifications are identified. Right internal jugular single-lumen porta cath with tip terminating in the right atrium. Mediastinum/Nodes: Mildly enlarged low left paratracheal lymph node measuring 11 mm in short axis. No other definite mediastinal or hilar lymphadenopathy. Esophagus is unremarkable in appearance. No axillary lymphadenopathy. Lungs/Pleura: Previously noted left lower lobe mass measures 3.5 x 3.3 x 3.7 cm (axial image 69 of series 7 and sagittal image 108 of series 6). This mass again demonstrates macrolobulated slightly spiculated margins with extends posteriorly to the overlying pleura which is mildly thickened. Slight haziness and septal thickening now surround the lesion, which could be reflection of very early postradiation changes. Subpleural satellite nodule measuring 6 mm in the superior segment of the left lower lobe abutting the major fissure (axial image 66 of series 7) is noted, but nonspecific. No other definite suspicious appearing pulmonary nodules or masses are noted. No acute consolidative airspace disease. No pleural effusions. Mild diffuse bronchial wall thickening with mild centrilobular  and paraseptal emphysema. Upper Abdomen: Mild soft tissue stranding adjacent to the pancreas is new compared to the prior examination, and nonspecific, but can be seen in the setting of an acute pancreatitis. Notably, of the head of the pancreas is incompletely visualized (previously there was a lesion noted in the pancreatic head). Status post cholecystectomy. Chronic nodular thickening of the left adrenal gland with some faint calcifications, similar to prior examinations dating back to 2011,  presumably benign. Musculoskeletal: Chronic irregular lucent lesion in the right side of the manubrium, similar to prior studies dating back to 2011, presumably benign. There are no aggressive appearing lytic or blastic lesions noted in the visualized portions of the skeleton. IMPRESSION: 1. Large saddle embolus extending into the pulmonary arterial tree of both lungs, with evidence of right-sided heart strain (RV/LV Ratio = 1.34) consistent with at least submassive (intermediate risk) PE. The presence of right heart strain has been associated with an increased risk of morbidity and mortality. Please activate Code PE by paging [phone]. Critical Value/emergent results were called by telephone at the time of interpretation on 07/30/2017 at 3:32 pm to Dr. Curt Bears, who verbally acknowledged these results. 2. 3.5 x 3.3 x 3.7 cm left lower lobe mass appears very similar to prior examinations. Mildly enlarged low left paratracheal lymph node is suspicious for a metastatic lesion. 3. New subtle inflammatory changes adjacent to the body and tail of the pancreas concerning for an acute pancreatitis. Clinical correlation and correlation with lipase levels is recommended. Notably, the pancreatic head was not visualized on today's examination, but a hypermetabolic lesion was noted in the pancreatic head on prior PET-CT. The possibility of ductal obstruction should be considered. 4. Aortic atherosclerosis. 5. Mild diffuse bronchial wall thickening with mild centrilobular and paraseptal emphysema; imaging findings suggestive of underlying COPD. Aortic Atherosclerosis (ICD10-I70.0) and Emphysema (ICD10-J43.9). Electronically Signed   By: Vinnie Langton M.D.   On: 07/30/2017 15:43   US Venous Img Lower Bilateral  Result Date: 08/01/2017 CLINICAL DATA:  Bilateral lower extremity edema, pulmonary embolus EXAM: BILATERAL LOWER EXTREMITY VENOUS DOPPLER ULTRASOUND TECHNIQUE: Gray-scale sonography with graded  compression, as well as color Doppler and duplex ultrasound were performed to evaluate the lower extremity deep venous systems from the level of the common femoral vein and including the common femoral, femoral, profunda femoral, popliteal and calf veins including the posterior tibial, peroneal and gastrocnemius veins when visible. The superficial great saphenous vein was also interrogated. Spectral Doppler was utilized to evaluate flow at rest and with distal augmentation maneuvers in the common femoral, femoral and popliteal veins. COMPARISON:  None. FINDINGS: RIGHT LOWER EXTREMITY Common Femoral Vein: No evidence of thrombus. Normal compressibility, respiratory phasicity and response to augmentation. Saphenofemoral Junction: No evidence of thrombus. Normal compressibility and flow on color Doppler imaging. Profunda Femoral Vein: No evidence of thrombus. Normal compressibility and flow on color Doppler imaging. Femoral Vein: No evidence of thrombus. Normal compressibility, respiratory phasicity and response to augmentation. Popliteal Vein: Expansile thrombus within the right popliteal vein, subocclusive proximally. Calf Veins: No evidence of thrombus. Normal compressibility and flow on color Doppler imaging. Superficial Great Saphenous Vein: No evidence of thrombus. Normal compressibility and flow on color Doppler imaging. Venous Reflux:  None. Other Findings:  None. LEFT LOWER EXTREMITY Common Femoral Vein: No evidence of thrombus. Normal compressibility, respiratory phasicity and response to augmentation. Saphenofemoral Junction: No evidence of thrombus. Normal compressibility and flow on color Doppler imaging. Profunda Femoral Vein: No evidence of thrombus. Normal compressibility and flow on  color Doppler imaging. Femoral Vein: No evidence of thrombus. Normal compressibility, respiratory phasicity and response to augmentation. Popliteal Vein: No evidence of thrombus. Normal compressibility, respiratory  phasicity and response to augmentation. Calf Veins: No evidence of thrombus. Normal compressibility and flow on color Doppler imaging. Superficial Great Saphenous Vein: No evidence of thrombus. Normal compressibility and flow on color Doppler imaging. Venous Reflux:  None. Other Findings:  None. IMPRESSION: Deep venous thrombosis within the right popliteal vein. No evidence of deep venous thrombosis in the left lower extremity. Electronically Signed   By: Julian Hy M.D.   On: 08/01/2017 14:56    ASSESSMENT AND PLAN:  This is a very pleasant 61 years old African-American male recently diagnosed with a stage IIIa non-small cell lung cancer, adenocarcinoma presented with left lower lobe lung mass in addition to mediastinal lymphadenopathy.  The patient underwent a course of concurrent chemoradiation with weekly carboplatin and paclitaxel status post 7 cycles and tolerated his treatment fairly well. The patient is here today to start the first cycle of consolidation immunotherapy with Imfinzi (Durvalumab). He is feeling fine and I recommended for the patient to proceed with this treatment as planned. I will see him back for follow-up visit in 2 weeks for evaluation with the start of cycle #2. For the recently diagnosed pulmonary embolism, the patient will continue on Lovenox subcutaneously as prescribed from the hospital. He was advised to call immediately if he has any concerning symptoms in the interval. The patient voices understanding of current disease status and treatment options and is in agreement with the current care plan. All questions were answered. The patient knows to call the clinic with any problems, questions or concerns. We can certainly see the patient much sooner if necessary.  Disclaimer: This note was dictated with voice recognition software. Similar sounding words can inadvertently be transcribed and may not be corrected upon review.

## 2017-08-15 NOTE — Telephone Encounter (Signed)
Gave avs and calendar for October and November

## 2017-08-21 ENCOUNTER — Encounter: Payer: Self-pay | Admitting: Internal Medicine

## 2017-08-21 NOTE — Progress Notes (Signed)
Received PA request for Lidocaine-Prilocaine cream.  Called [facility]([place]) to initiate PA. Answered clinical questions.  PA approved [id number] 05/23/17-11/19/17. Approval will be sent via fax as well.

## 2017-08-21 NOTE — Progress Notes (Signed)
Received PA approval for Lidocaine-Prilocaine cream via fax.  Faxed copy to [facility] [place] to fill. Fax received ok per confirmation sheet.

## 2017-08-23 ENCOUNTER — Telehealth: Payer: Self-pay | Admitting: Medical Oncology

## 2017-08-23 NOTE — Telephone Encounter (Signed)
I spoke to his sister and she said he is doing fine, eating and drinking. She said she saw him 3 times this week.

## 2017-08-29 ENCOUNTER — Ambulatory Visit (HOSPITAL_BASED_OUTPATIENT_CLINIC_OR_DEPARTMENT_OTHER): Payer: Medicare Other

## 2017-08-29 ENCOUNTER — Telehealth: Payer: Self-pay | Admitting: Internal Medicine

## 2017-08-29 ENCOUNTER — Ambulatory Visit: Payer: Medicare Other

## 2017-08-29 ENCOUNTER — Encounter: Payer: Self-pay | Admitting: Internal Medicine

## 2017-08-29 ENCOUNTER — Ambulatory Visit (HOSPITAL_BASED_OUTPATIENT_CLINIC_OR_DEPARTMENT_OTHER): Payer: Medicare Other | Admitting: Internal Medicine

## 2017-08-29 ENCOUNTER — Other Ambulatory Visit: Payer: Medicare Other

## 2017-08-29 ENCOUNTER — Other Ambulatory Visit (HOSPITAL_BASED_OUTPATIENT_CLINIC_OR_DEPARTMENT_OTHER): Payer: Medicare Other

## 2017-08-29 ENCOUNTER — Encounter: Payer: Self-pay | Admitting: *Deleted

## 2017-08-29 VITALS — BP 118/63 | HR 117 | Temp 97.5°F | Resp 18 | Ht 67.0 in | Wt 209.1 lb

## 2017-08-29 VITALS — HR 98

## 2017-08-29 DIAGNOSIS — C3492 Malignant neoplasm of unspecified part of left bronchus or lung: Secondary | ICD-10-CM

## 2017-08-29 DIAGNOSIS — C3432 Malignant neoplasm of lower lobe, left bronchus or lung: Secondary | ICD-10-CM

## 2017-08-29 DIAGNOSIS — Z7901 Long term (current) use of anticoagulants: Secondary | ICD-10-CM

## 2017-08-29 DIAGNOSIS — I2699 Other pulmonary embolism without acute cor pulmonale: Secondary | ICD-10-CM | POA: Diagnosis not present

## 2017-08-29 DIAGNOSIS — Z5112 Encounter for antineoplastic immunotherapy: Secondary | ICD-10-CM

## 2017-08-29 LAB — CBC WITH DIFFERENTIAL/PLATELET
BASO%: 0.4 % (ref 0.0–2.0)
BASOS ABS: 0 10*3/uL (ref 0.0–0.1)
EOS ABS: 0.2 10*3/uL (ref 0.0–0.5)
EOS%: 4.3 % (ref 0.0–7.0)
HCT: 34.1 % — ABNORMAL LOW (ref 38.4–49.9)
HEMOGLOBIN: 11 g/dL — AB (ref 13.0–17.1)
LYMPH%: 9.6 % — AB (ref 14.0–49.0)
MCH: 27.6 pg (ref 27.2–33.4)
MCHC: 32.2 g/dL (ref 32.0–36.0)
MCV: 85.7 fL (ref 79.3–98.0)
MONO#: 0.7 10*3/uL (ref 0.1–0.9)
MONO%: 11.6 % (ref 0.0–14.0)
NEUT#: 4.3 10*3/uL (ref 1.5–6.5)
NEUT%: 74.1 % (ref 39.0–75.0)
Platelets: 241 10*3/uL (ref 140–400)
RBC: 3.98 10*6/uL — AB (ref 4.20–5.82)
RDW: 20.5 % — ABNORMAL HIGH (ref 11.0–14.6)
WBC: 5.9 10*3/uL (ref 4.0–10.3)
lymph#: 0.6 10*3/uL — ABNORMAL LOW (ref 0.9–3.3)

## 2017-08-29 LAB — COMPREHENSIVE METABOLIC PANEL
ALBUMIN: 3.1 g/dL — AB (ref 3.5–5.0)
ALT: 12 U/L (ref 0–55)
ANION GAP: 10 meq/L (ref 3–11)
AST: 16 U/L (ref 5–34)
Alkaline Phosphatase: 74 U/L (ref 40–150)
BILIRUBIN TOTAL: 0.64 mg/dL (ref 0.20–1.20)
BUN: 9.4 mg/dL (ref 7.0–26.0)
CALCIUM: 9.3 mg/dL (ref 8.4–10.4)
CO2: 27 mEq/L (ref 22–29)
CREATININE: 1.1 mg/dL (ref 0.7–1.3)
Chloride: 101 mEq/L (ref 98–109)
EGFR: >60 mL/min/{1.73_m2} (ref >60)
Glucose: 111 mg/dl (ref 70–140)
Potassium: 3.9 mEq/L (ref 3.5–5.1)
Sodium: 138 mEq/L (ref 136–145)
TOTAL PROTEIN: 8 g/dL (ref 6.4–8.3)

## 2017-08-29 MED ORDER — HEPARIN SOD (PORK) LOCK FLUSH 100 UNIT/ML IV SOLN
500.0000 [IU] | Freq: Once | INTRAVENOUS | Status: AC | PRN
  Administered 2017-08-29: 500 [IU]
  Filled 2017-08-29: qty 5

## 2017-08-29 MED ORDER — SODIUM CHLORIDE 0.9% FLUSH
10.0000 mL | INTRAVENOUS | Status: DC | PRN
  Administered 2017-08-29: 10 mL
  Filled 2017-08-29: qty 10

## 2017-08-29 MED ORDER — SODIUM CHLORIDE 0.9 % IV SOLN
Freq: Once | INTRAVENOUS | Status: AC
  Administered 2017-08-29: 13:00:00 via INTRAVENOUS

## 2017-08-29 MED ORDER — DURVALUMAB 500 MG/10ML IV SOLN
1000.0000 mg | Freq: Once | INTRAVENOUS | Status: AC
  Administered 2017-08-29: 1000 mg via INTRAVENOUS
  Filled 2017-08-29: qty 20

## 2017-08-29 NOTE — Patient Instructions (Signed)
[facility] Discharge Instructions for Patients Receiving Chemotherapy  Today you received the following chemotherapy agents: Imfinzi.  To help prevent nausea and vomiting after your treatment, we encourage you to take your nausea medication as directed.   If you develop nausea and vomiting that is not controlled by your nausea medication, call the clinic.   BELOW ARE SYMPTOMS THAT SHOULD BE REPORTED IMMEDIATELY:  *FEVER GREATER THAN 100.5 F  *CHILLS WITH OR WITHOUT FEVER  NAUSEA AND VOMITING THAT IS NOT CONTROLLED WITH YOUR NAUSEA MEDICATION  *UNUSUAL SHORTNESS OF BREATH  *UNUSUAL BRUISING OR BLEEDING  TENDERNESS IN MOUTH AND THROAT WITH OR WITHOUT PRESENCE OF ULCERS  *URINARY PROBLEMS  *BOWEL PROBLEMS  UNUSUAL RASH Items with * indicate a potential emergency and should be followed up as soon as possible.  Feel free to call the clinic should you have any questions or concerns. The clinic phone number is [phone].  Please show the CHEMO ALERT CARD at check-in to the Emergency Department and triage nurse.   

## 2017-08-29 NOTE — Telephone Encounter (Signed)
Gave avs and calendar for November  °

## 2017-08-29 NOTE — Progress Notes (Signed)
[facility] [phone]   [phone]  OFFICE PROGRESS NOTE  [name], MD [address] [place] [address]  DIAGNOSIS: Stage IIIA (T2a, N2, M0) non-small cell lung cancer, adenocarcinoma presented with left lower lobe lung mass in addition to mediastinal lymphadenopathy diagnosed in June 2018. The patient also has suspicious soft tissue density in the pancreatic head but this has been present for 3 years on previous CT scan and unlikely to be related to [name], last dose was given 07/03/2017.  CURRENT THERAPY: Consolidation treatment with immunotherapy with Imfinzi (Durvalumab) 10 MG/KG every 2 weeks, first dose 08/15/2017.  Status post 1 cycle.  INTERVAL HISTORY: JOSON SAPP 61 y.o. male returns to the clinic today for follow-up visit accompanied by his sister.  The patient is feeling fine today with no specific complaints except for cold symptoms and chest congestion.  He denied having any recent chest pain but has shortness of breath at baseline increased with exertion and he is currently on oxygen.  He denied having any fever or chills.  He denied having any weight loss or night sweats.  He tolerated the first cycle of his treatment with Imfinzi (Durvalumab) fairly well.  The patient is here today for evaluation before starting cycle #2.   MEDICAL HISTORY: Past Medical History:  Diagnosis Date  . Adenocarcinoma of left lung, stage 3 ([facility]) 05/10/2017  . Aortic atherosclerosis ([facility]) 07/31/2017  . Asthma   . Chronic diastolic CHF (congestive heart failure) ([facility])   . Chronic fatigue 08/02/2017  . Lung mass   . Non-small cell lung cancer ([facility]) 11/13/2015  . Obesity   . Perforated bowel ([facility])     ALLERGIES:  is allergic to bee venom and penicillins.  MEDICATIONS:  Current  Outpatient Prescriptions  Medication Sig Dispense Refill  . aspirin EC 81 MG EC tablet Take 1 tablet (81 mg total) by mouth daily. 30 tablet 1  . enoxaparin (LOVENOX) 150 MG/ML injection Inject 0.9 mLs (135 mg total) into the skin daily. 30 Syringe 1  . fluticasone furoate-vilanterol (BREO ELLIPTA) 200-25 MCG/INH AEPB Inhale 1 puff into the lungs daily. 1 each 5  . furosemide (LASIX) 40 MG tablet Take 3 tablets (120 mg total) by mouth daily. 90 tablet 6  . lidocaine-prilocaine (EMLA) cream Apply 1 application topically as needed. Apply small amount to port site 1-1.5 hours before treatment. Cover with plastic wrap. 30 g 0  . OXYGEN Inhale 3 L into the lungs continuous.    . potassium chloride SA (KLOR-CON M20) 20 MEQ tablet Take 1 tablet (20 mEq total) by mouth daily. 30 tablet 6  . prochlorperazine (COMPAZINE) 10 MG tablet Take 1 tablet (10 mg total) by mouth every 6 (six) hours as needed for nausea or vomiting. (Patient not taking: Reported on 08/08/2017) 30 tablet 0   No current facility-administered medications for this visit.     SURGICAL HISTORY:  Past Surgical History:  Procedure Laterality Date  . CHOLECYSTECTOMY    . COLONOSCOPY N/A 07/31/2016   Procedure: COLONOSCOPY;  Surgeon: Danie Binder, MD;  Location: [facility];  Service: Endoscopy;  Laterality: N/A;  1:45 pm  . ESOPHAGOGASTRODUODENOSCOPY N/A 07/31/2016   Procedure: ESOPHAGOGASTRODUODENOSCOPY (EGD);  Surgeon: Danie Binder, MD;  Location: [facility];  Service: Endoscopy;  Laterality:  N/A;  . EUS N/A 06/07/2017   Procedure: UPPER ENDOSCOPIC ULTRASOUND (EUS) LINEAR;  Surgeon: Milus Banister, MD;  Location: [facility];  Service: Endoscopy;  Laterality: N/A;  . HERNIA REPAIR    . IR FLUORO GUIDE PORT INSERTION RIGHT  07/11/2017  . IR US GUIDE VASC ACCESS RIGHT  07/11/2017    REVIEW OF SYSTEMS:  A comprehensive review of systems was negative except for: Respiratory: positive for dyspnea on exertion   PHYSICAL  EXAMINATION: General appearance: alert, cooperative, fatigued and no distress Head: Normocephalic, without obvious abnormality, atraumatic Neck: no adenopathy, no JVD, supple, symmetrical, trachea midline and thyroid not enlarged, symmetric, no tenderness/mass/nodules Lymph nodes: Cervical, supraclavicular, and axillary nodes normal. Resp: clear to auscultation bilaterally Back: symmetric, no curvature. ROM normal. No CVA tenderness. Cardio: regular rate and rhythm, S1, S2 normal, no murmur, click, rub or gallop GI: soft, non-tender; bowel sounds normal; no masses,  no organomegaly Extremities: extremities normal, atraumatic, no cyanosis or edema  ECOG PERFORMANCE STATUS: 1 - Symptomatic but completely ambulatory  Blood pressure 118/63, pulse (!) 117, temperature (!) 97.5 F (36.4 C), temperature source Oral, resp. rate 18, height 5\' 7"  (1.702 m), weight 209 lb 1.6 oz (94.8 kg).  LABORATORY DATA: Lab Results  Component Value Date   WBC 4.3 08/15/2017   HGB 10.7 (L) 08/15/2017   HCT 33.3 (L) 08/15/2017   MCV 84.7 08/15/2017   PLT 177 08/15/2017      Chemistry      Component Value Date/Time   NA 140 08/15/2017 1021   K 4.2 08/15/2017 1021   CL 102 08/01/2017 0413   CO2 31 (H) 08/15/2017 1021   BUN 5.5 (L) 08/15/2017 1021   CREATININE 1.0 08/15/2017 1021      Component Value Date/Time   CALCIUM 9.2 08/15/2017 1021   ALKPHOS 78 08/15/2017 1021   AST 23 08/15/2017 1021   ALT 27 08/15/2017 1021   BILITOT 0.37 08/15/2017 1021       RADIOGRAPHIC STUDIES: Ct Chest W Contrast  Result Date: 07/30/2017 CLINICAL DATA:  61 year old male with history of left-sided lung cancer status post chemotherapy and radiation therapy now complete. On home oxygen. EXAM: CT CHEST WITH CONTRAST TECHNIQUE: Multidetector CT imaging of the chest was performed during intravenous contrast administration. CONTRAST:  52mL ISOVUE-300 IOPAMIDOL (ISOVUE-300) INJECTION 61% COMPARISON:  Chest CT 02/21/2017.   PET-CT 03/09/2017. FINDINGS: Cardiovascular: Large filling defect which extends across the the bifurcation of the pulmonic trunk extending into lobar, segmental and subsegmental sized pulmonary artery is bilaterally, indicative of a saddle embolus. Right ventricle appears dilated (4.7 cm) as compared with the (3.5 cm), within RV to LV ratio of 1.34. There is no significant pericardial fluid, thickening or pericardial calcification. Aortic atherosclerosis. No definite coronary artery calcifications are identified. Right internal jugular single-lumen porta cath with tip terminating in the right atrium. Mediastinum/Nodes: Mildly enlarged low left paratracheal lymph node measuring 11 mm in short axis. No other definite mediastinal or hilar lymphadenopathy. Esophagus is unremarkable in appearance. No axillary lymphadenopathy. Lungs/Pleura: Previously noted left lower lobe mass measures 3.5 x 3.3 x 3.7 cm (axial image 69 of series 7 and sagittal image 108 of series 6). This mass again demonstrates macrolobulated slightly spiculated margins with extends posteriorly to the overlying pleura which is mildly thickened. Slight haziness and septal thickening now surround the lesion, which could be reflection of very early postradiation changes. Subpleural satellite nodule measuring 6 mm in the superior segment of the left lower lobe abutting the  major fissure (axial image 66 of series 7) is noted, but nonspecific. No other definite suspicious appearing pulmonary nodules or masses are noted. No acute consolidative airspace disease. No pleural effusions. Mild diffuse bronchial wall thickening with mild centrilobular and paraseptal emphysema. Upper Abdomen: Mild soft tissue stranding adjacent to the pancreas is new compared to the prior examination, and nonspecific, but can be seen in the setting of an acute pancreatitis. Notably, of the head of the pancreas is incompletely visualized (previously there was a lesion noted in the  pancreatic head). Status post cholecystectomy. Chronic nodular thickening of the left adrenal gland with some faint calcifications, similar to prior examinations dating back to 2011, presumably benign. Musculoskeletal: Chronic irregular lucent lesion in the right side of the manubrium, similar to prior studies dating back to 2011, presumably benign. There are no aggressive appearing lytic or blastic lesions noted in the visualized portions of the skeleton. IMPRESSION: 1. Large saddle embolus extending into the pulmonary arterial tree of both lungs, with evidence of right-sided heart strain (RV/LV Ratio = 1.34) consistent with at least submassive (intermediate risk) PE. The presence of right heart strain has been associated with an increased risk of morbidity and mortality. Please activate Code PE by paging [phone]. Critical Value/emergent results were called by telephone at the time of interpretation on 07/30/2017 at 3:32 pm to Dr. Curt Bears, who verbally acknowledged these results. 2. 3.5 x 3.3 x 3.7 cm left lower lobe mass appears very similar to prior examinations. Mildly enlarged low left paratracheal lymph node is suspicious for a metastatic lesion. 3. New subtle inflammatory changes adjacent to the body and tail of the pancreas concerning for an acute pancreatitis. Clinical correlation and correlation with lipase levels is recommended. Notably, the pancreatic head was not visualized on today's examination, but a hypermetabolic lesion was noted in the pancreatic head on prior PET-CT. The possibility of ductal obstruction should be considered. 4. Aortic atherosclerosis. 5. Mild diffuse bronchial wall thickening with mild centrilobular and paraseptal emphysema; imaging findings suggestive of underlying COPD. Aortic Atherosclerosis (ICD10-I70.0) and Emphysema (ICD10-J43.9). Electronically Signed   By: Vinnie Langton M.D.   On: 07/30/2017 15:43   US Venous Img Lower Bilateral  Result Date:  08/01/2017 CLINICAL DATA:  Bilateral lower extremity edema, pulmonary embolus EXAM: BILATERAL LOWER EXTREMITY VENOUS DOPPLER ULTRASOUND TECHNIQUE: Gray-scale sonography with graded compression, as well as color Doppler and duplex ultrasound were performed to evaluate the lower extremity deep venous systems from the level of the common femoral vein and including the common femoral, femoral, profunda femoral, popliteal and calf veins including the posterior tibial, peroneal and gastrocnemius veins when visible. The superficial great saphenous vein was also interrogated. Spectral Doppler was utilized to evaluate flow at rest and with distal augmentation maneuvers in the common femoral, femoral and popliteal veins. COMPARISON:  None. FINDINGS: RIGHT LOWER EXTREMITY Common Femoral Vein: No evidence of thrombus. Normal compressibility, respiratory phasicity and response to augmentation. Saphenofemoral Junction: No evidence of thrombus. Normal compressibility and flow on color Doppler imaging. Profunda Femoral Vein: No evidence of thrombus. Normal compressibility and flow on color Doppler imaging. Femoral Vein: No evidence of thrombus. Normal compressibility, respiratory phasicity and response to augmentation. Popliteal Vein: Expansile thrombus within the right popliteal vein, subocclusive proximally. Calf Veins: No evidence of thrombus. Normal compressibility and flow on color Doppler imaging. Superficial Great Saphenous Vein: No evidence of thrombus. Normal compressibility and flow on color Doppler imaging. Venous Reflux:  None. Other Findings:  None. LEFT LOWER EXTREMITY Common Femoral  Vein: No evidence of thrombus. Normal compressibility, respiratory phasicity and response to augmentation. Saphenofemoral Junction: No evidence of thrombus. Normal compressibility and flow on color Doppler imaging. Profunda Femoral Vein: No evidence of thrombus. Normal compressibility and flow on color Doppler imaging. Femoral Vein: No  evidence of thrombus. Normal compressibility, respiratory phasicity and response to augmentation. Popliteal Vein: No evidence of thrombus. Normal compressibility, respiratory phasicity and response to augmentation. Calf Veins: No evidence of thrombus. Normal compressibility and flow on color Doppler imaging. Superficial Great Saphenous Vein: No evidence of thrombus. Normal compressibility and flow on color Doppler imaging. Venous Reflux:  None. Other Findings:  None. IMPRESSION: Deep venous thrombosis within the right popliteal vein. No evidence of deep venous thrombosis in the left lower extremity. Electronically Signed   By: Julian Hy M.D.   On: 08/01/2017 14:56    ASSESSMENT AND PLAN:  This is a very pleasant 61 years old African-American male recently diagnosed with a stage IIIa non-small cell lung cancer, adenocarcinoma presented with left lower lobe lung mass in addition to mediastinal lymphadenopathy.  The patient underwent a course of concurrent chemoradiation with weekly carboplatin and paclitaxel status post 7 cycles and tolerated his treatment fairly well. He is currently undergoing treatment with consolidation immunotherapy with Imfinzi (Durvalumab) status post 1 cycle.  He tolerated the first cycle of this treatment fairly well with no significant adverse effects. I recommended for the patient to proceed with cycle #2 today as a schedule. For the recently diagnosed pulmonary embolism, the patient will continue on Lovenox subcutaneously as prescribed from the hospital. He will come back for follow-up visit in 2 weeks for evaluation before starting cycle #3. The patient was advised to call immediately if he has any concerning symptoms in the interval. The patient voices understanding of current disease status and treatment options and is in agreement with the current care plan. All questions were answered. The patient knows to call the clinic with any problems, questions or concerns.  We can certainly see the patient much sooner if necessary.  Disclaimer: This note was dictated with voice recognition software. Similar sounding words can inadvertently be transcribed and may not be corrected upon review.

## 2017-09-06 ENCOUNTER — Other Ambulatory Visit: Payer: Self-pay | Admitting: *Deleted

## 2017-09-06 DIAGNOSIS — Z006 Encounter for examination for normal comparison and control in clinical research program: Secondary | ICD-10-CM

## 2017-09-12 ENCOUNTER — Other Ambulatory Visit (HOSPITAL_BASED_OUTPATIENT_CLINIC_OR_DEPARTMENT_OTHER): Payer: Medicare Other

## 2017-09-12 ENCOUNTER — Encounter: Payer: Self-pay | Admitting: Internal Medicine

## 2017-09-12 ENCOUNTER — Ambulatory Visit (HOSPITAL_BASED_OUTPATIENT_CLINIC_OR_DEPARTMENT_OTHER): Payer: Medicare Other | Admitting: Internal Medicine

## 2017-09-12 ENCOUNTER — Other Ambulatory Visit: Payer: Medicare Other

## 2017-09-12 ENCOUNTER — Encounter: Payer: Self-pay | Admitting: *Deleted

## 2017-09-12 ENCOUNTER — Ambulatory Visit (HOSPITAL_BASED_OUTPATIENT_CLINIC_OR_DEPARTMENT_OTHER): Payer: Medicare Other

## 2017-09-12 ENCOUNTER — Other Ambulatory Visit: Payer: Self-pay | Admitting: Internal Medicine

## 2017-09-12 ENCOUNTER — Ambulatory Visit: Payer: Medicare Other

## 2017-09-12 VITALS — BP 101/56 | HR 97 | Temp 97.8°F | Resp 18

## 2017-09-12 VITALS — BP 91/38 | HR 126 | Temp 98.4°F | Resp 18 | Ht 67.0 in | Wt 207.0 lb

## 2017-09-12 DIAGNOSIS — Z006 Encounter for examination for normal comparison and control in clinical research program: Secondary | ICD-10-CM

## 2017-09-12 DIAGNOSIS — R5382 Chronic fatigue, unspecified: Secondary | ICD-10-CM

## 2017-09-12 DIAGNOSIS — Z5112 Encounter for antineoplastic immunotherapy: Secondary | ICD-10-CM

## 2017-09-12 DIAGNOSIS — R0602 Shortness of breath: Secondary | ICD-10-CM

## 2017-09-12 DIAGNOSIS — R5383 Other fatigue: Secondary | ICD-10-CM | POA: Diagnosis not present

## 2017-09-12 DIAGNOSIS — Z79899 Other long term (current) drug therapy: Secondary | ICD-10-CM

## 2017-09-12 DIAGNOSIS — C3432 Malignant neoplasm of lower lobe, left bronchus or lung: Secondary | ICD-10-CM | POA: Diagnosis not present

## 2017-09-12 DIAGNOSIS — E860 Dehydration: Secondary | ICD-10-CM

## 2017-09-12 DIAGNOSIS — I959 Hypotension, unspecified: Secondary | ICD-10-CM

## 2017-09-12 DIAGNOSIS — C3492 Malignant neoplasm of unspecified part of left bronchus or lung: Secondary | ICD-10-CM

## 2017-09-12 LAB — CBC WITH DIFFERENTIAL/PLATELET
BASO%: 0.3 % (ref 0.0–2.0)
Basophils Absolute: 0 10*3/uL (ref 0.0–0.1)
EOS ABS: 0.2 10*3/uL (ref 0.0–0.5)
EOS%: 3.3 % (ref 0.0–7.0)
HEMATOCRIT: 33.9 % — AB (ref 38.4–49.9)
HEMOGLOBIN: 10.4 g/dL — AB (ref 13.0–17.1)
LYMPH%: 16.2 % (ref 14.0–49.0)
MCH: 27 pg — ABNORMAL LOW (ref 27.2–33.4)
MCHC: 30.7 g/dL — ABNORMAL LOW (ref 32.0–36.0)
MCV: 88.1 fL (ref 79.3–98.0)
MONO#: 0.4 10*3/uL (ref 0.1–0.9)
MONO%: 6.9 % (ref 0.0–14.0)
NEUT%: 73.3 % (ref 39.0–75.0)
NEUTROS ABS: 4.6 10*3/uL (ref 1.5–6.5)
PLATELETS: 232 10*3/uL (ref 140–400)
RBC: 3.85 10*6/uL — AB (ref 4.20–5.82)
RDW: 17.2 % — ABNORMAL HIGH (ref 11.0–14.6)
WBC: 6.3 10*3/uL (ref 4.0–10.3)
lymph#: 1 10*3/uL (ref 0.9–3.3)

## 2017-09-12 LAB — COMPREHENSIVE METABOLIC PANEL
ALBUMIN: 2.8 g/dL — AB (ref 3.5–5.0)
ALT: 10 U/L (ref 0–55)
AST: 17 U/L (ref 5–34)
Alkaline Phosphatase: 59 U/L (ref 40–150)
Anion Gap: 8 mEq/L (ref 3–11)
BUN: 15 mg/dL (ref 7.0–26.0)
CO2: 29 meq/L (ref 22–29)
Calcium: 9.3 mg/dL (ref 8.4–10.4)
Chloride: 101 mEq/L (ref 98–109)
Creatinine: 1 mg/dL (ref 0.7–1.3)
EGFR: >60 mL/min/{1.73_m2} (ref >60)
GLUCOSE: 101 mg/dL (ref 70–140)
Potassium: 4.2 mEq/L (ref 3.5–5.1)
SODIUM: 137 meq/L (ref 136–145)
Total Bilirubin: 0.49 mg/dL (ref 0.20–1.20)
Total Protein: 8.1 g/dL (ref 6.4–8.3)

## 2017-09-12 LAB — RESEARCH LABS

## 2017-09-12 LAB — TSH

## 2017-09-12 MED ORDER — HEPARIN SOD (PORK) LOCK FLUSH 100 UNIT/ML IV SOLN
500.0000 [IU] | Freq: Once | INTRAVENOUS | Status: AC | PRN
  Administered 2017-09-12: 500 [IU]
  Filled 2017-09-12: qty 5

## 2017-09-12 MED ORDER — SODIUM CHLORIDE 0.9 % IV SOLN
10.4000 mg/kg | Freq: Once | INTRAVENOUS | Status: AC
  Administered 2017-09-12: 1000 mg via INTRAVENOUS
  Filled 2017-09-12: qty 20

## 2017-09-12 MED ORDER — SODIUM CHLORIDE 0.9 % IV SOLN
Freq: Once | INTRAVENOUS | Status: AC
  Administered 2017-09-12: 11:00:00 via INTRAVENOUS

## 2017-09-12 MED ORDER — SODIUM CHLORIDE 0.9% FLUSH
10.0000 mL | INTRAVENOUS | Status: DC | PRN
  Administered 2017-09-12: 10 mL
  Filled 2017-09-12: qty 10

## 2017-09-12 NOTE — Patient Instructions (Signed)
Implanted Port Home Guide An implanted port is a type of central line that is placed under the skin. Central lines are used to provide IV access when treatment or nutrition needs to be given through a person's veins. Implanted ports are used for long-term IV access. An implanted port may be placed because:  You need IV medicine that would be irritating to the small veins in your hands or arms.  You need long-term IV medicines, such as antibiotics.  You need IV nutrition for a long period.  You need frequent blood draws for lab tests.  You need dialysis.  Implanted ports are usually placed in the chest area, but they can also be placed in the upper arm, the abdomen, or the leg. An implanted port has two main parts:  Reservoir. The reservoir is round and will appear as a small, raised area under your skin. The reservoir is the part where a needle is inserted to give medicines or draw blood.  Catheter. The catheter is a thin, flexible tube that extends from the reservoir. The catheter is placed into a large vein. Medicine that is inserted into the reservoir goes into the catheter and then into the vein.  How will I care for my incision site? Do not get the incision site wet. Bathe or shower as directed by your health care provider. How is my port accessed? Special steps must be taken to access the port:  Before the port is accessed, a numbing cream can be placed on the skin. This helps numb the skin over the port site.  Your health care provider uses a sterile technique to access the port. ? Your health care provider must put on a mask and sterile gloves. ? The skin over your port is cleaned carefully with an antiseptic and allowed to dry. ? The port is gently pinched between sterile gloves, and a needle is inserted into the port.  Only "non-coring" port needles should be used to access the port. Once the port is accessed, a blood return should be checked. This helps ensure that the port  is in the vein and is not clogged.  If your port needs to remain accessed for a constant infusion, a clear (transparent) bandage will be placed over the needle site. The bandage and needle will need to be changed every week, or as directed by your health care provider.  Keep the bandage covering the needle clean and dry. Do not get it wet. Follow your health care provider's instructions on how to take a shower or bath while the port is accessed.  If your port does not need to stay accessed, no bandage is needed over the port.  What is flushing? Flushing helps keep the port from getting clogged. Follow your health care provider's instructions on how and when to flush the port. Ports are usually flushed with saline solution or a medicine called heparin. The need for flushing will depend on how the port is used.  If the port is used for intermittent medicines or blood draws, the port will need to be flushed: ? After medicines have been given. ? After blood has been drawn. ? As part of routine maintenance.  If a constant infusion is running, the port may not need to be flushed.  How long will my port stay implanted? The port can stay in for as long as your health care provider thinks it is needed. When it is time for the port to come out, surgery will be   done to remove it. The procedure is similar to the one performed when the port was put in. When should I seek immediate medical care? When you have an implanted port, you should seek immediate medical care if:  You notice a bad smell coming from the incision site.  You have swelling, redness, or drainage at the incision site.  You have more swelling or pain at the port site or the surrounding area.  You have a fever that is not controlled with medicine.  This information is not intended to replace advice given to you by your health care provider. Make sure you discuss any questions you have with your health care provider. Document  Released: 10/16/2005 Document Revised: 03/23/2016 Document Reviewed: 06/23/2013 Elsevier Interactive Patient Education  2017 [employer] Inc[employer]  

## 2017-09-12 NOTE — Patient Instructions (Addendum)
Dehydration, Adult Dehydration is when there is not enough fluid or water in your body. This happens when you lose more fluids than you take in. Dehydration can range from mild to very bad. It should be treated right away to keep it from getting very bad. Symptoms of mild dehydration may include:  Thirst.  Dry lips.  Slightly dry mouth.  Dry, warm skin.  Dizziness. Symptoms of moderate dehydration may include:  Very dry mouth.  Muscle cramps.  Dark pee (urine). Pee may be the color of tea.  Your body making less pee.  Your eyes making fewer tears.  Heartbeat that is uneven or faster than normal (palpitations).  Headache.  Light-headedness, especially when you stand up from sitting.  Fainting (syncope). Symptoms of very bad dehydration may include:  Changes in skin, such as: ? Cold and clammy skin. ? Blotchy (mottled) or pale skin. ? Skin that does not quickly return to normal after being lightly pinched and let go (poor skin turgor).  Changes in body fluids, such as: ? Feeling very thirsty. ? Your eyes making fewer tears. ? Not sweating when body temperature is high, such as in hot weather. ? Your body making very little pee.  Changes in vital signs, such as: ? Weak pulse. ? Pulse that is more than 100 beats a minute when you are sitting still. ? Fast breathing. ? Low blood pressure.  Other changes, such as: ? Sunken eyes. ? Cold hands and feet. ? Confusion. ? Lack of energy (lethargy). ? Trouble waking up from sleep. ? Short-term weight loss. ? Unconsciousness. Follow these instructions at home:  If told by your doctor, drink an ORS: ? Make an ORS by using instructions on the package. ? Start by drinking small amounts, about  cup (120 mL) every 5-10 minutes. ? Slowly drink more until you have had the amount that your doctor said to have.  Drink enough clear fluid to keep your pee clear or pale yellow. If you were told to drink an ORS, finish the ORS  first, then start slowly drinking clear fluids. Drink fluids such as: ? Water. Do not drink only water by itself. Doing that can make the salt (sodium) level in your body get too low (hyponatremia). ? Ice chips. ? Fruit juice that you have added water to (diluted). ? Low-calorie sports drinks.  Avoid: ? Alcohol. ? Drinks that have a lot of sugar. These include high-calorie sports drinks, fruit juice that does not have water added, and soda. ? Caffeine. ? Foods that are greasy or have a lot of fat or sugar.  Take over-the-counter and prescription medicines only as told by your doctor.  Do not take salt tablets. Doing that can make the salt level in your body get too high (hypernatremia).  Eat foods that have minerals (electrolytes). Examples include bananas, oranges, potatoes, tomatoes, and spinach.  Keep all follow-up visits as told by your doctor. This is important. Contact a doctor if:  You have belly (abdominal) pain that: ? Gets worse. ? Stays in one area (localizes).  You have a rash.  You have a stiff neck.  You get angry or annoyed more easily than normal (irritability).  You are more sleepy than normal.  You have a harder time waking up than normal.  You feel: ? Weak. ? Dizzy. ? Very thirsty.  You have peed (urinated) only a small amount of very dark pee during 6-8 hours. Get help right away if:  You have symptoms of   very bad dehydration.  You cannot drink fluids without throwing up (vomiting).  Your symptoms get worse with treatment.  You have a fever.  You have a very bad headache.  You are throwing up or having watery poop (diarrhea) and it: ? Gets worse. ? Does not go away.  You have blood or something green (bile) in your throw-up.  You have blood in your poop (stool). This may cause poop to look black and tarry.  You have not peed in 6-8 hours.  You pass out (faint).  Your heart rate when you are sitting still is more than 100 beats a  minute.  You have trouble breathing. This information is not intended to replace advice given to you by your health care provider. Make sure you discuss any questions you have with your health care provider. Document Released: 08/12/2009 Document Revised: 05/05/2016 Document Reviewed: 12/10/2015 Elsevier Interactive Patient Education  2018 [place] Discharge Instructions for Patients Receiving Chemotherapy  Today you received the following chemotherapy agents: Imfinzi  To help prevent nausea and vomiting after your treatment, we encourage you to take your nausea medication as directed.  If you develop nausea and vomiting that is not controlled by your nausea medication, call the clinic.   BELOW ARE SYMPTOMS THAT SHOULD BE REPORTED IMMEDIATELY:  *FEVER GREATER THAN 100.5 F  *CHILLS WITH OR WITHOUT FEVER  NAUSEA AND VOMITING THAT IS NOT CONTROLLED WITH YOUR NAUSEA MEDICATION  *UNUSUAL SHORTNESS OF BREATH  *UNUSUAL BRUISING OR BLEEDING  TENDERNESS IN MOUTH AND THROAT WITH OR WITHOUT PRESENCE OF ULCERS  *URINARY PROBLEMS  *BOWEL PROBLEMS  UNUSUAL RASH Items with * indicate a potential emergency and should be followed up as soon as possible.  Feel free to call the clinic should you have any questions or concerns. The clinic phone number is [phone].  Please show the [facility] at check-in to the Emergency Department and triage nurse.

## 2017-09-12 NOTE — Progress Notes (Signed)
Pt will have 1 L NS added prior to getting Imfinzi per MD

## 2017-09-12 NOTE — Progress Notes (Signed)
[facility] [phone]   [phone]  OFFICE PROGRESS NOTE  [name], MD [address] [place] [address]  DIAGNOSIS: Stage IIIA (T2a, N2, M0) non-small cell lung cancer, adenocarcinoma presented with left lower lobe lung mass in addition to mediastinal lymphadenopathy diagnosed in June 2018. The patient also has suspicious soft tissue density in the pancreatic head but this has been present for 3 years on previous CT scan and unlikely to be related to [name], last dose was given 07/03/2017.  CURRENT THERAPY: Consolidation treatment with immunotherapy with Imfinzi (Durvalumab) 10 MG/KG every 2 weeks, first dose 08/15/2017.  Status post 2 cycles.  INTERVAL HISTORY: James Zamora 61 y.o. male returns to the clinic today for follow-up visit accompanied by his sister.  The patient is feeling tired and fatigued today.  His blood pressure was low and his oxygen saturation was not registered because his fingers were cold.  He denied having any chest pain but has shortness of breath at baseline increased with exertion.  He is currently on home oxygen.  He denied having any fever or chills.  He has no nausea, vomiting, diarrhea or constipation.  He is here today for evaluation before starting cycle #3 of his treatment with immunotherapy.  MEDICAL HISTORY: Past Medical History:  Diagnosis Date  . Adenocarcinoma of left lung, stage 3 ([facility]) 05/10/2017  . Aortic atherosclerosis ([facility]) 07/31/2017  . Asthma   . Chronic diastolic CHF (congestive heart failure) ([facility])   . Chronic fatigue 08/02/2017  . Lung mass   . Non-small cell lung cancer ([facility]) 11/13/2015  . Obesity   . Perforated bowel ([facility])     ALLERGIES:  is allergic to bee venom and penicillins.  MEDICATIONS:  Current Outpatient  Medications  Medication Sig Dispense Refill  . aspirin EC 81 MG EC tablet Take 1 tablet (81 mg total) by mouth daily. 30 tablet 1  . enoxaparin (LOVENOX) 150 MG/ML injection Inject 0.9 mLs (135 mg total) into the skin daily. 30 Syringe 1  . fluticasone furoate-vilanterol (BREO ELLIPTA) 200-25 MCG/INH AEPB Inhale 1 puff into the lungs daily. 1 each 5  . furosemide (LASIX) 40 MG tablet Take 3 tablets (120 mg total) by mouth daily. 90 tablet 6  . lidocaine-prilocaine (EMLA) cream Apply 1 application topically as needed. Apply small amount to port site 1-1.5 hours before treatment. Cover with plastic wrap. 30 g 0  . OXYGEN Inhale 3 L into the lungs continuous.    . potassium chloride SA (KLOR-CON M20) 20 MEQ tablet Take 1 tablet (20 mEq total) by mouth daily. 30 tablet 6  . prochlorperazine (COMPAZINE) 10 MG tablet Take 1 tablet (10 mg total) by mouth every 6 (six) hours as needed for nausea or vomiting. 30 tablet 0   No current facility-administered medications for this visit.     SURGICAL HISTORY:  Past Surgical History:  Procedure Laterality Date  . CHOLECYSTECTOMY    . HERNIA REPAIR    . IR FLUORO GUIDE PORT INSERTION RIGHT  07/11/2017  . IR US GUIDE VASC ACCESS RIGHT  07/11/2017    REVIEW OF SYSTEMS:  A comprehensive review of systems was negative except for: Constitutional: positive for fatigue Respiratory: positive for dyspnea on exertion   PHYSICAL EXAMINATION: General appearance: alert, cooperative, fatigued and no distress  Head: Normocephalic, without obvious abnormality, atraumatic Neck: no adenopathy, no JVD, supple, symmetrical, trachea midline and thyroid not enlarged, symmetric, no tenderness/mass/nodules Lymph nodes: Cervical, supraclavicular, and axillary nodes normal. Resp: clear to auscultation bilaterally Back: symmetric, no curvature. ROM normal. No CVA tenderness. Cardio: regular rate and rhythm, S1, S2 normal, no murmur, click, rub or gallop GI: soft, non-tender;  bowel sounds normal; no masses,  no organomegaly Extremities: extremities normal, atraumatic, no cyanosis or edema  ECOG PERFORMANCE STATUS: 1 - Symptomatic but completely ambulatory  Blood pressure (!) 91/38, pulse (!) 126, temperature 98.4 F (36.9 C), temperature source Oral, resp. rate 18, height 5\' 7"  (1.702 m), weight 207 lb (93.9 kg).  LABORATORY DATA: Lab Results  Component Value Date   WBC 6.3 09/12/2017   HGB 10.4 (L) 09/12/2017   HCT 33.9 (L) 09/12/2017   MCV 88.1 09/12/2017   PLT 232 09/12/2017      Chemistry      Component Value Date/Time   NA 137 09/12/2017 0909   K 4.2 09/12/2017 0909   CL 102 08/01/2017 0413   CO2 29 09/12/2017 0909   BUN 15.0 09/12/2017 0909   CREATININE 1.0 09/12/2017 0909      Component Value Date/Time   CALCIUM 9.3 09/12/2017 0909   ALKPHOS 59 09/12/2017 0909   AST 17 09/12/2017 0909   ALT 10 09/12/2017 0909   BILITOT 0.49 09/12/2017 0909       RADIOGRAPHIC STUDIES: No results found.  ASSESSMENT AND PLAN:  This is a very pleasant 61 years old African-American male recently diagnosed with a stage IIIa non-small cell lung cancer, adenocarcinoma presented with left lower lobe lung mass in addition to mediastinal lymphadenopathy.  The patient underwent a course of concurrent chemoradiation with weekly carboplatin and paclitaxel status post 7 cycles and tolerated his treatment fairly well. He is currently undergoing treatment with consolidation immunotherapy with Imfinzi (Durvalumab) status post 2 cycles.   He tolerated the second cycle of his treatment fairly well.  I recommended for the patient to proceed with cycle #3 today as a schedule. For the hypertension, I will arrange for the patient to receive 1 L of normal saline in the clinic today. For the shortness of breath we will continue to monitor him closely and the patient will continue with his home oxygen.  If there is any further decline in his condition, will send the patient to  the emergency room for evaluation. The patient will come back for follow-up visit in 3 weeks for evaluation before starting cycle #4. He was advised to call immediately if he has any concerning symptoms in the interval. The patient voices understanding of current disease status and treatment options and is in agreement with the current care plan. All questions were answered. The patient knows to call the clinic with any problems, questions or concerns. We can certainly see the patient much sooner if necessary.  Disclaimer: This note was dictated with voice recognition software. Similar sounding words can inadvertently be transcribed and may not be corrected upon review.

## 2017-09-17 ENCOUNTER — Telehealth: Payer: Self-pay | Admitting: Internal Medicine

## 2017-09-17 NOTE — Telephone Encounter (Signed)
Scheduled appt  Per 11/14 los - patient to get an updated scheduled next visit 11/26 appt scheduled on 11/27 due to MM availability .

## 2017-09-18 DIAGNOSIS — I5032 Chronic diastolic (congestive) heart failure: Secondary | ICD-10-CM | POA: Diagnosis not present

## 2017-09-18 DIAGNOSIS — C3492 Malignant neoplasm of unspecified part of left bronchus or lung: Secondary | ICD-10-CM | POA: Diagnosis not present

## 2017-09-18 DIAGNOSIS — R0902 Hypoxemia: Secondary | ICD-10-CM | POA: Diagnosis not present

## 2017-09-18 DIAGNOSIS — Z9981 Dependence on supplemental oxygen: Secondary | ICD-10-CM | POA: Diagnosis not present

## 2017-09-26 ENCOUNTER — Ambulatory Visit (HOSPITAL_BASED_OUTPATIENT_CLINIC_OR_DEPARTMENT_OTHER): Payer: Medicare Other | Admitting: Internal Medicine

## 2017-09-26 ENCOUNTER — Ambulatory Visit: Payer: Medicare Other

## 2017-09-26 ENCOUNTER — Other Ambulatory Visit (HOSPITAL_BASED_OUTPATIENT_CLINIC_OR_DEPARTMENT_OTHER): Payer: Medicare Other

## 2017-09-26 ENCOUNTER — Encounter: Payer: Self-pay | Admitting: Internal Medicine

## 2017-09-26 ENCOUNTER — Ambulatory Visit (HOSPITAL_BASED_OUTPATIENT_CLINIC_OR_DEPARTMENT_OTHER): Payer: Medicare Other

## 2017-09-26 VITALS — BP 103/57 | HR 113 | Temp 98.1°F | Resp 18 | Ht 67.0 in | Wt 200.7 lb

## 2017-09-26 DIAGNOSIS — I82431 Acute embolism and thrombosis of right popliteal vein: Secondary | ICD-10-CM

## 2017-09-26 DIAGNOSIS — C3432 Malignant neoplasm of lower lobe, left bronchus or lung: Secondary | ICD-10-CM | POA: Diagnosis not present

## 2017-09-26 DIAGNOSIS — Z7901 Long term (current) use of anticoagulants: Secondary | ICD-10-CM | POA: Diagnosis not present

## 2017-09-26 DIAGNOSIS — M7989 Other specified soft tissue disorders: Secondary | ICD-10-CM | POA: Diagnosis not present

## 2017-09-26 DIAGNOSIS — Z5112 Encounter for antineoplastic immunotherapy: Secondary | ICD-10-CM

## 2017-09-26 DIAGNOSIS — I2699 Other pulmonary embolism without acute cor pulmonale: Secondary | ICD-10-CM

## 2017-09-26 DIAGNOSIS — C3492 Malignant neoplasm of unspecified part of left bronchus or lung: Secondary | ICD-10-CM

## 2017-09-26 LAB — CBC WITH DIFFERENTIAL/PLATELET
BASO%: 0.3 % (ref 0.0–2.0)
BASOS ABS: 0 10*3/uL (ref 0.0–0.1)
EOS%: 0.3 % (ref 0.0–7.0)
Eosinophils Absolute: 0 10*3/uL (ref 0.0–0.5)
HEMATOCRIT: 33.9 % — AB (ref 38.4–49.9)
HEMOGLOBIN: 10.7 g/dL — AB (ref 13.0–17.1)
LYMPH#: 0.7 10*3/uL — AB (ref 0.9–3.3)
LYMPH%: 13.5 % — ABNORMAL LOW (ref 14.0–49.0)
MCH: 27 pg — AB (ref 27.2–33.4)
MCHC: 31.7 g/dL — AB (ref 32.0–36.0)
MCV: 85 fL (ref 79.3–98.0)
MONO#: 0.6 10*3/uL (ref 0.1–0.9)
MONO%: 10.7 % (ref 0.0–14.0)
NEUT#: 3.9 10*3/uL (ref 1.5–6.5)
NEUT%: 75.2 % — AB (ref 39.0–75.0)
PLATELETS: 249 10*3/uL (ref 140–400)
RBC: 3.99 10*6/uL — ABNORMAL LOW (ref 4.20–5.82)
RDW: 17.2 % — AB (ref 11.0–14.6)
WBC: 5.1 10*3/uL (ref 4.0–10.3)

## 2017-09-26 LAB — COMPREHENSIVE METABOLIC PANEL
ALT: 11 U/L (ref 0–55)
ANION GAP: 9 meq/L (ref 3–11)
AST: 15 U/L (ref 5–34)
Albumin: 3 g/dL — ABNORMAL LOW (ref 3.5–5.0)
Alkaline Phosphatase: 58 U/L (ref 40–150)
BILIRUBIN TOTAL: 0.44 mg/dL (ref 0.20–1.20)
BUN: 15.3 mg/dL (ref 7.0–26.0)
CHLORIDE: 99 meq/L (ref 98–109)
CO2: 28 meq/L (ref 22–29)
Calcium: 9.5 mg/dL (ref 8.4–10.4)
Creatinine: 1.1 mg/dL (ref 0.7–1.3)
GLUCOSE: 100 mg/dL (ref 70–140)
POTASSIUM: 3.9 meq/L (ref 3.5–5.1)
Sodium: 137 mEq/L (ref 136–145)
Total Protein: 8.3 g/dL (ref 6.4–8.3)

## 2017-09-26 MED ORDER — DURVALUMAB 500 MG/10ML IV SOLN
10.4000 mg/kg | Freq: Once | INTRAVENOUS | Status: AC
  Administered 2017-09-26: 1000 mg via INTRAVENOUS
  Filled 2017-09-26: qty 20

## 2017-09-26 MED ORDER — ONDANSETRON HCL 4 MG/2ML IJ SOLN
8.0000 mg | Freq: Once | INTRAMUSCULAR | Status: AC
  Administered 2017-09-26: 8 mg via INTRAVENOUS

## 2017-09-26 MED ORDER — HEPARIN SOD (PORK) LOCK FLUSH 100 UNIT/ML IV SOLN
500.0000 [IU] | Freq: Once | INTRAVENOUS | Status: AC | PRN
  Administered 2017-09-26: 500 [IU]
  Filled 2017-09-26: qty 5

## 2017-09-26 MED ORDER — SODIUM CHLORIDE 0.9 % IV SOLN
Freq: Once | INTRAVENOUS | Status: AC
  Administered 2017-09-26: 11:00:00 via INTRAVENOUS

## 2017-09-26 MED ORDER — SODIUM CHLORIDE 0.9% FLUSH
10.0000 mL | INTRAVENOUS | Status: DC | PRN
  Administered 2017-09-26: 10 mL
  Filled 2017-09-26: qty 10

## 2017-09-26 MED ORDER — SODIUM CHLORIDE 0.9 % IV SOLN: Freq: Once | INTRAVENOUS | Status: DC

## 2017-09-26 MED ORDER — ONDANSETRON HCL 4 MG/2ML IJ SOLN
INTRAMUSCULAR | Status: AC
  Filled 2017-09-26: qty 4

## 2017-09-26 MED ORDER — SODIUM CHLORIDE 0.9% FLUSH
10.0000 mL | Freq: Once | INTRAVENOUS | Status: AC
  Administered 2017-09-26: 10 mL
  Filled 2017-09-26: qty 10

## 2017-09-26 NOTE — Patient Instructions (Signed)
Durvalumab injection  What is this medicine?  DURVALUMAB (dur VAL ue mab) is a monoclonal antibody. It is used to treat urothelial cancer.  This medicine may be used for other purposes; ask your health care provider or pharmacist if you have questions.  COMMON BRAND NAME(S): IMFINZI  What should I tell my health care provider before I take this medicine?  They need to know if you have any of these conditions:  -diabetes  -immune system problems  -infection  -inflammatory bowel disease  -kidney disease  -liver disease  -lung or breathing disease  -lupus  -organ transplant  -stomach or intestine problems  -thyroid disease  -an unusual or allergic reaction to durvalumab, other medicines, foods, dyes, or preservatives  -pregnant or trying to get pregnant  -breast-feeding  How should I use this medicine?  This medicine is for infusion into a vein. It is given by a health care professional in a hospital or clinic setting.  A special MedGuide will be given to you before each treatment. Be sure to read this information carefully each time.  Talk to your pediatrician regarding the use of this medicine in children. Special care may be needed.  Overdosage: If you think you have taken too much of this medicine contact a poison control center or emergency room at once.  NOTE: This medicine is only for you. Do not share this medicine with others.  What if I miss a dose?  It is important not to miss your dose. Call your doctor or health care professional if you are unable to keep an appointment.  What may interact with this medicine?  Interactions have not been studied.  This list may not describe all possible interactions. Give your health care provider a list of all the medicines, herbs, non-prescription drugs, or dietary supplements you use. Also tell them if you smoke, drink alcohol, or use illegal drugs. Some items may interact with your medicine.  What should I watch for while using this medicine?  This drug may make you  feel generally unwell. Continue your course of treatment even though you feel ill unless your doctor tells you to stop.  You may need blood work done while you are taking this medicine.  Do not become pregnant while taking this medicine or for 3 months after stopping it. Women should inform their doctor if they wish to become pregnant or think they might be pregnant. There is a potential for serious side effects to an unborn child. Talk to your health care professional or pharmacist for more information. Do not breast-feed an infant while taking this medicine or for 3 months after stopping it.  What side effects may I notice from receiving this medicine?  Side effects that you should report to your doctor or health care professional as soon as possible:  -allergic reactions like skin rash, itching or hives, swelling of the face, lips, or tongue  -black, tarry stools  -bloody or watery diarrhea  -breathing problems  -change in emotions or moods  -change in sex drive  -changes in vision  -chest pain or chest tightness  -chills  -confusion  -cough  -facial flushing  -fever  -headache  -signs and symptoms of high blood sugar such as dizziness; dry mouth; dry skin; fruity breath; nausea; stomach pain; increased hunger or thirst; increased urination  -signs and symptoms of liver injury like dark yellow or brown urine; general ill feeling or flu-like symptoms; light-colored stools; loss of appetite; nausea; right upper belly pain;   unusually weak or tired; yellowing of the eyes or skin  -stomach pain  -trouble passing urine or change in the amount of urine  -weight gain or weight loss  Side effects that usually do not require medical attention (report these to your doctor or health care professional if they continue or are bothersome):  -bone pain  -constipation  -loss of appetite  -muscle pain  -nausea  -swelling of the ankles, feet, hands  -tiredness  This list may not describe all possible side effects. Call your doctor  for medical advice about side effects. You may report side effects to FDA at 1-800-FDA-1088.  Where should I keep my medicine?  This drug is given in a hospital or clinic and will not be stored at home.  NOTE: This sheet is a summary. It may not cover all possible information. If you have questions about this medicine, talk to your doctor, pharmacist, or health care provider.  © 2018 [employer]vier/Gold Standard (2016-05-19 15:50:36)

## 2017-09-26 NOTE — Progress Notes (Signed)
[facility] Telephone:([phone]) [phone]   Fax:(336) [phone]  OFFICE PROGRESS NOTE  [name], MD [address] [place] [address]  DIAGNOSIS: Stage IIIA (T2a, N2, M0) non-small cell lung cancer, adenocarcinoma presented with left lower lobe lung mass in addition to mediastinal lymphadenopathy diagnosed in June 2018. The patient also has suspicious soft tissue density in the pancreatic head but this has been present for 3 years on previous CT scan and unlikely to be related to [name], last dose was given 07/03/2017.  CURRENT THERAPY: Consolidation treatment with immunotherapy with Imfinzi (Durvalumab) 10 MG/KG every 2 weeks, first dose 08/15/2017.  Status post 3 cycles.  INTERVAL HISTORY: James Zamora 61 y.o. male returns to the clinic today for follow-up visit accompanied by his sister.  The patient continues to complain of fatigue and weakness as well as the swelling of the lower extremities and right upper extremity.  He does not take the Lasix as prescribed.  He is also on Lovenox for the history of pulmonary embolism.  The patient denied having any chest pain but continues to have shortness of breath at baseline increased with exertion and he is currently on home oxygen.  He lost a few pounds since his last visit.  He is here today for evaluation before starting cycle #4 of treatment with Imfinzi (Durvalumab).  MEDICAL HISTORY: Past Medical History:  Diagnosis Date  . Adenocarcinoma of left lung, stage 3 ([facility]) 05/10/2017  . Aortic atherosclerosis ([facility]) 07/31/2017  . Asthma   . Chronic diastolic CHF (congestive heart failure) ([facility])   . Chronic fatigue 08/02/2017  . Lung mass   . Non-small cell lung cancer ([facility]) 11/13/2015  . Obesity   . Perforated bowel ([facility])     ALLERGIES:  is allergic to bee  venom and penicillins.  MEDICATIONS:  Current Outpatient Medications  Medication Sig Dispense Refill  . aspirin EC 81 MG EC tablet Take 1 tablet (81 mg total) by mouth daily. 30 tablet 1  . enoxaparin (LOVENOX) 150 MG/ML injection Inject 0.9 mLs (135 mg total) into the skin daily. 30 Syringe 1  . fluticasone furoate-vilanterol (BREO ELLIPTA) 200-25 MCG/INH AEPB Inhale 1 puff into the lungs daily. 1 each 5  . furosemide (LASIX) 40 MG tablet Take 3 tablets (120 mg total) by mouth daily. 90 tablet 6  . lidocaine-prilocaine (EMLA) cream Apply 1 application topically as needed. Apply small amount to port site 1-1.5 hours before treatment. Cover with plastic wrap. 30 g 0  . OXYGEN Inhale 3 L into the lungs continuous.    . potassium chloride SA (KLOR-CON M20) 20 MEQ tablet Take 1 tablet (20 mEq total) by mouth daily. 30 tablet 6  . prochlorperazine (COMPAZINE) 10 MG tablet Take 1 tablet (10 mg total) by mouth every 6 (six) hours as needed for nausea or vomiting. 30 tablet 0   No current facility-administered medications for this visit.     SURGICAL HISTORY:  Past Surgical History:  Procedure Laterality Date  . CHOLECYSTECTOMY    . COLONOSCOPY N/A 07/31/2016   Procedure: COLONOSCOPY;  Surgeon: Danie Binder, MD;  Location: [facility];  Service: Endoscopy;  Laterality: N/A;  1:45 pm  . ESOPHAGOGASTRODUODENOSCOPY N/A 07/31/2016   Procedure: ESOPHAGOGASTRODUODENOSCOPY (EGD);  Surgeon: Danie Binder, MD;  Location: [facility];  Service: Endoscopy;  Laterality: N/A;  . EUS N/A 06/07/2017   Procedure: UPPER ENDOSCOPIC ULTRASOUND (EUS) LINEAR;  Surgeon: Milus Banister, MD;  Location: [facility];  Service: Endoscopy;  Laterality: N/A;  . HERNIA REPAIR    . IR FLUORO GUIDE PORT INSERTION RIGHT  07/11/2017  . IR US GUIDE VASC ACCESS RIGHT  07/11/2017    REVIEW OF SYSTEMS:  A comprehensive review of systems was negative except for: Constitutional: positive for fatigue Respiratory: positive for  dyspnea on exertion Musculoskeletal: positive for muscle weakness Swelling of the lower extremities   PHYSICAL EXAMINATION: General appearance: alert, cooperative, fatigued and no distress Head: Normocephalic, without obvious abnormality, atraumatic Neck: no adenopathy, no JVD, supple, symmetrical, trachea midline and thyroid not enlarged, symmetric, no tenderness/mass/nodules Lymph nodes: Cervical, supraclavicular, and axillary nodes normal. Resp: clear to auscultation bilaterally Back: symmetric, no curvature. ROM normal. No CVA tenderness. Cardio: regular rate and rhythm, S1, S2 normal, no murmur, click, rub or gallop GI: soft, non-tender; bowel sounds normal; no masses,  no organomegaly Extremities: edema 2+  ECOG PERFORMANCE STATUS: 2 - Symptomatic, <50% confined to bed  Blood pressure (!) 103/57, pulse (!) 113, temperature 98.1 F (36.7 C), temperature source Oral, resp. rate 18, height 5\' 7"  (1.702 m), weight 200 lb 11.2 oz (91 kg), SpO2 92 %.  LABORATORY DATA: Lab Results  Component Value Date   WBC 5.1 09/26/2017   HGB 10.7 (L) 09/26/2017   HCT 33.9 (L) 09/26/2017   MCV 85.0 09/26/2017   PLT 249 09/26/2017      Chemistry      Component Value Date/Time   NA 137 09/26/2017 0928   K 3.9 09/26/2017 0928   CL 102 08/01/2017 0413   CO2 28 09/26/2017 0928   BUN 15.3 09/26/2017 0928   CREATININE 1.1 09/26/2017 0928      Component Value Date/Time   CALCIUM 9.5 09/26/2017 0928   ALKPHOS 58 09/26/2017 0928   AST 15 09/26/2017 0928   ALT 11 09/26/2017 0928   BILITOT 0.44 09/26/2017 0928       RADIOGRAPHIC STUDIES: No results found.  ASSESSMENT AND PLAN:  This is a very pleasant 61 years old African-American male recently diagnosed with a stage IIIa non-small cell lung cancer, adenocarcinoma presented with left lower lobe lung mass in addition to mediastinal lymphadenopathy.  The patient underwent a course of concurrent chemoradiation with weekly carboplatin and  paclitaxel status post 7 cycles and tolerated his treatment fairly well. He is currently undergoing treatment with consolidation immunotherapy with Imfinzi (Durvalumab) status post 3 cycles.   He continues to tolerate the treatment well with no significant adverse effects. He will proceed with cycle #4 today as a scheduled. He has a swelling of the lower extremities as well as right upper extremity and a strongly recommended for him to take his Lasix as prescribed. For the history of pulmonary embolism, he will continue his current treatment with Lovenox. I will see the patient back for follow-up visit in 2 weeks for evaluation before starting cycle #5. The patient voices understanding of current disease status and treatment options and is in agreement with the current care plan. All questions were answered. The patient knows to call the clinic with any problems, questions or concerns. We can certainly see the patient much sooner if necessary.  Disclaimer: This note was dictated with voice recognition software. Similar sounding words can inadvertently be transcribed and may not be corrected upon review.

## 2017-09-27 ENCOUNTER — Telehealth: Payer: Self-pay | Admitting: Internal Medicine

## 2017-09-27 ENCOUNTER — Telehealth: Payer: Self-pay | Admitting: *Deleted

## 2017-09-27 NOTE — Telephone Encounter (Signed)
Scheduled appt per 11/28 los -patient to get an updated schedule next visit.

## 2017-09-27 NOTE — Telephone Encounter (Signed)
Oncology Nurse Navigator Documentation  Oncology Nurse Navigator Flowsheets 09/27/2017  Navigator Location [facility]  Navigator Encounter Type Telephone/I received a message from scheduling. Patient's family member was upset regarding schedule. I called patient's sister and we spoke. I listened as she explained what her concerns were.  I clarified.  She was thankful for the call and update.    Telephone Outgoing Call  Treatment Phase Treatment  Barriers/Navigation Needs Education  Education Other  Interventions Education  Education Method Verbal  Acuity Level 2  Acuity Level 2 Educational needs  Time Spent with Patient 30

## 2017-10-01 DIAGNOSIS — R0902 Hypoxemia: Secondary | ICD-10-CM | POA: Diagnosis not present

## 2017-10-01 DIAGNOSIS — C3432 Malignant neoplasm of lower lobe, left bronchus or lung: Secondary | ICD-10-CM | POA: Diagnosis not present

## 2017-10-01 DIAGNOSIS — Z9981 Dependence on supplemental oxygen: Secondary | ICD-10-CM | POA: Diagnosis not present

## 2017-10-01 DIAGNOSIS — M109 Gout, unspecified: Secondary | ICD-10-CM | POA: Diagnosis not present

## 2017-10-10 ENCOUNTER — Ambulatory Visit (HOSPITAL_BASED_OUTPATIENT_CLINIC_OR_DEPARTMENT_OTHER): Payer: Medicare Other | Admitting: Oncology

## 2017-10-10 ENCOUNTER — Ambulatory Visit (HOSPITAL_BASED_OUTPATIENT_CLINIC_OR_DEPARTMENT_OTHER): Payer: Medicare Other

## 2017-10-10 ENCOUNTER — Encounter: Payer: Self-pay | Admitting: Oncology

## 2017-10-10 ENCOUNTER — Ambulatory Visit: Payer: Medicare Other

## 2017-10-10 ENCOUNTER — Other Ambulatory Visit (HOSPITAL_BASED_OUTPATIENT_CLINIC_OR_DEPARTMENT_OTHER): Payer: Medicare Other

## 2017-10-10 VITALS — BP 113/67 | HR 118 | Temp 97.5°F | Resp 18 | Wt 195.9 lb

## 2017-10-10 VITALS — HR 67

## 2017-10-10 DIAGNOSIS — Z5112 Encounter for antineoplastic immunotherapy: Secondary | ICD-10-CM

## 2017-10-10 DIAGNOSIS — Z7901 Long term (current) use of anticoagulants: Secondary | ICD-10-CM

## 2017-10-10 DIAGNOSIS — C3432 Malignant neoplasm of lower lobe, left bronchus or lung: Secondary | ICD-10-CM

## 2017-10-10 DIAGNOSIS — M7989 Other specified soft tissue disorders: Secondary | ICD-10-CM

## 2017-10-10 DIAGNOSIS — Z79899 Other long term (current) drug therapy: Secondary | ICD-10-CM

## 2017-10-10 DIAGNOSIS — I2699 Other pulmonary embolism without acute cor pulmonale: Secondary | ICD-10-CM | POA: Diagnosis not present

## 2017-10-10 DIAGNOSIS — C3492 Malignant neoplasm of unspecified part of left bronchus or lung: Secondary | ICD-10-CM

## 2017-10-10 DIAGNOSIS — R5382 Chronic fatigue, unspecified: Secondary | ICD-10-CM

## 2017-10-10 LAB — CBC WITH DIFFERENTIAL/PLATELET
BASO%: 0.2 % (ref 0.0–2.0)
Basophils Absolute: 0 10*3/uL (ref 0.0–0.1)
EOS%: 0.9 % (ref 0.0–7.0)
Eosinophils Absolute: 0.1 10*3/uL (ref 0.0–0.5)
HEMATOCRIT: 36 % — AB (ref 38.4–49.9)
HGB: 11.1 g/dL — ABNORMAL LOW (ref 13.0–17.1)
LYMPH#: 1.4 10*3/uL (ref 0.9–3.3)
LYMPH%: 26.2 % (ref 14.0–49.0)
MCH: 26.2 pg — ABNORMAL LOW (ref 27.2–33.4)
MCHC: 30.8 g/dL — AB (ref 32.0–36.0)
MCV: 84.9 fL (ref 79.3–98.0)
MONO#: 0.4 10*3/uL (ref 0.1–0.9)
MONO%: 6.7 % (ref 0.0–14.0)
NEUT%: 66 % (ref 39.0–75.0)
NEUTROS ABS: 3.5 10*3/uL (ref 1.5–6.5)
Platelets: 207 10*3/uL (ref 140–400)
RBC: 4.24 10*6/uL (ref 4.20–5.82)
RDW: 15.1 % — AB (ref 11.0–14.6)
WBC: 5.3 10*3/uL (ref 4.0–10.3)

## 2017-10-10 LAB — COMPREHENSIVE METABOLIC PANEL
ALBUMIN: 3.2 g/dL — AB (ref 3.5–5.0)
ALK PHOS: 63 U/L (ref 40–150)
ALT: 11 U/L (ref 0–55)
ANION GAP: 12 meq/L — AB (ref 3–11)
AST: 13 U/L (ref 5–34)
BILIRUBIN TOTAL: 0.29 mg/dL (ref 0.20–1.20)
BUN: 14.1 mg/dL (ref 7.0–26.0)
CALCIUM: 9.4 mg/dL (ref 8.4–10.4)
CO2: 26 mEq/L (ref 22–29)
Chloride: 102 mEq/L (ref 98–109)
Creatinine: 1.1 mg/dL (ref 0.7–1.3)
GLUCOSE: 112 mg/dL (ref 70–140)
POTASSIUM: 3.7 meq/L (ref 3.5–5.1)
Sodium: 140 mEq/L (ref 136–145)
TOTAL PROTEIN: 8.4 g/dL — AB (ref 6.4–8.3)

## 2017-10-10 LAB — TSH: TSH: 0.14 m(IU)/L — ABNORMAL LOW (ref 0.320–4.118)

## 2017-10-10 MED ORDER — HEPARIN SOD (PORK) LOCK FLUSH 100 UNIT/ML IV SOLN
500.0000 [IU] | Freq: Once | INTRAVENOUS | Status: AC | PRN
  Administered 2017-10-10: 500 [IU]
  Filled 2017-10-10: qty 5

## 2017-10-10 MED ORDER — ENOXAPARIN SODIUM 150 MG/ML ~~LOC~~ SOLN: 1.5000 mg/kg | SUBCUTANEOUS | 1 refills | Status: DC

## 2017-10-10 MED ORDER — SODIUM CHLORIDE 0.9% FLUSH
10.0000 mL | INTRAVENOUS | Status: DC | PRN
  Administered 2017-10-10: 10 mL
  Filled 2017-10-10: qty 10

## 2017-10-10 MED ORDER — SODIUM CHLORIDE 0.9 % IV SOLN
Freq: Once | INTRAVENOUS | Status: AC
  Administered 2017-10-10: 15:00:00 via INTRAVENOUS

## 2017-10-10 MED ORDER — SODIUM CHLORIDE 0.9% FLUSH
10.0000 mL | Freq: Once | INTRAVENOUS | Status: AC
  Administered 2017-10-10: 10 mL
  Filled 2017-10-10: qty 10

## 2017-10-10 MED ORDER — DURVALUMAB 500 MG/10ML IV SOLN
860.0000 mg | Freq: Once | INTRAVENOUS | Status: AC
  Administered 2017-10-10: 860 mg via INTRAVENOUS
  Filled 2017-10-10: qty 7.2

## 2017-10-10 NOTE — Assessment & Plan Note (Signed)
This is a very pleasant 61 year old African-American male recently diagnosed with a stage IIIa non-small cell lung cancer, adenocarcinoma presented with left lower lobe lung mass in addition to mediastinal lymphadenopathy.  The patient underwent a course of concurrent chemoradiation with weekly carboplatin and paclitaxel status post 7 cycles and tolerated his treatment fairly well. He is currently undergoing treatment with consolidation immunotherapy with Imfinzi (Durvalumab) status post 4 cycles.   He continues to tolerate the treatment well with no significant adverse effects.  The patient was seen with Dr. Julien Nordmann. He will proceed with cycle #5 today as a scheduled. He was reminded to take his Lasix as prescribed. For the history of pulmonary embolism, he will continue his current treatment with Lovenox.  Lovenox was refilled today.  I will see the patient back for follow-up visit in 2 weeks for evaluation before starting cycle #6.  The patient voices understanding of current disease status and treatment options and is in agreement with the current care plan. All questions were answered. The patient knows to call the clinic with any problems, questions or concerns. We can certainly see the patient much sooner if necessary.

## 2017-10-10 NOTE — Patient Instructions (Signed)
[facility] Discharge Instructions for Patients Receiving Chemotherapy  Today you received the following chemotherapy agents Durvalumab  To help prevent nausea and vomiting after your treatment, we encourage you to take your nausea medication as directed   If you develop nausea and vomiting that is not controlled by your nausea medication, call the clinic.   BELOW ARE SYMPTOMS THAT SHOULD BE REPORTED IMMEDIATELY:  *FEVER GREATER THAN 100.5 F  *CHILLS WITH OR WITHOUT FEVER  NAUSEA AND VOMITING THAT IS NOT CONTROLLED WITH YOUR NAUSEA MEDICATION  *UNUSUAL SHORTNESS OF BREATH  *UNUSUAL BRUISING OR BLEEDING  TENDERNESS IN MOUTH AND THROAT WITH OR WITHOUT PRESENCE OF ULCERS  *URINARY PROBLEMS  *BOWEL PROBLEMS  UNUSUAL RASH Items with * indicate a potential emergency and should be followed up as soon as possible.  Feel free to call the clinic should you have any questions or concerns. The clinic phone number is [phone].  Please show the [facility] at check-in to the Emergency Department and triage nurse.

## 2017-10-10 NOTE — Progress Notes (Signed)
[facility] OFFICE PROGRESS NOTE  [name], MD [address] [place] [address]  DIAGNOSIS: Stage IIIA (T2a, N2, M0) non-small cell lung cancer, adenocarcinoma presented with left lower lobe lung mass in addition to mediastinal lymphadenopathy diagnosed in June 2018. The patient also has suspicious soft tissue density in the pancreatic head but this has been present for 3 years on previous CT scan and unlikely to be related to [name], last dose was given 07/03/2017.  CURRENT THERAPY: Consolidation treatment with immunotherapy with Imfinzi (Durvalumab) 10 MG/KG every 2 weeks, first dose 08/15/2017.  Status post 4 cycles.  INTERVAL HISTORY: James Zamora 61 y.o. male returns for routine follow-up visit accompanied by his sister.  The patient is feeling better today than he did at his last visit.  He still reports fatigue, but is not as weak as he was last time.  He remains on Lovenox for the history of the pulmonary embolism requests a refill today.  The patient denies fevers and chills.  Denies chest pain and hemoptysis.  He has his baseline shortness of breath and remains on home oxygen.  He has lost a few pounds since his last visit.  Denies nausea, vomiting, constipation, diarrhea.  The patient is here for evaluation prior to cycle #5 of treatment with Imfinzi.  MEDICAL HISTORY: Past Medical History:  Diagnosis Date  . Adenocarcinoma of left lung, stage 3 ([place]) 05/10/2017  . Aortic atherosclerosis ([place]) 07/31/2017  . Asthma   . Chronic diastolic CHF (congestive heart failure) ([place])   . Chronic fatigue 08/02/2017  . Lung mass   . Non-small cell lung cancer ([place]) 11/13/2015  . Obesity   . Perforated bowel ([place])     ALLERGIES:  is allergic to bee venom and penicillins.  MEDICATIONS:  Current  Outpatient Medications  Medication Sig Dispense Refill  . aspirin EC 81 MG EC tablet Take 1 tablet (81 mg total) by mouth daily. 30 tablet 1  . enoxaparin (LOVENOX) 150 MG/ML injection Inject 0.9 mLs (135 mg total) into the skin daily. 30 Syringe 1  . furosemide (LASIX) 40 MG tablet Take 3 tablets (120 mg total) by mouth daily. 90 tablet 6  . lidocaine-prilocaine (EMLA) cream Apply 1 application topically as needed. Apply small amount to port site 1-1.5 hours before treatment. Cover with plastic wrap. 30 g 0  . OXYGEN Inhale 3 L into the lungs continuous.    . fluticasone furoate-vilanterol (BREO ELLIPTA) 200-25 MCG/INH AEPB Inhale 1 puff into the lungs daily. (Patient not taking: Reported on 09/26/2017) 1 each 5  . potassium chloride SA (KLOR-CON M20) 20 MEQ tablet Take 1 tablet (20 mEq total) by mouth daily. 30 tablet 6  . prochlorperazine (COMPAZINE) 10 MG tablet Take 1 tablet (10 mg total) by mouth every 6 (six) hours as needed for nausea or vomiting. (Patient not taking: Reported on 10/10/2017) 30 tablet 0   No current facility-administered medications for this visit.    Facility-Administered Medications Ordered in Other Visits  Medication Dose Route Frequency Provider Last Rate Last Dose  . durvalumab (IMFINZI) 860 mg in sodium chloride 0.9 % 100 mL chemo infusion  860 mg Intravenous Once Curt Bears, MD 117 mL/hr at 10/10/17 1513 860 mg at 10/10/17 1513  . heparin lock flush 100 unit/mL  500 Units Intracatheter Once PRN Julien Nordmann,  Mohamed, MD      . sodium chloride flush (NS) 0.9 % injection 10 mL  10 mL Intracatheter PRN Curt Bears, MD        SURGICAL HISTORY:  Past Surgical History:  Procedure Laterality Date  . CHOLECYSTECTOMY    . COLONOSCOPY N/A 07/31/2016   Procedure: COLONOSCOPY;  Surgeon: Danie Binder, MD;  [facility];  Service: Endoscopy;  Laterality: N/A;  1:45 pm  . ESOPHAGOGASTRODUODENOSCOPY N/A 07/31/2016   Procedure: ESOPHAGOGASTRODUODENOSCOPY  (EGD);  Surgeon: Danie Binder, MD;  Location: [facility];  Service: Endoscopy;  Laterality: N/A;  . EUS N/A 06/07/2017   Procedure: UPPER ENDOSCOPIC ULTRASOUND (EUS) LINEAR;  Surgeon: Milus Banister, MD;  Location: [facility];  Service: Endoscopy;  Laterality: N/A;  . HERNIA REPAIR    . IR FLUORO GUIDE PORT INSERTION RIGHT  07/11/2017  . IR US GUIDE VASC ACCESS RIGHT  07/11/2017    REVIEW OF SYSTEMS:   Review of Systems  Constitutional: Negative for appetite change, chills,fever. Positive for fatigue. HENT:   Negative for mouth sores, nosebleeds, sore throat and trouble swallowing.   Eyes: Negative for eye problems and icterus.  Respiratory: Negative for cough, hemoptysis, and wheezing.  He has baseline shortness of breath and continues on home oxygen. Cardiovascular: Negative for chest pain and leg swelling.  Gastrointestinal: Negative for abdominal pain, constipation, diarrhea, nausea and vomiting.  Genitourinary: Negative for bladder incontinence, difficulty urinating, dysuria, frequency and hematuria.   Musculoskeletal: Negative for back pain, gait problem, neck pain and neck stiffness.  Skin: Negative for itching and rash.  Neurological: Negative for dizziness, extremity weakness, gait problem, headaches, light-headedness and seizures.  Hematological: Negative for adenopathy. Does not bruise/bleed easily.  Psychiatric/Behavioral: Negative for confusion, depression and sleep disturbance. The patient is not nervous/anxious.     PHYSICAL EXAMINATION:  Blood pressure 113/67, pulse (!) 118, temperature (!) 97.5 F (36.4 C), temperature source Oral, resp. rate 18, weight 195 lb 14.4 oz (88.9 kg), SpO2 93 %.  ECOG PERFORMANCE STATUS: 2 - Symptomatic, <50% confined to bed  Physical Exam  Constitutional: Oriented to person, place, and time and well-developed, well-nourished, and in no distress. No distress.  HENT:  Head: Normocephalic and atraumatic.  Mouth/Throat: Oropharynx is  clear and moist. No oropharyngeal exudate.  Eyes: Conjunctivae are normal. Right eye exhibits no discharge. Left eye exhibits no discharge. No scleral icterus.  Neck: Normal range of motion. Neck supple.  Cardiovascular: Normal rate, regular rhythm, normal heart sounds and intact distal pulses.  1+ bilateral lower extremity edema.  Pulmonary/Chest: Effort normal and breath sounds normal. No respiratory distress. No wheezes. No rales.  Abdominal: Soft. Bowel sounds are normal. Exhibits no distension and no mass. There is no tenderness.  Musculoskeletal: Normal range of motion.  Lymphadenopathy:    No cervical adenopathy.  Neurological: Alert and oriented to person, place, and time. Exhibits normal muscle tone. Gait normal. Coordination normal.  Skin: Skin is warm and dry. No rash noted. Not diaphoretic. No erythema. No pallor.  Psychiatric: Mood, memory and judgment normal.  Vitals reviewed.  LABORATORY DATA: Lab Results  Component Value Date   WBC 5.3 10/10/2017   HGB 11.1 (L) 10/10/2017   HCT 36.0 (L) 10/10/2017   MCV 84.9 10/10/2017   PLT 207 10/10/2017      Chemistry      Component Value Date/Time   NA 140 10/10/2017 1314   K 3.7 10/10/2017 1314   CL 102 08/01/2017 0413   CO2  26 10/10/2017 1314   BUN 14.1 10/10/2017 1314   CREATININE 1.1 10/10/2017 1314      Component Value Date/Time   CALCIUM 9.4 10/10/2017 1314   ALKPHOS 63 10/10/2017 1314   AST 13 10/10/2017 1314   ALT 11 10/10/2017 1314   BILITOT 0.29 10/10/2017 1314       RADIOGRAPHIC STUDIES:  No results found.   ASSESSMENT/PLAN:  Non-small cell lung cancer (HCC) - Adenocarcinoma This is a very pleasant 61 year old African-American male recently diagnosed with a stage IIIa non-small cell lung cancer, adenocarcinoma presented with left lower lobe lung mass in addition to mediastinal lymphadenopathy.  The patient underwent a course of concurrent chemoradiation with weekly carboplatin and paclitaxel status  post 7 cycles and tolerated his treatment fairly well. He is currently undergoing treatment with consolidation immunotherapy with Imfinzi (Durvalumab) status post 4 cycles.   He continues to tolerate the treatment well with no significant adverse effects.  The patient was seen with Dr. Julien Nordmann. He will proceed with cycle #5 today as a scheduled. He was reminded to take his Lasix as prescribed. For the history of pulmonary embolism, he will continue his current treatment with Lovenox.  Lovenox was refilled today.  I will see the patient back for follow-up visit in 2 weeks for evaluation before starting cycle #6.  The patient voices understanding of current disease status and treatment options and is in agreement with the current care plan. All questions were answered. The patient knows to call the clinic with any problems, questions or concerns. We can certainly see the patient much sooner if necessary.  No orders of the defined types were placed in this encounter.  Mikey Bussing, DNP, AGPCNP-BC, AOCNP 10/10/17  ADDENDUM: Hematology/Oncology Attending: I had a face-to-face encounter with the patient.  I recommended his care plan.  This is a very pleasant 61 years old African-American male with a stage IIIa non-small cancer status post concurrent chemoradiation and currently undergoing consolidation treatment with immunotherapy status post 4 cycles.  He tolerated the last cycle of his treatment fairly well.  I recommended for the patient to proceed with cycle #5 today as a scheduled. I will see him back for follow-up visit in 2 weeks for evaluation before starting cycle #6. He was advised to call immediately if he has any concerning symptoms in the interval.  Disclaimer: This note was dictated with voice recognition software. Similar sounding words can inadvertently be transcribed and may be missed upon review. Eilleen Kempf, MD 10/10/17

## 2017-10-11 NOTE — Progress Notes (Signed)
Cardiology Office Note   Date:  10/12/2017   ID:  James, Zamora James Zamora, James Zamora, MRN [id number]  PCP:  [name], MD  Cardiologist:  [name], MD Chief Complaint  Patient presents with  . Congestive Heart Failure     History of Present Illness: James Zamora is a 61 y.o. male who presents for ongoing assessment and management diastolic heart failure, chronic lower extremity edema, recent lung mass found to be Stage IIIA (T2a, N2, M0) non-small cell lung cancer, adenocarcinoma, followed by Dr.Mohamed on chemo with weekly carboplatin for AUC of 2 and paclitaxel 45 MG/M2 (cardiotoxic)Status post 7 cycles, and additionally  Imfinzi (Durvalumab) 10 MG/KG every 2 weeks, first dose 08/15/2017.  He was last seen by oncology on 10/10/2017.  He was continued on low molecular weight heparin, Lasix, and was to follow back in 2 weeks before starting his cycle #6 of immunotherapy.   He comes today having lost a considerable amount of weight.  He is not hungry.  He has no complaints of pain currently.  He did have a episode of hypoxia while undergoing chemotherapy.  He was increased on his oxygen to 5 L but is now been reduced to 3 L.  Was also found to have a right DVT posterior to the knee in the popliteal vein.  His sister who is with him states that he continues to have chronic lower extremity edema.  She make sure that he takes his medications every day including his Lasix.  Past Medical History:  Diagnosis Date  . Adenocarcinoma of left lung, stage 3 ([place]) 05/10/2017  . Aortic atherosclerosis ([place]) 07/31/2017  . Asthma   . Chronic diastolic CHF (congestive heart failure) ([place])   . Chronic fatigue 08/02/2017  . Lung mass   . Non-small cell lung cancer ([place]) 11/13/2015  . Obesity   . Perforated bowel [facility])     Past Surgical History:  Procedure Laterality Date  . CHOLECYSTECTOMY    . COLONOSCOPY N/A 07/31/2016   Procedure: COLONOSCOPY;  Surgeon: Danie Binder, MD;  [facility];  Service: Endoscopy;  Laterality: N/A;  1:45 pm  . ESOPHAGOGASTRODUODENOSCOPY N/A 07/31/2016   Procedure: ESOPHAGOGASTRODUODENOSCOPY (EGD);  Surgeon: Danie Binder, MD;  [facility];  Service: Endoscopy;  Laterality: N/A;  . EUS N/A 06/07/2017   Procedure: UPPER ENDOSCOPIC ULTRASOUND (EUS) LINEAR;  Surgeon: Milus Banister, MD;  [facility];  Service: Endoscopy;  Laterality: N/A;  . HERNIA REPAIR    . IR FLUORO GUIDE PORT INSERTION RIGHT  07/11/2017  . IR US GUIDE VASC ACCESS RIGHT  07/11/2017     Current Outpatient Medications  Medication Sig Dispense Refill  . aspirin EC 81 MG EC tablet Take 1 tablet (81 mg total) by mouth daily. 30 tablet 1  . enoxaparin (LOVENOX) 150 MG/ML injection Inject 0.9 mLs (135 mg total) into the skin daily. 30 Syringe 1  . fluticasone furoate-vilanterol (BREO ELLIPTA) 200-25 MCG/INH AEPB Inhale 1 puff into the lungs daily. 1 each 5  . furosemide (LASIX) 40 MG tablet Take 1 tablet (40 mg total) by mouth daily. 90 tablet 3  . lidocaine-prilocaine (EMLA) cream Apply 1 application topically as needed. Apply small amount to port site 1-1.5 hours before treatment. Cover with plastic wrap. 30 g 0  . OXYGEN Inhale 3 L into the lungs continuous.    . potassium chloride SA (KLOR-CON M20) 20 MEQ tablet Take 1 tablet (20 mEq total) by mouth daily. 30 tablet  6   No current facility-administered medications for this visit.     Allergies:   Bee venom and Penicillins    Social History:  The patient  reports that he quit smoking about 5 years ago. His smoking use included cigarettes. He has a 20.00 pack-year smoking history. He quit smokeless tobacco use about 3 years ago. His smokeless tobacco use included chew. He reports that he does not drink alcohol or use drugs.   Family History:  The patient's family history includes Asthma in his father; Diabetes in his mother; Hypertension in his father and mother; Prostate cancer in his father.    ROS:  All other systems are reviewed and negative. Unless otherwise mentioned in H&P    PHYSICAL EXAM: VS:  BP 108/76   Pulse (!) 113   Ht 5\' 6"  (1.676 m)   Wt 196 lb (88.9 kg)   SpO2 92%   BMI Zamora.64 kg/m  , BMI Body mass index is Zamora.64 kg/m. GEN: Well nourished, well developed, in no acute distress  HEENT: normal  Neck: no JVD, carotid bruits, or masses Cardiac: RRR; no murmurs, rubs, or gallops, significant woody edema bilaterally in the dependent position left greater than right.  Difficult to palpate pulses. Respiratory: Some crackles are noted in the right base, cleared with coughing.  He continues to wear oxygen via nasal cannula. GI: soft, nontender, nondistended, + BS MS: no deformity or atrophy  Skin: warm and dry, no rash Neuro:  Strength and sensation are intact Psych: euthymic mood, flat affect   Recent Labs: 03/08/2017: B Natriuretic Peptide 19.0 07/30/2017: Magnesium 1.6 10/10/2017: ALT 11; BUN 14.1; Creatinine 1.1; HGB 11.1; Platelets 207; Potassium 3.7; Sodium 140; TSH 0.140    Lipid Panel No results found for: CHOL, TRIG, HDL, CHOLHDL, VLDL, LDLCALC, LDLDIRECT    Wt Readings from Last 3 Encounters:  10/12/17 196 lb (88.9 kg)  10/10/17 195 lb 14.4 oz (88.9 kg)  09/26/17 200 lb 11.2 oz (91 kg)      Other studies Reviewed:  Echocardiogram 06/08/2016 Left ventricle: The cavity size was normal. Wall thickness was increased in a pattern of moderate LVH. Systolic function was normal. The estimated ejection fraction was in the range of 60% to 65%. Wall motion was normal; there were no regional wall motion abnormalities. Left ventricular diastolic function parameters were normal. - Aortic valve: Valve area (VTI): 2.72 cm^2. Valve area (Vmax): 2.72 cm^2. Valve area (Vmean): 2.71 cm^2. - Right ventricle: The cavity size was mildly dilated. - Atrial septum: No defect or patent foramen ovale was identified.  ASSESSMENT AND PLAN:  1.  Chronic diastolic  heart failure: He continues to have dependent edema.  His sister says that he keeps his legs down all the time and often does not take off his shoes and socks for days.  He is of low intellect but is stubborn concerning taking off shoes and socks.  He also sleeps in a chair and does not keep his feet elevated.  I have talked with him about removing his shoes and socks and elevating his legs to allow them to be cleaned and to help with edema.  We will continue Lasix as directed.  Labs are being followed closely by oncology.  Recent labs completed on 10/10/2017 revealed creatinine of 1.1, potassium of 3.7, LFTs within normal limits with reduced albumin at 3.2.  He remains mildly anemic with a hemoglobin 11.1 hematocrit of 36.0.  2.  Hypertension: Pressure is soft today he is not on  any antihypertensive medications with the exception of the diuretic.  3.  Non-small cell lung cancer.  Has received 6 courses of chemotherapy.with carboplatin and paclitaxel.  These medications are cardiotoxic.  We will have echocardiogram completed to evaluate for changes in LV function while undergoing therapy.  He has completed chemo is now on immunotherapy.  He is due to follow-up with oncology on October 25, 2017.   Current medicines are reviewed at length with the patient today.    Labs/ tests ordered today include: Echocardiogram  [name]. [name], ANP, AACC   10/12/2017 2:30 PM    [facility] [address] [place], [place] [address] Phone: [phone]; Fax: [phone]

## 2017-10-12 ENCOUNTER — Ambulatory Visit (INDEPENDENT_AMBULATORY_CARE_PROVIDER_SITE_OTHER): Payer: Medicare Other | Admitting: Adult Health

## 2017-10-12 ENCOUNTER — Encounter: Payer: Self-pay | Admitting: Adult Health

## 2017-10-12 VITALS — BP 108/76 | HR 113 | Ht 66.0 in | Wt 196.0 lb

## 2017-10-12 DIAGNOSIS — C3491 Malignant neoplasm of unspecified part of right bronchus or lung: Secondary | ICD-10-CM | POA: Diagnosis not present

## 2017-10-12 DIAGNOSIS — I10 Essential (primary) hypertension: Secondary | ICD-10-CM | POA: Diagnosis not present

## 2017-10-12 DIAGNOSIS — I5031 Acute diastolic (congestive) heart failure: Secondary | ICD-10-CM | POA: Diagnosis not present

## 2017-10-12 MED ORDER — FUROSEMIDE 40 MG PO TABS: 40.0000 mg | ORAL_TABLET | Freq: Every day | ORAL | 3 refills | Status: DC

## 2017-10-12 NOTE — Patient Instructions (Signed)
Medication Instructions:  Your physician recommends that you continue on your current medications as directed. Please refer to the Current Medication list given to you today.   Labwork: NONE   Testing/Procedures: Your physician has requested that you have an echocardiogram. Echocardiography is a painless test that uses sound waves to create images of your heart. It provides your doctor with information about the size and shape of your heart and how well your heart's chambers and valves are working. This procedure takes approximately one hour. There are no restrictions for this procedure.    Follow-Up: Your physician recommends that you schedule a follow-up appointment in: 3 Months with Dr. Harrington Challenger    Any Other Special Instructions Will Be Listed Below (If Applicable).     If you need a refill on your cardiac medications before your next appointment, please call your pharmacy.  Thank you for choosing [facility]!

## 2017-10-15 ENCOUNTER — Ambulatory Visit (HOSPITAL_COMMUNITY)
Admission: RE | Admit: 2017-10-15 | Discharge: 2017-10-15 | Disposition: A | Discharge status: Home / Self Care | Payer: Medicare Other | Source: Ambulatory Visit | Attending: Adult Health | Admitting: Adult Health

## 2017-10-15 ENCOUNTER — Encounter (HOSPITAL_COMMUNITY): Payer: Self-pay

## 2017-10-15 DIAGNOSIS — I5031 Acute diastolic (congestive) heart failure: Secondary | ICD-10-CM

## 2017-10-25 ENCOUNTER — Ambulatory Visit (HOSPITAL_BASED_OUTPATIENT_CLINIC_OR_DEPARTMENT_OTHER): Payer: Medicare Other

## 2017-10-25 ENCOUNTER — Ambulatory Visit: Payer: Medicare Other

## 2017-10-25 ENCOUNTER — Other Ambulatory Visit (HOSPITAL_BASED_OUTPATIENT_CLINIC_OR_DEPARTMENT_OTHER): Payer: Medicare Other

## 2017-10-25 ENCOUNTER — Ambulatory Visit (HOSPITAL_BASED_OUTPATIENT_CLINIC_OR_DEPARTMENT_OTHER): Payer: Medicare Other | Admitting: Oncology

## 2017-10-25 ENCOUNTER — Telehealth: Payer: Self-pay | Admitting: Oncology

## 2017-10-25 VITALS — BP 111/69 | HR 95 | Temp 97.7°F | Resp 18 | Ht 66.0 in | Wt 200.9 lb

## 2017-10-25 DIAGNOSIS — Z5112 Encounter for antineoplastic immunotherapy: Secondary | ICD-10-CM | POA: Diagnosis not present

## 2017-10-25 DIAGNOSIS — C3492 Malignant neoplasm of unspecified part of left bronchus or lung: Secondary | ICD-10-CM

## 2017-10-25 DIAGNOSIS — C3432 Malignant neoplasm of lower lobe, left bronchus or lung: Secondary | ICD-10-CM

## 2017-10-25 DIAGNOSIS — K8689 Other specified diseases of pancreas: Secondary | ICD-10-CM

## 2017-10-25 DIAGNOSIS — R5383 Other fatigue: Secondary | ICD-10-CM | POA: Diagnosis not present

## 2017-10-25 DIAGNOSIS — I2699 Other pulmonary embolism without acute cor pulmonale: Secondary | ICD-10-CM

## 2017-10-25 DIAGNOSIS — Z7901 Long term (current) use of anticoagulants: Secondary | ICD-10-CM | POA: Diagnosis not present

## 2017-10-25 DIAGNOSIS — K869 Disease of pancreas, unspecified: Secondary | ICD-10-CM

## 2017-10-25 LAB — CBC WITH DIFFERENTIAL/PLATELET
BASO%: 0.3 % (ref 0.0–2.0)
Basophils Absolute: 0 10*3/uL (ref 0.0–0.1)
EOS%: 2.7 % (ref 0.0–7.0)
Eosinophils Absolute: 0.1 10*3/uL (ref 0.0–0.5)
HCT: 35.6 % — ABNORMAL LOW (ref 38.4–49.9)
HGB: 10.8 g/dL — ABNORMAL LOW (ref 13.0–17.1)
LYMPH%: 21 % (ref 14.0–49.0)
MCH: 25.7 pg — ABNORMAL LOW (ref 27.2–33.4)
MCHC: 30.3 g/dL — ABNORMAL LOW (ref 32.0–36.0)
MCV: 84.8 fL (ref 79.3–98.0)
MONO#: 0.3 10*3/uL (ref 0.1–0.9)
MONO%: 7.3 % (ref 0.0–14.0)
NEUT%: 68.7 % (ref 39.0–75.0)
NEUTROS ABS: 2.6 10*3/uL (ref 1.5–6.5)
PLATELETS: 163 10*3/uL (ref 140–400)
RBC: 4.2 10*6/uL (ref 4.20–5.82)
RDW: 15.2 % — ABNORMAL HIGH (ref 11.0–14.6)
WBC: 3.7 10*3/uL — ABNORMAL LOW (ref 4.0–10.3)
lymph#: 0.8 10*3/uL — ABNORMAL LOW (ref 0.9–3.3)

## 2017-10-25 LAB — COMPREHENSIVE METABOLIC PANEL
ALT: 6 U/L (ref 0–55)
ANION GAP: 8 meq/L (ref 3–11)
AST: 9 U/L (ref 5–34)
Albumin: 2.7 g/dL — ABNORMAL LOW (ref 3.5–5.0)
Alkaline Phosphatase: 57 U/L (ref 40–150)
BILIRUBIN TOTAL: 0.23 mg/dL (ref 0.20–1.20)
BUN: 9.8 mg/dL (ref 7.0–26.0)
CO2: 27 meq/L (ref 22–29)
CREATININE: 0.9 mg/dL (ref 0.7–1.3)
Calcium: 8.6 mg/dL (ref 8.4–10.4)
Chloride: 104 mEq/L (ref 98–109)
EGFR: >60 mL/min/{1.73_m2} (ref >60)
GLUCOSE: 89 mg/dL (ref 70–140)
Potassium: 3.8 mEq/L (ref 3.5–5.1)
SODIUM: 139 meq/L (ref 136–145)
TOTAL PROTEIN: 6.7 g/dL (ref 6.4–8.3)

## 2017-10-25 MED ORDER — DURVALUMAB 500 MG/10ML IV SOLN
860.0000 mg | Freq: Once | INTRAVENOUS | Status: AC
  Administered 2017-10-25: 860 mg via INTRAVENOUS
  Filled 2017-10-25: qty 10

## 2017-10-25 MED ORDER — HEPARIN SOD (PORK) LOCK FLUSH 100 UNIT/ML IV SOLN
500.0000 [IU] | Freq: Once | INTRAVENOUS | Status: AC | PRN
  Administered 2017-10-25: 500 [IU]
  Filled 2017-10-25: qty 5

## 2017-10-25 MED ORDER — SODIUM CHLORIDE 0.9% FLUSH
10.0000 mL | Freq: Once | INTRAVENOUS | Status: AC
  Administered 2017-10-25: 10 mL
  Filled 2017-10-25: qty 10

## 2017-10-25 MED ORDER — SODIUM CHLORIDE 0.9 % IV SOLN
Freq: Once | INTRAVENOUS | Status: AC
Start: 2017-10-25 — End: 2017-10-25
  Administered 2017-10-25: 16:00:00 via INTRAVENOUS

## 2017-10-25 MED ORDER — SODIUM CHLORIDE 0.9% FLUSH
10.0000 mL | INTRAVENOUS | Status: DC | PRN
  Administered 2017-10-25: 10 mL
  Filled 2017-10-25: qty 10

## 2017-10-25 NOTE — Progress Notes (Signed)
[place] OFFICE PROGRESS NOTE  James Beard, MD [address] [place] [address]  DIAGNOSIS: Stage IIIA (T2a, N2, M0) non-small cell lung cancer, adenocarcinoma presented with left lower lobe lung mass in addition to mediastinal lymphadenopathy diagnosed in June 2018. The patient also has suspicious soft tissue density in the pancreatic head but this has been present for 3 years on previous CT scan and unlikely to be related to [name], last dose was given 07/03/2017.  CURRENT THERAPY: Consolidation treatment with immunotherapy with Imfinzi (Durvalumab) 10 MG/KG every 2 weeks, first dose 08/15/2017. Status post 5cycles.  INTERVAL HISTORY: CORDARIOUS Zamora 61 y.o. male returns for routine follow-up visit accompanied by his sister.  The patient is feeling fine today and has no specific complaints except for fatigue.  He is eating better and is gained back some of his lost weight.  He feels as though his lower extremity edema is stable.  The patient denies fevers and chills.  Denies chest pain and hemoptysis.  He has her baseline shortness of breath remains on home oxygen.  Denies nausea, vomiting, constipation, diarrhea.  The patient is here for evaluation prior to cycle #6 of his treatment.  MEDICAL HISTORY: Past Medical History:  Diagnosis Date  . Adenocarcinoma of left lung, stage 3 ([facility]) 05/10/2017  . Aortic atherosclerosis ([facility]) 07/31/2017  . Asthma   . Chronic diastolic CHF (congestive heart failure) ([facility])   . Chronic fatigue 08/02/2017  . Lung mass   . Non-small cell lung cancer ([facility]) 11/13/2015  . Obesity   . Perforated bowel ([facility])     ALLERGIES:  is allergic to bee venom and penicillins.  MEDICATIONS:  Current Outpatient Medications  Medication Sig Dispense Refill  . aspirin EC 81 MG EC tablet  Take 1 tablet (81 mg total) by mouth daily. 30 tablet 1  . enoxaparin (LOVENOX) 150 MG/ML injection Inject 0.9 mLs (135 mg total) into the skin daily. 30 Syringe 1  . fluticasone furoate-vilanterol (BREO ELLIPTA) 200-25 MCG/INH AEPB Inhale 1 puff into the lungs daily. 1 each 5  . furosemide (LASIX) 40 MG tablet Take 1 tablet (40 mg total) by mouth daily. 90 tablet 3  . lidocaine-prilocaine (EMLA) cream Apply 1 application topically as needed. Apply small amount to port site 1-1.5 hours before treatment. Cover with plastic wrap. 30 g 0  . OXYGEN Inhale 3 L into the lungs continuous.    . potassium chloride SA (KLOR-CON M20) 20 MEQ tablet Take 1 tablet (20 mEq total) by mouth daily. 30 tablet 6   No current facility-administered medications for this visit.     SURGICAL HISTORY:  Past Surgical History:  Procedure Laterality Date  . CHOLECYSTECTOMY    . COLONOSCOPY N/A 07/31/2016   Procedure: COLONOSCOPY;  Surgeon: Danie Binder, MD;  Location: [facility];  Service: Endoscopy;  Laterality: N/A;  1:45 pm  . ESOPHAGOGASTRODUODENOSCOPY N/A 07/31/2016   Procedure: ESOPHAGOGASTRODUODENOSCOPY (EGD);  Surgeon: Danie Binder, MD;  Location: [facility];  Service: Endoscopy;  Laterality: N/A;  . EUS N/A 06/07/2017   Procedure: UPPER ENDOSCOPIC ULTRASOUND (EUS) LINEAR;  Surgeon: Milus Banister, MD;  Location: [facility];  Service: Endoscopy;  Laterality: N/A;  . HERNIA REPAIR    . IR FLUORO GUIDE PORT INSERTION RIGHT  07/11/2017  . IR US GUIDE VASC  ACCESS RIGHT  07/11/2017    REVIEW OF SYSTEMS:   Review of Systems  Constitutional: Negative for appetite change, chills, fever and unexpected weight change. Positive for fatigue. HENT:   Negative for mouth sores, nosebleeds, sore throat and trouble swallowing.   Eyes: Negative for eye problems and icterus.  Respiratory: Negative for cough, hemoptysis, and wheezing.  He has baseline shortness of breath and continues on home oxygen. Cardiovascular:  Negative for chest pain. He has ongoing lower extremity edema which is stable. Gastrointestinal: Negative for abdominal pain, constipation, diarrhea, nausea and vomiting.  Genitourinary: Negative for bladder incontinence, difficulty urinating, dysuria, frequency and hematuria.   Musculoskeletal: Negative for back pain, gait problem, neck pain and neck stiffness.  Skin: Negative for itching and rash.  Neurological: Negative for dizziness, extremity weakness, gait problem, headaches, light-headedness and seizures.  Hematological: Negative for adenopathy. Does not bruise/bleed easily.  Psychiatric/Behavioral: Negative for confusion, depression and sleep disturbance. The patient is not nervous/anxious.     PHYSICAL EXAMINATION:  Blood pressure 111/69, pulse 95, temperature 97.7 F (36.5 C), resp. rate 18, height 5\' 6"  (1.676 m), weight 200 lb 14.4 oz (91.1 kg), SpO2 95 %.  ECOG PERFORMANCE STATUS: 1 - Symptomatic but completely ambulatory  Physical Exam  Constitutional: Oriented to person, place, and time and well-developed, well-nourished, and in no distress. No distress.  HENT:  Head: Normocephalic and atraumatic.  Mouth/Throat: Oropharynx is clear and moist. No oropharyngeal exudate.  Eyes: Conjunctivae are normal. Right eye exhibits no discharge. Left eye exhibits no discharge. No scleral icterus.  Neck: Normal range of motion. Neck supple.  Cardiovascular: Normal rate, regular rhythm, normal heart sounds and intact distal pulses.  1+ bilateral lower extremity edema. Pulmonary/Chest: Effort normal and breath sounds normal. No respiratory distress. No wheezes. No rales.  Abdominal: Soft. Bowel sounds are normal. Exhibits no distension and no mass. There is no tenderness.  Musculoskeletal: Normal range of motion. Exhibits no edema.  Lymphadenopathy:    No cervical adenopathy.  Neurological: Alert and oriented to person, place, and time. Exhibits normal muscle tone. Gait normal.  Coordination normal.  Skin: Skin is warm and dry. No rash noted. Not diaphoretic. No erythema. No pallor.  Psychiatric: Mood, memory and judgment normal.  Vitals reviewed.  LABORATORY DATA: Lab Results  Component Value Date   WBC 3.7 (L) 2020-02-2617   HGB 10.8 (L) 2020-02-2617   HCT 35.6 (L) 2020-02-2617   MCV 84.8 2020-02-2617   PLT 163 2020-02-2617      Chemistry      Component Value Date/Time   NA 139 2020-02-2617 1313   K 3.8 2020-02-2617 1313   CL 102 08/01/2017 0413   CO2 27 2020-02-2617 1313   BUN 9.8 2020-02-2617 1313   CREATININE 0.9 2020-02-2617 1313      Component Value Date/Time   CALCIUM 8.6 2020-02-2617 1313   ALKPHOS 57 2020-02-2617 1313   AST 9 2020-02-2617 1313   ALT 6 2020-02-2617 1313   BILITOT 0.23 2020-02-2617 1313       RADIOGRAPHIC STUDIES:  No results found.   ASSESSMENT/PLAN:  Adenocarcinoma of left lung, stage 3 ([facility]) This is a very pleasant 61 year old African-American male recently diagnosed with a stage IIIa non-small cell lung cancer, adenocarcinoma presented with left lower lobe lung mass in addition to mediastinal lymphadenopathy.  The patient underwent a course of concurrent chemoradiation with weekly carboplatin and paclitaxel status post 7 cycles and tolerated his treatment fairly well. He is currently undergoing treatment with consolidation immunotherapy with  Imfinzi (Durvalumab) status post5cycles.  He continues to tolerate the treatment well with no significant adverse effects. He will proceed with cycle #6 today as a scheduled.  He will have a restaging CT scan of the chest prior to his next visit. He was reminded to take his Lasix as prescribed. For the history of pulmonary embolism, he will continue his current treatment with Lovenox.    The patient will have a follow-up visit in 2 weeks for evaluation prior to cycle #7 of treatment and to review his restaging CT scan results.  The patient voices understanding of current disease status and treatment  options and is in agreement with the current care plan. All questions were answered. The patient knows to call the clinic with any problems, questions or concerns. We can certainly see the patient much sooner if necessary.  Pancreatic mass The patient had fullness in his pancreas on prior PET scan which was hypermetabolic.  He had an EUS which did not show a focal abnormality.  It was recommended that repeat imaging of this area happens within 6-12 months.  The patient will have a CT of the abdomen and pelvis prior to his next visit to ensure stability of this area.  Orders Placed This Encounter  Procedures  . CT CHEST W CONTRAST    Standing Status:   Future    Standing Expiration Date:   12-04-2017    Order Specific Question:   If indicated for the ordered procedure, I authorize the administration of contrast media per Radiology protocol    Answer:   Yes    Order Specific Question:   Preferred imaging location?    Answer:   [facility]    Order Specific Question:   Radiology Contrast Protocol - do NOT remove file path    Answer:   [email or web address]    Order Specific Question:   Reason for Exam additional comments    Answer:   lung cancer. Restaging.  Marland Kitchen CT ABDOMEN PELVIS W CONTRAST    Standing Status:   Future    Standing Expiration Date:   12-04-2017    Order Specific Question:   If indicated for the ordered procedure, I authorize the administration of contrast media per Radiology protocol    Answer:   Yes    Order Specific Question:   Preferred imaging location?    Answer:   [facility]    Order Specific Question:   Radiology Contrast Protocol - do NOT remove file path    Answer:   [email or web address]    Order Specific Question:   Reason for Exam additional comments    Answer:   Pancreatic mass seen on prior PET and MRI. F/U scan to assess stability.     Mikey Bussing, DNP, AGPCNP-BC,  AOCNP 10/26/17

## 2017-10-25 NOTE — Patient Instructions (Signed)
[facility] Discharge Instructions for Patients Receiving Chemotherapy  Today you received the following chemotherapy agents :  Imfinzi.  To help prevent nausea and vomiting after your treatment, we encourage you to take your nausea medication as prescribed.   If you develop nausea and vomiting that is not controlled by your nausea medication, call the clinic.   BELOW ARE SYMPTOMS THAT SHOULD BE REPORTED IMMEDIATELY:  *FEVER GREATER THAN 100.5 F  *CHILLS WITH OR WITHOUT FEVER  NAUSEA AND VOMITING THAT IS NOT CONTROLLED WITH YOUR NAUSEA MEDICATION  *UNUSUAL SHORTNESS OF BREATH  *UNUSUAL BRUISING OR BLEEDING  TENDERNESS IN MOUTH AND THROAT WITH OR WITHOUT PRESENCE OF ULCERS  *URINARY PROBLEMS  *BOWEL PROBLEMS  UNUSUAL RASH Items with * indicate a potential emergency and should be followed up as soon as possible.  Feel free to call the clinic should you have any questions or concerns. The clinic phone number is [phone].  Please show the [facility] at check-in to the Emergency Department and triage nurse.

## 2017-10-25 NOTE — Telephone Encounter (Signed)
Appts already scheduled per 12/27 los. - no additional appts to add.

## 2017-10-26 ENCOUNTER — Encounter: Payer: Self-pay | Admitting: Oncology

## 2017-10-26 NOTE — Assessment & Plan Note (Signed)
This is a very pleasant 61 year old African-American male recently diagnosed with a stage IIIa non-small cell lung cancer, adenocarcinoma presented with left lower lobe lung mass in addition to mediastinal lymphadenopathy.  The patient underwent a course of concurrent chemoradiation with weekly carboplatin and paclitaxel status post 7 cycles and tolerated his treatment fairly well. He is currently undergoing treatment with consolidation immunotherapy with Imfinzi (Durvalumab) status post5cycles.  He continues to tolerate the treatment well with no significant adverse effects. He will proceed with cycle #6 today as a scheduled.  He will have a restaging CT scan of the chest prior to his next visit. He was reminded to take his Lasix as prescribed. For the history of pulmonary embolism, he will continue his current treatment with Lovenox.    The patient will have a follow-up visit in 2 weeks for evaluation prior to cycle #7 of treatment and to review his restaging CT scan results.  The patient voices understanding of current disease status and treatment options and is in agreement with the current care plan. All questions were answered. The patient knows to call the clinic with any problems, questions or concerns. We can certainly see the patient much sooner if necessary.

## 2017-10-26 NOTE — Assessment & Plan Note (Signed)
The patient had fullness in his pancreas on prior PET scan which was hypermetabolic.  He had an EUS which did not show a focal abnormality.  It was recommended that repeat imaging of this area happens within 6-12 months.  The patient will have a CT of the abdomen and pelvis prior to his next visit to ensure stability of this area.

## 2017-11-06 ENCOUNTER — Encounter (HOSPITAL_COMMUNITY): Payer: Self-pay | Admitting: Radiology

## 2017-11-06 ENCOUNTER — Ambulatory Visit (HOSPITAL_COMMUNITY)
Admission: RE | Admit: 2017-11-06 | Discharge: 2017-11-06 | Disposition: A | Discharge status: Home / Self Care | Payer: Medicare Other | Source: Ambulatory Visit | Attending: Oncology | Admitting: Oncology

## 2017-11-06 DIAGNOSIS — J90 Pleural effusion, not elsewhere classified: Secondary | ICD-10-CM | POA: Insufficient documentation

## 2017-11-06 DIAGNOSIS — C3490 Malignant neoplasm of unspecified part of unspecified bronchus or lung: Secondary | ICD-10-CM | POA: Diagnosis not present

## 2017-11-06 DIAGNOSIS — J700 Acute pulmonary manifestations due to radiation: Secondary | ICD-10-CM | POA: Insufficient documentation

## 2017-11-06 DIAGNOSIS — I700 Atherosclerosis of aorta: Secondary | ICD-10-CM | POA: Insufficient documentation

## 2017-11-06 DIAGNOSIS — K869 Disease of pancreas, unspecified: Secondary | ICD-10-CM | POA: Diagnosis not present

## 2017-11-06 DIAGNOSIS — C3432 Malignant neoplasm of lower lobe, left bronchus or lung: Secondary | ICD-10-CM | POA: Insufficient documentation

## 2017-11-06 DIAGNOSIS — K8689 Other specified diseases of pancreas: Secondary | ICD-10-CM

## 2017-11-06 DIAGNOSIS — J439 Emphysema, unspecified: Secondary | ICD-10-CM | POA: Diagnosis not present

## 2017-11-06 MED ORDER — IOPAMIDOL (ISOVUE-300) INJECTION 61%
INTRAVENOUS | Status: AC
  Administered 2017-11-06: 100 mL via INTRAVENOUS
  Filled 2017-11-06: qty 100

## 2017-11-07 ENCOUNTER — Inpatient Hospital Stay: Payer: Medicare Other

## 2017-11-07 ENCOUNTER — Encounter: Payer: Self-pay | Admitting: Oncology

## 2017-11-07 ENCOUNTER — Inpatient Hospital Stay (HOSPITAL_BASED_OUTPATIENT_CLINIC_OR_DEPARTMENT_OTHER): Payer: Medicare Other | Admitting: Oncology

## 2017-11-07 ENCOUNTER — Inpatient Hospital Stay: Payer: Medicare Other | Attending: Internal Medicine

## 2017-11-07 ENCOUNTER — Encounter: Payer: Medicare Other | Admitting: Nutrition

## 2017-11-07 ENCOUNTER — Other Ambulatory Visit: Payer: Self-pay

## 2017-11-07 VITALS — BP 128/69 | HR 114 | Temp 98.0°F | Resp 16 | Ht 66.0 in | Wt 201.5 lb

## 2017-11-07 VITALS — HR 90

## 2017-11-07 DIAGNOSIS — J90 Pleural effusion, not elsewhere classified: Secondary | ICD-10-CM | POA: Insufficient documentation

## 2017-11-07 DIAGNOSIS — G252 Other specified forms of tremor: Secondary | ICD-10-CM | POA: Diagnosis not present

## 2017-11-07 DIAGNOSIS — C3432 Malignant neoplasm of lower lobe, left bronchus or lung: Secondary | ICD-10-CM

## 2017-11-07 DIAGNOSIS — C3492 Malignant neoplasm of unspecified part of left bronchus or lung: Secondary | ICD-10-CM

## 2017-11-07 DIAGNOSIS — Z86711 Personal history of pulmonary embolism: Secondary | ICD-10-CM

## 2017-11-07 DIAGNOSIS — Z7901 Long term (current) use of anticoagulants: Secondary | ICD-10-CM

## 2017-11-07 DIAGNOSIS — Z5112 Encounter for antineoplastic immunotherapy: Secondary | ICD-10-CM

## 2017-11-07 DIAGNOSIS — Z9981 Dependence on supplemental oxygen: Secondary | ICD-10-CM | POA: Insufficient documentation

## 2017-11-07 DIAGNOSIS — J9610 Chronic respiratory failure, unspecified whether with hypoxia or hypercapnia: Secondary | ICD-10-CM | POA: Insufficient documentation

## 2017-11-07 DIAGNOSIS — R5382 Chronic fatigue, unspecified: Secondary | ICD-10-CM

## 2017-11-07 DIAGNOSIS — K8689 Other specified diseases of pancreas: Secondary | ICD-10-CM

## 2017-11-07 LAB — CBC WITH DIFFERENTIAL/PLATELET
ABS GRANULOCYTE: 3.3 10*3/uL (ref 1.5–6.5)
BASOS ABS: 0 10*3/uL (ref 0.0–0.1)
BASOS PCT: 0 %
EOS PCT: 3 %
Eosinophils Absolute: 0.1 10*3/uL (ref 0.0–0.5)
HEMATOCRIT: 35 % — AB (ref 38.4–49.9)
Hemoglobin: 10.7 g/dL — ABNORMAL LOW (ref 13.0–17.1)
Lymphocytes Relative: 19 %
Lymphs Abs: 0.9 10*3/uL (ref 0.9–3.3)
MCH: 25.5 pg — ABNORMAL LOW (ref 27.2–33.4)
MCHC: 30.6 g/dL — ABNORMAL LOW (ref 32.0–36.0)
MCV: 83.5 fL (ref 79.3–98.0)
MONO ABS: 0.4 10*3/uL (ref 0.1–0.9)
Monocytes Relative: 8 %
NEUTROS PCT: 70 %
Neutro Abs: 3.3 10*3/uL (ref 1.5–6.5)
PLATELETS: 173 10*3/uL (ref 140–400)
RBC: 4.19 MIL/uL — AB (ref 4.20–5.82)
RDW: 15.1 % (ref 11.0–15.6)
WBC: 4.8 10*3/uL (ref 4.0–10.3)

## 2017-11-07 LAB — COMPREHENSIVE METABOLIC PANEL
ALBUMIN: 2.9 g/dL — AB (ref 3.5–5.0)
ALT: 7 U/L (ref 0–55)
AST: 10 U/L (ref 5–34)
Alkaline Phosphatase: 61 U/L (ref 40–150)
Anion gap: 7 (ref 3–11)
BUN: 11 mg/dL (ref 7–26)
CO2: 29 mmol/L (ref 22–29)
CREATININE: 1.05 mg/dL (ref 0.70–1.30)
Calcium: 9 mg/dL (ref 8.4–10.4)
Chloride: 102 mmol/L (ref 98–109)
GFR calc Af Amer: >60 mL/min (ref >60)
GFR calc non Af Amer: >60 mL/min (ref >60)
GLUCOSE: 101 mg/dL (ref 70–140)
Potassium: 3.7 mmol/L (ref 3.5–5.1)
Sodium: 138 mmol/L (ref 136–145)
Total Bilirubin: <0.2 mg/dL — ABNORMAL LOW (ref 0.2–1.2)
Total Protein: 7.5 g/dL (ref 6.4–8.3)

## 2017-11-07 LAB — TSH: TSH: 0.384 u[IU]/mL (ref 0.320–4.118)

## 2017-11-07 MED ORDER — SODIUM CHLORIDE 0.9% FLUSH
10.0000 mL | Freq: Once | INTRAVENOUS | Status: AC
  Administered 2017-11-07: 10 mL
  Filled 2017-11-07: qty 10

## 2017-11-07 MED ORDER — SODIUM CHLORIDE 0.9 % IV SOLN
860.0000 mg | Freq: Once | INTRAVENOUS | Status: AC
  Administered 2017-11-07: 860 mg via INTRAVENOUS
  Filled 2017-11-07: qty 10

## 2017-11-07 MED ORDER — SODIUM CHLORIDE 0.9 % IV SOLN
Freq: Once | INTRAVENOUS | Status: AC
  Administered 2017-11-07: 14:00:00 via INTRAVENOUS

## 2017-11-07 MED ORDER — HEPARIN SOD (PORK) LOCK FLUSH 100 UNIT/ML IV SOLN
500.0000 [IU] | Freq: Once | INTRAVENOUS | Status: AC | PRN
  Administered 2017-11-07: 500 [IU]
  Filled 2017-11-07: qty 5

## 2017-11-07 MED ORDER — SODIUM CHLORIDE 0.9% FLUSH
10.0000 mL | INTRAVENOUS | Status: DC | PRN
  Administered 2017-11-07: 10 mL
  Filled 2017-11-07: qty 10

## 2017-11-07 NOTE — Assessment & Plan Note (Signed)
The patient had fullness in his pancreas on prior PET scan which was hypermetabolic.  He had an EUS which did not show a focal abnormality.  CT of the abdomen did not show any change in this area.  This will be monitored periodically.

## 2017-11-07 NOTE — Assessment & Plan Note (Signed)
This is a very pleasant 62 year old African-American male recently diagnosed with a stage IIIa non-small cell lung cancer, adenocarcinoma presented with left lower lobe lung mass in addition to mediastinal lymphadenopathy.  The patient underwent a course of concurrent chemoradiation with weekly carboplatin and paclitaxel status post 7 cycles and tolerated his treatment fairly well. He is currently undergoing treatment with consolidation immunotherapy with Imfinzi (Durvalumab) status post6cycles.  He continues to tolerate the treatment well with no significant adverse effects.  The patient was seen with Dr. Julien Nordmann.  Restaging CT scan results were discussed with the patient and his sister.  The patient has continued improvement in his disease.  Recommend that he continue Imfinzi.  He will proceed with cycle 7 today as scheduled.  He was encouraged to be more active and to restart his home medications.  For the history of pulmonary embolism, he will continue his current treatment with Lovenox.  The patient will have a follow-up visit in 2 weeks for evaluation prior to cycle #8 of treatment.  The patient voices understanding of current disease status and treatment options and is in agreement with the current care plan. All questions were answered. The patient knows to call the clinic with any problems, questions or concerns. We can certainly see the patient much sooner if necessary.

## 2017-11-07 NOTE — Patient Instructions (Signed)
[facility] Discharge Instructions for Patients Receiving Chemotherapy  Today you received the following chemotherapy agents: Imfinzi.  To help prevent nausea and vomiting after your treatment, we encourage you to take your nausea medication as directed.   If you develop nausea and vomiting that is not controlled by your nausea medication, call the clinic.   BELOW ARE SYMPTOMS THAT SHOULD BE REPORTED IMMEDIATELY:  *FEVER GREATER THAN 100.5 F  *CHILLS WITH OR WITHOUT FEVER  NAUSEA AND VOMITING THAT IS NOT CONTROLLED WITH YOUR NAUSEA MEDICATION  *UNUSUAL SHORTNESS OF BREATH  *UNUSUAL BRUISING OR BLEEDING  TENDERNESS IN MOUTH AND THROAT WITH OR WITHOUT PRESENCE OF ULCERS  *URINARY PROBLEMS  *BOWEL PROBLEMS  UNUSUAL RASH Items with * indicate a potential emergency and should be followed up as soon as possible.  Feel free to call the clinic should you have any questions or concerns. The clinic phone number is [phone].  Please show the CHEMO ALERT CARD at check-in to the Emergency Department and triage nurse.   

## 2017-11-07 NOTE — Progress Notes (Signed)
[facility] OFFICE PROGRESS NOTE  [name], MD [address] [place] [address]  DIAGNOSIS: Stage IIIA (T2a, N2, M0) non-small cell lung cancer, adenocarcinoma presented with left lower lobe lung mass in addition to mediastinal lymphadenopathy diagnosed in June 2018. The patient also has suspicious soft tissue density in the pancreatic head but this has been present for 3 years on previous CT scan and unlikely to be related to [name], last dose was given 07/03/2017.  CURRENT THERAPY: Consolidation treatment with immunotherapy with Imfinzi (Durvalumab) 10 MG/KG every 2 weeks, first dose 08/15/2017. Status post6cycles.  INTERVAL HISTORY: James Zamora 62 y.o. male returns for routine follow-up visit accompanied by his sister.  The patient is feeling fine today has no specific complaints.  The patient's sister tells me that he has stopped all of his oral medications.  He is still receiving his Lovenox since family members administer this to him.  The patient does not give any reason for stopping his medications.  His appetite remains good and his weight remains stable.  He feels the lower extremity edema is stable.  He denies fevers and chills.  Denies chest pain and hemoptysis.  He has his baseline shortness of breath and remains on home oxygen.  Denies nausea, vomiting, constipation, diarrhea.  The patient is here for evaluation prior to cycle #7 of his treatment and to review his restaging CT scan results.  MEDICAL HISTORY: Past Medical History:  Diagnosis Date  . Adenocarcinoma of left lung, stage 3 ([place]) 05/10/2017  . Aortic atherosclerosis ([place]) 07/31/2017  . Asthma   . Chronic diastolic CHF (congestive heart failure) ([place])   . Chronic fatigue 08/02/2017  . Lung mass   . Non-small cell lung cancer  ([place]) 11/13/2015  . Obesity   . Perforated bowel ([place])     ALLERGIES:  is allergic to bee venom and penicillins.  MEDICATIONS:  Current Outpatient Medications  Medication Sig Dispense Refill  . enoxaparin (LOVENOX) 150 MG/ML injection Inject 0.9 mLs (135 mg total) into the skin daily. 30 Syringe 1  . lidocaine-prilocaine (EMLA) cream Apply 1 application topically as needed. Apply small amount to port site 1-1.5 hours before treatment. Cover with plastic wrap. 30 g 0  . OXYGEN Inhale 3 L into the lungs continuous.    Marland Kitchen aspirin EC 81 MG EC tablet Take 1 tablet (81 mg total) by mouth daily. (Patient not taking: Reported on 11/07/2017) 30 tablet 1  . fluticasone furoate-vilanterol (BREO ELLIPTA) 200-25 MCG/INH AEPB Inhale 1 puff into the lungs daily. (Patient not taking: Reported on 11/07/2017) 1 each 5  . furosemide (LASIX) 40 MG tablet Take 1 tablet (40 mg total) by mouth daily. (Patient not taking: Reported on 11/07/2017) 90 tablet 3  . potassium chloride SA (KLOR-CON M20) 20 MEQ tablet Take 1 tablet (20 mEq total) by mouth daily. 30 tablet 6   No current facility-administered medications for this visit.    Facility-Administered Medications Ordered in Other Visits  Medication Dose Route Frequency Provider Last Rate Last Dose  . sodium chloride flush (NS) 0.9 % injection 10 mL  10 mL Intracatheter PRN Curt Bears, MD   10 mL at 11/07/17 1624    SURGICAL HISTORY:  Past Surgical History:  Procedure Laterality Date  . CHOLECYSTECTOMY    . COLONOSCOPY N/A 07/31/2016  Procedure: COLONOSCOPY;  Surgeon: Danie Binder, MD;  Location: [facility];  Service: Endoscopy;  Laterality: N/A;  1:45 pm  . ESOPHAGOGASTRODUODENOSCOPY N/A 07/31/2016   Procedure: ESOPHAGOGASTRODUODENOSCOPY (EGD);  Surgeon: Danie Binder, MD;  Location: [facility];  Service: Endoscopy;  Laterality: N/A;  . EUS N/A 06/07/2017   Procedure: UPPER ENDOSCOPIC ULTRASOUND (EUS) LINEAR;  Surgeon: Milus Banister, MD;  Location:  [facility];  Service: Endoscopy;  Laterality: N/A;  . HERNIA REPAIR    . IR FLUORO GUIDE PORT INSERTION RIGHT  07/11/2017  . IR US GUIDE VASC ACCESS RIGHT  07/11/2017    REVIEW OF SYSTEMS:   Review of Systems  Constitutional: Negative for appetite change, chills, fatigue, fever and unexpected weight change.  HENT:   Negative for mouth sores, nosebleeds, sore throat and trouble swallowing.   Eyes: Negative for eye problems and icterus.  Respiratory: Negative for cough, hemoptysis, and wheezing.  He has his baseline shortness of breath and wears home oxygen. Cardiovascular: Negative for chest pain. Positive for lower extremity edema. Gastrointestinal: Negative for abdominal pain, constipation, diarrhea, nausea and vomiting.  Genitourinary: Negative for bladder incontinence, difficulty urinating, dysuria, frequency and hematuria.   Musculoskeletal: Negative for back pain, gait problem, neck pain and neck stiffness.  Skin: Negative for itching and rash.  Neurological: Negative for dizziness, extremity weakness, gait problem, headaches, light-headedness and seizures.  Hematological: Negative for adenopathy. Does not bruise/bleed easily.  Psychiatric/Behavioral: Negative for confusion, depression and sleep disturbance. The patient is not nervous/anxious.     PHYSICAL EXAMINATION:  Blood pressure 128/69, pulse (!) 114, temperature 98 F (36.7 C), temperature source Oral, resp. rate 16, height 5\' 6"  (1.676 m), weight 201 lb 8 oz (91.4 kg), SpO2 91 %.  ECOG PERFORMANCE STATUS: 1 - Symptomatic but completely ambulatory  Physical Exam  Constitutional: Oriented to person, place, and time and well-developed, well-nourished, and in no distress. No distress.  HENT:  Head: Normocephalic and atraumatic.  Mouth/Throat: Oropharynx is clear and moist. No oropharyngeal exudate.  Eyes: Conjunctivae are normal. Right eye exhibits no discharge. Left eye exhibits no discharge. No scleral icterus.  Neck:  Normal range of motion. Neck supple.  Cardiovascular: Normal rate, regular rhythm, normal heart sounds and intact distal pulses.   Pulmonary/Chest: Effort normal and breath sounds normal. No respiratory distress. No wheezes. No rales.  Abdominal: Soft. Bowel sounds are normal. Exhibits no distension and no mass. There is no tenderness.  Musculoskeletal: Normal range of motion. Exhibits no edema.  Lymphadenopathy:    No cervical adenopathy.  Neurological: Alert and oriented to person, place, and time. Exhibits normal muscle tone. Gait normal. Coordination normal.  Skin: Skin is warm and dry. No rash noted. Not diaphoretic. No erythema. No pallor.  Psychiatric: Mood, memory and judgment normal.  Vitals reviewed.  LABORATORY DATA: Lab Results  Component Value Date   WBC 4.8 11/07/2017   HGB 10.7 (L) 11/07/2017   HCT 35.0 (L) 11/07/2017   MCV 83.5 11/07/2017   PLT 173 11/07/2017      Chemistry      Component Value Date/Time   NA 138 11/07/2017 1300   NA 139 2020-09-2017 1313   K 3.7 11/07/2017 1300   K 3.8 2020-09-2017 1313   CL 102 11/07/2017 1300   CO2 29 11/07/2017 1300   CO2 27 2020-09-2017 1313   BUN 11 11/07/2017 1300   BUN 9.8 2020-09-2017 1313   CREATININE 1.05 11/07/2017 1300   CREATININE 0.9 2020-09-2017 1313  Component Value Date/Time   CALCIUM 9.0 11/07/2017 1300   CALCIUM 8.6 March 11, 202018 1313   ALKPHOS 61 11/07/2017 1300   ALKPHOS 57 March 11, 202018 1313   AST 10 11/07/2017 1300   AST 9 March 11, 202018 1313   ALT 7 11/07/2017 1300   ALT 6 March 11, 202018 1313   BILITOT <0.2 (L) 11/07/2017 1300   BILITOT 0.23 March 11, 202018 1313       RADIOGRAPHIC STUDIES:  Ct Chest W Contrast  Result Date: 11/06/2017 CLINICAL DATA:  Non-small cell lung cancer restaging and assessment of response to therapy. Fullness of the pancreas but without lesion seen at endoscopic ultrasound. EXAM: CT CHEST, ABDOMEN, AND PELVIS WITH CONTRAST TECHNIQUE: Multidetector CT imaging of the chest, abdomen and  pelvis was performed following the standard protocol during bolus administration of intravenous contrast. CONTRAST:  117mL ISOVUE-300 IOPAMIDOL (ISOVUE-300) INJECTION 61% COMPARISON:  Multiple exams, including 03/09/2017 and 07/30/2017 FINDINGS: CT CHEST FINDINGS Cardiovascular: Right Port-A-Cath tip: Cavoatrial junction. Atherosclerotic calcification of the aortic arch. Mediastinum/Nodes: Stable 9 mm left thyroid nodule. Lateral AP window lymph node 1.0 cm in short axis on image 21/2, formerly 0.7 cm. The previously hypermetabolic AP window lymph node measures 1.3 cm in short axis on image 22/2, and previously measured 1.1 cm by my measurements on 07/30/2017. Other small scattered mediastinal lymph nodes are present along with small bilateral axillary lymph nodes. No obvious residual large pulmonary embolus although today's exam was not performed as CT angiogram. Lungs/Pleura: New small to moderate left pleural effusion without obvious soft tissue mass along the pleural margin. The previous 3.5 cm left lower lobe mass is felt to be smaller from a subjective standpoint, but is surrounded by atelectatic lung and possible radiation pneumonitis and accordingly is difficult to measure directly. My best estimate would be about 2.5 by 2.1 cm on image 76/4. Mild bilateral airway thickening. Emphysema noted. Accentuated density in portions of the right upper lobe likely primarily related to radiation port. Musculoskeletal: Thoracic spondylosis. Chronically stable lucency along the sternal manubrium at the right manubriosternal articulation, no change from 2011 and felt to be benign. CT ABDOMEN PELVIS FINDINGS Hepatobiliary: Cholecystectomy.  Otherwise unremarkable. Pancreas: The subtle right-sided prominence of the pancreatic head seems to be chronic and likely incidental. No current findings of pancreatitis. Spleen: 4 mm nonspecific hypodense lesion in the upper spleen, image 49/2, not appreciably changed from prior MRI  and technically too small to characterize. Adrenals/Urinary Tract: Chronically lobular appearance of the left adrenal gland, not appreciably changed from 2,011 and considered benign. No significant renal abnormality observed. Stomach/Bowel: Mild prominence of stool in the rectal vault, otherwise unremarkable. Vascular/Lymphatic: Aortoiliac atherosclerotic vascular disease. Right external iliac node 1.0 cm in short axis and left external iliac lymph node 0.9 cm in short axis, also there is a separate left external iliac lymph node measuring 1.2 cm in short axis. These nodes have been chronically prominent, and or significantly more prominent back on 12/31/2009. There are also some mildly notable inguinal lymph nodes including a 1.4 cm left inguinal lymph node on image 112/2. These pelvic lymph nodes have not been hypermetabolic on prior PET-CT. Reproductive: Unremarkable Other: Subtle presacral stranding. Musculoskeletal: Grade 1 degenerative anterolisthesis at L5-S1 with associated disc bulge and disc uncovering contributing to bilateral foraminal impingement at this level. IMPRESSION: 1. Reduced size of the left lower lobe mass, currently estimated at about 2.5 by 2.1 cm, with surrounding radiation pneumonitis and volume loss. There is also a new left pleural effusion without findings of tumor along the pleural  effusion margins. There has been some mild interval enlargement of the AP window lymph nodes. Some of this could be reactive given the inflammatory findings in the left lung, but in particular the more medial node was previously hypermetabolic on the PET-CT of 03/09/2017 and accordingly this enlargement could be related to malignancy. 2. Emphysema with mild bilateral airway thickening. 3. Chronic subtle right-sided prominence of the pancreatic head, with focal hypermetabolic activity on prior PET-CT but otherwise negative workups since that time including abdominal MRI and endoscopic ultrasound. The  significance is uncertain but there is no specific worrisome finding on today's exam, surveillance of this region is likely prudent. 4. Other imaging findings of potential clinical significance: Aortic Atherosclerosis (ICD10-I70.0) and Emphysema (ICD10-J43.9). Mild prominence of stool in the rectal vault. Chronic mild pelvic adenopathy has not previously been hypermetabolic. Impingement at L5-S1. Airway thickening is present, suggesting bronchitis or reactive airways disease. Electronically Signed   By: Van Clines M.D.   On: 11/06/2017 17:47   Ct Abdomen Pelvis W Contrast  Result Date: 11/06/2017 CLINICAL DATA:  Non-small cell lung cancer restaging and assessment of response to therapy. Fullness of the pancreas but without lesion seen at endoscopic ultrasound. EXAM: CT CHEST, ABDOMEN, AND PELVIS WITH CONTRAST TECHNIQUE: Multidetector CT imaging of the chest, abdomen and pelvis was performed following the standard protocol during bolus administration of intravenous contrast. CONTRAST:  164mL ISOVUE-300 IOPAMIDOL (ISOVUE-300) INJECTION 61% COMPARISON:  Multiple exams, including 03/09/2017 and 07/30/2017 FINDINGS: CT CHEST FINDINGS Cardiovascular: Right Port-A-Cath tip: Cavoatrial junction. Atherosclerotic calcification of the aortic arch. Mediastinum/Nodes: Stable 9 mm left thyroid nodule. Lateral AP window lymph node 1.0 cm in short axis on image 21/2, formerly 0.7 cm. The previously hypermetabolic AP window lymph node measures 1.3 cm in short axis on image 22/2, and previously measured 1.1 cm by my measurements on 07/30/2017. Other small scattered mediastinal lymph nodes are present along with small bilateral axillary lymph nodes. No obvious residual large pulmonary embolus although today's exam was not performed as CT angiogram. Lungs/Pleura: New small to moderate left pleural effusion without obvious soft tissue mass along the pleural margin. The previous 3.5 cm left lower lobe mass is felt to be  smaller from a subjective standpoint, but is surrounded by atelectatic lung and possible radiation pneumonitis and accordingly is difficult to measure directly. My best estimate would be about 2.5 by 2.1 cm on image 76/4. Mild bilateral airway thickening. Emphysema noted. Accentuated density in portions of the right upper lobe likely primarily related to radiation port. Musculoskeletal: Thoracic spondylosis. Chronically stable lucency along the sternal manubrium at the right manubriosternal articulation, no change from 2011 and felt to be benign. CT ABDOMEN PELVIS FINDINGS Hepatobiliary: Cholecystectomy.  Otherwise unremarkable. Pancreas: The subtle right-sided prominence of the pancreatic head seems to be chronic and likely incidental. No current findings of pancreatitis. Spleen: 4 mm nonspecific hypodense lesion in the upper spleen, image 49/2, not appreciably changed from prior MRI and technically too small to characterize. Adrenals/Urinary Tract: Chronically lobular appearance of the left adrenal gland, not appreciably changed from 2,011 and considered benign. No significant renal abnormality observed. Stomach/Bowel: Mild prominence of stool in the rectal vault, otherwise unremarkable. Vascular/Lymphatic: Aortoiliac atherosclerotic vascular disease. Right external iliac node 1.0 cm in short axis and left external iliac lymph node 0.9 cm in short axis, also there is a separate left external iliac lymph node measuring 1.2 cm in short axis. These nodes have been chronically prominent, and or significantly more prominent back on 12/31/2009. There  are also some mildly notable inguinal lymph nodes including a 1.4 cm left inguinal lymph node on image 112/2. These pelvic lymph nodes have not been hypermetabolic on prior PET-CT. Reproductive: Unremarkable Other: Subtle presacral stranding. Musculoskeletal: Grade 1 degenerative anterolisthesis at L5-S1 with associated disc bulge and disc uncovering contributing to  bilateral foraminal impingement at this level. IMPRESSION: 1. Reduced size of the left lower lobe mass, currently estimated at about 2.5 by 2.1 cm, with surrounding radiation pneumonitis and volume loss. There is also a new left pleural effusion without findings of tumor along the pleural effusion margins. There has been some mild interval enlargement of the AP window lymph nodes. Some of this could be reactive given the inflammatory findings in the left lung, but in particular the more medial node was previously hypermetabolic on the PET-CT of 03/09/2017 and accordingly this enlargement could be related to malignancy. 2. Emphysema with mild bilateral airway thickening. 3. Chronic subtle right-sided prominence of the pancreatic head, with focal hypermetabolic activity on prior PET-CT but otherwise negative workups since that time including abdominal MRI and endoscopic ultrasound. The significance is uncertain but there is no specific worrisome finding on today's exam, surveillance of this region is likely prudent. 4. Other imaging findings of potential clinical significance: Aortic Atherosclerosis (ICD10-I70.0) and Emphysema (ICD10-J43.9). Mild prominence of stool in the rectal vault. Chronic mild pelvic adenopathy has not previously been hypermetabolic. Impingement at L5-S1. Airway thickening is present, suggesting bronchitis or reactive airways disease. Electronically Signed   By: Van Clines M.D.   On: 11/06/2017 17:47     ASSESSMENT/PLAN:  Non-small cell lung cancer ([place]) - Adenocarcinoma This is a very pleasant 62 year old African-American male recently diagnosed with a stage IIIa non-small cell lung cancer, adenocarcinoma presented with left lower lobe lung mass in addition to mediastinal lymphadenopathy.  The patient underwent a course of concurrent chemoradiation with weekly carboplatin and paclitaxel status post 7 cycles and tolerated his treatment fairly well. He is currently undergoing  treatment with consolidation immunotherapy with Imfinzi (Durvalumab) status post6cycles.  He continues to tolerate the treatment well with no significant adverse effects.  The patient was seen with Dr. Julien Nordmann.  Restaging CT scan results were discussed with the patient and his sister.  The patient has continued improvement in his disease.  Recommend that he continue Imfinzi.  He will proceed with cycle 7 today as scheduled.  He was encouraged to be more active and to restart his home medications.  For the history of pulmonary embolism, he will continue his current treatment with Lovenox.  The patient will have a follow-up visit in 2 weeks for evaluation prior to cycle #8 of treatment.  The patient voices understanding of current disease status and treatment options and is in agreement with the current care plan. All questions were answered. The patient knows to call the clinic with any problems, questions or concerns. We can certainly see the patient much sooner if necessary.  Pancreatic mass The patient had fullness in his pancreas on prior PET scan which was hypermetabolic.  He had an EUS which did not show a focal abnormality.  CT of the abdomen did not show any change in this area.  This will be monitored periodically.  No orders of the defined types were placed in this encounter.   James Bussing, DNP, AGPCNP-BC, AOCNP 11/07/17  ADDENDUM: Hematology/Oncology Attending: I had a face-to-face encounter with the patient.  I recommended his care plan.  This is a very pleasant 61 years  old African-American male with history of a stage IIIa non-small cell lung cancer status post concurrent chemoradiation with partial response and he is currently undergoing consolidation immunotherapy with Imfinzi (Durvalumab) status post 6 cycles.  The patient has been tolerating this treatment fairly well with no concerning complaints.  He had repeat CT scan of the chest, abdomen and pelvis performed  recently. Has a scan showed a stable disease with no concerning findings for disease progression.  I personally and independently reviewed the scans and discussed the results with the patient and his sister.  I recommended for him to continue his current treatment with immunotherapy and he will proceed with cycle #7 today.  The pancreatic mass seen on the previous scan is a still stable.  We will continue to monitor for now.  The patient was also advised to continue his treatment with Lovenox for the history of pulmonary embolism.   He will come back for follow-up visit in 2 weeks for evaluation before the next cycle of his treatment. He was advised to call immediately if he has any concerning symptoms in the interval.  Disclaimer: This note was dictated with voice recognition software. Similar sounding words can inadvertently be transcribed and may be missed upon review. Eilleen Kempf, MD 11/10/17

## 2017-11-21 ENCOUNTER — Inpatient Hospital Stay: Payer: Medicare Other

## 2017-11-21 ENCOUNTER — Encounter: Payer: Self-pay | Admitting: Internal Medicine

## 2017-11-21 ENCOUNTER — Telehealth: Payer: Self-pay | Admitting: Internal Medicine

## 2017-11-21 ENCOUNTER — Inpatient Hospital Stay (HOSPITAL_BASED_OUTPATIENT_CLINIC_OR_DEPARTMENT_OTHER): Payer: Medicare Other | Admitting: Internal Medicine

## 2017-11-21 VITALS — BP 102/64 | HR 115 | Temp 97.9°F | Resp 12 | Wt 194.0 lb

## 2017-11-21 DIAGNOSIS — Z7901 Long term (current) use of anticoagulants: Secondary | ICD-10-CM | POA: Diagnosis not present

## 2017-11-21 DIAGNOSIS — C3492 Malignant neoplasm of unspecified part of left bronchus or lung: Secondary | ICD-10-CM

## 2017-11-21 DIAGNOSIS — C3432 Malignant neoplasm of lower lobe, left bronchus or lung: Secondary | ICD-10-CM

## 2017-11-21 DIAGNOSIS — Z86711 Personal history of pulmonary embolism: Secondary | ICD-10-CM

## 2017-11-21 DIAGNOSIS — J90 Pleural effusion, not elsewhere classified: Secondary | ICD-10-CM | POA: Diagnosis not present

## 2017-11-21 DIAGNOSIS — G252 Other specified forms of tremor: Secondary | ICD-10-CM | POA: Diagnosis not present

## 2017-11-21 DIAGNOSIS — Z5112 Encounter for antineoplastic immunotherapy: Secondary | ICD-10-CM | POA: Diagnosis not present

## 2017-11-21 LAB — COMPREHENSIVE METABOLIC PANEL
ALBUMIN: 3.1 g/dL — AB (ref 3.5–5.0)
ALK PHOS: 70 U/L (ref 40–150)
ALT: 8 U/L (ref 0–55)
ANION GAP: 8 (ref 3–11)
AST: 10 U/L (ref 5–34)
BUN: 12 mg/dL (ref 7–26)
CALCIUM: 9.7 mg/dL (ref 8.4–10.4)
CO2: 30 mmol/L — AB (ref 22–29)
Chloride: 98 mmol/L (ref 98–109)
Creatinine, Ser: 1.09 mg/dL (ref 0.70–1.30)
GFR calc Af Amer: >60 mL/min (ref >60)
GFR calc non Af Amer: >60 mL/min (ref >60)
Glucose, Bld: 97 mg/dL (ref 70–140)
POTASSIUM: 4.3 mmol/L (ref 3.5–5.1)
Sodium: 136 mmol/L (ref 136–145)
Total Bilirubin: 0.3 mg/dL (ref 0.2–1.2)
Total Protein: 8.7 g/dL — ABNORMAL HIGH (ref 6.4–8.3)

## 2017-11-21 LAB — CBC WITH DIFFERENTIAL/PLATELET
Basophils Absolute: 0 10*3/uL (ref 0.0–0.1)
Basophils Relative: 0 %
Eosinophils Absolute: 0.2 10*3/uL (ref 0.0–0.5)
Eosinophils Relative: 2 %
HEMATOCRIT: 37.2 % — AB (ref 38.4–49.9)
Hemoglobin: 11.2 g/dL — ABNORMAL LOW (ref 13.0–17.1)
LYMPHS PCT: 20 %
Lymphs Abs: 1.3 10*3/uL (ref 0.9–3.3)
MCH: 24.6 pg — ABNORMAL LOW (ref 27.2–33.4)
MCHC: 30.1 g/dL — AB (ref 32.0–36.0)
MCV: 81.6 fL (ref 79.3–98.0)
MONO ABS: 0.5 10*3/uL (ref 0.1–0.9)
MONOS PCT: 7 %
NEUTROS ABS: 4.5 10*3/uL (ref 1.5–6.5)
Neutrophils Relative %: 71 %
Platelets: 262 10*3/uL (ref 140–400)
RBC: 4.56 MIL/uL (ref 4.20–5.82)
RDW: 15.2 % (ref 11.0–15.6)
WBC: 6.4 10*3/uL (ref 4.0–10.3)

## 2017-11-21 MED ORDER — SODIUM CHLORIDE 0.9 % IV SOLN
9.0000 mg/kg | Freq: Once | INTRAVENOUS | Status: AC
  Administered 2017-11-21: 860 mg via INTRAVENOUS
  Filled 2017-11-21: qty 10

## 2017-11-21 MED ORDER — HEPARIN SOD (PORK) LOCK FLUSH 100 UNIT/ML IV SOLN
500.0000 [IU] | Freq: Once | INTRAVENOUS | Status: AC | PRN
  Administered 2017-11-21: 500 [IU]
  Filled 2017-11-21: qty 5

## 2017-11-21 MED ORDER — SODIUM CHLORIDE 0.9 % IV SOLN
Freq: Once | INTRAVENOUS | Status: AC
  Administered 2017-11-21: 12:00:00 via INTRAVENOUS

## 2017-11-21 MED ORDER — SODIUM CHLORIDE 0.9% FLUSH
10.0000 mL | INTRAVENOUS | Status: DC | PRN
  Administered 2017-11-21: 10 mL
  Filled 2017-11-21: qty 10

## 2017-11-21 MED ORDER — SODIUM CHLORIDE 0.9% FLUSH
10.0000 mL | Freq: Once | INTRAVENOUS | Status: AC
  Administered 2017-11-21: 10 mL
  Filled 2017-11-21: qty 10

## 2017-11-21 NOTE — Patient Instructions (Signed)
[facility] Discharge Instructions for Patients Receiving Chemotherapy  Today you received the following chemotherapy agents Imfinzi.   To help prevent nausea and vomiting after your treatment, we encourage you to take your nausea medication as prescribed.  If you develop nausea and vomiting that is not controlled by your nausea medication, call the clinic.   BELOW ARE SYMPTOMS THAT SHOULD BE REPORTED IMMEDIATELY:  *FEVER GREATER THAN 100.5 F  *CHILLS WITH OR WITHOUT FEVER  NAUSEA AND VOMITING THAT IS NOT CONTROLLED WITH YOUR NAUSEA MEDICATION  *UNUSUAL SHORTNESS OF BREATH  *UNUSUAL BRUISING OR BLEEDING  TENDERNESS IN MOUTH AND THROAT WITH OR WITHOUT PRESENCE OF ULCERS  *URINARY PROBLEMS  *BOWEL PROBLEMS  UNUSUAL RASH Items with * indicate a potential emergency and should be followed up as soon as possible.  Feel free to call the clinic should you have any questions or concerns. The clinic phone number is [phone].  Please show the [facility] at check-in to the Emergency Department and triage nurse.

## 2017-11-21 NOTE — Patient Instructions (Signed)
Implanted Port Home Guide An implanted port is a type of central line that is placed under the skin. Central lines are used to provide IV access when treatment or nutrition needs to be given through a person's veins. Implanted ports are used for long-term IV access. An implanted port may be placed because:  You need IV medicine that would be irritating to the small veins in your hands or arms.  You need long-term IV medicines, such as antibiotics.  You need IV nutrition for a long period.  You need frequent blood draws for lab tests.  You need dialysis.  Implanted ports are usually placed in the chest area, but they can also be placed in the upper arm, the abdomen, or the leg. An implanted port has two main parts:  Reservoir. The reservoir is round and will appear as a small, raised area under your skin. The reservoir is the part where a needle is inserted to give medicines or draw blood.  Catheter. The catheter is a thin, flexible tube that extends from the reservoir. The catheter is placed into a large vein. Medicine that is inserted into the reservoir goes into the catheter and then into the vein.  How will I care for my incision site? Do not get the incision site wet. Bathe or shower as directed by your health care provider. How is my port accessed? Special steps must be taken to access the port:  Before the port is accessed, a numbing cream can be placed on the skin. This helps numb the skin over the port site.  Your health care provider uses a sterile technique to access the port. ? Your health care provider must put on a mask and sterile gloves. ? The skin over your port is cleaned carefully with an antiseptic and allowed to dry. ? The port is gently pinched between sterile gloves, and a needle is inserted into the port.  Only "non-coring" port needles should be used to access the port. Once the port is accessed, a blood return should be checked. This helps ensure that the port  is in the vein and is not clogged.  If your port needs to remain accessed for a constant infusion, a clear (transparent) bandage will be placed over the needle site. The bandage and needle will need to be changed every week, or as directed by your health care provider.  Keep the bandage covering the needle clean and dry. Do not get it wet. Follow your health care provider's instructions on how to take a shower or bath while the port is accessed.  If your port does not need to stay accessed, no bandage is needed over the port.  What is flushing? Flushing helps keep the port from getting clogged. Follow your health care provider's instructions on how and when to flush the port. Ports are usually flushed with saline solution or a medicine called heparin. The need for flushing will depend on how the port is used.  If the port is used for intermittent medicines or blood draws, the port will need to be flushed: ? After medicines have been given. ? After blood has been drawn. ? As part of routine maintenance.  If a constant infusion is running, the port may not need to be flushed.  How long will my port stay implanted? The port can stay in for as long as your health care provider thinks it is needed. When it is time for the port to come out, surgery will be   done to remove it. The procedure is similar to the one performed when the port was put in. When should I seek immediate medical care? When you have an implanted port, you should seek immediate medical care if:  You notice a bad smell coming from the incision site.  You have swelling, redness, or drainage at the incision site.  You have more swelling or pain at the port site or the surrounding area.  You have a fever that is not controlled with medicine.  This information is not intended to replace advice given to you by your health care provider. Make sure you discuss any questions you have with your health care provider. Document  Released: 10/16/2005 Document Revised: 03/23/2016 Document Reviewed: 06/23/2013 Elsevier Interactive Patient Education  2017 [employer] Inc[employer]  

## 2017-11-21 NOTE — Progress Notes (Signed)
[place] [phone]   [phone]  OFFICE PROGRESS NOTE  [name], MD [address] [place] [address]  DIAGNOSIS: Stage IIIA (T2a, N2, M0) non-small cell lung cancer, adenocarcinoma presented with left lower lobe lung mass in addition to mediastinal lymphadenopathy diagnosed in June 2018. The patient also has suspicious soft tissue density in the pancreatic head but this has been present for 3 years on previous CT scan and unlikely to be related to [name], last dose was given 07/03/2017.  CURRENT THERAPY: Consolidation treatment with immunotherapy with Imfinzi (Durvalumab) 10 MG/KG every 2 weeks, first dose 08/15/2017.  Status post 7 cycles.  INTERVAL HISTORY: James Zamora 62 y.o. male returns to the clinic today for follow-up visit accompanied by his sister.  The patient is feeling fine today with no specific complaints except for tremor of the left hand started a week ago.  He denied having any current chest pain, cough or hemoptysis but continues to have shortness of breath at baseline and currently on home oxygen.  He denied having any fever or chills.  He has no nausea, vomiting, diarrhea or constipation.  The patient denied having any significant weight loss or night sweats.  He is here for evaluation before starting cycle #8 of his treatment.   MEDICAL HISTORY: Past Medical History:  Diagnosis Date  . Adenocarcinoma of left lung, stage 3 ([place]) 05/10/2017  . Aortic atherosclerosis ([place]) 07/31/2017  . Asthma   . Chronic diastolic CHF (congestive heart failure) ([place])   . Chronic fatigue 08/02/2017  . Lung mass   . Non-small cell lung cancer ([place]) 11/13/2015  . Obesity   . Perforated bowel ([place])     ALLERGIES:  is allergic to bee venom and  penicillins.  MEDICATIONS:  Current Outpatient Medications  Medication Sig Dispense Refill  . aspirin EC 81 MG EC tablet Take 1 tablet (81 mg total) by mouth daily. (Patient not taking: Reported on 11/07/2017) 30 tablet 1  . enoxaparin (LOVENOX) 150 MG/ML injection Inject 0.9 mLs (135 mg total) into the skin daily. 30 Syringe 1  . fluticasone furoate-vilanterol (BREO ELLIPTA) 200-25 MCG/INH AEPB Inhale 1 puff into the lungs daily. (Patient not taking: Reported on 11/07/2017) 1 each 5  . furosemide (LASIX) 40 MG tablet Take 1 tablet (40 mg total) by mouth daily. (Patient not taking: Reported on 11/07/2017) 90 tablet 3  . lidocaine-prilocaine (EMLA) cream Apply 1 application topically as needed. Apply small amount to port site 1-1.5 hours before treatment. Cover with plastic wrap. 30 g 0  . OXYGEN Inhale 3 L into the lungs continuous.    . potassium chloride SA (KLOR-CON M20) 20 MEQ tablet Take 1 tablet (20 mEq total) by mouth daily. 30 tablet 6   No current facility-administered medications for this visit.     SURGICAL HISTORY:  Past Surgical History:  Procedure Laterality Date  . CHOLECYSTECTOMY    . COLONOSCOPY N/A 07/31/2016   Procedure: COLONOSCOPY;  Surgeon: Danie Binder, MD;  Location: [facility];  Service: Endoscopy;  Laterality: N/A;  1:45 pm  . ESOPHAGOGASTRODUODENOSCOPY N/A 07/31/2016   Procedure: ESOPHAGOGASTRODUODENOSCOPY (EGD);  Surgeon: Danie Binder, MD;  Location: [facility];  Service: Endoscopy;  Laterality: N/A;  . EUS N/A 06/07/2017   Procedure: UPPER ENDOSCOPIC ULTRASOUND (EUS)  LINEAR;  Surgeon: Milus Banister, MD;  Location: [facility];  Service: Endoscopy;  Laterality: N/A;  . HERNIA REPAIR    . IR FLUORO GUIDE PORT INSERTION RIGHT  07/11/2017  . IR US GUIDE VASC ACCESS RIGHT  07/11/2017    REVIEW OF SYSTEMS:  A comprehensive review of systems was negative except for: Constitutional: positive for fatigue Respiratory: positive for dyspnea on  exertion Neurological: positive for tremors   PHYSICAL EXAMINATION: General appearance: alert, cooperative, fatigued and no distress Head: Normocephalic, without obvious abnormality, atraumatic Neck: no adenopathy, no JVD, supple, symmetrical, trachea midline and thyroid not enlarged, symmetric, no tenderness/mass/nodules Lymph nodes: Cervical, supraclavicular, and axillary nodes normal. Resp: clear to auscultation bilaterally Back: symmetric, no curvature. ROM normal. No CVA tenderness. Cardio: regular rate and rhythm, S1, S2 normal, no murmur, click, rub or gallop GI: soft, non-tender; bowel sounds normal; no masses,  no organomegaly Extremities: edema 1+  ECOG PERFORMANCE STATUS: 1 - Symptomatic but completely ambulatory  Blood pressure 102/64, pulse (!) 115, temperature 97.9 F (36.6 C), temperature source Oral, resp. rate 12, weight 194 lb (88 kg), SpO2 94 %.  LABORATORY DATA: Lab Results  Component Value Date   WBC 6.4 11/21/2017   HGB 11.2 (L) 11/21/2017   HCT 37.2 (L) 11/21/2017   MCV 81.6 11/21/2017   PLT 262 11/21/2017      Chemistry      Component Value Date/Time   NA 136 11/21/2017 0919   NA 139 05-07-2017 1313   K 4.3 11/21/2017 0919   K 3.8 05-07-2017 1313   CL 98 11/21/2017 0919   CO2 30 (H) 11/21/2017 0919   CO2 27 05-07-2017 1313   BUN 12 11/21/2017 0919   BUN 9.8 05-07-2017 1313   CREATININE 1.09 11/21/2017 0919   CREATININE 0.9 05-07-2017 1313      Component Value Date/Time   CALCIUM 9.7 11/21/2017 0919   CALCIUM 8.6 05-07-2017 1313   ALKPHOS 70 11/21/2017 0919   ALKPHOS 57 05-07-2017 1313   AST 10 11/21/2017 0919   AST 9 05-07-2017 1313   ALT 8 11/21/2017 0919   ALT 6 05-07-2017 1313   BILITOT 0.3 11/21/2017 0919   BILITOT 0.23 05-07-2017 1313       RADIOGRAPHIC STUDIES: Ct Chest W Contrast  Result Date: 11/06/2017 CLINICAL DATA:  Non-small cell lung cancer restaging and assessment of response to therapy. Fullness of the pancreas but  without lesion seen at endoscopic ultrasound. EXAM: CT CHEST, ABDOMEN, AND PELVIS WITH CONTRAST TECHNIQUE: Multidetector CT imaging of the chest, abdomen and pelvis was performed following the standard protocol during bolus administration of intravenous contrast. CONTRAST:  141mL ISOVUE-300 IOPAMIDOL (ISOVUE-300) INJECTION 61% COMPARISON:  Multiple exams, including 03/09/2017 and 07/30/2017 FINDINGS: CT CHEST FINDINGS Cardiovascular: Right Port-A-Cath tip: Cavoatrial junction. Atherosclerotic calcification of the aortic arch. Mediastinum/Nodes: Stable 9 mm left thyroid nodule. Lateral AP window lymph node 1.0 cm in short axis on image 21/2, formerly 0.7 cm. The previously hypermetabolic AP window lymph node measures 1.3 cm in short axis on image 22/2, and previously measured 1.1 cm by my measurements on 07/30/2017. Other small scattered mediastinal lymph nodes are present along with small bilateral axillary lymph nodes. No obvious residual large pulmonary embolus although today's exam was not performed as CT angiogram. Lungs/Pleura: New small to moderate left pleural effusion without obvious soft tissue mass along the pleural margin. The previous 3.5 cm left lower lobe mass is felt to be smaller from a subjective standpoint, but is surrounded by  atelectatic lung and possible radiation pneumonitis and accordingly is difficult to measure directly. My best estimate would be about 2.5 by 2.1 cm on image 76/4. Mild bilateral airway thickening. Emphysema noted. Accentuated density in portions of the right upper lobe likely primarily related to radiation port. Musculoskeletal: Thoracic spondylosis. Chronically stable lucency along the sternal manubrium at the right manubriosternal articulation, no change from 2011 and felt to be benign. CT ABDOMEN PELVIS FINDINGS Hepatobiliary: Cholecystectomy.  Otherwise unremarkable. Pancreas: The subtle right-sided prominence of the pancreatic head seems to be chronic and likely  incidental. No current findings of pancreatitis. Spleen: 4 mm nonspecific hypodense lesion in the upper spleen, image 49/2, not appreciably changed from prior MRI and technically too small to characterize. Adrenals/Urinary Tract: Chronically lobular appearance of the left adrenal gland, not appreciably changed from 2,011 and considered benign. No significant renal abnormality observed. Stomach/Bowel: Mild prominence of stool in the rectal vault, otherwise unremarkable. Vascular/Lymphatic: Aortoiliac atherosclerotic vascular disease. Right external iliac node 1.0 cm in short axis and left external iliac lymph node 0.9 cm in short axis, also there is a separate left external iliac lymph node measuring 1.2 cm in short axis. These nodes have been chronically prominent, and or significantly more prominent back on 12/31/2009. There are also some mildly notable inguinal lymph nodes including a 1.4 cm left inguinal lymph node on image 112/2. These pelvic lymph nodes have not been hypermetabolic on prior PET-CT. Reproductive: Unremarkable Other: Subtle presacral stranding. Musculoskeletal: Grade 1 degenerative anterolisthesis at L5-S1 with associated disc bulge and disc uncovering contributing to bilateral foraminal impingement at this level. IMPRESSION: 1. Reduced size of the left lower lobe mass, currently estimated at about 2.5 by 2.1 cm, with surrounding radiation pneumonitis and volume loss. There is also a new left pleural effusion without findings of tumor along the pleural effusion margins. There has been some mild interval enlargement of the AP window lymph nodes. Some of this could be reactive given the inflammatory findings in the left lung, but in particular the more medial node was previously hypermetabolic on the PET-CT of 03/09/2017 and accordingly this enlargement could be related to malignancy. 2. Emphysema with mild bilateral airway thickening. 3. Chronic subtle right-sided prominence of the pancreatic  head, with focal hypermetabolic activity on prior PET-CT but otherwise negative workups since that time including abdominal MRI and endoscopic ultrasound. The significance is uncertain but there is no specific worrisome finding on today's exam, surveillance of this region is likely prudent. 4. Other imaging findings of potential clinical significance: Aortic Atherosclerosis (ICD10-I70.0) and Emphysema (ICD10-J43.9). Mild prominence of stool in the rectal vault. Chronic mild pelvic adenopathy has not previously been hypermetabolic. Impingement at L5-S1. Airway thickening is present, suggesting bronchitis or reactive airways disease. Electronically Signed   By: Van Clines M.D.   On: 11/06/2017 17:47   Ct Abdomen Pelvis W Contrast  Result Date: 11/06/2017 CLINICAL DATA:  Non-small cell lung cancer restaging and assessment of response to therapy. Fullness of the pancreas but without lesion seen at endoscopic ultrasound. EXAM: CT CHEST, ABDOMEN, AND PELVIS WITH CONTRAST TECHNIQUE: Multidetector CT imaging of the chest, abdomen and pelvis was performed following the standard protocol during bolus administration of intravenous contrast. CONTRAST:  149mL ISOVUE-300 IOPAMIDOL (ISOVUE-300) INJECTION 61% COMPARISON:  Multiple exams, including 03/09/2017 and 07/30/2017 FINDINGS: CT CHEST FINDINGS Cardiovascular: Right Port-A-Cath tip: Cavoatrial junction. Atherosclerotic calcification of the aortic arch. Mediastinum/Nodes: Stable 9 mm left thyroid nodule. Lateral AP window lymph node 1.0 cm in short axis on image  21/2, formerly 0.7 cm. The previously hypermetabolic AP window lymph node measures 1.3 cm in short axis on image 22/2, and previously measured 1.1 cm by my measurements on 07/30/2017. Other small scattered mediastinal lymph nodes are present along with small bilateral axillary lymph nodes. No obvious residual large pulmonary embolus although today's exam was not performed as CT angiogram. Lungs/Pleura: New  small to moderate left pleural effusion without obvious soft tissue mass along the pleural margin. The previous 3.5 cm left lower lobe mass is felt to be smaller from a subjective standpoint, but is surrounded by atelectatic lung and possible radiation pneumonitis and accordingly is difficult to measure directly. My best estimate would be about 2.5 by 2.1 cm on image 76/4. Mild bilateral airway thickening. Emphysema noted. Accentuated density in portions of the right upper lobe likely primarily related to radiation port. Musculoskeletal: Thoracic spondylosis. Chronically stable lucency along the sternal manubrium at the right manubriosternal articulation, no change from 2011 and felt to be benign. CT ABDOMEN PELVIS FINDINGS Hepatobiliary: Cholecystectomy.  Otherwise unremarkable. Pancreas: The subtle right-sided prominence of the pancreatic head seems to be chronic and likely incidental. No current findings of pancreatitis. Spleen: 4 mm nonspecific hypodense lesion in the upper spleen, image 49/2, not appreciably changed from prior MRI and technically too small to characterize. Adrenals/Urinary Tract: Chronically lobular appearance of the left adrenal gland, not appreciably changed from 2,011 and considered benign. No significant renal abnormality observed. Stomach/Bowel: Mild prominence of stool in the rectal vault, otherwise unremarkable. Vascular/Lymphatic: Aortoiliac atherosclerotic vascular disease. Right external iliac node 1.0 cm in short axis and left external iliac lymph node 0.9 cm in short axis, also there is a separate left external iliac lymph node measuring 1.2 cm in short axis. These nodes have been chronically prominent, and or significantly more prominent back on 12/31/2009. There are also some mildly notable inguinal lymph nodes including a 1.4 cm left inguinal lymph node on image 112/2. These pelvic lymph nodes have not been hypermetabolic on prior PET-CT. Reproductive: Unremarkable Other: Subtle  presacral stranding. Musculoskeletal: Grade 1 degenerative anterolisthesis at L5-S1 with associated disc bulge and disc uncovering contributing to bilateral foraminal impingement at this level. IMPRESSION: 1. Reduced size of the left lower lobe mass, currently estimated at about 2.5 by 2.1 cm, with surrounding radiation pneumonitis and volume loss. There is also a new left pleural effusion without findings of tumor along the pleural effusion margins. There has been some mild interval enlargement of the AP window lymph nodes. Some of this could be reactive given the inflammatory findings in the left lung, but in particular the more medial node was previously hypermetabolic on the PET-CT of 03/09/2017 and accordingly this enlargement could be related to malignancy. 2. Emphysema with mild bilateral airway thickening. 3. Chronic subtle right-sided prominence of the pancreatic head, with focal hypermetabolic activity on prior PET-CT but otherwise negative workups since that time including abdominal MRI and endoscopic ultrasound. The significance is uncertain but there is no specific worrisome finding on today's exam, surveillance of this region is likely prudent. 4. Other imaging findings of potential clinical significance: Aortic Atherosclerosis (ICD10-I70.0) and Emphysema (ICD10-J43.9). Mild prominence of stool in the rectal vault. Chronic mild pelvic adenopathy has not previously been hypermetabolic. Impingement at L5-S1. Airway thickening is present, suggesting bronchitis or reactive airways disease. Electronically Signed   By: Van Clines M.D.   On: 11/06/2017 17:47    ASSESSMENT AND PLAN:  This is a very pleasant 62 years old African-American male recently diagnosed  with a stage IIIa non-small cell lung cancer, adenocarcinoma presented with left lower lobe lung mass in addition to mediastinal lymphadenopathy.  The patient underwent a course of concurrent chemoradiation with weekly carboplatin and  paclitaxel status post 7 cycles and tolerated his treatment fairly well. He is currently undergoing treatment with consolidation immunotherapy with Imfinzi (Durvalumab) status post 7 cycles.   The patient tolerated the last cycle of his treatment well with no concerning complaints. I recommended for him to proceed with cycle #8 today as a scheduled. For the left hand tremor, I would refer the patient to Dr. Mickeal Skinner for evaluation. For the history of pulmonary embolism, he will continue his current treatment with Lovenox. I will see him back for follow-up visit in 2 weeks for evaluation before starting cycle #9. The patient was advised to call immediately if he has any concerning symptoms in the interval. The patient voices understanding of current disease status and treatment options and is in agreement with the current care plan. All questions were answered. The patient knows to call the clinic with any problems, questions or concerns. We can certainly see the patient much sooner if necessary.  Disclaimer: This note was dictated with voice recognition software. Similar sounding words can inadvertently be transcribed and may not be corrected upon review.

## 2017-11-21 NOTE — Telephone Encounter (Signed)
Scheduled appt per 1/23 los - Pts sister is aware of appt date and time.

## 2017-11-28 ENCOUNTER — Inpatient Hospital Stay (HOSPITAL_BASED_OUTPATIENT_CLINIC_OR_DEPARTMENT_OTHER): Payer: Medicare Other | Admitting: Internal Medicine

## 2017-11-28 ENCOUNTER — Encounter: Payer: Self-pay | Admitting: Internal Medicine

## 2017-11-28 ENCOUNTER — Telehealth: Payer: Self-pay | Admitting: Internal Medicine

## 2017-11-28 DIAGNOSIS — R251 Tremor, unspecified: Secondary | ICD-10-CM | POA: Diagnosis not present

## 2017-11-28 DIAGNOSIS — J90 Pleural effusion, not elsewhere classified: Secondary | ICD-10-CM | POA: Diagnosis not present

## 2017-11-28 DIAGNOSIS — C3432 Malignant neoplasm of lower lobe, left bronchus or lung: Secondary | ICD-10-CM | POA: Diagnosis not present

## 2017-11-28 DIAGNOSIS — Z7901 Long term (current) use of anticoagulants: Secondary | ICD-10-CM | POA: Diagnosis not present

## 2017-11-28 DIAGNOSIS — Z5112 Encounter for antineoplastic immunotherapy: Secondary | ICD-10-CM | POA: Diagnosis not present

## 2017-11-28 DIAGNOSIS — Z86711 Personal history of pulmonary embolism: Secondary | ICD-10-CM | POA: Diagnosis not present

## 2017-11-28 DIAGNOSIS — G252 Other specified forms of tremor: Secondary | ICD-10-CM | POA: Diagnosis not present

## 2017-11-28 NOTE — Telephone Encounter (Signed)
Scheduled appt per 1/30 los - Gave patient AVS and calender per los.

## 2017-11-29 DIAGNOSIS — R251 Tremor, unspecified: Secondary | ICD-10-CM | POA: Insufficient documentation

## 2017-11-29 NOTE — Progress Notes (Signed)
[facility] [place], [place] [address] [phone]   New Patient Evaluation  Date of Service: 11/29/17 Patient Name: James Zamora Patient MRN: [id number] Patient DOB: 02-29-1956 Provider: Ventura Sellers, MD  Identifying Statement:  James Zamora is a 62 y.o. male with tremor who presents for initial consultation and evaluation regarding cancer associated neurologic deficits.    Referring Provider: [name], MD [address] [place], [place] [address]  Primary Cancer: NSCLC stage III  History of Present Illness: The patient's records from the referring physician were obtained and reviewed and the patient interviewed to confirm this HPI.  [name] presents today to discuss involuntary movements primarily affecting his left hand.  He describes "shaking" in a rhythmic pattern, occasionally at rest but always amplified by activity or movement, such as manipulating a phone.  He thinks this started several months ago.  Although he doesn't feel it interferes in any way with his daily function, his family says he is "close to dropping things".  He does acknowledge a "slowing" of movements and thinking in general recently.  He denies stiffness in his arms, recent sleep disturbances.  He is currently receiving immunotherapy consolidation for his lung cancer, for which he has been oxygen dependent.    Medications: Current Outpatient Medications on File Prior to Visit  Medication Sig Dispense Refill  . aspirin EC 81 MG EC tablet Take 1 tablet (81 mg total) by mouth daily. 30 tablet 1  . enoxaparin (LOVENOX) 150 MG/ML injection Inject 0.9 mLs (135 mg total) into the skin daily. 30 Syringe 1  . furosemide (LASIX) 40 MG tablet Take 1 tablet (40 mg total) by mouth daily. 90 tablet 3  . lidocaine-prilocaine (EMLA) cream Apply 1 application topically as needed. Apply small amount to port site 1-1.5 hours before treatment.  Cover with plastic wrap. 30 g 0  . OXYGEN Inhale 3 L into the lungs continuous.    . fluticasone furoate-vilanterol (BREO ELLIPTA) 200-25 MCG/INH AEPB Inhale 1 puff into the lungs daily. (Patient not taking: Reported on 11/07/2017) 1 each 5  . potassium chloride SA (KLOR-CON M20) 20 MEQ tablet Take 1 tablet (20 mEq total) by mouth daily. 30 tablet 6   No current facility-administered medications on file prior to visit.     Allergies:  Allergies  Allergen Reactions  . Bee Venom Anaphylaxis  . Penicillins Itching and Swelling    Pt has tolerated cephalosporins in the past Has patient had a PCN reaction causing immediate rash, facial/tongue/throat swelling, SOB or lightheadedness with hypotension: unknown Has patient had a PCN reaction causing severe rash involving mucus membranes or skin necrosis: unknown Has patient had a PCN reaction that required hospitalization: unknown Has patient had a PCN reaction occurring within the last 10 years: unknown If all of the above answers are "NO", then may proceed with Cephalosporin [place]   Past Medical History:  Past Medical History:  Diagnosis Date  . Adenocarcinoma of left lung, stage 3 ([place]) 05/10/2017  . Aortic atherosclerosis ([place]) 07/31/2017  . Asthma   . Chronic diastolic CHF (congestive heart failure) ([place])   . Chronic fatigue 08/02/2017  . Lung mass   . Non-small cell lung cancer ([place]) 11/13/2015  . Obesity   . Perforated bowel [facility])    Past Surgical History:  Past Surgical History:  Procedure Laterality Date  . CHOLECYSTECTOMY    . COLONOSCOPY N/A 07/31/2016   Procedure: COLONOSCOPY;  Surgeon: Danie Binder, MD;  Location: [facility];  Service: Endoscopy;  Laterality: N/A;  1:45 pm  . ESOPHAGOGASTRODUODENOSCOPY N/A 07/31/2016   Procedure: ESOPHAGOGASTRODUODENOSCOPY (EGD);  Surgeon: Danie Binder, MD;  Location: [facility];  Service: Endoscopy;  Laterality: N/A;  . EUS N/A 06/07/2017   Procedure: UPPER ENDOSCOPIC ULTRASOUND (EUS)  LINEAR;  Surgeon: Milus Banister, MD;  Location: [facility];  Service: Endoscopy;  Laterality: N/A;  . HERNIA REPAIR    . IR FLUORO GUIDE PORT INSERTION RIGHT  07/11/2017  . IR US GUIDE VASC ACCESS RIGHT  07/11/2017   Social History:  Social History   Socioeconomic History  . Marital status: Single    Spouse name: Not on file  . Number of children: Not on file  . Years of education: Not on file  . Highest education level: Not on file  Social Needs  . Financial resource strain: Not on file  . Food insecurity - worry: Not on file  . Food insecurity - inability: Not on file  . Transportation needs - medical: Not on file  . Transportation needs - non-medical: Not on file  Occupational History  . Not on file  Tobacco Use  . Smoking status: Former Smoker    Packs/day: 2.00    Years: 10.00    Pack years: 20.00    Types: Cigarettes    Last attempt to quit: 07/06/2012    Years since quitting: 5.4  . Smokeless tobacco: Former Systems developer    Types: [place] date: 07/06/2014  Substance and Sexual Activity  . Alcohol use: No  . Drug use: No  . Sexual activity: No  Other Topics Concern  . Not on file  Social History Narrative  . Not on file   Family History:  Family History  Problem Relation Age of Onset  . Diabetes Mother   . Hypertension Mother   . Asthma Father   . Prostate cancer Father   . Hypertension Father   . Colon cancer Neg Hx     Review of Systems: Constitutional: Denies fevers, chills or abnormal weight loss Eyes: Denies blurriness of vision Ears, nose, mouth, throat, and face: Denies mucositis or sore throat Respiratory: Denies cough, dyspnea or wheezes Cardiovascular: Denies palpitation, chest discomfort or lower extremity swelling Gastrointestinal:  Denies nausea, constipation, diarrhea GU: Denies dysuria or incontinence Skin: Denies abnormal skin rashes Neurological: Per HPI Musculoskeletal: +joint pain Behavioral/Psych: Denies anxiety, disturbance in  thought content, and mood instability   Physical Exam: Vitals:   11/28/17 1137  BP: (!) 109/53  Pulse: (!) 117  Resp: (!) 24  Temp: 97.6 F (36.4 C)  SpO2: 93%   KPS: 80. General: Alert, cooperative, pleasant, in no acute distress.  On supplemental O2. Head: Normal EENT: No conjunctival injection or scleral icterus. Oral mucosa moist Lungs: Resp effort normal Cardiac: Regular rate and rhythm Abdomen: Soft, non-distended abdomen Skin: No rashes cyanosis or petechiae. Extremities: No clubbing or edema  Neurologic Exam: Mental Status: Awake, alert, attentive to examiner. Oriented to self and environment. Language is fluent with intact comprehension.  Affect somewhat blunted, masked Cranial Nerves: Visual acuity is grossly normal. Visual fields are full. Extra-ocular movements intact. No ptosis. Face is symmetric, tongue midline. Motor: Tone and bulk are normal. Power is full in both arms and legs, mild cogwheeling noted in right arm on passive flexion. Reflexes are symmetric, no pathologic reflexes present. Intact finger to nose bilaterally.  Poly-mini-myoclonus appreciated on fixed posturing, but  no tremor  Sensory: Intact to light touch and temperature Gait: Decreased right arm swing.  Normal speed   Labs: I have reviewed the data as listed    Component Value Date/Time   NA 136 11/21/2017 0919   NA 139 Feb 24, 202018 1313   K 4.3 11/21/2017 0919   K 3.8 Feb 24, 202018 1313   CL 98 11/21/2017 0919   CO2 30 (H) 11/21/2017 0919   CO2 27 Feb 24, 202018 1313   GLUCOSE 97 11/21/2017 0919   GLUCOSE 89 Feb 24, 202018 1313   BUN 12 11/21/2017 0919   BUN 9.8 Feb 24, 202018 1313   CREATININE 1.09 11/21/2017 0919   CREATININE 0.9 Feb 24, 202018 1313   CALCIUM 9.7 11/21/2017 0919   CALCIUM 8.6 Feb 24, 202018 1313   PROT 8.7 (H) 11/21/2017 0919   PROT 6.7 Feb 24, 202018 1313   ALBUMIN 3.1 (L) 11/21/2017 0919   ALBUMIN 2.7 (L) Feb 24, 202018 1313   AST 10 11/21/2017 0919   AST 9 Feb 24, 202018 1313   ALT 8  11/21/2017 0919   ALT 6 Feb 24, 202018 1313   ALKPHOS 70 11/21/2017 0919   ALKPHOS 57 Feb 24, 202018 1313   BILITOT 0.3 11/21/2017 0919   BILITOT 0.23 Feb 24, 202018 1313   GFRNONAA >60 11/21/2017 0919   GFRAA >60 11/21/2017 0919   Lab Results  Component Value Date   WBC 6.4 11/21/2017   NEUTROABS 4.5 11/21/2017   HGB 11.2 (L) 11/21/2017   HCT 37.2 (L) 11/21/2017   MCV 81.6 11/21/2017   PLT 262 11/21/2017    Imaging:  CLINICAL DATA:  Left lung adenocarcinoma.  Initial staging.  EXAM: MRI HEAD WITHOUT AND WITH CONTRAST  TECHNIQUE: Multiplanar, multiecho pulse sequences of the brain and surrounding structures were obtained without and with intravenous contrast.  CONTRAST:  66mL MULTIHANCE GADOBENATE DIMEGLUMINE 529 MG/ML IV SOLN  COMPARISON:  None.  FINDINGS: Brain: There is no evidence of acute infarct, intracranial hemorrhage, mass, midline shift, or extra-axial fluid collection. The ventricles are normal in size. A tiny incidental cavum septum pellucidum is noted. Patchy T2 hyperintensities in the cerebral white matter and pons are nonspecific but compatible with mild chronic small vessel ischemic disease. No abnormal enhancement is identified.  Vascular: A persistent left trigeminal artery is noted, a normal variant. The left vertebral artery is not well visualized, likely hypoplastic.  Skull and upper cervical spine: Unremarkable bone marrow signal.  Sinuses/Orbits: Unremarkable orbits. Paranasal sinuses and mastoid air cells are clear.  Other: None.  IMPRESSION: 1. No evidence of intracranial metastases. 2. Mild chronic small vessel ischemic disease.   Electronically Signed   By: Logan Bores M.D.   On: 05/17/2017 08:30   Assessment/Plan 1. Tremor  Mr. Consalvo has some mild parkinsonian features on exam today.  I was not able to elicit his tremor.  It is likely that his chronic respiratory failure is an additional exacerbating factor.   Fortunately, at this point there is little to no interference in his day to day function.    After extensive discussion on treatment options with the patient and his family, specifically dopamine agonist therapy, he will stay off of medication for now.    There is no need for a repeat MRI at this time, his previous study was reviewed.  If symptoms worsen or evolve, he will contact us for further evaluation.  Otherwise, we will re-examine him in 3 months, now that baseline is established.  We spent twenty additional minutes teaching regarding the natural history, biology, and historical experience in the treatment of neurologic complications of cancer. We  also provided teaching sheets for the patient to take home as an additional resource.  We appreciate the opportunity to participate in the care of AUSTINE WIEDEMAN.   All questions were answered. The patient knows to call the clinic with any problems, questions or concerns. No barriers to learning were detected.  The total time spent in the encounter was 60 minutes and more than 50% was on counseling and review of test results   Ventura Sellers, MD Medical Director of Neuro-Oncology [facility] at [place] 11/29/17 9:31 AM

## 2017-12-05 ENCOUNTER — Inpatient Hospital Stay: Payer: Medicare Other | Attending: Internal Medicine

## 2017-12-05 ENCOUNTER — Inpatient Hospital Stay: Payer: Medicare Other

## 2017-12-05 ENCOUNTER — Encounter: Payer: Self-pay | Admitting: Internal Medicine

## 2017-12-05 ENCOUNTER — Telehealth: Payer: Self-pay | Admitting: Internal Medicine

## 2017-12-05 ENCOUNTER — Inpatient Hospital Stay (HOSPITAL_BASED_OUTPATIENT_CLINIC_OR_DEPARTMENT_OTHER): Payer: Medicare Other | Admitting: Internal Medicine

## 2017-12-05 ENCOUNTER — Other Ambulatory Visit: Payer: Self-pay | Admitting: Medical Oncology

## 2017-12-05 VITALS — BP 121/63 | HR 119 | Temp 97.6°F | Resp 16 | Ht 66.0 in | Wt 199.3 lb

## 2017-12-05 DIAGNOSIS — E041 Nontoxic single thyroid nodule: Secondary | ICD-10-CM | POA: Diagnosis not present

## 2017-12-05 DIAGNOSIS — C3432 Malignant neoplasm of lower lobe, left bronchus or lung: Secondary | ICD-10-CM

## 2017-12-05 DIAGNOSIS — Z5112 Encounter for antineoplastic immunotherapy: Secondary | ICD-10-CM | POA: Diagnosis not present

## 2017-12-05 DIAGNOSIS — R000 Tachycardia, unspecified: Secondary | ICD-10-CM

## 2017-12-05 DIAGNOSIS — C3492 Malignant neoplasm of unspecified part of left bronchus or lung: Secondary | ICD-10-CM

## 2017-12-05 DIAGNOSIS — E860 Dehydration: Secondary | ICD-10-CM | POA: Insufficient documentation

## 2017-12-05 DIAGNOSIS — R5382 Chronic fatigue, unspecified: Secondary | ICD-10-CM

## 2017-12-05 LAB — COMPREHENSIVE METABOLIC PANEL
ALBUMIN: 2.9 g/dL — AB (ref 3.5–5.0)
ALK PHOS: 74 U/L (ref 40–150)
AST: 9 U/L (ref 5–34)
Anion gap: 7 (ref 3–11)
BUN: 9 mg/dL (ref 7–26)
CO2: 29 mmol/L (ref 22–29)
CREATININE: 0.88 mg/dL (ref 0.70–1.30)
Calcium: 9 mg/dL (ref 8.4–10.4)
Chloride: 103 mmol/L (ref 98–109)
GFR calc Af Amer: >60 mL/min (ref >60)
GFR calc non Af Amer: >60 mL/min (ref >60)
GLUCOSE: 96 mg/dL (ref 70–140)
POTASSIUM: 4.1 mmol/L (ref 3.5–5.1)
Sodium: 139 mmol/L (ref 136–145)
TOTAL PROTEIN: 7.9 g/dL (ref 6.4–8.3)

## 2017-12-05 LAB — CBC WITH DIFFERENTIAL/PLATELET
BASOS ABS: 0 10*3/uL (ref 0.0–0.1)
Basophils Relative: 1 %
EOS ABS: 0.2 10*3/uL (ref 0.0–0.5)
EOS PCT: 4 %
HCT: 34 % — ABNORMAL LOW (ref 38.4–49.9)
Hemoglobin: 10.6 g/dL — ABNORMAL LOW (ref 13.0–17.1)
LYMPHS ABS: 0.8 10*3/uL — AB (ref 0.9–3.3)
LYMPHS PCT: 15 %
MCH: 24.4 pg — AB (ref 27.2–33.4)
MCHC: 31.2 g/dL — ABNORMAL LOW (ref 32.0–36.0)
MCV: 78.2 fL — AB (ref 79.3–98.0)
MONO ABS: 0.5 10*3/uL (ref 0.1–0.9)
Monocytes Relative: 9 %
Neutro Abs: 3.9 10*3/uL (ref 1.5–6.5)
Neutrophils Relative %: 71 %
PLATELETS: 252 10*3/uL (ref 140–400)
RBC: 4.35 MIL/uL (ref 4.20–5.82)
RDW: 16.8 % — AB (ref 11.0–14.6)
WBC: 5.5 10*3/uL (ref 4.0–10.3)

## 2017-12-05 LAB — TSH: TSH: 0.355 u[IU]/mL (ref 0.320–4.118)

## 2017-12-05 MED ORDER — SODIUM CHLORIDE 0.9 % IV SOLN
Freq: Once | INTRAVENOUS | Status: AC
  Administered 2017-12-05: 10:00:00 via INTRAVENOUS

## 2017-12-05 MED ORDER — HEPARIN SOD (PORK) LOCK FLUSH 100 UNIT/ML IV SOLN
500.0000 [IU] | Freq: Once | INTRAVENOUS | Status: AC | PRN
  Administered 2017-12-05: 500 [IU]
  Filled 2017-12-05: qty 5

## 2017-12-05 MED ORDER — SODIUM CHLORIDE 0.9 % IV SOLN
500.0000 mL | Freq: Once | INTRAVENOUS | Status: AC
  Administered 2017-12-05: 500 mL via INTRAVENOUS

## 2017-12-05 MED ORDER — SODIUM CHLORIDE 0.9% FLUSH
10.0000 mL | Freq: Once | INTRAVENOUS | Status: AC
  Administered 2017-12-05: 10 mL
  Filled 2017-12-05: qty 10

## 2017-12-05 MED ORDER — SODIUM CHLORIDE 0.9 % IV SOLN
9.0000 mg/kg | Freq: Once | INTRAVENOUS | Status: AC
  Administered 2017-12-05: 860 mg via INTRAVENOUS
  Filled 2017-12-05: qty 10

## 2017-12-05 MED ORDER — SODIUM CHLORIDE 0.9% FLUSH
10.0000 mL | INTRAVENOUS | Status: DC | PRN
  Administered 2017-12-05: 10 mL
  Filled 2017-12-05: qty 10

## 2017-12-05 NOTE — Patient Instructions (Signed)
[facility] Discharge Instructions for Patients Receiving Chemotherapy  Today you received the following chemotherapy agents: Imfinzi.  To help prevent nausea and vomiting after your treatment, we encourage you to take your nausea medication as directed.   If you develop nausea and vomiting that is not controlled by your nausea medication, call the clinic.   BELOW ARE SYMPTOMS THAT SHOULD BE REPORTED IMMEDIATELY:  *FEVER GREATER THAN 100.5 F  *CHILLS WITH OR WITHOUT FEVER  NAUSEA AND VOMITING THAT IS NOT CONTROLLED WITH YOUR NAUSEA MEDICATION  *UNUSUAL SHORTNESS OF BREATH  *UNUSUAL BRUISING OR BLEEDING  TENDERNESS IN MOUTH AND THROAT WITH OR WITHOUT PRESENCE OF ULCERS  *URINARY PROBLEMS  *BOWEL PROBLEMS  UNUSUAL RASH Items with * indicate a potential emergency and should be followed up as soon as possible.  Feel free to call the clinic should you have any questions or concerns. The clinic phone number is [phone].  Please show the CHEMO ALERT CARD at check-in to the Emergency Department and triage nurse.   

## 2017-12-05 NOTE — Progress Notes (Signed)
[place] [phone]   [phone]  OFFICE PROGRESS NOTE  [name], MD [address] [place] [address]  DIAGNOSIS: Stage IIIA (T2a, N2, M0) non-small cell lung cancer, adenocarcinoma presented with left lower lobe lung mass in addition to mediastinal lymphadenopathy diagnosed in June 2018. The patient also has suspicious soft tissue density in the pancreatic head but this has been present for 3 years on previous CT scan and unlikely to be related to [name], last dose was given 07/03/2017.  CURRENT THERAPY: Consolidation treatment with immunotherapy with Imfinzi (Durvalumab) 10 MG/KG every 2 weeks, first dose 08/15/2017.  Status post 8 cycles.  INTERVAL HISTORY: James Zamora 62 y.o. male returns to the clinic today for follow-up visit accompanied by his sister.  The patient is feeling fine today with no specific complaints.  He continues to tolerate his current treatment with immunotherapy with Imfinzi (Durvalumab) fairly well.  He denied having any chest pain, shortness of breath except with exertion, cough or hemoptysis.  He denied having any fever or chills.  He has no nausea, vomiting, diarrhea or constipation.  He is here today for evaluation before starting cycle #9 of his treatment.   MEDICAL HISTORY: Past Medical History:  Diagnosis Date  . Adenocarcinoma of left lung, stage 3 ([place]) 05/10/2017  . Aortic atherosclerosis ([place]) 07/31/2017  . Asthma   . Chronic diastolic CHF (congestive heart failure) ([place])   . Chronic fatigue 08/02/2017  . Lung mass   . Non-small cell lung cancer ([place]) 11/13/2015  . Obesity   . Perforated bowel ([place])     ALLERGIES:  is allergic to bee venom and penicillins.  MEDICATIONS:  Current Outpatient Medications  Medication Sig Dispense  Refill  . aspirin EC 81 MG EC tablet Take 1 tablet (81 mg total) by mouth daily. 30 tablet 1  . enoxaparin (LOVENOX) 150 MG/ML injection Inject 0.9 mLs (135 mg total) into the skin daily. 30 Syringe 1  . fluticasone furoate-vilanterol (BREO ELLIPTA) 200-25 MCG/INH AEPB Inhale 1 puff into the lungs daily. (Patient not taking: Reported on 11/07/2017) 1 each 5  . furosemide (LASIX) 40 MG tablet Take 1 tablet (40 mg total) by mouth daily. 90 tablet 3  . lidocaine-prilocaine (EMLA) cream Apply 1 application topically as needed. Apply small amount to port site 1-1.5 hours before treatment. Cover with plastic wrap. 30 g 0  . OXYGEN Inhale 3 L into the lungs continuous.    . potassium chloride SA (KLOR-CON M20) 20 MEQ tablet Take 1 tablet (20 mEq total) by mouth daily. 30 tablet 6   No current facility-administered medications for this visit.     SURGICAL HISTORY:  Past Surgical History:  Procedure Laterality Date  . CHOLECYSTECTOMY    . COLONOSCOPY N/A 07/31/2016   Procedure: COLONOSCOPY;  Surgeon: Danie Binder, MD;  Location: [facility];  Service: Endoscopy;  Laterality: N/A;  1:45 pm  . ESOPHAGOGASTRODUODENOSCOPY N/A 07/31/2016   Procedure: ESOPHAGOGASTRODUODENOSCOPY (EGD);  Surgeon: Danie Binder, MD;  Location: [facility];  Service: Endoscopy;  Laterality: N/A;  . EUS N/A 06/07/2017   Procedure: UPPER ENDOSCOPIC ULTRASOUND (EUS) LINEAR;  Surgeon: Milus Banister, MD;  Location: [facility];  Service: Endoscopy;  Laterality: N/A;  . HERNIA REPAIR    . IR FLUORO GUIDE  PORT INSERTION RIGHT  07/11/2017  . IR US GUIDE VASC ACCESS RIGHT  07/11/2017    REVIEW OF SYSTEMS:  A comprehensive review of systems was negative except for: Respiratory: positive for dyspnea on exertion   PHYSICAL EXAMINATION: General appearance: alert, cooperative and no distress Head: Normocephalic, without obvious abnormality, atraumatic Neck: no adenopathy, no JVD, supple, symmetrical, trachea midline and thyroid not  enlarged, symmetric, no tenderness/mass/nodules Lymph nodes: Cervical, supraclavicular, and axillary nodes normal. Resp: clear to auscultation bilaterally Back: symmetric, no curvature. ROM normal. No CVA tenderness. Cardio: regular rate and rhythm, S1, S2 normal, no murmur, click, rub or gallop GI: soft, non-tender; bowel sounds normal; no masses,  no organomegaly Extremities: edema 1+  ECOG PERFORMANCE STATUS: 1 - Symptomatic but completely ambulatory  Blood pressure 121/63, pulse (!) 119, temperature 97.6 F (36.4 C), temperature source Oral, resp. rate 16, height 5\' 6"  (1.676 m), weight 199 lb 4.8 oz (90.4 kg), SpO2 100 %.  LABORATORY DATA: Lab Results  Component Value Date   WBC 6.4 11/21/2017   HGB 11.2 (L) 11/21/2017   HCT 37.2 (L) 11/21/2017   MCV 81.6 11/21/2017   PLT 262 11/21/2017      Chemistry      Component Value Date/Time   NA 136 11/21/2017 0919   NA 139 02-Jul-202018 1313   K 4.3 11/21/2017 0919   K 3.8 02-Jul-202018 1313   CL 98 11/21/2017 0919   CO2 30 (H) 11/21/2017 0919   CO2 27 02-Jul-202018 1313   BUN 12 11/21/2017 0919   BUN 9.8 02-Jul-202018 1313   CREATININE 1.09 11/21/2017 0919   CREATININE 0.9 02-Jul-202018 1313      Component Value Date/Time   CALCIUM 9.7 11/21/2017 0919   CALCIUM 8.6 02-Jul-202018 1313   ALKPHOS 70 11/21/2017 0919   ALKPHOS 57 02-Jul-202018 1313   AST 10 11/21/2017 0919   AST 9 02-Jul-202018 1313   ALT 8 11/21/2017 0919   ALT 6 02-Jul-202018 1313   BILITOT 0.3 11/21/2017 0919   BILITOT 0.23 02-Jul-202018 1313       RADIOGRAPHIC STUDIES: Ct Chest W Contrast  Result Date: 11/06/2017 CLINICAL DATA:  Non-small cell lung cancer restaging and assessment of response to therapy. Fullness of the pancreas but without lesion seen at endoscopic ultrasound. EXAM: CT CHEST, ABDOMEN, AND PELVIS WITH CONTRAST TECHNIQUE: Multidetector CT imaging of the chest, abdomen and pelvis was performed following the standard protocol during bolus administration of  intravenous contrast. CONTRAST:  155mL ISOVUE-300 IOPAMIDOL (ISOVUE-300) INJECTION 61% COMPARISON:  Multiple exams, including 03/09/2017 and 07/30/2017 FINDINGS: CT CHEST FINDINGS Cardiovascular: Right Port-A-Cath tip: Cavoatrial junction. Atherosclerotic calcification of the aortic arch. Mediastinum/Nodes: Stable 9 mm left thyroid nodule. Lateral AP window lymph node 1.0 cm in short axis on image 21/2, formerly 0.7 cm. The previously hypermetabolic AP window lymph node measures 1.3 cm in short axis on image 22/2, and previously measured 1.1 cm by my measurements on 07/30/2017. Other small scattered mediastinal lymph nodes are present along with small bilateral axillary lymph nodes. No obvious residual large pulmonary embolus although today's exam was not performed as CT angiogram. Lungs/Pleura: New small to moderate left pleural effusion without obvious soft tissue mass along the pleural margin. The previous 3.5 cm left lower lobe mass is felt to be smaller from a subjective standpoint, but is surrounded by atelectatic lung and possible radiation pneumonitis and accordingly is difficult to measure directly. My best estimate would be about 2.5 by 2.1 cm on image 76/4. Mild bilateral airway thickening.  Emphysema noted. Accentuated density in portions of the right upper lobe likely primarily related to radiation port. Musculoskeletal: Thoracic spondylosis. Chronically stable lucency along the sternal manubrium at the right manubriosternal articulation, no change from 2011 and felt to be benign. CT ABDOMEN PELVIS FINDINGS Hepatobiliary: Cholecystectomy.  Otherwise unremarkable. Pancreas: The subtle right-sided prominence of the pancreatic head seems to be chronic and likely incidental. No current findings of pancreatitis. Spleen: 4 mm nonspecific hypodense lesion in the upper spleen, image 49/2, not appreciably changed from prior MRI and technically too small to characterize. Adrenals/Urinary Tract: Chronically lobular  appearance of the left adrenal gland, not appreciably changed from 2,011 and considered benign. No significant renal abnormality observed. Stomach/Bowel: Mild prominence of stool in the rectal vault, otherwise unremarkable. Vascular/Lymphatic: Aortoiliac atherosclerotic vascular disease. Right external iliac node 1.0 cm in short axis and left external iliac lymph node 0.9 cm in short axis, also there is a separate left external iliac lymph node measuring 1.2 cm in short axis. These nodes have been chronically prominent, and or significantly more prominent back on 12/31/2009. There are also some mildly notable inguinal lymph nodes including a 1.4 cm left inguinal lymph node on image 112/2. These pelvic lymph nodes have not been hypermetabolic on prior PET-CT. Reproductive: Unremarkable Other: Subtle presacral stranding. Musculoskeletal: Grade 1 degenerative anterolisthesis at L5-S1 with associated disc bulge and disc uncovering contributing to bilateral foraminal impingement at this level. IMPRESSION: 1. Reduced size of the left lower lobe mass, currently estimated at about 2.5 by 2.1 cm, with surrounding radiation pneumonitis and volume loss. There is also a new left pleural effusion without findings of tumor along the pleural effusion margins. There has been some mild interval enlargement of the AP window lymph nodes. Some of this could be reactive given the inflammatory findings in the left lung, but in particular the more medial node was previously hypermetabolic on the PET-CT of 03/09/2017 and accordingly this enlargement could be related to malignancy. 2. Emphysema with mild bilateral airway thickening. 3. Chronic subtle right-sided prominence of the pancreatic head, with focal hypermetabolic activity on prior PET-CT but otherwise negative workups since that time including abdominal MRI and endoscopic ultrasound. The significance is uncertain but there is no specific worrisome finding on today's exam,  surveillance of this region is likely prudent. 4. Other imaging findings of potential clinical significance: Aortic Atherosclerosis (ICD10-I70.0) and Emphysema (ICD10-J43.9). Mild prominence of stool in the rectal vault. Chronic mild pelvic adenopathy has not previously been hypermetabolic. Impingement at L5-S1. Airway thickening is present, suggesting bronchitis or reactive airways disease. Electronically Signed   By: Van Clines M.D.   On: 11/06/2017 17:47   Ct Abdomen Pelvis W Contrast  Result Date: 11/06/2017 CLINICAL DATA:  Non-small cell lung cancer restaging and assessment of response to therapy. Fullness of the pancreas but without lesion seen at endoscopic ultrasound. EXAM: CT CHEST, ABDOMEN, AND PELVIS WITH CONTRAST TECHNIQUE: Multidetector CT imaging of the chest, abdomen and pelvis was performed following the standard protocol during bolus administration of intravenous contrast. CONTRAST:  175mL ISOVUE-300 IOPAMIDOL (ISOVUE-300) INJECTION 61% COMPARISON:  Multiple exams, including 03/09/2017 and 07/30/2017 FINDINGS: CT CHEST FINDINGS Cardiovascular: Right Port-A-Cath tip: Cavoatrial junction. Atherosclerotic calcification of the aortic arch. Mediastinum/Nodes: Stable 9 mm left thyroid nodule. Lateral AP window lymph node 1.0 cm in short axis on image 21/2, formerly 0.7 cm. The previously hypermetabolic AP window lymph node measures 1.3 cm in short axis on image 22/2, and previously measured 1.1 cm by my measurements on 07/30/2017.  Other small scattered mediastinal lymph nodes are present along with small bilateral axillary lymph nodes. No obvious residual large pulmonary embolus although today's exam was not performed as CT angiogram. Lungs/Pleura: New small to moderate left pleural effusion without obvious soft tissue mass along the pleural margin. The previous 3.5 cm left lower lobe mass is felt to be smaller from a subjective standpoint, but is surrounded by atelectatic lung and possible  radiation pneumonitis and accordingly is difficult to measure directly. My best estimate would be about 2.5 by 2.1 cm on image 76/4. Mild bilateral airway thickening. Emphysema noted. Accentuated density in portions of the right upper lobe likely primarily related to radiation port. Musculoskeletal: Thoracic spondylosis. Chronically stable lucency along the sternal manubrium at the right manubriosternal articulation, no change from 2011 and felt to be benign. CT ABDOMEN PELVIS FINDINGS Hepatobiliary: Cholecystectomy.  Otherwise unremarkable. Pancreas: The subtle right-sided prominence of the pancreatic head seems to be chronic and likely incidental. No current findings of pancreatitis. Spleen: 4 mm nonspecific hypodense lesion in the upper spleen, image 49/2, not appreciably changed from prior MRI and technically too small to characterize. Adrenals/Urinary Tract: Chronically lobular appearance of the left adrenal gland, not appreciably changed from 2,011 and considered benign. No significant renal abnormality observed. Stomach/Bowel: Mild prominence of stool in the rectal vault, otherwise unremarkable. Vascular/Lymphatic: Aortoiliac atherosclerotic vascular disease. Right external iliac node 1.0 cm in short axis and left external iliac lymph node 0.9 cm in short axis, also there is a separate left external iliac lymph node measuring 1.2 cm in short axis. These nodes have been chronically prominent, and or significantly more prominent back on 12/31/2009. There are also some mildly notable inguinal lymph nodes including a 1.4 cm left inguinal lymph node on image 112/2. These pelvic lymph nodes have not been hypermetabolic on prior PET-CT. Reproductive: Unremarkable Other: Subtle presacral stranding. Musculoskeletal: Grade 1 degenerative anterolisthesis at L5-S1 with associated disc bulge and disc uncovering contributing to bilateral foraminal impingement at this level. IMPRESSION: 1. Reduced size of the left lower  lobe mass, currently estimated at about 2.5 by 2.1 cm, with surrounding radiation pneumonitis and volume loss. There is also a new left pleural effusion without findings of tumor along the pleural effusion margins. There has been some mild interval enlargement of the AP window lymph nodes. Some of this could be reactive given the inflammatory findings in the left lung, but in particular the more medial node was previously hypermetabolic on the PET-CT of 03/09/2017 and accordingly this enlargement could be related to malignancy. 2. Emphysema with mild bilateral airway thickening. 3. Chronic subtle right-sided prominence of the pancreatic head, with focal hypermetabolic activity on prior PET-CT but otherwise negative workups since that time including abdominal MRI and endoscopic ultrasound. The significance is uncertain but there is no specific worrisome finding on today's exam, surveillance of this region is likely prudent. 4. Other imaging findings of potential clinical significance: Aortic Atherosclerosis (ICD10-I70.0) and Emphysema (ICD10-J43.9). Mild prominence of stool in the rectal vault. Chronic mild pelvic adenopathy has not previously been hypermetabolic. Impingement at L5-S1. Airway thickening is present, suggesting bronchitis or reactive airways disease. Electronically Signed   By: Van Clines M.D.   On: 11/06/2017 17:47    ASSESSMENT AND PLAN:  This is a very pleasant 62 years old African-American male recently diagnosed with a stage IIIa non-small cell lung cancer, adenocarcinoma presented with left lower lobe lung mass in addition to mediastinal lymphadenopathy.  The patient underwent a course of concurrent chemoradiation  with weekly carboplatin and paclitaxel status post 7 cycles and tolerated his treatment fairly well. He is currently undergoing treatment with consolidation immunotherapy with Imfinzi (Durvalumab) status post 8 cycles.   The patient continues to tolerate his treatment  fairly well with no concerning complaints. I recommended for him to proceed with cycle #9 today as a schedule. For the tachycardia and dehydration, I will arrange for the patient to receive 500 cc of normal saline in the clinic today. He was advised to call immediately if he has any concerning symptoms in the interval. The patient voices understanding of current disease status and treatment options and is in agreement with the current care plan. All questions were answered. The patient knows to call the clinic with any problems, questions or concerns. We can certainly see the patient much sooner if necessary.  Disclaimer: This note was dictated with voice recognition software. Similar sounding words can inadvertently be transcribed and may not be corrected upon review.

## 2017-12-05 NOTE — Telephone Encounter (Signed)
Gave avs and calendar for april °

## 2017-12-11 DIAGNOSIS — I5032 Chronic diastolic (congestive) heart failure: Secondary | ICD-10-CM | POA: Diagnosis not present

## 2017-12-11 DIAGNOSIS — Z9981 Dependence on supplemental oxygen: Secondary | ICD-10-CM | POA: Diagnosis not present

## 2017-12-11 DIAGNOSIS — R0902 Hypoxemia: Secondary | ICD-10-CM | POA: Diagnosis not present

## 2017-12-11 DIAGNOSIS — J449 Chronic obstructive pulmonary disease, unspecified: Secondary | ICD-10-CM | POA: Diagnosis not present

## 2017-12-19 ENCOUNTER — Encounter: Payer: Self-pay | Admitting: Oncology

## 2017-12-19 ENCOUNTER — Inpatient Hospital Stay: Payer: Medicare Other

## 2017-12-19 ENCOUNTER — Other Ambulatory Visit: Payer: Self-pay

## 2017-12-19 ENCOUNTER — Inpatient Hospital Stay (HOSPITAL_BASED_OUTPATIENT_CLINIC_OR_DEPARTMENT_OTHER): Payer: Medicare Other | Admitting: Oncology

## 2017-12-19 ENCOUNTER — Telehealth: Payer: Self-pay | Admitting: Oncology

## 2017-12-19 ENCOUNTER — Encounter: Payer: Medicare Other | Admitting: Nutrition

## 2017-12-19 ENCOUNTER — Inpatient Hospital Stay: Payer: Medicare Other | Admitting: Nutrition

## 2017-12-19 VITALS — BP 119/57 | HR 118 | Temp 97.1°F | Resp 20 | Wt 198.4 lb

## 2017-12-19 DIAGNOSIS — E860 Dehydration: Secondary | ICD-10-CM | POA: Diagnosis not present

## 2017-12-19 DIAGNOSIS — C3492 Malignant neoplasm of unspecified part of left bronchus or lung: Secondary | ICD-10-CM

## 2017-12-19 DIAGNOSIS — I509 Heart failure, unspecified: Secondary | ICD-10-CM | POA: Diagnosis not present

## 2017-12-19 DIAGNOSIS — E041 Nontoxic single thyroid nodule: Secondary | ICD-10-CM | POA: Diagnosis not present

## 2017-12-19 DIAGNOSIS — Z5112 Encounter for antineoplastic immunotherapy: Secondary | ICD-10-CM | POA: Diagnosis not present

## 2017-12-19 DIAGNOSIS — C3432 Malignant neoplasm of lower lobe, left bronchus or lung: Secondary | ICD-10-CM

## 2017-12-19 LAB — CBC WITH DIFFERENTIAL/PLATELET
BASOS PCT: 0 %
Basophils Absolute: 0 10*3/uL (ref 0.0–0.1)
EOS ABS: 0.2 10*3/uL (ref 0.0–0.5)
EOS PCT: 3 %
HCT: 37.9 % — ABNORMAL LOW (ref 38.4–49.9)
Hemoglobin: 11.5 g/dL — ABNORMAL LOW (ref 13.0–17.1)
LYMPHS ABS: 1.3 10*3/uL (ref 0.9–3.3)
Lymphocytes Relative: 22 %
MCH: 24.4 pg — AB (ref 27.2–33.4)
MCHC: 30.3 g/dL — AB (ref 32.0–36.0)
MCV: 80.3 fL (ref 79.3–98.0)
MONOS PCT: 8 %
Monocytes Absolute: 0.5 10*3/uL (ref 0.1–0.9)
Neutro Abs: 3.9 10*3/uL (ref 1.5–6.5)
Neutrophils Relative %: 67 %
PLATELETS: 233 10*3/uL (ref 140–400)
RBC: 4.72 MIL/uL (ref 4.20–5.82)
RDW: 16 % — ABNORMAL HIGH (ref 11.0–14.6)
WBC: 5.9 10*3/uL (ref 4.0–10.3)

## 2017-12-19 LAB — COMPREHENSIVE METABOLIC PANEL
ALBUMIN: 3.1 g/dL — AB (ref 3.5–5.0)
ALT: <6 U/L (ref 0–55)
ANION GAP: 11 (ref 3–11)
AST: 10 U/L (ref 5–34)
Alkaline Phosphatase: 79 U/L (ref 40–150)
BUN: 14 mg/dL (ref 7–26)
CHLORIDE: 100 mmol/L (ref 98–109)
CO2: 29 mmol/L (ref 22–29)
Calcium: 9.8 mg/dL (ref 8.4–10.4)
Creatinine, Ser: 1.13 mg/dL (ref 0.70–1.30)
GFR calc non Af Amer: >60 mL/min (ref >60)
Glucose, Bld: 98 mg/dL (ref 70–140)
POTASSIUM: 4 mmol/L (ref 3.5–5.1)
SODIUM: 140 mmol/L (ref 136–145)
Total Bilirubin: 0.3 mg/dL (ref 0.2–1.2)
Total Protein: 8.7 g/dL — ABNORMAL HIGH (ref 6.4–8.3)

## 2017-12-19 MED ORDER — SODIUM CHLORIDE 0.9% FLUSH
10.0000 mL | INTRAVENOUS | Status: DC | PRN
  Administered 2017-12-19: 10 mL
  Filled 2017-12-19: qty 10

## 2017-12-19 MED ORDER — SODIUM CHLORIDE 0.9 % IV SOLN
860.0000 mg | Freq: Once | INTRAVENOUS | Status: AC
  Administered 2017-12-19: 860 mg via INTRAVENOUS
  Filled 2017-12-19: qty 7.2

## 2017-12-19 MED ORDER — SODIUM CHLORIDE 0.9 % IV SOLN
Freq: Once | INTRAVENOUS | Status: AC
  Administered 2017-12-19: 13:00:00 via INTRAVENOUS

## 2017-12-19 MED ORDER — HEPARIN SOD (PORK) LOCK FLUSH 100 UNIT/ML IV SOLN
500.0000 [IU] | Freq: Once | INTRAVENOUS | Status: AC | PRN
  Administered 2017-12-19: 500 [IU]
  Filled 2017-12-19: qty 5

## 2017-12-19 MED ORDER — POTASSIUM CHLORIDE CRYS ER 20 MEQ PO TBCR: 20.0000 meq | EXTENDED_RELEASE_TABLET | Freq: Every day | ORAL | 0 refills | Status: DC

## 2017-12-19 NOTE — Assessment & Plan Note (Addendum)
This is a very pleasant 62 year old African-American male recently diagnosed with a stage IIIa non-small cell lung cancer, adenocarcinoma presented with left lower lobe lung mass in addition to mediastinal lymphadenopathy.  The patient underwent a course of concurrent chemoradiation with weekly carboplatin and paclitaxel status post 7 cycles and tolerated his treatment fairly well. He is currently undergoing treatment with consolidation immunotherapy with Imfinzi (Durvalumab) status post 9 cycles.   The patient continues to tolerate his treatment fairly well with no concerning complaints. I recommended for him to proceed with cycle #10 today as a scheduled. The patient will follow up in 2 weeks for evaluation prior to cycle #11 of his treatment.  The patient reports that he is out of his potassium which is been prescribed by cardiology.  I have given him 30 tablets with no additional refills and he will follow-up with cardiology for further refills.  He was advised to call immediately if he has any concerning symptoms in the interval. The patient voices understanding of current disease status and treatment options and is in agreement with the current care plan. All questions were answered. The patient knows to call the clinic with any problems, questions or concerns. We can certainly see the patient much sooner if necessary.

## 2017-12-19 NOTE — Patient Instructions (Signed)
[facility] Discharge Instructions for Patients Receiving Chemotherapy  Today you received the following chemotherapy agents:  Durvalumab (Imfinzi)  To help prevent nausea and vomiting after your treatment, we encourage you to take your nausea medication as prescribed.   If you develop nausea and vomiting that is not controlled by your nausea medication, call the clinic.   BELOW ARE SYMPTOMS THAT SHOULD BE REPORTED IMMEDIATELY:  *FEVER GREATER THAN 100.5 F  *CHILLS WITH OR WITHOUT FEVER  NAUSEA AND VOMITING THAT IS NOT CONTROLLED WITH YOUR NAUSEA MEDICATION  *UNUSUAL SHORTNESS OF BREATH  *UNUSUAL BRUISING OR BLEEDING  TENDERNESS IN MOUTH AND THROAT WITH OR WITHOUT PRESENCE OF ULCERS  *URINARY PROBLEMS  *BOWEL PROBLEMS  UNUSUAL RASH Items with * indicate a potential emergency and should be followed up as soon as possible.  Feel free to call the clinic should you have any questions or concerns. The clinic phone number is [phone].  Please show the CHEMO ALERT CARD at check-in to the Emergency Department and triage nurse.   

## 2017-12-19 NOTE — Telephone Encounter (Signed)
Appt per 2/20 los - already scheduled no additional appts to add.

## 2017-12-19 NOTE — Progress Notes (Signed)
62 year old male diagnosed with non-small cell lung cancer.  Past medical history includes CHF, chronic fatigue syndrome, obesity, perforated bowel.  Medications include Lasix.  Labs include albumin 2.9.  Height: 5 feet 6 inches. Weight: 198.4 pounds. BMI: 32.02  Patient denies nutrition Impact symptoms associated with treatment for lung cancer. Reports he is eating well and has a good appetite. He states he used to drink oral nutrition supplements but has run out of them.  He would like to get more.   Nutrition diagnosis: None at this time  Educated patient to strive for weight maintenance.  Provided coupons for boost and Ensure.  I gave him my contact information in case he develops questions or concerns.  **Disclaimer: This note was dictated with voice recognition software. Similar sounding words can inadvertently be transcribed and this note may contain transcription errors which may not have been corrected upon publication of note.**

## 2017-12-19 NOTE — Progress Notes (Signed)
[place] OFFICE PROGRESS NOTE  James Beard, MD [address] [place] [address]  DIAGNOSIS: Stage IIIA (T2a, N2, M0) non-small cell lung cancer, adenocarcinoma presented with left lower lobe lung mass in addition to mediastinal lymphadenopathy diagnosed in June 2018. The patient also has suspicious soft tissue density in the pancreatic head but this has been present for 3 years on previous CT scan and unlikely to be related to [name], last dose was given 07/03/2017.  CURRENT THERAPY: Consolidation treatment with immunotherapy with Imfinzi (Durvalumab) 10 MG/KG every 2 weeks, first dose 08/15/2017.  Status post 9 cycles.  INTERVAL HISTORY: James Zamora 62 y.o. male returns for routine follow-up visit accompanied by his sister.  The patient is feeling fine today and has no specific complaints.  He continues to tolerate his treatment with immunotherapy with Imfinzi fairly well.  He denies fevers and chills.  Denies chest pain, shortness breath at rest, cough, hemoptysis.  He reports dyspnea on exertion on continues to wear home oxygen.  Denies nausea, vomiting, constipation, diarrhea.  The patient is here for evaluation prior to starting cycle #10 of his treatment.  MEDICAL HISTORY: Past Medical History:  Diagnosis Date  . Adenocarcinoma of left lung, stage 3 ([place]) 05/10/2017  . Aortic atherosclerosis ([place]) 07/31/2017  . Asthma   . Chronic diastolic CHF (congestive heart failure) ([place])   . Chronic fatigue 08/02/2017  . Lung mass   . Non-small cell lung cancer ([place]) 11/13/2015  . Obesity   . Perforated bowel ([place])     ALLERGIES:  is allergic to bee venom and penicillins.  MEDICATIONS:  Current Outpatient Medications  Medication Sig Dispense Refill  . aspirin EC 81 MG EC tablet Take 1 tablet (81 mg  total) by mouth daily. 30 tablet 1  . enoxaparin (LOVENOX) 150 MG/ML injection Inject 0.9 mLs (135 mg total) into the skin daily. 30 Syringe 1  . fluticasone furoate-vilanterol (BREO ELLIPTA) 200-25 MCG/INH AEPB Inhale 1 puff into the lungs daily. (Patient not taking: Reported on 11/07/2017) 1 each 5  . furosemide (LASIX) 40 MG tablet Take 1 tablet (40 mg total) by mouth daily. (Patient taking differently: Take 20 mg by mouth daily. ) 90 tablet 3  . lidocaine-prilocaine (EMLA) cream Apply 1 application topically as needed. Apply small amount to port site 1-1.5 hours before treatment. Cover with plastic wrap. 30 g 0  . OXYGEN Inhale 3 L into the lungs continuous.    . potassium chloride SA (KLOR-CON M20) 20 MEQ tablet Take 1 tablet (20 mEq total) by mouth daily. 30 tablet 0   No current facility-administered medications for this visit.     SURGICAL HISTORY:  Past Surgical History:  Procedure Laterality Date  . CHOLECYSTECTOMY    . COLONOSCOPY N/A 07/31/2016   Procedure: COLONOSCOPY;  Surgeon: Danie Binder, MD;  [facility];  Service: Endoscopy;  Laterality: N/A;  1:45 pm  . ESOPHAGOGASTRODUODENOSCOPY N/A 07/31/2016   Procedure: ESOPHAGOGASTRODUODENOSCOPY (EGD);  Surgeon: Danie Binder, MD;  Location[facility];  Service: Endoscopy;  Laterality: N/A;  . EUS N/A 06/07/2017   Procedure: UPPER ENDOSCOPIC ULTRASOUND (EUS) LINEAR;  Surgeon: Milus Banister, MD;  Location: [facility];  Service: Endoscopy;  Laterality: N/A;  . HERNIA REPAIR    . IR FLUORO GUIDE PORT INSERTION RIGHT  07/11/2017  . IR US GUIDE VASC ACCESS RIGHT  07/11/2017    REVIEW OF SYSTEMS:   Review of Systems  Constitutional: Negative for appetite change, chills, fatigue, fever and unexpected weight change.  HENT:   Negative for mouth sores, nosebleeds, sore throat and trouble swallowing.   Eyes: Negative for eye problems and icterus.  Respiratory: Negative for cough, hemoptysis, shortness of breath at rest and  wheezing.  Positive for shortness of breath with exertion. Cardiovascular: Negative for chest pain.  Positive for swelling in his lower extremities.  Gastrointestinal: Negative for abdominal pain, constipation, diarrhea, nausea and vomiting.  Genitourinary: Negative for bladder incontinence, difficulty urinating, dysuria, frequency and hematuria.   Musculoskeletal: Negative for back pain, gait problem, neck pain and neck stiffness.  Skin: Negative for itching and rash.  Neurological: Negative for dizziness, extremity weakness, gait problem, headaches, light-headedness and seizures.  Hematological: Negative for adenopathy. Does not bruise/bleed easily.  Psychiatric/Behavioral: Negative for confusion, depression and sleep disturbance. The patient is not nervous/anxious.     PHYSICAL EXAMINATION:  Blood pressure (!) 119/57, pulse (!) 118, temperature (!) 97.1 F (36.2 C), temperature source Oral, resp. rate 20, weight 198 lb 6.4 oz (90 kg), SpO2 96 %.  ECOG PERFORMANCE STATUS: 1 - Symptomatic but completely ambulatory  Physical Exam  Constitutional: Oriented to person, place, and time and well-developed, well-nourished, and in no distress. No distress.  HENT:  Head: Normocephalic and atraumatic.  Mouth/Throat: Oropharynx is clear and moist. No oropharyngeal exudate.  Eyes: Conjunctivae are normal. Right eye exhibits no discharge. Left eye exhibits no discharge. No scleral icterus.  Neck: Normal range of motion. Neck supple.  Cardiovascular: Normal rate, regular rhythm, normal heart sounds and intact distal pulses.  Plus bilateral pedal edema. Pulmonary/Chest: Effort normal and breath sounds normal. No respiratory distress. No wheezes. No rales.  Abdominal: Soft. Bowel sounds are normal. Exhibits no distension and no mass. There is no tenderness.  Musculoskeletal: Normal range of motion. Exhibits no edema.  Lymphadenopathy:    No cervical adenopathy.  Neurological: Alert and oriented to  person, place, and time. Exhibits normal muscle tone. Gait normal. Coordination normal.  Skin: Skin is warm and dry. No rash noted. Not diaphoretic. No erythema. No pallor.  Psychiatric: Mood, memory and judgment normal.  Vitals reviewed.  LABORATORY DATA: Lab Results  Component Value Date   WBC 5.9 12/19/2017   HGB 11.5 (L) 12/19/2017   HCT 37.9 (L) 12/19/2017   MCV 80.3 12/19/2017   PLT 233 12/19/2017      Chemistry      Component Value Date/Time   NA 140 12/19/2017 1114   NA 139 09-17-202018 1313   K 4.0 12/19/2017 1114   K 3.8 09-17-202018 1313   CL 100 12/19/2017 1114   CO2 29 12/19/2017 1114   CO2 27 09-17-202018 1313   BUN 14 12/19/2017 1114   BUN 9.8 09-17-202018 1313   CREATININE 1.13 12/19/2017 1114   CREATININE 0.9 09-17-202018 1313      Component Value Date/Time   CALCIUM 9.8 12/19/2017 1114   CALCIUM 8.6 09-17-202018 1313   ALKPHOS 79 12/19/2017 1114   ALKPHOS 57 09-17-202018 1313   AST 10 12/19/2017 1114   AST 9 09-17-202018 1313   ALT <6 12/19/2017 1114   ALT 6 09-17-202018 1313   BILITOT 0.3 12/19/2017 1114   BILITOT 0.23 09-17-202018 1313       RADIOGRAPHIC STUDIES:  No results found.   ASSESSMENT/PLAN:  Non-small cell lung cancer (HCC) - Adenocarcinoma  This is a very pleasant 62 year old African-American male recently diagnosed with a stage IIIa non-small cell lung cancer, adenocarcinoma presented with left lower lobe lung mass in addition to mediastinal lymphadenopathy.  The patient underwent a course of concurrent chemoradiation with weekly carboplatin and paclitaxel status post 7 cycles and tolerated his treatment fairly well. He is currently undergoing treatment with consolidation immunotherapy with Imfinzi (Durvalumab) status post 9 cycles.   The patient continues to tolerate his treatment fairly well with no concerning complaints. I recommended for him to proceed with cycle #10 today as a scheduled. The patient will follow up in 2 weeks for evaluation  prior to cycle #11 of his treatment.  The patient reports that he is out of his potassium which is been prescribed by cardiology.  I have given him 30 tablets with no additional refills and he will follow-up with cardiology for further refills.  He was advised to call immediately if he has any concerning symptoms in the interval. The patient voices understanding of current disease status and treatment options and is in agreement with the current care plan. All questions were answered. The patient knows to call the clinic with any problems, questions or concerns. We can certainly see the patient much sooner if necessary.  No orders of the defined types were placed in this encounter.   Mikey Bussing, DNP, AGPCNP-BC, AOCNP 12/19/17

## 2018-01-02 ENCOUNTER — Inpatient Hospital Stay (HOSPITAL_BASED_OUTPATIENT_CLINIC_OR_DEPARTMENT_OTHER): Payer: Medicare Other | Admitting: Oncology

## 2018-01-02 ENCOUNTER — Telehealth: Payer: Self-pay | Admitting: Oncology

## 2018-01-02 ENCOUNTER — Inpatient Hospital Stay: Payer: Medicare Other | Attending: Internal Medicine

## 2018-01-02 ENCOUNTER — Inpatient Hospital Stay: Payer: Medicare Other

## 2018-01-02 ENCOUNTER — Encounter: Payer: Self-pay | Admitting: Oncology

## 2018-01-02 VITALS — BP 108/63 | HR 117 | Temp 97.7°F | Resp 20 | Ht 66.0 in | Wt 194.5 lb

## 2018-01-02 DIAGNOSIS — R590 Localized enlarged lymph nodes: Secondary | ICD-10-CM | POA: Diagnosis not present

## 2018-01-02 DIAGNOSIS — Z7901 Long term (current) use of anticoagulants: Secondary | ICD-10-CM | POA: Diagnosis not present

## 2018-01-02 DIAGNOSIS — C3432 Malignant neoplasm of lower lobe, left bronchus or lung: Secondary | ICD-10-CM

## 2018-01-02 DIAGNOSIS — Z5112 Encounter for antineoplastic immunotherapy: Secondary | ICD-10-CM

## 2018-01-02 DIAGNOSIS — C3492 Malignant neoplasm of unspecified part of left bronchus or lung: Secondary | ICD-10-CM

## 2018-01-02 DIAGNOSIS — R5382 Chronic fatigue, unspecified: Secondary | ICD-10-CM

## 2018-01-02 DIAGNOSIS — Z86711 Personal history of pulmonary embolism: Secondary | ICD-10-CM | POA: Insufficient documentation

## 2018-01-02 DIAGNOSIS — I2692 Saddle embolus of pulmonary artery without acute cor pulmonale: Secondary | ICD-10-CM | POA: Diagnosis not present

## 2018-01-02 DIAGNOSIS — Z79899 Other long term (current) drug therapy: Secondary | ICD-10-CM | POA: Diagnosis not present

## 2018-01-02 LAB — CBC WITH DIFFERENTIAL/PLATELET
BASOS ABS: 0 10*3/uL (ref 0.0–0.1)
Basophils Relative: 0 %
Eosinophils Absolute: 0.2 10*3/uL (ref 0.0–0.5)
Eosinophils Relative: 4 %
HEMATOCRIT: 36.2 % — AB (ref 38.4–49.9)
HEMOGLOBIN: 11 g/dL — AB (ref 13.0–17.1)
LYMPHS ABS: 1.3 10*3/uL (ref 0.9–3.3)
LYMPHS PCT: 21 %
MCH: 24 pg — AB (ref 27.2–33.4)
MCHC: 30.4 g/dL — ABNORMAL LOW (ref 32.0–36.0)
MCV: 79 fL — AB (ref 79.3–98.0)
Monocytes Absolute: 0.5 10*3/uL (ref 0.1–0.9)
Monocytes Relative: 9 %
NEUTROS ABS: 4 10*3/uL (ref 1.5–6.5)
NEUTROS PCT: 66 %
PLATELETS: 218 10*3/uL (ref 140–400)
RBC: 4.58 MIL/uL (ref 4.20–5.82)
RDW: 16.9 % — ABNORMAL HIGH (ref 11.0–14.6)
WBC: 6 10*3/uL (ref 4.0–10.3)

## 2018-01-02 LAB — COMPREHENSIVE METABOLIC PANEL
ALK PHOS: 82 U/L (ref 40–150)
AST: 8 U/L (ref 5–34)
Albumin: 3.1 g/dL — ABNORMAL LOW (ref 3.5–5.0)
Anion gap: 9 (ref 3–11)
BILIRUBIN TOTAL: 0.3 mg/dL (ref 0.2–1.2)
BUN: 12 mg/dL (ref 7–26)
CALCIUM: 9.5 mg/dL (ref 8.4–10.4)
CHLORIDE: 101 mmol/L (ref 98–109)
CO2: 28 mmol/L (ref 22–29)
CREATININE: 1.08 mg/dL (ref 0.70–1.30)
GFR calc Af Amer: >60 mL/min (ref >60)
Glucose, Bld: 88 mg/dL (ref 70–140)
Potassium: 4.3 mmol/L (ref 3.5–5.1)
Sodium: 138 mmol/L (ref 136–145)
Total Protein: 8.4 g/dL — ABNORMAL HIGH (ref 6.4–8.3)

## 2018-01-02 LAB — TSH: TSH: 0.709 u[IU]/mL (ref 0.320–4.118)

## 2018-01-02 MED ORDER — SODIUM CHLORIDE 0.9 % IV SOLN
9.7000 mg/kg | Freq: Once | INTRAVENOUS | Status: AC
  Administered 2018-01-02: 860 mg via INTRAVENOUS
  Filled 2018-01-02: qty 10

## 2018-01-02 MED ORDER — SODIUM CHLORIDE 0.9% FLUSH
10.0000 mL | INTRAVENOUS | Status: DC | PRN
  Administered 2018-01-02: 10 mL
  Filled 2018-01-02: qty 10

## 2018-01-02 MED ORDER — SODIUM CHLORIDE 0.9 % IV SOLN
Freq: Once | INTRAVENOUS | Status: AC
  Administered 2018-01-02: 16:00:00 via INTRAVENOUS

## 2018-01-02 MED ORDER — RIVAROXABAN 20 MG PO TABS: 20.0000 mg | ORAL_TABLET | Freq: Every day | ORAL | 1 refills | Status: DC

## 2018-01-02 MED ORDER — HEPARIN SOD (PORK) LOCK FLUSH 100 UNIT/ML IV SOLN
500.0000 [IU] | Freq: Once | INTRAVENOUS | Status: AC | PRN
  Administered 2018-01-02: 500 [IU]
  Filled 2018-01-02: qty 5

## 2018-01-02 MED ORDER — RIVAROXABAN (XARELTO) VTE STARTER PACK (15 & 20 MG): ORAL_TABLET | ORAL | 0 refills | Status: DC

## 2018-01-02 NOTE — Assessment & Plan Note (Addendum)
This is a very pleasant 62 year old African-American male recently diagnosed with a stage IIIa non-small cell lung cancer, adenocarcinoma presented with left lower lobe lung mass in addition to mediastinal lymphadenopathy.  The patient underwent a course of concurrent chemoradiation with weekly carboplatin and paclitaxel status post 7 cycles and tolerated his treatment fairly well. He is currently undergoing treatment with consolidation immunotherapy with Imfinzi (Durvalumab) status post10cycles.  The patient continues to tolerate his treatment fairly well with no concerning complaints. I recommended for him to proceed with cycle #11 today as a scheduled. The patient will follow up in 2 weeks for evaluation prior to cycle #12 of his treatment.

## 2018-01-02 NOTE — Patient Instructions (Signed)
[facility] Discharge Instructions for Patients Receiving Chemotherapy  Today you received the following chemotherapy agents:  Durvalumab (Imfinzi)  To help prevent nausea and vomiting after your treatment, we encourage you to take your nausea medication as prescribed.   If you develop nausea and vomiting that is not controlled by your nausea medication, call the clinic.   BELOW ARE SYMPTOMS THAT SHOULD BE REPORTED IMMEDIATELY:  *FEVER GREATER THAN 100.5 F  *CHILLS WITH OR WITHOUT FEVER  NAUSEA AND VOMITING THAT IS NOT CONTROLLED WITH YOUR NAUSEA MEDICATION  *UNUSUAL SHORTNESS OF BREATH  *UNUSUAL BRUISING OR BLEEDING  TENDERNESS IN MOUTH AND THROAT WITH OR WITHOUT PRESENCE OF ULCERS  *URINARY PROBLEMS  *BOWEL PROBLEMS  UNUSUAL RASH Items with * indicate a potential emergency and should be followed up as soon as possible.  Feel free to call the clinic should you have any questions or concerns. The clinic phone number is [phone].  Please show the CHEMO ALERT CARD at check-in to the Emergency Department and triage nurse.   

## 2018-01-02 NOTE — Telephone Encounter (Signed)
Added flush appts per 3/6 los.

## 2018-01-02 NOTE — Assessment & Plan Note (Signed)
The patient has a diagnosis of pulmonary embolism.  He was prescribed Lovenox 135 mg subcu daily.  His family member reports that he has not taken this for over 2 weeks.  The patient has stopped therapy in the past on his own without notifying us.  The patient does not want to take injections.  Discussed use of Xarelto with the patient and his sister and he is agreeable to taking this.  The patient was given a prescription for Xarelto starter pack followed by Xarelto 20 mg daily once the starter pack is complete.  He was advised not to stop this medication on his own.  Plan reviewed with Dr. Julien Nordmann.  He was advised to call immediately if he has any concerning symptoms in the interval. The patient voices understanding of current disease status and treatment options and is in agreement with the current care plan. All questions were answered. The patient knows to call the clinic with any problems, questions or concerns. We can certainly see the patient much sooner if necessary.

## 2018-01-02 NOTE — Progress Notes (Signed)
[facility] OFF[facility] PROGRESS NOTE  [name], MD [address] [place] [address]  DIAGNOSIS:  1) Stage IIIA (T2a, N2, M0) non-small cell lung cancer, adenocarcinoma presented with left lower lobe lung mass in addition to mediastinal lymphadenopathy diagnosed in June 2018. The patient also has suspicious soft tissue density in the pancreatic head but this has been present for 3 years on previous CT scan and unlikely to be related to his current diagnosis of the lung cancer. 2) pulmonary embolism diagnosed on CT scan on 07/30/2017.  PRIOR THERAPY:  1) Concurrent chemoradiation with weekly carboplatin for AUC of 2 and paclitaxel 45 MG/M2. Status post 7 cycles, last dose was given 07/03/2017. 2) Lovenox 135 mg subcu daily.  This was stopped due to patient's refusal to take injections.  CURRENT THERAPY:  1) Consolidation treatment with immunotherapy with Imfinzi (Durvalumab) 10 MG/KG every 2 weeks, first dose 08/15/2017. Status post 10cycles. 2) Xarelto 15 mg twice daily times 21 days followed by 20 mg daily.  INTERVAL HISTORY: James Zamora 62 y.o. male returns for routine follow-up visit accompanied by his sister.  The patient is feeling fine today has no specific complaints.  He continues to tolerate his treatment with immunotherapy fairly well.  He denies fevers and chills.  Denies chest pain, shortness of breath at rest, cough, hemoptysis.  He reports ongoing dyspnea on exertion and continues wears home oxygen.  Denies nausea, vomiting, constipation, diarrhea.  The patient's sister tells me that he has not been taking his Lovenox as instructed.  She checked with family members, and he has not taken a Lovenox injection approximately 2 weeks.  Patient states that he does not like the injections going into his abdomen.  Would consider taking an oral agent for treatment of his pulmonary embolism.  The patient is here for evaluation prior to starting cycle #11 of his  treatment.  MEDICAL HISTORY: Past Medical History:  Diagnosis Date  . Adenocarcinoma of left lung, stage 3 ([place]) 05/10/2017  . Aortic atherosclerosis ([place]) 07/31/2017  . Asthma   . Chronic diastolic CHF (congestive heart failure) ([place])   . Chronic fatigue 08/02/2017  . Lung mass   . Non-small cell lung cancer ([place]) 11/13/2015  . Obesity   . Perforated bowel ([place])     ALLERGIES:  is allergic to bee venom and penicillins.  MEDICATIONS:  Current Outpatient Medications  Medication Sig Dispense Refill  . aspirin EC 81 MG EC tablet Take 1 tablet (81 mg total) by mouth daily. 30 tablet 1  . fluticasone furoate-vilanterol (BREO ELLIPTA) 200-25 MCG/INH AEPB Inhale 1 puff into the lungs daily. (Patient not taking: Reported on 11/07/2017) 1 each 5  . furosemide (LASIX) 40 MG tablet Take 1 tablet (40 mg total) by mouth daily. (Patient taking differently: Take 20 mg by mouth daily. ) 90 tablet 3  . lidocaine-prilocaine (EMLA) cream Apply 1 application topically as needed. Apply small amount to port site 1-1.5 hours before treatment. Cover with plastic wrap. 30 g 0  . OXYGEN Inhale 3 L into the lungs continuous.    . potassium chloride SA (KLOR-CON M20) 20 MEQ tablet Take 1 tablet (20 mEq total) by mouth daily. 30 tablet 0  . rivaroxaban (XARELTO) 20 MG TABS tablet Take 1 tablet (20 mg total) by mouth daily with supper. Begin 20 mg daily after starter pack is complete. 30 tablet 1  . Rivaroxaban 15 & 20 MG TBPK Take as directed on package: Start with  one 15mg  tablet by mouth twice a day with food. On Day 22, switch to one 20mg  tablet once a day with food. 51 each 0   No current facility-administered medications for this visit.    Facility-Administered Medications Ordered in Other Visits  Medication Dose Route Frequency Provider Last Rate Last Dose  . durvalumab (IMFINZI) 860 mg in sodium chloride 0.9 % 100 mL chemo infusion  9.7 mg/kg (Treatment Plan) Intravenous Once Curt Bears, MD 117 mL/hr at  01/02/18 1544 860 mg at 01/02/18 1544  . heparin lock flush 100 unit/mL  500 Units Intracatheter Once PRN Curt Bears, MD      . sodium chloride flush (NS) 0.9 % injection 10 mL  10 mL Intracatheter PRN Curt Bears, MD        SURGICAL HISTORY:  Past Surgical History:  Procedure Laterality Date  . CHOLECYSTECTOMY    . COLONOSCOPY N/A 07/31/2016   Procedure: COLONOSCOPY;  Surgeon: Danie Binder, MD;  [facility];  Service: Endoscopy;  Laterality: N/A;  1:45 pm  . ESOPHAGOGASTRODUODENOSCOPY N/A 07/31/2016   Procedure: ESOPHAGOGASTRODUODENOSCOPY (EGD);  Surgeon: Danie Binder, MD;  [facility];  Service: Endoscopy;  Laterality: N/A;  . EUS N/A 06/07/2017   Procedure: UPPER ENDOSCOPIC ULTRASOUND (EUS) LINEAR;  Surgeon: Milus Banister, MD;  [facility];  Service: Endoscopy;  Laterality: N/A;  . HERNIA REPAIR    . IR FLUORO GUIDE PORT INSERTION RIGHT  07/11/2017  . IR US GUIDE VASC ACCESS RIGHT  07/11/2017    REVIEW OF SYSTEMS:   Review of Systems  Constitutional: Negative for appetite change, chills, fatigue, fever and unexpected weight change.  HENT:   Negative for mouth sores, nosebleeds, sore throat and trouble swallowing.   Eyes: Negative for eye problems and icterus.  Respiratory: Negative for cough, hemoptysis, shortness of breath at rest and wheezing.  Positive for shortness of breath with exertion.  Wears home oxygen. Cardiovascular: Negative for chest pain.  Positive for lower extremity edema.  Gastrointestinal: Negative for abdominal pain, constipation, diarrhea, nausea and vomiting.  Genitourinary: Negative for bladder incontinence, difficulty urinating, dysuria, frequency and hematuria.   Musculoskeletal: Negative for back pain, gait problem, neck pain and neck stiffness.  Skin: Negative for itching and rash.  Neurological: Negative for dizziness, extremity weakness, gait problem, headaches, light-headedness and seizures.   Hematological: Negative for adenopathy. Does not bruise/bleed easily.  Psychiatric/Behavioral: Negative for confusion, depression and sleep disturbance. The patient is not nervous/anxious.     PHYSICAL EXAMINATION:  Blood pressure 108/63, pulse (!) 117, temperature 97.7 F (36.5 C), temperature source Oral, resp. rate 20, height 5\' 6"  (1.676 m), weight 194 lb 8 oz (88.2 kg), SpO2 98 %.  ECOG PERFORMANCE STATUS: 1 - Symptomatic but completely ambulatory  Physical Exam  Constitutional: Oriented to person, place, and time and well-developed, well-nourished, and in no distress. No distress.  HENT:  Head: Normocephalic and atraumatic.  Mouth/Throat: Oropharynx is clear and moist. No oropharyngeal exudate.  Eyes: Conjunctivae are normal. Right eye exhibits no discharge. Left eye exhibits no discharge. No scleral icterus.  Neck: Normal range of motion. Neck supple.  Cardiovascular: Normal rate, regular rhythm, normal heart sounds and intact distal pulses.  1+ bilateral lower extremity edema. Pulmonary/Chest: Effort normal and breath sounds normal. No respiratory distress. No wheezes. No rales.  Abdominal: Soft. Bowel sounds are normal. Exhibits no distension and no mass. There is no tenderness.  Musculoskeletal: Normal range of motion.   Lymphadenopathy:  No cervical adenopathy.  Neurological: Alert and oriented to person, place, and time. Exhibits normal muscle tone. Gait normal. Coordination normal.  Skin: Skin is warm and dry. No rash noted. Not diaphoretic. No erythema. No pallor.  Psychiatric: Mood, memory and judgment normal.  Vitals reviewed.  LABORATORY DATA: Lab Results  Component Value Date   WBC 6.0 01/02/2018   HGB 11.0 (L) 01/02/2018   HCT 36.2 (L) 01/02/2018   MCV 79.0 (L) 01/02/2018   PLT 218 01/02/2018      Chemistry      Component Value Date/Time   NA 138 01/02/2018 1334   NA 139 07/22/2017 1313   K 4.3 01/02/2018 1334   K 3.8 07/22/2017 1313   CL 101  01/02/2018 1334   CO2 28 01/02/2018 1334   CO2 27 07/22/2017 1313   BUN 12 01/02/2018 1334   BUN 9.8 07/22/2017 1313   CREATININE 1.08 01/02/2018 1334   CREATININE 0.9 07/22/2017 1313      Component Value Date/Time   CALCIUM 9.5 01/02/2018 1334   CALCIUM 8.6 07/22/2017 1313   ALKPHOS 82 01/02/2018 1334   ALKPHOS 57 07/22/2017 1313   AST 8 01/02/2018 1334   AST 9 07/22/2017 1313   ALT <6 01/02/2018 1334   ALT 6 07/22/2017 1313   BILITOT 0.3 01/02/2018 1334   BILITOT 0.23 07/22/2017 1313       RADIOGRAPHIC STUDIES:  No results found.   ASSESSMENT/PLAN:  Adenocarcinoma of left lung, stage 3 (HCC) This is a very pleasant 62 year old African-American male recently diagnosed with a stage IIIa non-small cell lung cancer, adenocarcinoma presented with left lower lobe lung mass in addition to mediastinal lymphadenopathy.  The patient underwent a course of concurrent chemoradiation with weekly carboplatin and paclitaxel status post 7 cycles and tolerated his treatment fairly well. He is currently undergoing treatment with consolidation immunotherapy with Imfinzi (Durvalumab) status post10cycles.  The patient continues to tolerate his treatment fairly well with no concerning complaints. I recommended for him to proceed with cycle #11 today as a scheduled. The patient will follow up in 2 weeks for evaluation prior to cycle #12 of his treatment.  Pulmonary embolism ([place]) The patient has a diagnosis of pulmonary embolism.  He was prescribed Lovenox 135 mg subcu daily.  His family member reports that he has not taken this for over 2 weeks.  The patient has stopped therapy in the past on his own without notifying us.  The patient does not want to take injections.  Discussed use of Xarelto with the patient and his sister and he is agreeable to taking this.  The patient was given a prescription for Xarelto starter pack followed by Xarelto 20 mg daily once the starter pack is complete.  He was  advised not to stop this medication on his own.  Plan reviewed with Dr. Julien Nordmann.  He was advised to call immediately if he has any concerning symptoms in the interval. The patient voices understanding of current disease status and treatment options and is in agreement with the current care plan. All questions were answered. The patient knows to call the clinic with any problems, questions or concerns. We can certainly see the patient much sooner if necessary.   No orders of the defined types were placed in this encounter.   Mikey Bussing, DNP, AGPCNP-BC, AOCNP 01/02/18

## 2018-01-14 ENCOUNTER — Ambulatory Visit: Payer: Medicare Other | Admitting: Internal Medicine

## 2018-01-16 ENCOUNTER — Inpatient Hospital Stay: Payer: Medicare Other

## 2018-01-16 ENCOUNTER — Inpatient Hospital Stay (HOSPITAL_BASED_OUTPATIENT_CLINIC_OR_DEPARTMENT_OTHER): Payer: Medicare Other | Admitting: Internal Medicine

## 2018-01-16 ENCOUNTER — Encounter: Payer: Self-pay | Admitting: Internal Medicine

## 2018-01-16 DIAGNOSIS — R590 Localized enlarged lymph nodes: Secondary | ICD-10-CM | POA: Diagnosis not present

## 2018-01-16 DIAGNOSIS — I2692 Saddle embolus of pulmonary artery without acute cor pulmonale: Secondary | ICD-10-CM | POA: Diagnosis not present

## 2018-01-16 DIAGNOSIS — Z5112 Encounter for antineoplastic immunotherapy: Secondary | ICD-10-CM | POA: Diagnosis not present

## 2018-01-16 DIAGNOSIS — C3432 Malignant neoplasm of lower lobe, left bronchus or lung: Secondary | ICD-10-CM

## 2018-01-16 DIAGNOSIS — Z79899 Other long term (current) drug therapy: Secondary | ICD-10-CM

## 2018-01-16 DIAGNOSIS — Z86711 Personal history of pulmonary embolism: Secondary | ICD-10-CM | POA: Diagnosis not present

## 2018-01-16 DIAGNOSIS — C3492 Malignant neoplasm of unspecified part of left bronchus or lung: Secondary | ICD-10-CM

## 2018-01-16 DIAGNOSIS — C3490 Malignant neoplasm of unspecified part of unspecified bronchus or lung: Secondary | ICD-10-CM

## 2018-01-16 DIAGNOSIS — Z7901 Long term (current) use of anticoagulants: Secondary | ICD-10-CM | POA: Diagnosis not present

## 2018-01-16 LAB — COMPREHENSIVE METABOLIC PANEL
ALBUMIN: 3.1 g/dL — AB (ref 3.5–5.0)
ALT: 7 U/L (ref 0–55)
AST: 11 U/L (ref 5–34)
Alkaline Phosphatase: 84 U/L (ref 40–150)
Anion gap: 8 (ref 3–11)
BUN: 9 mg/dL (ref 7–26)
CHLORIDE: 99 mmol/L (ref 98–109)
CO2: 27 mmol/L (ref 22–29)
Calcium: 9.4 mg/dL (ref 8.4–10.4)
Creatinine, Ser: 1.08 mg/dL (ref 0.70–1.30)
GFR calc Af Amer: >60 mL/min (ref >60)
GLUCOSE: 105 mg/dL (ref 70–140)
Potassium: 3.8 mmol/L (ref 3.5–5.1)
Sodium: 134 mmol/L — ABNORMAL LOW (ref 136–145)
Total Bilirubin: 0.3 mg/dL (ref 0.2–1.2)
Total Protein: 8.3 g/dL (ref 6.4–8.3)

## 2018-01-16 LAB — CBC WITH DIFFERENTIAL/PLATELET
Basophils Absolute: 0 10*3/uL (ref 0.0–0.1)
Basophils Relative: 0 %
EOS PCT: 3 %
Eosinophils Absolute: 0.2 10*3/uL (ref 0.0–0.5)
HCT: 34.1 % — ABNORMAL LOW (ref 38.4–49.9)
Hemoglobin: 10.7 g/dL — ABNORMAL LOW (ref 13.0–17.1)
LYMPHS ABS: 1.1 10*3/uL (ref 0.9–3.3)
Lymphocytes Relative: 18 %
MCH: 23.9 pg — AB (ref 27.2–33.4)
MCHC: 31.4 g/dL — AB (ref 32.0–36.0)
MCV: 76.2 fL — AB (ref 79.3–98.0)
MONO ABS: 0.6 10*3/uL (ref 0.1–0.9)
Monocytes Relative: 9 %
Neutro Abs: 4.4 10*3/uL (ref 1.5–6.5)
Neutrophils Relative %: 70 %
PLATELETS: 301 10*3/uL (ref 140–400)
RBC: 4.47 MIL/uL (ref 4.20–5.82)
RDW: 18 % — AB (ref 11.0–14.6)
WBC: 6.2 10*3/uL (ref 4.0–10.3)

## 2018-01-16 MED ORDER — SODIUM CHLORIDE 0.9 % IV SOLN
Freq: Once | INTRAVENOUS | Status: AC
  Administered 2018-01-16: 11:00:00 via INTRAVENOUS

## 2018-01-16 MED ORDER — SODIUM CHLORIDE 0.9% FLUSH
10.0000 mL | Freq: Once | INTRAVENOUS | Status: AC
  Administered 2018-01-16: 10 mL
  Filled 2018-01-16: qty 10

## 2018-01-16 MED ORDER — SODIUM CHLORIDE 0.9 % IV SOLN
860.0000 mg | Freq: Once | INTRAVENOUS | Status: AC
  Administered 2018-01-16: 860 mg via INTRAVENOUS
  Filled 2018-01-16: qty 10

## 2018-01-16 MED ORDER — SODIUM CHLORIDE 0.9% FLUSH
10.0000 mL | INTRAVENOUS | Status: DC | PRN
  Administered 2018-01-16: 10 mL
  Filled 2018-01-16: qty 10

## 2018-01-16 MED ORDER — HEPARIN SOD (PORK) LOCK FLUSH 100 UNIT/ML IV SOLN
500.0000 [IU] | Freq: Once | INTRAVENOUS | Status: DC
  Filled 2018-01-16: qty 5

## 2018-01-16 MED ORDER — HEPARIN SOD (PORK) LOCK FLUSH 100 UNIT/ML IV SOLN
500.0000 [IU] | Freq: Once | INTRAVENOUS | Status: AC | PRN
  Administered 2018-01-16: 500 [IU]
  Filled 2018-01-16: qty 5

## 2018-01-16 NOTE — Progress Notes (Signed)
[facility] Telephone:([phone]) [phone]   Fax:(336) [phone]  OFFICE PROGRESS NOTE  James Beard, MD [address] [place] [address]  DIAGNOSIS: Stage IIIA (T2a, N2, M0) non-small cell lung cancer, adenocarcinoma presented with left lower lobe lung mass in addition to mediastinal lymphadenopathy diagnosed in June 2018. The patient also has suspicious soft tissue density in the pancreatic head but this has been present for 3 years on previous CT scan and unlikely to be related to [name], last dose was given 07/03/2017.  CURRENT THERAPY: Consolidation treatment with immunotherapy with Imfinzi (Durvalumab) 10 MG/KG every 2 weeks, first dose 08/15/2017.  Status post 11 cycles.  INTERVAL HISTORY: James Zamora 62 y.o. male returns to the clinic today for follow-up visit accompanied by his sister.  The patient is feeling fine today with no specific complaints except for the baseline shortness of breath and he is currently on home oxygen.  He denied having any chest pain but continues to have mild cough with no hemoptysis.  Has no recent weight loss or night sweats.  He has no nausea, vomiting, diarrhea or constipation.  He continues to tolerate his treatment with Imfinzi (Durvalumab) fairly well.  The patient is here today for evaluation before starting cycle #12.   MEDICAL HISTORY: Past Medical History:  Diagnosis Date  . Adenocarcinoma of left lung, stage 3 ([place]) 05/10/2017  . Aortic atherosclerosis ([place]) 07/31/2017  . Asthma   . Chronic diastolic CHF (congestive heart failure) ([place])   . Chronic fatigue 08/02/2017  . Lung mass   . Non-small cell lung cancer ([place]) 11/13/2015  . Obesity   . Perforated bowel ([place])     ALLERGIES:  is allergic to bee venom and penicillins.  MEDICATIONS:  Current Outpatient  Medications  Medication Sig Dispense Refill  . aspirin EC 81 MG EC tablet Take 1 tablet (81 mg total) by mouth daily. 30 tablet 1  . fluticasone furoate-vilanterol (BREO ELLIPTA) 200-25 MCG/INH AEPB Inhale 1 puff into the lungs daily. (Patient not taking: Reported on 11/07/2017) 1 each 5  . furosemide (LASIX) 40 MG tablet Take 1 tablet (40 mg total) by mouth daily. (Patient taking differently: Take 20 mg by mouth daily. ) 90 tablet 3  . lidocaine-prilocaine (EMLA) cream Apply 1 application topically as needed. Apply small amount to port site 1-1.5 hours before treatment. Cover with plastic wrap. 30 g 0  . OXYGEN Inhale 3 L into the lungs continuous.    . potassium chloride SA (KLOR-CON M20) 20 MEQ tablet Take 1 tablet (20 mEq total) by mouth daily. 30 tablet 0  . rivaroxaban (XARELTO) 20 MG TABS tablet Take 1 tablet (20 mg total) by mouth daily with supper. Begin 20 mg daily after starter pack is complete. 30 tablet 1  . Rivaroxaban 15 & 20 MG TBPK Take as directed on package: Start with one 15mg  tablet by mouth twice a day with food. On Day 22, switch to one 20mg  tablet once a day with food. 51 each 0   No current facility-administered medications for this visit.     SURGICAL HISTORY:  Past Surgical History:  Procedure Laterality Date  . CHOLECYSTECTOMY    . COLONOSCOPY N/A 07/31/2016   Procedure: COLONOSCOPY;  Surgeon: Danie Binder, MD;  [facility];  Service: Endoscopy;  Laterality: N/A;  1:45 pm  . ESOPHAGOGASTRODUODENOSCOPY N/A 07/31/2016   Procedure: ESOPHAGOGASTRODUODENOSCOPY (EGD);  Surgeon: Danie Binder, MD;  [facility];  Service: Endoscopy;  Laterality: N/A;  . EUS N/A 06/07/2017   Procedure: UPPER ENDOSCOPIC ULTRASOUND (EUS) LINEAR;  Surgeon: Milus Banister, MD;  [facility];  Service: Endoscopy;  Laterality: N/A;  . HERNIA REPAIR    . IR FLUORO GUIDE PORT INSERTION RIGHT  07/11/2017  . IR US GUIDE VASC ACCESS RIGHT  07/11/2017    REVIEW OF  SYSTEMS:  A comprehensive review of systems was negative except for: Respiratory: positive for dyspnea on exertion   PHYSICAL EXAMINATION: General appearance: alert, cooperative and no distress Head: Normocephalic, without obvious abnormality, atraumatic Neck: no adenopathy, no JVD, supple, symmetrical, trachea midline and thyroid not enlarged, symmetric, no tenderness/mass/nodules Lymph nodes: Cervical, supraclavicular, and axillary nodes normal. Resp: clear to auscultation bilaterally Back: symmetric, no curvature. ROM normal. No CVA tenderness. Cardio: regular rate and rhythm, S1, S2 normal, no murmur, click, rub or gallop GI: soft, non-tender; bowel sounds normal; no masses,  no organomegaly Extremities: edema 1+  ECOG PERFORMANCE STATUS: 1 - Symptomatic but completely ambulatory  Blood pressure 134/67, pulse (!) 119, temperature 98.9 F (37.2 C), temperature source Oral, resp. rate (!) 24, height 5\' 6"  (1.676 m), weight 195 lb 12.8 oz (88.8 kg), SpO2 96 %.  LABORATORY DATA: Lab Results  Component Value Date   WBC 6.2 01/16/2018   HGB 10.7 (L) 01/16/2018   HCT 34.1 (L) 01/16/2018   MCV 76.2 (L) 01/16/2018   PLT 301 01/16/2018      Chemistry      Component Value Date/Time   NA 138 01/02/2018 1334   NA 139 07/16/2017 1313   K 4.3 01/02/2018 1334   K 3.8 07/16/2017 1313   CL 101 01/02/2018 1334   CO2 28 01/02/2018 1334   CO2 27 07/16/2017 1313   BUN 12 01/02/2018 1334   BUN 9.8 07/16/2017 1313   CREATININE 1.08 01/02/2018 1334   CREATININE 0.9 07/16/2017 1313      Component Value Date/Time   CALCIUM 9.5 01/02/2018 1334   CALCIUM 8.6 07/16/2017 1313   ALKPHOS 82 01/02/2018 1334   ALKPHOS 57 07/16/2017 1313   AST 8 01/02/2018 1334   AST 9 07/16/2017 1313   ALT <6 01/02/2018 1334   ALT 6 07/16/2017 1313   BILITOT 0.3 01/02/2018 1334   BILITOT 0.23 07/16/2017 1313       RADIOGRAPHIC STUDIES: No results found.  ASSESSMENT AND PLAN:  This is a very pleasant 62  years old African-American male recently diagnosed with a stage IIIa non-small cell lung cancer, adenocarcinoma presented with left lower lobe lung mass in addition to mediastinal lymphadenopathy.  The patient underwent a course of concurrent chemoradiation with weekly carboplatin and paclitaxel status post 7 cycles and tolerated his treatment fairly well. He is currently undergoing treatment with consolidation immunotherapy with Imfinzi (Durvalumab) status post 11 cycles.   He tolerated the last cycle of his treatment fairly well. I recommended for him to proceed with cycle #12 today as a scheduled. I will see the patient back for follow-up visit in 2 weeks for evaluation after repeating CT scan of the chest for restaging of his disease. The patient was advised to call immediately if he has any concerning symptoms in the interval. The patient voices understanding of current disease status and treatment options and is in agreement with the current care plan. All questions  were answered. The patient knows to call the clinic with any problems, questions or concerns. We can certainly see the patient much sooner if necessary.  Disclaimer: This note was dictated with voice recognition software. Similar sounding words can inadvertently be transcribed and may not be corrected upon review.

## 2018-01-28 ENCOUNTER — Ambulatory Visit (HOSPITAL_COMMUNITY)
Admission: RE | Admit: 2018-01-28 | Discharge: 2018-01-28 | Disposition: A | Discharge status: Home / Self Care | Payer: Medicare Other | Source: Ambulatory Visit | Attending: Internal Medicine | Admitting: Internal Medicine

## 2018-01-28 DIAGNOSIS — I7789 Other specified disorders of arteries and arterioles: Secondary | ICD-10-CM | POA: Diagnosis not present

## 2018-01-28 DIAGNOSIS — Z923 Personal history of irradiation: Secondary | ICD-10-CM | POA: Insufficient documentation

## 2018-01-28 DIAGNOSIS — J90 Pleural effusion, not elsewhere classified: Secondary | ICD-10-CM | POA: Insufficient documentation

## 2018-01-28 DIAGNOSIS — C3490 Malignant neoplasm of unspecified part of unspecified bronchus or lung: Secondary | ICD-10-CM | POA: Diagnosis not present

## 2018-01-28 DIAGNOSIS — J432 Centrilobular emphysema: Secondary | ICD-10-CM | POA: Diagnosis not present

## 2018-01-28 DIAGNOSIS — C3432 Malignant neoplasm of lower lobe, left bronchus or lung: Secondary | ICD-10-CM | POA: Diagnosis not present

## 2018-01-28 MED ORDER — IOPAMIDOL (ISOVUE-300) INJECTION 61%
INTRAVENOUS | Status: AC
  Filled 2018-01-28: qty 75

## 2018-01-28 MED ORDER — IOPAMIDOL (ISOVUE-300) INJECTION 61%
75.0000 mL | Freq: Once | INTRAVENOUS | Status: AC | PRN
  Administered 2018-01-28: 75 mL via INTRAVENOUS

## 2018-01-30 ENCOUNTER — Inpatient Hospital Stay (HOSPITAL_BASED_OUTPATIENT_CLINIC_OR_DEPARTMENT_OTHER): Payer: Medicare Other | Admitting: Internal Medicine

## 2018-01-30 ENCOUNTER — Inpatient Hospital Stay: Payer: Medicare Other

## 2018-01-30 ENCOUNTER — Telehealth: Payer: Self-pay | Admitting: Oncology

## 2018-01-30 ENCOUNTER — Inpatient Hospital Stay: Payer: Medicare Other | Attending: Internal Medicine

## 2018-01-30 ENCOUNTER — Encounter: Payer: Self-pay | Admitting: Internal Medicine

## 2018-01-30 VITALS — BP 105/62 | HR 102 | Temp 97.7°F | Resp 16 | Ht 66.0 in | Wt 199.6 lb

## 2018-01-30 DIAGNOSIS — I509 Heart failure, unspecified: Secondary | ICD-10-CM | POA: Insufficient documentation

## 2018-01-30 DIAGNOSIS — Z9981 Dependence on supplemental oxygen: Secondary | ICD-10-CM | POA: Insufficient documentation

## 2018-01-30 DIAGNOSIS — Z79899 Other long term (current) drug therapy: Secondary | ICD-10-CM | POA: Insufficient documentation

## 2018-01-30 DIAGNOSIS — R05 Cough: Secondary | ICD-10-CM

## 2018-01-30 DIAGNOSIS — Z5112 Encounter for antineoplastic immunotherapy: Secondary | ICD-10-CM

## 2018-01-30 DIAGNOSIS — C3492 Malignant neoplasm of unspecified part of left bronchus or lung: Secondary | ICD-10-CM

## 2018-01-30 DIAGNOSIS — R0602 Shortness of breath: Secondary | ICD-10-CM | POA: Insufficient documentation

## 2018-01-30 DIAGNOSIS — C3432 Malignant neoplasm of lower lobe, left bronchus or lung: Secondary | ICD-10-CM | POA: Insufficient documentation

## 2018-01-30 DIAGNOSIS — R5382 Chronic fatigue, unspecified: Secondary | ICD-10-CM

## 2018-01-30 DIAGNOSIS — I5032 Chronic diastolic (congestive) heart failure: Secondary | ICD-10-CM | POA: Diagnosis not present

## 2018-01-30 LAB — COMPREHENSIVE METABOLIC PANEL
ALBUMIN: 3.2 g/dL — AB (ref 3.5–5.0)
ALT: 8 U/L (ref 0–55)
ANION GAP: 9 (ref 3–11)
AST: 11 U/L (ref 5–34)
Alkaline Phosphatase: 99 U/L (ref 40–150)
BUN: 9 mg/dL (ref 7–26)
CHLORIDE: 100 mmol/L (ref 98–109)
CO2: 28 mmol/L (ref 22–29)
Calcium: 9.4 mg/dL (ref 8.4–10.4)
Creatinine, Ser: 1.12 mg/dL (ref 0.70–1.30)
GFR calc Af Amer: >60 mL/min (ref >60)
GFR calc non Af Amer: >60 mL/min (ref >60)
GLUCOSE: 97 mg/dL (ref 70–140)
POTASSIUM: 4.2 mmol/L (ref 3.5–5.1)
SODIUM: 137 mmol/L (ref 136–145)
TOTAL PROTEIN: 8.2 g/dL (ref 6.4–8.3)
Total Bilirubin: 0.4 mg/dL (ref 0.2–1.2)

## 2018-01-30 LAB — CBC WITH DIFFERENTIAL/PLATELET
BASOS ABS: 0 10*3/uL (ref 0.0–0.1)
Basophils Relative: 0 %
Eosinophils Absolute: 0.3 10*3/uL (ref 0.0–0.5)
Eosinophils Relative: 5 %
HEMATOCRIT: 35.3 % — AB (ref 38.4–49.9)
Hemoglobin: 10.8 g/dL — ABNORMAL LOW (ref 13.0–17.1)
Lymphocytes Relative: 14 %
Lymphs Abs: 0.9 10*3/uL (ref 0.9–3.3)
MCH: 24.3 pg — ABNORMAL LOW (ref 27.2–33.4)
MCHC: 30.6 g/dL — ABNORMAL LOW (ref 32.0–36.0)
MCV: 79.3 fL (ref 79.3–98.0)
MONO ABS: 0.5 10*3/uL (ref 0.1–0.9)
MONOS PCT: 9 %
NEUTROS ABS: 4.5 10*3/uL (ref 1.5–6.5)
Neutrophils Relative %: 72 %
Platelets: 223 10*3/uL (ref 140–400)
RBC: 4.45 MIL/uL (ref 4.20–5.82)
RDW: 17.9 % — AB (ref 11.0–14.6)
WBC: 6.2 10*3/uL (ref 4.0–10.3)

## 2018-01-30 LAB — TSH: TSH: 0.815 u[IU]/mL (ref 0.320–4.118)

## 2018-01-30 MED ORDER — SODIUM CHLORIDE 0.9% FLUSH
10.0000 mL | INTRAVENOUS | Status: DC | PRN
Start: 2018-01-30 — End: 2018-01-30
  Administered 2018-01-30: 10 mL
  Filled 2018-01-30: qty 10

## 2018-01-30 MED ORDER — SODIUM CHLORIDE 0.9 % IV SOLN
Freq: Once | INTRAVENOUS | Status: AC
  Administered 2018-01-30: 11:00:00 via INTRAVENOUS

## 2018-01-30 MED ORDER — HEPARIN SOD (PORK) LOCK FLUSH 100 UNIT/ML IV SOLN
500.0000 [IU] | Freq: Once | INTRAVENOUS | Status: AC | PRN
  Administered 2018-01-30: 500 [IU]
  Filled 2018-01-30: qty 5

## 2018-01-30 MED ORDER — DURVALUMAB 500 MG/10ML IV SOLN
9.7000 mg/kg | Freq: Once | INTRAVENOUS | Status: AC
  Administered 2018-01-30: 860 mg via INTRAVENOUS
  Filled 2018-01-30: qty 7.2

## 2018-01-30 NOTE — Patient Instructions (Signed)
[facility] Discharge Instructions for Patients Receiving Chemotherapy  Today you received the following chemotherapy agents:  Durvalumab (Imfinzi)  To help prevent nausea and vomiting after your treatment, we encourage you to take your nausea medication as prescribed.   If you develop nausea and vomiting that is not controlled by your nausea medication, call the clinic.   BELOW ARE SYMPTOMS THAT SHOULD BE REPORTED IMMEDIATELY:  *FEVER GREATER THAN 100.5 F  *CHILLS WITH OR WITHOUT FEVER  NAUSEA AND VOMITING THAT IS NOT CONTROLLED WITH YOUR NAUSEA MEDICATION  *UNUSUAL SHORTNESS OF BREATH  *UNUSUAL BRUISING OR BLEEDING  TENDERNESS IN MOUTH AND THROAT WITH OR WITHOUT PRESENCE OF ULCERS  *URINARY PROBLEMS  *BOWEL PROBLEMS  UNUSUAL RASH Items with * indicate a potential emergency and should be followed up as soon as possible.  Feel free to call the clinic should you have any questions or concerns. The clinic phone number is [phone].  Please show the CHEMO ALERT CARD at check-in to the Emergency Department and triage nurse.   

## 2018-01-30 NOTE — Telephone Encounter (Signed)
Scheduled appt per 4/3 los - patient to get an updated schedule next visit

## 2018-01-30 NOTE — Progress Notes (Signed)
James Zamora [phone]   Fax:(336) [phone]  OFFICE PROGRESS NOTE  James Beard, MD [address] [place]  [address]  DIAGNOSIS: Stage IIIA (T2a, N2, M0) non-small cell lung cancer, adenocarcinoma presented with left lower lobe lung mass in addition to mediastinal lymphadenopathy diagnosed in June 2018. The patient also has suspicious soft tissue density in the pancreatic head but this has been present for 3 years on previous CT scan and unlikely to be related to [name], last dose was given 07/03/2017.  CURRENT THERAPY: Consolidation treatment with immunotherapy with Imfinzi (Durvalumab) 10 MG/KG every 2 weeks, first dose 08/15/2017.  Status post 12 cycles.  INTERVAL HISTORY: James Zamora 62 y.o. male returns to the clinic today for follow-up visit accompanied by his sister.  The patient is feeling fine today with no specific complaints except for dry cough.  He is using Mucinex with no improvement.  He denied having any chest pain but continues to have the baseline shortness of breath increased with exertion and he is current on home oxygen.  He denied having any history of hemoptysis.  He has no significant weight loss or night sweats.  He has no nausea, vomiting, diarrhea or constipation.  He continues to tolerate his treatment with immunotherapy fairly well.  The patient had repeat CT scan of the chest performed recently and he is here for evaluation and discussion of his discuss results.   MEDICAL HISTORY: Past Medical History:  Diagnosis Date  . Adenocarcinoma of left lung, stage 3 ([place]) 05/10/2017  . Aortic atherosclerosis ([place]) 07/31/2017  . Asthma   . Chronic diastolic CHF (congestive heart failure) ([place])   . Chronic fatigue 08/02/2017  . Lung mass   . Non-small cell lung cancer  ([place]) 11/13/2015  . Obesity   . Perforated bowel ([place])     ALLERGIES:  is allergic to bee venom and penicillins.  MEDICATIONS:  Current Outpatient Medications  Medication Sig Dispense Refill  . aspirin EC 81 MG EC tablet Take 1 tablet (81 mg total) by mouth daily. 30 tablet 1  . fluticasone furoate-vilanterol (BREO ELLIPTA) 200-25 MCG/INH AEPB Inhale 1 puff into the lungs daily. 1 each 5  . furosemide (LASIX) 40 MG tablet Take 1 tablet (40 mg total) by mouth daily. (Patient taking differently: Take 20 mg by mouth daily. ) 90 tablet 3  . lidocaine-prilocaine (EMLA) cream Apply 1 application topically as needed. Apply small amount to port site 1-1.5 hours before treatment. Cover with plastic wrap. 30 g 0  . OXYGEN Inhale 3 L into the lungs continuous.    . potassium chloride SA (KLOR-CON M20) 20 MEQ tablet Take 1 tablet (20 mEq total) by mouth daily. 30 tablet 0  . rivaroxaban (XARELTO) 20 MG TABS tablet Take 1 tablet (20 mg total) by mouth daily with supper. Begin 20 mg daily after starter pack is complete. 30 tablet 1  . Rivaroxaban 15 & 20 MG TBPK Take as directed on package: Start with one 15mg  tablet by mouth twice a day with food. On Day 22, switch to one 20mg  tablet once a day with food. 51 each 0   No current facility-administered medications for this visit.     SURGICAL HISTORY:  Past Surgical History:  Procedure Laterality Date  . CHOLECYSTECTOMY    .  COLONOSCOPY N/A 07/31/2016   Procedure: COLONOSCOPY;  Surgeon: Danie Binder, MD;  [facility];  Service: Endoscopy;  Laterality: N/A;  1:45 pm  . ESOPHAGOGASTRODUODENOSCOPY N/A 07/31/2016   Procedure: ESOPHAGOGASTRODUODENOSCOPY (EGD);  Surgeon: Danie Binder, MD;  [facility];  Service: Endoscopy;  Laterality: N/A;  . EUS N/A 06/07/2017   Procedure: UPPER ENDOSCOPIC ULTRASOUND (EUS) LINEAR;  Surgeon: Milus Banister, MD;  [facility];  Service: Endoscopy;  Laterality: N/A;  . HERNIA REPAIR    .  IR FLUORO GUIDE PORT INSERTION RIGHT  07/11/2017  . IR US GUIDE VASC ACCESS RIGHT  07/11/2017    REVIEW OF SYSTEMS:  Constitutional: positive for fatigue Eyes: negative Ears, nose, mouth, throat, and face: negative Respiratory: positive for cough Cardiovascular: negative Gastrointestinal: negative Genitourinary:negative Integument/breast: negative Hematologic/lymphatic: negative Musculoskeletal:negative Neurological: negative Behavioral/Psych: negative Endocrine: negative Allergic/Immunologic: negative   PHYSICAL EXAMINATION: General appearance: alert, cooperative and no distress Head: Normocephalic, without obvious abnormality, atraumatic Neck: no adenopathy, no JVD, supple, symmetrical, trachea midline and thyroid not enlarged, symmetric, no tenderness/mass/nodules Lymph nodes: Cervical, supraclavicular, and axillary nodes normal. Resp: clear to auscultation bilaterally Back: symmetric, no curvature. ROM normal. No CVA tenderness. Cardio: regular rate and rhythm, S1, S2 normal, no murmur, click, rub or gallop GI: soft, non-tender; bowel sounds normal; no masses,  no organomegaly Extremities: edema 2+ Neurologic: Alert and oriented X 3, normal strength and tone. Normal symmetric reflexes. Normal coordination and gait  ECOG PERFORMANCE STATUS: 1 - Symptomatic but completely ambulatory  Blood pressure 105/62, pulse (!) 102, temperature 97.7 F (36.5 C), temperature source Oral, resp. rate 16, height 5\' 6"  (1.676 m), weight 199 lb 9.6 oz (90.5 kg), SpO2 90 %.  LABORATORY DATA: Lab Results  Component Value Date   WBC 6.2 01/30/2018   HGB 10.8 (L) 01/30/2018   HCT 35.3 (L) 01/30/2018   MCV 79.3 01/30/2018   PLT 223 01/30/2018      Chemistry      Component Value Date/Time   NA 134 (L) 01/16/2018 0910   NA 139 2020-08-517 1313   K 3.8 01/16/2018 0910   K 3.8 2020-08-517 1313   CL 99 01/16/2018 0910   CO2 27 01/16/2018 0910   CO2 27 2020-08-517 1313   BUN 9 01/16/2018 0910     BUN 9.8 2020-08-517 1313   CREATININE 1.08 01/16/2018 0910   CREATININE 0.9 2020-08-517 1313      Component Value Date/Time   CALCIUM 9.4 01/16/2018 0910   CALCIUM 8.6 2020-08-517 1313   ALKPHOS 84 01/16/2018 0910   ALKPHOS 57 2020-08-517 1313   AST 11 01/16/2018 0910   AST 9 2020-08-517 1313   ALT 7 01/16/2018 0910   ALT 6 2020-08-517 1313   BILITOT 0.3 01/16/2018 0910   BILITOT 0.23 2020-08-517 1313       RADIOGRAPHIC STUDIES: Ct Chest W Contrast  Result Date: 01/28/2018 CLINICAL DATA:  Stage IIIA left lower lobe adenocarcinoma diagnosed in 2018 post chemo radiation therapy. Immunotherapy ongoing. EXAM: CT CHEST WITH CONTRAST TECHNIQUE: Multidetector CT imaging of the chest was performed during intravenous contrast administration. CONTRAST:  49mL ISOVUE-300 IOPAMIDOL (ISOVUE-300) INJECTION 61% COMPARISON:  CT 11/06/2017 and 07/30/2017. FINDINGS: Cardiovascular: No acute vascular findings are evident on noncontrast imaging. There is stable atherosclerosis of the aortic arch. There is central enlargement of the pulmonary arteries consistent with pulmonary arterial hypertension. Right IJ Port-A-Cath tip is in the superior aspect of the right atrium. The heart size is normal. There is  no pericardial effusion. Mediastinum/Nodes: Mediastinal lymph nodes appear stable, including and 9 mm AP window node on image 42/2 and a left paratracheal node measuring 12 mm on image 44/2.No progressive adenopathy. There is a stable small left thyroid nodule. The trachea and esophagus appear unremarkable. Lungs/Pleura: The left pleural effusion has decreased in volume. There may be a small loculated component posteriorly in the lower left hemithorax. There is overall improved aeration of the left lower lobe. The previously demonstrated left lower lobe mass is largely obscured by presumed radiation changes. There is no progression to suggest local recurrence. There are no new or enlarging pulmonary nodules. Moderate  centrilobular emphysema noted. Upper abdomen: No suspicious findings within the visualized upper abdomen. Known left adrenal nodule is incompletely visualized. Musculoskeletal/Chest wall: There is no chest wall mass or suspicious osseous finding. IMPRESSION: 1. Evolving radiation changes posteromedially in the left hemithorax, largely obscuring the previously demonstrated left lower lobe mass. The overall left lower lobe aeration has improved, and the left pleural effusion has decreased in volume. 2. Stable mildly prominent mediastinal lymph nodes. No evidence of disease progression. 3. Central enlargement of the pulmonary arteries consistent with pulmonary arterial hypertension. No recurrent pulmonary embolism identified on routine imaging. 4.  Emphysema (ICD10-J43.9). Electronically Signed   By: Richardean Sale M.D.   On: 01/28/2018 15:58    ASSESSMENT AND PLAN:  This is a very pleasant 62 years old African-American male recently diagnosed with a stage IIIA non-small cell lung cancer, adenocarcinoma presented with left lower lobe lung mass in addition to mediastinal lymphadenopathy.  The patient underwent a course of concurrent chemoradiation with weekly carboplatin and paclitaxel status post 7 cycles and tolerated his treatment fairly well. He is currently undergoing treatment with consolidation immunotherapy with Imfinzi (Durvalumab) status post 12 cycles.   The patient continues to tolerate this treatment well with no concerning complaints. He had repeat CT scan of the chest performed recently.  I personally and independently reviewed the scans and discussed the results with the patient and his sister.  His scan showed no concerning findings for disease progression. I recommended for the patient to continue his treatment with consolidation immunotherapy and he will proceed with cycle #13 today. For the dry cough, I recommended for the patient to try Delsym over-the-counter. He will come back for  follow-up visit in 2 weeks for evaluation before starting cycle #14. The patient was advised to call immediately if he has any concerning symptoms in the interval. The patient voices understanding of current disease status and treatment options and is in agreement with the current care plan. All questions were answered. The patient knows to call the clinic with any problems, questions or concerns. We can certainly see the patient much sooner if necessary.  Disclaimer: This note was dictated with voice recognition software. Similar sounding words can inadvertently be transcribed and may not be corrected upon review.

## 2018-02-07 ENCOUNTER — Encounter: Payer: Self-pay | Admitting: Internal Medicine

## 2018-02-07 ENCOUNTER — Ambulatory Visit (INDEPENDENT_AMBULATORY_CARE_PROVIDER_SITE_OTHER): Payer: Medicare Other | Admitting: Internal Medicine

## 2018-02-07 VITALS — BP 104/60 | HR 106 | Ht 66.0 in | Wt 202.0 lb

## 2018-02-07 DIAGNOSIS — J449 Chronic obstructive pulmonary disease, unspecified: Secondary | ICD-10-CM | POA: Diagnosis not present

## 2018-02-07 DIAGNOSIS — I5033 Acute on chronic diastolic (congestive) heart failure: Secondary | ICD-10-CM | POA: Diagnosis not present

## 2018-02-07 DIAGNOSIS — R0602 Shortness of breath: Secondary | ICD-10-CM

## 2018-02-07 MED ORDER — FUROSEMIDE 40 MG PO TABS: 60.0000 mg | ORAL_TABLET | Freq: Every day | ORAL | 3 refills | Status: DC

## 2018-02-07 MED ORDER — POTASSIUM CHLORIDE CRYS ER 20 MEQ PO TBCR: 40.0000 meq | EXTENDED_RELEASE_TABLET | Freq: Every day | ORAL | 3 refills | Status: DC

## 2018-02-07 NOTE — Progress Notes (Signed)
Cardiology Office Note   Date:  02/07/2018   ID:  DOY TAAFFE, DOB Jun 08, 1956, MRN [id number]  PCP:  [name], MD  Cardiologist:   [name], MD   James Zamora presents for f/u of disatolic CHF     History of Present Illness: James Zamora is a 62 y.o. male with a history of dyspnea, HTN COPD, diastoli CHF and   sleep apnea  I saw him in clinic in June  2018 Since then he has been diagnosed with Stage IIIA nonsmall cell lung CA  He also suffered a PE    The patient says his breathing is fair   Wife says it is bad   James Zamora doesn't do much   Denies CP Appetite down   Significant LE edeam    Sits all day with legs down.   Says he is watching salt intake    Current Meds  Medication Sig  . aspirin EC 81 MG EC tablet Take 1 tablet (81 mg total) by mouth daily.  . fluticasone furoate-vilanterol (BREO ELLIPTA) 200-25 MCG/INH AEPB Inhale 1 puff into the lungs daily.  . furosemide (LASIX) 40 MG tablet Take 1 tablet (40 mg total) by mouth daily. (Patient taking differently: Take 20 mg by mouth daily. )  . lidocaine-prilocaine (EMLA) cream Apply 1 application topically as needed. Apply small amount to port site 1-1.5 hours before treatment. Cover with plastic wrap.  . OXYGEN Inhale 3 L into the lungs continuous.  . potassium chloride SA (KLOR-CON M20) 20 MEQ tablet Take 1 tablet (20 mEq total) by mouth daily.  . rivaroxaban (XARELTO) 20 MG TABS tablet Take 1 tablet (20 mg total) by mouth daily with supper. Begin 20 mg daily after starter pack is complete.  . Rivaroxaban 15 & 20 MG TBPK Take as directed on package: Start with one 15mg  tablet by mouth twice a day with food. On Day 22, switch to one 20mg  tablet once a day with food.     Allergies:   Bee venom and Penicillins   Past Medical History:  Diagnosis Date  . Adenocarcinoma of left lung, stage 3 ([place]) 05/10/2017  . Aortic atherosclerosis ([place]) 07/31/2017  . Asthma   . Chronic diastolic CHF (congestive heart failure) ([place])   .  Chronic fatigue 08/02/2017  . Lung mass   . Non-small cell lung cancer ([place]) 11/13/2015  . Obesity   . Perforated bowel [facility])     Past Surgical History:  Procedure Laterality Date  . CHOLECYSTECTOMY    . COLONOSCOPY N/A 07/31/2016   Procedure: COLONOSCOPY;  Surgeon: Danie Binder, MD;  [facility];  Service: Endoscopy;  Laterality: N/A;  1:45 pm  . ESOPHAGOGASTRODUODENOSCOPY N/A 07/31/2016   Procedure: ESOPHAGOGASTRODUODENOSCOPY (EGD);  Surgeon: Danie Binder, MD;  [facility];  Service: Endoscopy;  Laterality: N/A;  . EUS N/A 06/07/2017   Procedure: UPPER ENDOSCOPIC ULTRASOUND (EUS) LINEAR;  Surgeon: Milus Banister, MD;  [facility];  Service: Endoscopy;  Laterality: N/A;  . HERNIA REPAIR    . IR FLUORO GUIDE PORT INSERTION RIGHT  07/11/2017  . IR US GUIDE VASC ACCESS RIGHT  07/11/2017     Social History:  The patient  reports that he quit smoking about 5 years ago. His smoking use included cigarettes. He has a 20.00 pack-year smoking history. He quit smokeless tobacco use about 3 years ago. His smokeless tobacco use included chew. He reports that he does not drink alcohol or use drugs.  Family History:  The patient's family history includes Asthma in his father; Diabetes in his mother; Hypertension in his father and mother; Prostate cancer in his father.    ROS:  Please see the history of present illness. All other systems are reviewed and  Negative to the above problem except as noted.    PHYSICAL EXAM: VS:  BP 104/60   Pulse (!) 106   Ht 5\' 6"  (1.676 m)   Wt 202 lb (91.6 kg)   SpO2 91%   BMI 32.60 kg/m   GEN: James Zamora is quiet 62 yo , in no acute distress  HEENT: normal  Neck: no JVD, carotid bruits, or masses Cardiac: RRR; no murmurs, rubs, or gallops, 2+ LE edema     Respiratory:  Rhonchi bilater   Worse at basess (L greater than R) GI: soft, nontender, nondistended, + BS  No hepatomegaly  MS: no deformity Moving all extremities   Skin:  warm and dry, no rash Neuro:  Strength and sensation are intact Psych: euthymic mood, full affect   EKG:  EKG is not ordered today.   Lipid Panel No results found for: CHOL, TRIG, HDL, CHOLHDL, VLDL, LDLCALC, LDLDIRECT    Wt Readings from Last 3 Encounters:  02/07/18 202 lb (91.6 kg)  01/30/18 199 lb 9.6 oz (90.5 kg)  01/16/18 195 lb 12.8 oz (88.8 kg)      ASSESSMENT AND PLAN:  1  Acute on chronic diaslic CHF    Volume is up on exm    Will get labs to decide on diuretic changes    Would be good if he could elevate legs  Rx for lift chair given    2  Dyspnea  James Zamora with signif COPD and lung CA   He is s/p PE  Does have Volume increase on exam   Will review labs prior t making changes      Current medicines are reviewed at length with the patient today.  The patient does not have concerns regarding medicines.  Signed, [name], MD  02/07/2018 8:37 AM    [address], [place], [place]  [address] Phone: [phone]; Fax: [phone]   53

## 2018-02-07 NOTE — Patient Instructions (Addendum)
Medication Instructions:  Your physician has recommended you make the following change in your medication:  Increase Lasix to 60 mg Daily  Increase Potassium to 40 mg Daily    Labwork: Your physician recommends that you return for lab work in: 1 Week    Testing/Procedures: NONE   Follow-Up: Your physician recommends that you schedule a follow-up appointment with Bernerd Pho, PA-C    Any Other Special Instructions Will Be Listed Below (If Applicable). You have been given a Rx for lab work.  You have been given a Rx for a lift chair.  Please expect a call from [facility]     If you need a refill on your cardiac medications before your next appointment, please call your pharmacy.

## 2018-02-09 DIAGNOSIS — I110 Hypertensive heart disease with heart failure: Secondary | ICD-10-CM | POA: Diagnosis not present

## 2018-02-09 DIAGNOSIS — M6281 Muscle weakness (generalized): Secondary | ICD-10-CM | POA: Diagnosis not present

## 2018-02-09 DIAGNOSIS — Z87891 Personal history of nicotine dependence: Secondary | ICD-10-CM | POA: Diagnosis not present

## 2018-02-09 DIAGNOSIS — I5033 Acute on chronic diastolic (congestive) heart failure: Secondary | ICD-10-CM | POA: Diagnosis not present

## 2018-02-09 DIAGNOSIS — C3492 Malignant neoplasm of unspecified part of left bronchus or lung: Secondary | ICD-10-CM | POA: Diagnosis not present

## 2018-02-09 DIAGNOSIS — J449 Chronic obstructive pulmonary disease, unspecified: Secondary | ICD-10-CM | POA: Diagnosis not present

## 2018-02-11 ENCOUNTER — Telehealth: Payer: Self-pay | Admitting: Internal Medicine

## 2018-02-11 DIAGNOSIS — J449 Chronic obstructive pulmonary disease, unspecified: Secondary | ICD-10-CM | POA: Diagnosis not present

## 2018-02-11 DIAGNOSIS — I110 Hypertensive heart disease with heart failure: Secondary | ICD-10-CM | POA: Diagnosis not present

## 2018-02-11 DIAGNOSIS — C3492 Malignant neoplasm of unspecified part of left bronchus or lung: Secondary | ICD-10-CM | POA: Diagnosis not present

## 2018-02-11 DIAGNOSIS — M6281 Muscle weakness (generalized): Secondary | ICD-10-CM | POA: Diagnosis not present

## 2018-02-11 DIAGNOSIS — Z87891 Personal history of nicotine dependence: Secondary | ICD-10-CM | POA: Diagnosis not present

## 2018-02-11 DIAGNOSIS — I5033 Acute on chronic diastolic (congestive) heart failure: Secondary | ICD-10-CM | POA: Diagnosis not present

## 2018-02-11 NOTE — Telephone Encounter (Signed)
Patient was seen in [place] office.

## 2018-02-11 NOTE — Telephone Encounter (Signed)
New Message  Will challa calling to request occupational therapy for the pt 1 time a week for 3 weeks. Please call

## 2018-02-12 DIAGNOSIS — M6281 Muscle weakness (generalized): Secondary | ICD-10-CM | POA: Diagnosis not present

## 2018-02-12 DIAGNOSIS — J449 Chronic obstructive pulmonary disease, unspecified: Secondary | ICD-10-CM | POA: Diagnosis not present

## 2018-02-12 DIAGNOSIS — C3492 Malignant neoplasm of unspecified part of left bronchus or lung: Secondary | ICD-10-CM | POA: Diagnosis not present

## 2018-02-12 DIAGNOSIS — I5033 Acute on chronic diastolic (congestive) heart failure: Secondary | ICD-10-CM | POA: Diagnosis not present

## 2018-02-12 DIAGNOSIS — I110 Hypertensive heart disease with heart failure: Secondary | ICD-10-CM | POA: Diagnosis not present

## 2018-02-12 DIAGNOSIS — Z87891 Personal history of nicotine dependence: Secondary | ICD-10-CM | POA: Diagnosis not present

## 2018-02-12 NOTE — Telephone Encounter (Signed)
Verbal order given to Will Challa for OT 1 time a week for 3 weeks. Will voiced understanding

## 2018-02-12 NOTE — Telephone Encounter (Signed)
Please confirm and place order for OT as noted

## 2018-02-13 ENCOUNTER — Telehealth: Payer: Self-pay | Admitting: Oncology

## 2018-02-13 ENCOUNTER — Inpatient Hospital Stay: Payer: Medicare Other

## 2018-02-13 ENCOUNTER — Inpatient Hospital Stay (HOSPITAL_BASED_OUTPATIENT_CLINIC_OR_DEPARTMENT_OTHER): Payer: Medicare Other | Admitting: Oncology

## 2018-02-13 ENCOUNTER — Encounter: Payer: Self-pay | Admitting: Oncology

## 2018-02-13 VITALS — BP 101/58 | HR 100 | Temp 97.7°F | Resp 18 | Ht 66.0 in | Wt 202.6 lb

## 2018-02-13 DIAGNOSIS — C3432 Malignant neoplasm of lower lobe, left bronchus or lung: Secondary | ICD-10-CM

## 2018-02-13 DIAGNOSIS — Z79899 Other long term (current) drug therapy: Secondary | ICD-10-CM | POA: Diagnosis not present

## 2018-02-13 DIAGNOSIS — R0602 Shortness of breath: Secondary | ICD-10-CM | POA: Diagnosis not present

## 2018-02-13 DIAGNOSIS — I509 Heart failure, unspecified: Secondary | ICD-10-CM | POA: Diagnosis not present

## 2018-02-13 DIAGNOSIS — Z9981 Dependence on supplemental oxygen: Secondary | ICD-10-CM

## 2018-02-13 DIAGNOSIS — C3492 Malignant neoplasm of unspecified part of left bronchus or lung: Secondary | ICD-10-CM

## 2018-02-13 DIAGNOSIS — Z5112 Encounter for antineoplastic immunotherapy: Secondary | ICD-10-CM | POA: Diagnosis not present

## 2018-02-13 LAB — CBC WITH DIFFERENTIAL/PLATELET
BASOS ABS: 0 10*3/uL (ref 0.0–0.1)
Basophils Relative: 0 %
EOS PCT: 2 %
Eosinophils Absolute: 0.1 10*3/uL (ref 0.0–0.5)
HCT: 33.8 % — ABNORMAL LOW (ref 38.4–49.9)
Hemoglobin: 10.1 g/dL — ABNORMAL LOW (ref 13.0–17.1)
LYMPHS PCT: 16 %
Lymphs Abs: 0.9 10*3/uL (ref 0.9–3.3)
MCH: 24.1 pg — ABNORMAL LOW (ref 27.2–33.4)
MCHC: 29.9 g/dL — ABNORMAL LOW (ref 32.0–36.0)
MCV: 80.7 fL (ref 79.3–98.0)
MONO ABS: 0.4 10*3/uL (ref 0.1–0.9)
Monocytes Relative: 7 %
Neutro Abs: 4.2 10*3/uL (ref 1.5–6.5)
Neutrophils Relative %: 75 %
PLATELETS: 217 10*3/uL (ref 140–400)
RBC: 4.19 MIL/uL — AB (ref 4.20–5.82)
RDW: 18.3 % — AB (ref 11.0–14.6)
WBC: 5.5 10*3/uL (ref 4.0–10.3)

## 2018-02-13 LAB — COMPREHENSIVE METABOLIC PANEL
AST: 9 U/L (ref 5–34)
Albumin: 3.1 g/dL — ABNORMAL LOW (ref 3.5–5.0)
Alkaline Phosphatase: 78 U/L (ref 40–150)
Anion gap: 6 (ref 3–11)
BUN: 8 mg/dL (ref 7–26)
CHLORIDE: 101 mmol/L (ref 98–109)
CO2: 32 mmol/L — ABNORMAL HIGH (ref 22–29)
CREATININE: 0.96 mg/dL (ref 0.70–1.30)
Calcium: 9.1 mg/dL (ref 8.4–10.4)
GFR calc Af Amer: >60 mL/min (ref >60)
GFR calc non Af Amer: >60 mL/min (ref >60)
Glucose, Bld: 96 mg/dL (ref 70–140)
POTASSIUM: 3.9 mmol/L (ref 3.5–5.1)
Sodium: 139 mmol/L (ref 136–145)
Total Bilirubin: 0.5 mg/dL (ref 0.2–1.2)
Total Protein: 7.9 g/dL (ref 6.4–8.3)

## 2018-02-13 MED ORDER — SODIUM CHLORIDE 0.9% FLUSH
10.0000 mL | Freq: Once | INTRAVENOUS | Status: AC
  Administered 2018-02-13: 10 mL
  Filled 2018-02-13: qty 10

## 2018-02-13 MED ORDER — SODIUM CHLORIDE 0.9 % IV SOLN
9.8000 mg/kg | Freq: Once | INTRAVENOUS | Status: AC
  Administered 2018-02-13: 860 mg via INTRAVENOUS
  Filled 2018-02-13: qty 10

## 2018-02-13 MED ORDER — SODIUM CHLORIDE 0.9% FLUSH
10.0000 mL | INTRAVENOUS | Status: DC | PRN
  Administered 2018-02-13: 10 mL
  Filled 2018-02-13: qty 10

## 2018-02-13 MED ORDER — SODIUM CHLORIDE 0.9 % IV SOLN
Freq: Once | INTRAVENOUS | Status: AC
  Administered 2018-02-13: 13:00:00 via INTRAVENOUS

## 2018-02-13 MED ORDER — HEPARIN SOD (PORK) LOCK FLUSH 100 UNIT/ML IV SOLN
500.0000 [IU] | Freq: Once | INTRAVENOUS | Status: AC | PRN
Start: 2018-02-13 — End: 2018-02-13
  Administered 2018-02-13: 500 [IU]
  Filled 2018-02-13: qty 5

## 2018-02-13 NOTE — Assessment & Plan Note (Signed)
This is a very pleasant 62 year old African-American male recently diagnosed with a stage IIIA non-small cell lung cancer, adenocarcinoma presented with left lower lobe lung mass in addition to mediastinal lymphadenopathy.  The patient underwent a course of concurrent chemoradiation with weekly carboplatin and paclitaxel status post 7 cycles and tolerated his treatment fairly well. He is currently undergoing treatment with consolidation immunotherapy with Imfinzi (Durvalumab) status post 13 cycles.   The patient continues to tolerate this treatment well with no concerning complaints. Recommend for the patient to proceed with cycle 14 of his treatment as scheduled today.  He will come back for follow-up visit in 2 weeks for evaluation before starting cycle #15.  The patient was advised to call immediately if he has any concerning symptoms in the interval. The patient voices understanding of current disease status and treatment options and is in agreement with the current care plan. All questions were answered. The patient knows to call the clinic with any problems, questions or concerns. We can certainly see the patient much sooner if necessary.

## 2018-02-13 NOTE — Telephone Encounter (Signed)
3 cycles already scheduled per 4/17 los.

## 2018-02-13 NOTE — Patient Instructions (Signed)
[facility] Discharge Instructions for Patients Receiving Chemotherapy  Today you received the following chemotherapy agents: Imfinzi.  To help prevent nausea and vomiting after your treatment, we encourage you to take your nausea medication as directed.   If you develop nausea and vomiting that is not controlled by your nausea medication, call the clinic.   BELOW ARE SYMPTOMS THAT SHOULD BE REPORTED IMMEDIATELY:  *FEVER GREATER THAN 100.5 F  *CHILLS WITH OR WITHOUT FEVER  NAUSEA AND VOMITING THAT IS NOT CONTROLLED WITH YOUR NAUSEA MEDICATION  *UNUSUAL SHORTNESS OF BREATH  *UNUSUAL BRUISING OR BLEEDING  TENDERNESS IN MOUTH AND THROAT WITH OR WITHOUT PRESENCE OF ULCERS  *URINARY PROBLEMS  *BOWEL PROBLEMS  UNUSUAL RASH Items with * indicate a potential emergency and should be followed up as soon as possible.  Feel free to call the clinic should you have any questions or concerns. The clinic phone number is [phone].  Please show the CHEMO ALERT CARD at check-in to the Emergency Department and triage nurse.   

## 2018-02-13 NOTE — Progress Notes (Signed)
[facility] OFFICE PROGRESS NOTE  [name], MD [address] [place] [address]  DIAGNOSIS: Stage IIIA (T2a, N2, M0) non-small cell lung cancer, adenocarcinoma presented with left lower lobe lung mass in addition to mediastinal lymphadenopathy diagnosed in June 2018. The patient also has suspicious soft tissue density in the pancreatic head but this has been present for 3 years on previous CT scan and unlikely to be related to [name], last dose was given 07/03/2017.  CURRENT THERAPY: Consolidation treatment with immunotherapy with Imfinzi (Durvalumab) 10 MG/KG every 2 weeks, first dose 08/15/2017.  Status post 13 cycles.  INTERVAL HISTORY: James Zamora 62 y.o. male returns for routine follow-up visit accompanied by his sister.  Patient is feeling fine today with no specific complaints.  Patient denies fevers and chills.  Denies chest pain, cough, hemoptysis.  He has baseline shortness of breath and wears home oxygen.  Denies nausea, vomiting, constipation, diarrhea.  Denies skin rashes.  The patient continues to tolerate treatment with immunotherapy fairly well.  The patient is here for evaluation prior to cycle 14 of his treatment.  MEDICAL HISTORY: Past Medical History:  Diagnosis Date  . Adenocarcinoma of left lung, stage 3 ([place]) 05/10/2017  . Aortic atherosclerosis ([place]) 07/31/2017  . Asthma   . Chronic diastolic CHF (congestive heart failure) ([place])   . Chronic fatigue 08/02/2017  . Lung mass   . Non-small cell lung cancer ([place]) 11/13/2015  . Obesity   . Perforated bowel ([place])     ALLERGIES:  is allergic to bee venom and penicillins.  MEDICATIONS:  Current Outpatient Medications  Medication Sig Dispense Refill  . aspirin EC 81 MG EC tablet Take 1 tablet (81 mg total) by mouth daily. 30 tablet  1  . fluticasone furoate-vilanterol (BREO ELLIPTA) 200-25 MCG/INH AEPB Inhale 1 puff into the lungs daily. 1 each 5  . furosemide (LASIX) 40 MG tablet Take 1.5 tablets (60 mg total) by mouth daily. 135 tablet 3  . lidocaine-prilocaine (EMLA) cream Apply 1 application topically as needed. Apply small amount to port site 1-1.5 hours before treatment. Cover with plastic wrap. 30 g 0  . OXYGEN Inhale 3 L into the lungs continuous.    . potassium chloride SA (K-DUR,KLOR-CON) 20 MEQ tablet Take 2 tablets (40 mEq total) by mouth daily. 180 tablet 3  . rivaroxaban (XARELTO) 20 MG TABS tablet Take 1 tablet (20 mg total) by mouth daily with supper. Begin 20 mg daily after starter pack is complete. 30 tablet 1  . Rivaroxaban 15 & 20 MG TBPK Take as directed on package: Start with one 15mg  tablet by mouth twice a day with food. On Day 22, switch to one 20mg  tablet once a day with food. 51 each 0   No current facility-administered medications for this visit.    Facility-Administered Medications Ordered in Other Visits  Medication Dose Route Frequency Provider Last Rate Last Dose  . durvalumab (IMFINZI) 860 mg in sodium chloride 0.9 % 100 mL chemo infusion  9.8 mg/kg (Treatment Plan Recorded) Intravenous Once Curt Bears, MD 117 mL/hr at 02/13/18 1356 860 mg at 02/13/18 1356  . heparin lock flush 100 unit/mL  500 Units Intracatheter Once PRN Curt Bears, MD      . sodium chloride flush (NS) 0.9 % injection 10 mL  10 mL Intracatheter  PRN Curt Bears, MD        SURGICAL HISTORY:  Past Surgical History:  Procedure Laterality Date  . CHOLECYSTECTOMY    . COLONOSCOPY N/A 07/31/2016   Procedure: COLONOSCOPY;  Surgeon: Danie Binder, MD;  Location: [facility];  Service: Endoscopy;  Laterality: N/A;  1:45 pm  . ESOPHAGOGASTRODUODENOSCOPY N/A 07/31/2016   Procedure: ESOPHAGOGASTRODUODENOSCOPY (EGD);  Surgeon: Danie Binder, MD;  Location: [facility];  Service: Endoscopy;  Laterality: N/A;  .  EUS N/A 06/07/2017   Procedure: UPPER ENDOSCOPIC ULTRASOUND (EUS) LINEAR;  Surgeon: Milus Banister, MD;  Location: [facility];  Service: Endoscopy;  Laterality: N/A;  . HERNIA REPAIR    . IR FLUORO GUIDE PORT INSERTION RIGHT  07/11/2017  . IR US GUIDE VASC ACCESS RIGHT  07/11/2017    REVIEW OF SYSTEMS:   Review of Systems  Constitutional: Negative for appetite change, chills, fatigue, fever and unexpected weight change.  HENT:   Negative for mouth sores, nosebleeds, sore throat and trouble swallowing.   Eyes: Negative for eye problems and icterus.  Respiratory: Negative for cough, hemoptysis, and wheezing.  He has baseline shortness of breath and wears home oxygen. Cardiovascular: Negative for chest pain.  Positive for stable bilateral lower extremity edema.  Gastrointestinal: Negative for abdominal pain, constipation, diarrhea, nausea and vomiting.  Genitourinary: Negative for bladder incontinence, difficulty urinating, dysuria, frequency and hematuria.   Musculoskeletal: Negative for back pain, gait problem, neck pain and neck stiffness.  Skin: Negative for itching and rash.  Neurological: Negative for dizziness, extremity weakness, gait problem, headaches, light-headedness and seizures.  Hematological: Negative for adenopathy. Does not bruise/bleed easily.  Psychiatric/Behavioral: Negative for confusion, depression and sleep disturbance. The patient is not nervous/anxious.     PHYSICAL EXAMINATION:  Blood pressure (!) 101/58, pulse 100, temperature 97.7 F (36.5 C), temperature source Oral, resp. rate 18, height 5\' 6"  (1.676 m), weight 202 lb 9.6 oz (91.9 kg), SpO2 96 %.  ECOG PERFORMANCE STATUS: 1 - Symptomatic but completely ambulatory  Physical Exam  Constitutional: Oriented to person, place, and time and well-developed, well-nourished, and in no distress. No distress.  HENT:  Head: Normocephalic and atraumatic.  Mouth/Throat: Oropharynx is clear and moist. No oropharyngeal  exudate.  Eyes: Conjunctivae are normal. Right eye exhibits no discharge. Left eye exhibits no discharge. No scleral icterus.  Neck: Normal range of motion. Neck supple.  Cardiovascular: Normal rate, regular rhythm, normal heart sounds and intact distal pulses.  1+ edema to his bilateral legs.   Pulmonary/Chest: Effort normal and breath sounds normal. No respiratory distress. No wheezes. No rales.  Abdominal: Soft. Bowel sounds are normal. Exhibits no distension and no mass. There is no tenderness.  Musculoskeletal: Normal range of motion. Exhibits no edema.  Lymphadenopathy:    No cervical adenopathy.  Neurological: Alert and oriented to person, place, and time. Exhibits normal muscle tone. Gait normal. Coordination normal.  Skin: Skin is warm and dry. No rash noted. Not diaphoretic. No erythema. No pallor.  Psychiatric: Mood, memory and judgment normal.  Vitals reviewed.  LABORATORY DATA: Lab Results  Component Value Date   WBC 5.5 02/13/2018   HGB 10.1 (L) 02/13/2018   HCT 33.8 (L) 02/13/2018   MCV 80.7 02/13/2018   PLT 217 02/13/2018      Chemistry      Component Value Date/Time   NA 139 02/13/2018 1047   NA 139 09/07/2017 1313   K 3.9 02/13/2018 1047   K 3.8 09/07/2017 1313  CL 101 02/13/2018 1047   CO2 32 (H) 02/13/2018 1047   CO2 27 09-Oct-202018 1313   BUN 8 02/13/2018 1047   BUN 9.8 09-Oct-202018 1313   CREATININE 0.96 02/13/2018 1047   CREATININE 0.9 09-Oct-202018 1313      Component Value Date/Time   CALCIUM 9.1 02/13/2018 1047   CALCIUM 8.6 09-Oct-202018 1313   ALKPHOS 78 02/13/2018 1047   ALKPHOS 57 09-Oct-202018 1313   AST 9 02/13/2018 1047   AST 9 09-Oct-202018 1313   ALT <6 02/13/2018 1047   ALT 6 09-Oct-202018 1313   BILITOT 0.5 02/13/2018 1047   BILITOT 0.23 09-Oct-202018 1313       RADIOGRAPHIC STUDIES:  Ct Chest W Contrast  Result Date: 01/28/2018 CLINICAL DATA:  Stage IIIA left lower lobe adenocarcinoma diagnosed in 2018 post chemo radiation therapy.  Immunotherapy ongoing. EXAM: CT CHEST WITH CONTRAST TECHNIQUE: Multidetector CT imaging of the chest was performed during intravenous contrast administration. CONTRAST:  34mL ISOVUE-300 IOPAMIDOL (ISOVUE-300) INJECTION 61% COMPARISON:  CT 11/06/2017 and 07/30/2017. FINDINGS: Cardiovascular: No acute vascular findings are evident on noncontrast imaging. There is stable atherosclerosis of the aortic arch. There is central enlargement of the pulmonary arteries consistent with pulmonary arterial hypertension. Right IJ Port-A-Cath tip is in the superior aspect of the right atrium. The heart size is normal. There is no pericardial effusion. Mediastinum/Nodes: Mediastinal lymph nodes appear stable, including and 9 mm AP window node on image 42/2 and a left paratracheal node measuring 12 mm on image 44/2.No progressive adenopathy. There is a stable small left thyroid nodule. The trachea and esophagus appear unremarkable. Lungs/Pleura: The left pleural effusion has decreased in volume. There may be a small loculated component posteriorly in the lower left hemithorax. There is overall improved aeration of the left lower lobe. The previously demonstrated left lower lobe mass is largely obscured by presumed radiation changes. There is no progression to suggest local recurrence. There are no new or enlarging pulmonary nodules. Moderate centrilobular emphysema noted. Upper abdomen: No suspicious findings within the visualized upper abdomen. Known left adrenal nodule is incompletely visualized. Musculoskeletal/Chest wall: There is no chest wall mass or suspicious osseous finding. IMPRESSION: 1. Evolving radiation changes posteromedially in the left hemithorax, largely obscuring the previously demonstrated left lower lobe mass. The overall left lower lobe aeration has improved, and the left pleural effusion has decreased in volume. 2. Stable mildly prominent mediastinal lymph nodes. No evidence of disease progression. 3. Central  enlargement of the pulmonary arteries consistent with pulmonary arterial hypertension. No recurrent pulmonary embolism identified on routine imaging. 4.  Emphysema (ICD10-J43.9). Electronically Signed   By: Richardean Sale M.D.   On: 01/28/2018 15:58     ASSESSMENT/PLAN:  Adenocarcinoma of left lung, stage 3 ([place]) This is a very pleasant 62 year old African-American male recently diagnosed with a stage IIIA non-small cell lung cancer, adenocarcinoma presented with left lower lobe lung mass in addition to mediastinal lymphadenopathy.  The patient underwent a course of concurrent chemoradiation with weekly carboplatin and paclitaxel status post 7 cycles and tolerated his treatment fairly well. He is currently undergoing treatment with consolidation immunotherapy with Imfinzi (Durvalumab) status post 13 cycles.   The patient continues to tolerate this treatment well with no concerning complaints. Recommend for the patient to proceed with cycle 14 of his treatment as scheduled today.  He will come back for follow-up visit in 2 weeks for evaluation before starting cycle #15.  The patient was advised to call immediately if he has any  concerning symptoms in the interval. The patient voices understanding of current disease status and treatment options and is in agreement with the current care plan. All questions were answered. The patient knows to call the clinic with any problems, questions or concerns. We can certainly see the patient much sooner if necessary.   No orders of the defined types were placed in this encounter.  Mikey Bussing, DNP, AGPCNP-BC, AOCNP 02/13/18

## 2018-02-15 DIAGNOSIS — I5033 Acute on chronic diastolic (congestive) heart failure: Secondary | ICD-10-CM | POA: Diagnosis not present

## 2018-02-15 DIAGNOSIS — I110 Hypertensive heart disease with heart failure: Secondary | ICD-10-CM | POA: Diagnosis not present

## 2018-02-15 DIAGNOSIS — J449 Chronic obstructive pulmonary disease, unspecified: Secondary | ICD-10-CM | POA: Diagnosis not present

## 2018-02-15 DIAGNOSIS — C3492 Malignant neoplasm of unspecified part of left bronchus or lung: Secondary | ICD-10-CM | POA: Diagnosis not present

## 2018-02-15 DIAGNOSIS — M6281 Muscle weakness (generalized): Secondary | ICD-10-CM | POA: Diagnosis not present

## 2018-02-15 DIAGNOSIS — Z87891 Personal history of nicotine dependence: Secondary | ICD-10-CM | POA: Diagnosis not present

## 2018-02-19 DIAGNOSIS — I110 Hypertensive heart disease with heart failure: Secondary | ICD-10-CM | POA: Diagnosis not present

## 2018-02-19 DIAGNOSIS — C3492 Malignant neoplasm of unspecified part of left bronchus or lung: Secondary | ICD-10-CM | POA: Diagnosis not present

## 2018-02-19 DIAGNOSIS — M6281 Muscle weakness (generalized): Secondary | ICD-10-CM | POA: Diagnosis not present

## 2018-02-19 DIAGNOSIS — J449 Chronic obstructive pulmonary disease, unspecified: Secondary | ICD-10-CM | POA: Diagnosis not present

## 2018-02-19 DIAGNOSIS — I5033 Acute on chronic diastolic (congestive) heart failure: Secondary | ICD-10-CM | POA: Diagnosis not present

## 2018-02-19 DIAGNOSIS — Z87891 Personal history of nicotine dependence: Secondary | ICD-10-CM | POA: Diagnosis not present

## 2018-02-20 ENCOUNTER — Ambulatory Visit (INDEPENDENT_AMBULATORY_CARE_PROVIDER_SITE_OTHER): Payer: Medicare Other | Admitting: Student

## 2018-02-20 ENCOUNTER — Encounter: Payer: Self-pay | Admitting: Student

## 2018-02-20 VITALS — BP 112/74 | HR 94 | Ht 66.0 in | Wt 205.0 lb

## 2018-02-20 DIAGNOSIS — J449 Chronic obstructive pulmonary disease, unspecified: Secondary | ICD-10-CM

## 2018-02-20 DIAGNOSIS — I5032 Chronic diastolic (congestive) heart failure: Secondary | ICD-10-CM | POA: Diagnosis not present

## 2018-02-20 DIAGNOSIS — C3490 Malignant neoplasm of unspecified part of unspecified bronchus or lung: Secondary | ICD-10-CM

## 2018-02-20 DIAGNOSIS — I10 Essential (primary) hypertension: Secondary | ICD-10-CM

## 2018-02-20 DIAGNOSIS — R600 Localized edema: Secondary | ICD-10-CM

## 2018-02-20 MED ORDER — FUROSEMIDE 80 MG PO TABS: 80.0000 mg | ORAL_TABLET | Freq: Every day | ORAL | 3 refills | Status: DC

## 2018-02-20 NOTE — Progress Notes (Signed)
Cardiology Office Note    Date:  02/20/2018   ID:  James Zamora, James Zamora 1955-12-03, MRN [id number]  PCP:  [name], MD  Cardiologist: [name], MD    Chief Complaint  Patient presents with  . Follow-up    2 week visit    History of Present Illness:    James Zamora is a 62 y.o. male with past medical history of chronic diastolic CHF, HTN, COPD (on 3L [place] at baseline), history of PE (on Xarelto) and NSCLC who presents to the office today for 2-week follow-up.  He was recently examined by Dr. Harrington Challenger on 02/07/2018 and reported worsening dyspnea on exertion and lower extremity edema over the past several months. Weight was 202 lbs at that time and Lasix was further titrated to 60mg  daily and close follow-up was arranged.   In talking with the patient today, he reports overall doing well since his most recent office visit. Most history is provided by his sister who reports that he sits in a chair throughout the day and sleeps in the chair at night and does not elevate his lower extremities during this timeframe. An order for a lift chair has been requested and his sister is waiting to hear back on the results of this from his home health agency. Reports following a low-sodium diet and consuming less than 2 L of fluid daily.  He reports breathing has been at baseline and denies any recent changes in his respiratory status. No recent orthopnea, PND, chest discomfort, or palpitations. He does have persistent lower extremity edema and is unsure if this has changed since his most recent visit. He follows his weight at home and says this has declined to 198 lbs.  Past Medical History:  Diagnosis Date  . Adenocarcinoma of left lung, stage 3 ([place]) 05/10/2017  . Aortic atherosclerosis ([place]) 07/31/2017  . Asthma   . Chronic diastolic CHF (congestive heart failure) ([place])   . Chronic fatigue 08/02/2017  . Lung mass   . Non-small cell lung cancer ([place]) 11/13/2015  . Obesity   . Perforated  bowel [facility])     Past Surgical History:  Procedure Laterality Date  . CHOLECYSTECTOMY    . COLONOSCOPY N/A 07/31/2016   Procedure: COLONOSCOPY;  Surgeon: Danie Binder, MD;  [facility];  Service: Endoscopy;  Laterality: N/A;  1:45 pm  . ESOPHAGOGASTRODUODENOSCOPY N/A 07/31/2016   Procedure: ESOPHAGOGASTRODUODENOSCOPY (EGD);  Surgeon: Danie Binder, MD;  Location[facility];  Service: Endoscopy;  Laterality: N/A;  . EUS N/A 06/07/2017   Procedure: UPPER ENDOSCOPIC ULTRASOUND (EUS) LINEAR;  Surgeon: Milus Banister, MD;  Location: [facility];  Service: Endoscopy;  Laterality: N/A;  . HERNIA REPAIR    . IR FLUORO GUIDE PORT INSERTION RIGHT  07/11/2017  . IR US GUIDE VASC ACCESS RIGHT  07/11/2017    Current Medications: Outpatient Medications Prior to Visit  Medication Sig Dispense Refill  . aspirin EC 81 MG EC tablet Take 1 tablet (81 mg total) by mouth daily. 30 tablet 1  . fluticasone furoate-vilanterol (BREO ELLIPTA) 200-25 MCG/INH AEPB Inhale 1 puff into the lungs daily. 1 each 5  . lidocaine-prilocaine (EMLA) cream Apply 1 application topically as needed. Apply small amount to port site 1-1.5 hours before treatment. Cover with plastic wrap. 30 g 0  . OXYGEN Inhale 3 L into the lungs continuous.    . potassium chloride SA (K-DUR,KLOR-CON) 20 MEQ tablet Take 2 tablets (40 mEq total) by mouth  daily. 180 tablet 3  . rivaroxaban (XARELTO) 20 MG TABS tablet Take 1 tablet (20 mg total) by mouth daily with supper. Begin 20 mg daily after starter pack is complete. 30 tablet 1  . Rivaroxaban 15 & 20 MG TBPK Take as directed on package: Start with one 15mg  tablet by mouth twice a day with food. On Day 22, switch to one 20mg  tablet once a day with food. 51 each 0  . furosemide (LASIX) 40 MG tablet Take 1.5 tablets (60 mg total) by mouth daily. 135 tablet 3   No facility-administered medications prior to visit.      Allergies:   Bee venom and Penicillins   Social History    Socioeconomic History  . Marital status: Single    Spouse name: Not on file  . Number of children: Not on file  . Years of education: Not on file  . Highest education level: Not on file  Occupational History  . Not on file  Social Needs  . Financial resource strain: Not on file  . Food insecurity:    Worry: Not on file    Inability: Not on file  . Transportation needs:    Medical: Not on file    Non-medical: Not on file  Tobacco Use  . Smoking status: Former Smoker    Packs/day: 2.00    Years: 10.00    Pack years: 20.00    Types: Cigarettes    Last attempt to quit: 07/06/2012    Years since quitting: 5.6  . Smokeless tobacco: Former Systems developer    Types: [place] date: 07/06/2014  Substance and Sexual Activity  . Alcohol use: No  . Drug use: No  . Sexual activity: Never  Lifestyle  . Physical activity:    Days per week: Not on file    Minutes per session: Not on file  . Stress: Not on file  Relationships  . Social connections:    Talks on phone: Not on file    Gets together: Not on file    Attends religious service: Not on file    Active member of club or organization: Not on file    Attends meetings of clubs or organizations: Not on file    Relationship status: Not on file  Other Topics Concern  . Not on file  Social History Narrative  . Not on file     Family History:  The patient's family history includes Asthma in his father; Diabetes in his mother; Hypertension in his father and mother; Prostate cancer in his father.   Review of Systems:   Please see the history of present illness.     General:  No chills, fever, night sweats or weight changes.  Cardiovascular:  No chest pain, orthopnea, palpitations, paroxysmal nocturnal dyspnea. Positive for dyspnea on exertion (at baseline) and edema.  Dermatological: No rash, lesions/masses Respiratory: No cough, dyspnea Urologic: No hematuria, dysuria Abdominal:   No nausea, vomiting, diarrhea, bright red blood per  rectum, melena, or hematemesis Neurologic:  No visual changes, wkns, changes in mental status. All other systems reviewed and are otherwise negative except as noted above.   Physical Exam:    VS:  BP 112/74 (BP Location: Left Arm)   Pulse 94   Ht 5\' 6"  (1.676 m)   Wt 205 lb (93 kg)   SpO2 90% Comment: 4 L O2  BMI 33.09 kg/m    General: Well developed, African American male appearing in no acute distress. Head:  Normocephalic, atraumatic, sclera non-icteric, no xanthomas, nares are without discharge.  Neck: No carotid bruits. JVD not elevated.  Lungs: Respirations regular and unlabored, without wheezes or rales. On 4L [place].  Heart: Regular rate and rhythm. No S3 or S4.  No murmur, no rubs, or gallops appreciated. Abdomen: Soft, non-tender, non-distended with normoactive bowel sounds. No hepatomegaly. No rebound/guarding. No obvious abdominal masses. Msk:  Strength and tone appear normal for age. No joint deformities or effusions. Extremities: No clubbing or cyanosis. Chronic 2+ pitting edema with xerosis. Distal pedal pulses are 2+ bilaterally. Neuro: Alert and oriented X 3. Moves all extremities spontaneously. No focal deficits noted. Psych:  Responds to questions appropriately with a normal affect. Skin: No rashes or lesions noted  Wt Readings from Last 3 Encounters:  02/20/18 205 lb (93 kg)  02/13/18 202 lb 9.6 oz (91.9 kg)  02/07/18 202 lb (91.6 kg)     Studies/Labs Reviewed:   EKG:  EKG is not ordered today.    Recent Labs: 03/08/2017: B Natriuretic Peptide 19.0 07/30/2017: Magnesium 1.6 01/30/2018: TSH 0.815 02/13/2018: ALT <6; BUN 8; Creatinine, Ser 0.96; Hemoglobin 10.1; Platelets 217; Potassium 3.9; Sodium 139   Lipid Panel No results found for: CHOL, TRIG, HDL, CHOLHDL, VLDL, LDLCALC, LDLDIRECT  Additional studies/ records that were reviewed today include:   Echocardiogram: 08/01/2017 Study Conclusions  - Left ventricle: The cavity size was normal. Wall thickness  was   normal. Systolic function was vigorous. The estimated ejection   fraction was in the range of 65% to 70%. Wall motion was normal;   there were no regional wall motion abnormalities. Doppler   parameters are consistent with abnormal left ventricular   relaxation (grade 1 diastolic dysfunction).  Assessment:    1. Chronic diastolic CHF (congestive heart failure) ([place])   2. Bilateral lower extremity edema   3. Essential hypertension   4. Chronic obstructive pulmonary disease, unspecified COPD type ([place])   5. Non-small cell lung cancer, unspecified laterality ([place])      Plan:   In order of problems listed above:  1. Chronic Diastolic CHF/ Lower Extremity Edema - The patient continues to experience significant lower extremity edema. Thankfully, he denies any changes in his respiratory status and lungs are overall clear on examination today. While weight has trended upwards on the office scales, he has noted a decline on his home scales. - Labs were recently obtained on 02/13/2018 and showed kidney function remained stable with a creatinine of 0.96 and potassium was 3.9. With his continued edema, will further titrate Lasix to 80 mg daily. He has repeat lab work scheduled for 02/27/2018 and will have a repeat BMET at that time.  I strongly recommended that he elevate his lower extremities or consider the use of compression stockings but he has declined to do so thus far. His sister does report a lift chair has been ordered. Sodium and fluid restriction also reviewed.   2. HTN - BP is well controlled at 112/74 during today's visit. - Continue current medication regimen with adjustments to Lasix dosing as outlined above.  3. COPD - On 3 to 4 L nasal cannula at baseline.  4. NSCLC - Being followed by Oncology. Currently undergoing treatments as outlined in Oncology progress notes by review of Epic.   Medication Adjustments/Labs and Tests Ordered: Current medicines are reviewed at length  with the patient today.  Concerns regarding medicines are outlined above.  Medication changes, Labs and Tests ordered today are listed in the Patient Instructions  below. Patient Instructions  Medication Instructions:  Your physician has recommended you make the following change in your medication:  Increase Lasix to 80 mg Daily   Labwork: NONE   Testing/Procedures: NONE   Follow-Up: Your physician recommends that you schedule a follow-up appointment in: 4 Weeks   Any Other Special Instructions Will Be Listed Below (If Applicable).  If you need a refill on your cardiac medications before your next appointment, please call your pharmacy.  Thank you for choosing [facility]!    Signed, Erma Heritage, PA-C  02/20/2018 5:29 PM    [facility] [address] [place], [place] [address] Phone: [phone]

## 2018-02-20 NOTE — Patient Instructions (Signed)
Medication Instructions:  Your physician has recommended you make the following change in your medication:  Increase Lasix to 80 mg Daily    Labwork: NONE   Testing/Procedures: NONE   Follow-Up: Your physician recommends that you schedule a follow-up appointment in: 4 Weeks    Any Other Special Instructions Will Be Listed Below (If Applicable).     If you need a refill on your cardiac medications before your next appointment, please call your pharmacy.  Thank you for choosing [facility]!

## 2018-02-22 DIAGNOSIS — I5033 Acute on chronic diastolic (congestive) heart failure: Secondary | ICD-10-CM | POA: Diagnosis not present

## 2018-02-22 DIAGNOSIS — C3492 Malignant neoplasm of unspecified part of left bronchus or lung: Secondary | ICD-10-CM | POA: Diagnosis not present

## 2018-02-22 DIAGNOSIS — M6281 Muscle weakness (generalized): Secondary | ICD-10-CM | POA: Diagnosis not present

## 2018-02-22 DIAGNOSIS — I110 Hypertensive heart disease with heart failure: Secondary | ICD-10-CM | POA: Diagnosis not present

## 2018-02-22 DIAGNOSIS — J449 Chronic obstructive pulmonary disease, unspecified: Secondary | ICD-10-CM | POA: Diagnosis not present

## 2018-02-22 DIAGNOSIS — Z87891 Personal history of nicotine dependence: Secondary | ICD-10-CM | POA: Diagnosis not present

## 2018-02-25 ENCOUNTER — Telehealth: Payer: Self-pay | Admitting: Oncology

## 2018-02-25 NOTE — Telephone Encounter (Signed)
Patient called to reschedule  °

## 2018-02-26 ENCOUNTER — Ambulatory Visit: Payer: Medicare Other | Admitting: Internal Medicine

## 2018-02-26 DIAGNOSIS — Z87891 Personal history of nicotine dependence: Secondary | ICD-10-CM | POA: Diagnosis not present

## 2018-02-26 DIAGNOSIS — I110 Hypertensive heart disease with heart failure: Secondary | ICD-10-CM | POA: Diagnosis not present

## 2018-02-26 DIAGNOSIS — C3492 Malignant neoplasm of unspecified part of left bronchus or lung: Secondary | ICD-10-CM | POA: Diagnosis not present

## 2018-02-26 DIAGNOSIS — I5033 Acute on chronic diastolic (congestive) heart failure: Secondary | ICD-10-CM | POA: Diagnosis not present

## 2018-02-26 DIAGNOSIS — J449 Chronic obstructive pulmonary disease, unspecified: Secondary | ICD-10-CM | POA: Diagnosis not present

## 2018-02-26 DIAGNOSIS — M6281 Muscle weakness (generalized): Secondary | ICD-10-CM | POA: Diagnosis not present

## 2018-02-27 ENCOUNTER — Inpatient Hospital Stay: Payer: Medicare Other

## 2018-02-27 ENCOUNTER — Encounter: Payer: Self-pay | Admitting: Oncology

## 2018-02-27 ENCOUNTER — Inpatient Hospital Stay: Payer: Medicare Other | Attending: Internal Medicine | Admitting: Oncology

## 2018-02-27 ENCOUNTER — Telehealth: Payer: Self-pay | Admitting: Oncology

## 2018-02-27 VITALS — BP 111/79 | HR 106 | Temp 98.0°F | Resp 18 | Ht 66.0 in | Wt 202.1 lb

## 2018-02-27 VITALS — HR 79

## 2018-02-27 DIAGNOSIS — I509 Heart failure, unspecified: Secondary | ICD-10-CM | POA: Insufficient documentation

## 2018-02-27 DIAGNOSIS — C3432 Malignant neoplasm of lower lobe, left bronchus or lung: Secondary | ICD-10-CM

## 2018-02-27 DIAGNOSIS — Z7901 Long term (current) use of anticoagulants: Secondary | ICD-10-CM | POA: Diagnosis not present

## 2018-02-27 DIAGNOSIS — I2699 Other pulmonary embolism without acute cor pulmonale: Secondary | ICD-10-CM | POA: Insufficient documentation

## 2018-02-27 DIAGNOSIS — I2510 Atherosclerotic heart disease of native coronary artery without angina pectoris: Secondary | ICD-10-CM | POA: Diagnosis not present

## 2018-02-27 DIAGNOSIS — C3492 Malignant neoplasm of unspecified part of left bronchus or lung: Secondary | ICD-10-CM

## 2018-02-27 DIAGNOSIS — J449 Chronic obstructive pulmonary disease, unspecified: Secondary | ICD-10-CM | POA: Diagnosis not present

## 2018-02-27 DIAGNOSIS — I110 Hypertensive heart disease with heart failure: Secondary | ICD-10-CM | POA: Diagnosis not present

## 2018-02-27 DIAGNOSIS — R5382 Chronic fatigue, unspecified: Secondary | ICD-10-CM

## 2018-02-27 DIAGNOSIS — R251 Tremor, unspecified: Secondary | ICD-10-CM | POA: Diagnosis not present

## 2018-02-27 DIAGNOSIS — Z87891 Personal history of nicotine dependence: Secondary | ICD-10-CM | POA: Diagnosis not present

## 2018-02-27 DIAGNOSIS — I5033 Acute on chronic diastolic (congestive) heart failure: Secondary | ICD-10-CM | POA: Diagnosis not present

## 2018-02-27 DIAGNOSIS — Z5112 Encounter for antineoplastic immunotherapy: Secondary | ICD-10-CM | POA: Insufficient documentation

## 2018-02-27 DIAGNOSIS — Z79899 Other long term (current) drug therapy: Secondary | ICD-10-CM | POA: Diagnosis not present

## 2018-02-27 DIAGNOSIS — M6281 Muscle weakness (generalized): Secondary | ICD-10-CM | POA: Diagnosis not present

## 2018-02-27 LAB — CBC WITH DIFFERENTIAL/PLATELET
Basophils Absolute: 0 10*3/uL (ref 0.0–0.1)
Basophils Relative: 0 %
EOS ABS: 0.2 10*3/uL (ref 0.0–0.5)
Eosinophils Relative: 4 %
HEMATOCRIT: 33.4 % — AB (ref 38.4–49.9)
HEMOGLOBIN: 10 g/dL — AB (ref 13.0–17.1)
LYMPHS ABS: 1.1 10*3/uL (ref 0.9–3.3)
Lymphocytes Relative: 25 %
MCH: 23.8 pg — AB (ref 27.2–33.4)
MCHC: 29.9 g/dL — AB (ref 32.0–36.0)
MCV: 79.5 fL (ref 79.3–98.0)
MONO ABS: 0.3 10*3/uL (ref 0.1–0.9)
MONOS PCT: 8 %
NEUTROS ABS: 2.9 10*3/uL (ref 1.5–6.5)
Neutrophils Relative %: 63 %
Platelets: 225 10*3/uL (ref 140–400)
RBC: 4.2 MIL/uL (ref 4.20–5.82)
RDW: 17.7 % — AB (ref 11.0–14.6)
WBC: 4.6 10*3/uL (ref 4.0–10.3)

## 2018-02-27 LAB — COMPREHENSIVE METABOLIC PANEL
ALK PHOS: 80 U/L (ref 40–150)
ALT: <6 U/L (ref 0–55)
ANION GAP: 6 (ref 3–11)
AST: 8 U/L (ref 5–34)
Albumin: 3.3 g/dL — ABNORMAL LOW (ref 3.5–5.0)
BILIRUBIN TOTAL: 0.4 mg/dL (ref 0.2–1.2)
BUN: 9 mg/dL (ref 7–26)
CALCIUM: 9.2 mg/dL (ref 8.4–10.4)
CO2: 31 mmol/L — ABNORMAL HIGH (ref 22–29)
Chloride: 101 mmol/L (ref 98–109)
Creatinine, Ser: 1 mg/dL (ref 0.70–1.30)
GFR calc non Af Amer: >60 mL/min (ref >60)
GLUCOSE: 81 mg/dL (ref 70–140)
POTASSIUM: 3.9 mmol/L (ref 3.5–5.1)
Sodium: 138 mmol/L (ref 136–145)
TOTAL PROTEIN: 7.8 g/dL (ref 6.4–8.3)

## 2018-02-27 LAB — TSH: TSH: 0.414 u[IU]/mL (ref 0.320–4.118)

## 2018-02-27 MED ORDER — SODIUM CHLORIDE 0.9 % IV SOLN
Freq: Once | INTRAVENOUS | Status: AC
  Administered 2018-02-27: 15:00:00 via INTRAVENOUS

## 2018-02-27 MED ORDER — SODIUM CHLORIDE 0.9% FLUSH
10.0000 mL | INTRAVENOUS | Status: DC | PRN
  Administered 2018-02-27: 10 mL
  Filled 2018-02-27: qty 10

## 2018-02-27 MED ORDER — HEPARIN SOD (PORK) LOCK FLUSH 100 UNIT/ML IV SOLN
500.0000 [IU] | Freq: Once | INTRAVENOUS | Status: AC | PRN
  Administered 2018-02-27: 500 [IU]
  Filled 2018-02-27: qty 5

## 2018-02-27 MED ORDER — SODIUM CHLORIDE 0.9% FLUSH
10.0000 mL | Freq: Once | INTRAVENOUS | Status: AC
  Administered 2018-02-27: 10 mL
  Filled 2018-02-27: qty 10

## 2018-02-27 MED ORDER — SODIUM CHLORIDE 0.9 % IV SOLN
860.0000 mg | Freq: Once | INTRAVENOUS | Status: AC
  Administered 2018-02-27: 860 mg via INTRAVENOUS
  Filled 2018-02-27: qty 7.2

## 2018-02-27 NOTE — Patient Instructions (Signed)
[facility] Discharge Instructions for Patients Receiving Chemotherapy  Today you received the following chemotherapy agents: Imfinzi.  To help prevent nausea and vomiting after your treatment, we encourage you to take your nausea medication as directed.   If you develop nausea and vomiting that is not controlled by your nausea medication, call the clinic.   BELOW ARE SYMPTOMS THAT SHOULD BE REPORTED IMMEDIATELY:  *FEVER GREATER THAN 100.5 F  *CHILLS WITH OR WITHOUT FEVER  NAUSEA AND VOMITING THAT IS NOT CONTROLLED WITH YOUR NAUSEA MEDICATION  *UNUSUAL SHORTNESS OF BREATH  *UNUSUAL BRUISING OR BLEEDING  TENDERNESS IN MOUTH AND THROAT WITH OR WITHOUT PRESENCE OF ULCERS  *URINARY PROBLEMS  *BOWEL PROBLEMS  UNUSUAL RASH Items with * indicate a potential emergency and should be followed up as soon as possible.  Feel free to call the clinic should you have any questions or concerns. The clinic phone number is [phone].  Please show the CHEMO ALERT CARD at check-in to the Emergency Department and triage nurse.   

## 2018-02-27 NOTE — Telephone Encounter (Signed)
Scheduled appt oer 5/1 los - pt to get an updated schedule in the treatment area.

## 2018-02-27 NOTE — Progress Notes (Signed)
[facility] OFFICE PROGRESS NOTE  James Beard, MD [address] [place] [address]  DIAGNOSIS: Stage IIIA (T2a, N2, M0) non-small cell lung cancer, adenocarcinoma presented with left lower lobe lung mass in addition to mediastinal lymphadenopathy diagnosed in June 2018. The patient also has suspicious soft tissue density in the pancreatic head but this has been present for 3 years on previous CT scan and unlikely to be related to [name], last dose was given 07/03/2017.  CURRENT THERAPY: Consolidation treatment with immunotherapy with Imfinzi (Durvalumab) 10 MG/KG every 2 weeks, first dose 08/15/2017. Status post 14cycles.  INTERVAL HISTORY: James Zamora 62 y.o. male returns for routine follow-up visit by himself.  Patient is feeling fine today with no specific complaints.  Patient denies fevers and chills.  Denies chest pain, cough, hemoptysis.  The patient has his baseline shortness of breath and wears home oxygen.  Denies nausea, vomiting, constipation, diarrhea.  Denies skin rashes.  His lower extremity edema is stable.  Patient continues to tolerate treatment with immunotherapy fairly well.  Patient is here for evaluation prior to cycle #15 of his treatment.  MEDICAL HISTORY: Past Medical History:  Diagnosis Date  . Adenocarcinoma of left lung, stage 3 ([place]) 05/10/2017  . Aortic atherosclerosis ([place]) 07/31/2017  . Asthma   . Chronic diastolic CHF (congestive heart failure) ([place])   . Chronic fatigue 08/02/2017  . Lung mass   . Non-small cell lung cancer ([place]) 11/13/2015  . Obesity   . Perforated bowel ([place])     ALLERGIES:  is allergic to bee venom and penicillins.  MEDICATIONS:  Current Outpatient Medications  Medication Sig Dispense Refill  . aspirin EC 81 MG EC tablet Take 1 tablet (81 mg  total) by mouth daily. 30 tablet 1  . fluticasone furoate-vilanterol (BREO ELLIPTA) 200-25 MCG/INH AEPB Inhale 1 puff into the lungs daily. 1 each 5  . furosemide (LASIX) 80 MG tablet Take 1 tablet (80 mg total) by mouth daily. 90 tablet 3  . lidocaine-prilocaine (EMLA) cream Apply 1 application topically as needed. Apply small amount to port site 1-1.5 hours before treatment. Cover with plastic wrap. 30 g 0  . OXYGEN Inhale 3 L into the lungs continuous.    . potassium chloride SA (K-DUR,KLOR-CON) 20 MEQ tablet Take 2 tablets (40 mEq total) by mouth daily. 180 tablet 3  . rivaroxaban (XARELTO) 20 MG TABS tablet Take 1 tablet (20 mg total) by mouth daily with supper. Begin 20 mg daily after starter pack is complete. 30 tablet 1  . Rivaroxaban 15 & 20 MG TBPK Take as directed on package: Start with one 15mg  tablet by mouth twice a day with food. On Day 22, switch to one 20mg  tablet once a day with food. 51 each 0   No current facility-administered medications for this visit.    Facility-Administered Medications Ordered in Other Visits  Medication Dose Route Frequency Provider Last Rate Last Dose  . durvalumab (IMFINZI) 860 mg in sodium chloride 0.9 % 100 mL chemo infusion  860 mg Intravenous Once Curt Bears, MD 117 mL/hr at 02/27/18 1541 860 mg at 02/27/18 1541  . heparin lock flush 100 unit/mL  500 Units Intracatheter Once PRN Curt Bears, MD      . sodium chloride flush (NS) 0.9 % injection 10 mL  10 mL Intracatheter  PRN Curt Bears, MD        SURGICAL HISTORY:  Past Surgical History:  Procedure Laterality Date  . CHOLECYSTECTOMY    . COLONOSCOPY N/A 07/31/2016   Procedure: COLONOSCOPY;  Surgeon: Danie Binder, MD;  [facility];  Service: Endoscopy;  Laterality: N/A;  1:45 pm  . ESOPHAGOGASTRODUODENOSCOPY N/A 07/31/2016   Procedure: ESOPHAGOGASTRODUODENOSCOPY (EGD);  Surgeon: Danie Binder, MD;  [facility];  Service: Endoscopy;  Laterality: N/A;  .  EUS N/A 06/07/2017   Procedure: UPPER ENDOSCOPIC ULTRASOUND (EUS) LINEAR;  Surgeon: Milus Banister, MD;  [facility];  Service: Endoscopy;  Laterality: N/A;  . HERNIA REPAIR    . IR FLUORO GUIDE PORT INSERTION RIGHT  07/11/2017  . IR US GUIDE VASC ACCESS RIGHT  07/11/2017    REVIEW OF SYSTEMS:   Review of Systems  Constitutional: Negative for appetite change, chills, fatigue, fever and unexpected weight change.  HENT:   Negative for mouth sores, nosebleeds, sore throat and trouble swallowing.   Eyes: Negative for eye problems and icterus.  Respiratory: Negative for cough, hemoptysis, and wheezing.  The patient has baseline shortness of breath and wears home oxygen. Cardiovascular: Negative for chest pain.  The patient has baseline bilateral lower extremity edema.  Gastrointestinal: Negative for abdominal pain, constipation, diarrhea, nausea and vomiting.  Genitourinary: Negative for bladder incontinence, difficulty urinating, dysuria, frequency and hematuria.   Musculoskeletal: Negative for back pain, gait problem, neck pain and neck stiffness.  Skin: Negative for itching and rash.  Neurological: Negative for dizziness, extremity weakness, gait problem, headaches, light-headedness and seizures.  Hematological: Negative for adenopathy. Does not bruise/bleed easily.  Psychiatric/Behavioral: Negative for confusion, depression and sleep disturbance. The patient is not nervous/anxious.     PHYSICAL EXAMINATION:  Blood pressure 111/79, pulse (!) 106, temperature 98 F (36.7 C), temperature source Oral, resp. rate 18, height 5\' 6"  (1.676 m), weight 202 lb 1.6 oz (91.7 kg), SpO2 90 %.  ECOG PERFORMANCE STATUS: 1 - Symptomatic but completely ambulatory  Physical Exam  Constitutional: Oriented to person, place, and time and well-developed, well-nourished, and in no distress. No distress.  HENT:  Head: Normocephalic and atraumatic.  Mouth/Throat: Oropharynx is clear and moist. No  oropharyngeal exudate.  Eyes: Conjunctivae are normal. Right eye exhibits no discharge. Left eye exhibits no discharge. No scleral icterus.  Neck: Normal range of motion. Neck supple.  Cardiovascular: Normal rate, regular rhythm, normal heart sounds and intact distal pulses.  1+ bilateral lower extremity edema.  Pulmonary/Chest: Effort normal and breath sounds normal. No respiratory distress. No wheezes. No rales.  Abdominal: Soft. Bowel sounds are normal. Exhibits no distension and no mass. There is no tenderness.  Musculoskeletal: Normal range of motion.   Lymphadenopathy:    No cervical adenopathy.  Neurological: Alert and oriented to person, place, and time. Exhibits normal muscle tone. Gait normal. Coordination normal.  Skin: Skin is warm and dry. No rash noted. Not diaphoretic. No erythema. No pallor.  Psychiatric: Mood, memory and judgment normal.  Vitals reviewed.  LABORATORY DATA: Lab Results  Component Value Date   WBC 4.6 02/27/2018   HGB 10.0 (L) 02/27/2018   HCT 33.4 (L) 02/27/2018   MCV 79.5 02/27/2018   PLT 225 02/27/2018      Chemistry      Component Value Date/Time   NA 138 02/27/2018 1330   NA 139 03/09/202018 1313   K 3.9 02/27/2018 1330   K 3.8 03/09/202018 1313   CL 101  02/27/2018 1330   CO2 31 (H) 02/27/2018 1330   CO2 27 03/09/202018 1313   BUN 9 02/27/2018 1330   BUN 9.8 03/09/202018 1313   CREATININE 1.00 02/27/2018 1330   CREATININE 0.9 03/09/202018 1313      Component Value Date/Time   CALCIUM 9.2 02/27/2018 1330   CALCIUM 8.6 03/09/202018 1313   ALKPHOS 80 02/27/2018 1330   ALKPHOS 57 03/09/202018 1313   AST 8 02/27/2018 1330   AST 9 03/09/202018 1313   ALT <6 02/27/2018 1330   ALT 6 03/09/202018 1313   BILITOT 0.4 02/27/2018 1330   BILITOT 0.23 03/09/202018 1313       RADIOGRAPHIC STUDIES:  No results found.   ASSESSMENT/PLAN:  Adenocarcinoma of left lung, stage 3 (HCC) This is a very pleasant 62 year old African-American male recently diagnosed  with a stage IIIAnon-small cell lung cancer, adenocarcinoma presented with left lower lobe lung mass in addition to mediastinal lymphadenopathy.  The patient underwent a course of concurrent chemoradiation with weekly carboplatin and paclitaxel status post 7 cycles and tolerated his treatment fairly well. He is currently undergoing treatment with consolidation immunotherapy with Imfinzi (Durvalumab) status post 14cycles.  The patient continues to tolerate this treatment well with no concerning complaints. Recommend for the patient to proceed with cycle 15 of his treatment as scheduled today.  He will come back for follow-up visit in 2 weeks for evaluation before starting cycle #16.  The patient was advised to call immediately if he has any concerning symptoms in the interval. The patient voices understanding of current disease status and treatment options and is in agreement with the current care plan. All questions were answered. The patient knows to call the clinic with any problems, questions or concerns. We can certainly see the patient much sooner if necessary.   No orders of the defined types were placed in this encounter.  James Bussing, DNP, AGPCNP-BC, AOCNP 02/27/18

## 2018-02-27 NOTE — Assessment & Plan Note (Signed)
This is a very pleasant 62 year old African-American male recently diagnosed with a stage IIIAnon-small cell lung cancer, adenocarcinoma presented with left lower lobe lung mass in addition to mediastinal lymphadenopathy.  The patient underwent a course of concurrent chemoradiation with weekly carboplatin and paclitaxel status post 7 cycles and tolerated his treatment fairly well. He is currently undergoing treatment with consolidation immunotherapy with Imfinzi (Durvalumab) status post 14cycles.  The patient continues to tolerate this treatment well with no concerning complaints. Recommend for the patient to proceed with cycle 15 of his treatment as scheduled today.  He will come back for follow-up visit in 2 weeks for evaluation before starting cycle #16.  The patient was advised to call immediately if he has any concerning symptoms in the interval. The patient voices understanding of current disease status and treatment options and is in agreement with the current care plan. All questions were answered. The patient knows to call the clinic with any problems, questions or concerns. We can certainly see the patient much sooner if necessary.

## 2018-03-04 ENCOUNTER — Encounter: Payer: Self-pay | Admitting: Internal Medicine

## 2018-03-04 ENCOUNTER — Telehealth: Payer: Self-pay | Admitting: Internal Medicine

## 2018-03-04 ENCOUNTER — Inpatient Hospital Stay (HOSPITAL_BASED_OUTPATIENT_CLINIC_OR_DEPARTMENT_OTHER): Payer: Medicare Other | Admitting: Internal Medicine

## 2018-03-04 ENCOUNTER — Other Ambulatory Visit: Payer: Self-pay

## 2018-03-04 VITALS — BP 130/78 | HR 109 | Temp 98.1°F | Resp 18 | Ht 66.0 in | Wt 200.1 lb

## 2018-03-04 DIAGNOSIS — Z7901 Long term (current) use of anticoagulants: Secondary | ICD-10-CM | POA: Diagnosis not present

## 2018-03-04 DIAGNOSIS — Z87891 Personal history of nicotine dependence: Secondary | ICD-10-CM | POA: Diagnosis not present

## 2018-03-04 DIAGNOSIS — R251 Tremor, unspecified: Secondary | ICD-10-CM

## 2018-03-04 DIAGNOSIS — I2699 Other pulmonary embolism without acute cor pulmonale: Secondary | ICD-10-CM | POA: Diagnosis not present

## 2018-03-04 DIAGNOSIS — C3432 Malignant neoplasm of lower lobe, left bronchus or lung: Secondary | ICD-10-CM | POA: Diagnosis not present

## 2018-03-04 DIAGNOSIS — Z5112 Encounter for antineoplastic immunotherapy: Secondary | ICD-10-CM | POA: Diagnosis not present

## 2018-03-04 NOTE — Progress Notes (Signed)
[facility] [place] [place], [place] [address] [phone]   New Patient Evaluation  Date of Service: 03/04/18 Patient Name: James Zamora Patient MRN: [id number] Patient DOB: Nov 26, 1955 Provider: Ventura Sellers, MD  Identifying Statement:  James Zamora is a 62 y.o. male with tremor who presents for initial consultation and evaluation regarding cancer associated neurologic deficits.    Referring Provider: [name], [address] [place], [place] [address]  Primary Cancer: NSCLC stage III  Interval History:  James Zamora presents for follow up.  He describes notable improvement in his tremors, stiffness, and slow movements, despite our withholding treatment.  Only thing that has changed is an increase in the setting for oxygen flow to 4L (from 3).  He continues to recieve immunotherapy consolidation for his lung cancer.  Medications: Current Outpatient Medications on File Prior to Visit  Medication Sig Dispense Refill  . aspirin EC 81 MG EC tablet Take 1 tablet (81 mg total) by mouth daily. 30 tablet 1  . fluticasone furoate-vilanterol (BREO ELLIPTA) 200-25 MCG/INH AEPB Inhale 1 puff into the lungs daily. 1 each 5  . furosemide (LASIX) 80 MG tablet Take 1 tablet (80 mg total) by mouth daily. 90 tablet 3  . lidocaine-prilocaine (EMLA) cream Apply 1 application topically as needed. Apply small amount to port site 1-1.5 hours before treatment. Cover with plastic wrap. 30 g 0  . OXYGEN Inhale 3 L into the lungs continuous.    . potassium chloride SA (K-DUR,KLOR-CON) 20 MEQ tablet Take 2 tablets (40 mEq total) by mouth daily. 180 tablet 3  . rivaroxaban (XARELTO) 20 MG TABS tablet Take 1 tablet (20 mg total) by mouth daily with supper. Begin 20 mg daily after starter pack is complete. 30 tablet 1  . Rivaroxaban 15 & 20 MG TBPK Take as directed on package: Start with one 15mg  tablet by mouth twice a day with food. On  Day 22, switch to one 20mg  tablet once a day with food. 51 each 0   No current facility-administered medications on file prior to visit.     Allergies:  Allergies  Allergen Reactions  . Bee Venom Anaphylaxis  . Penicillins Itching and Swelling    Pt has tolerated cephalosporins in the past Has patient had a PCN reaction causing immediate rash, facial/tongue/throat swelling, SOB or lightheadedness with hypotension: unknown Has patient had a PCN reaction causing severe rash involving mucus membranes or skin necrosis: unknown Has patient had a PCN reaction that required hospitalization: unknown Has patient had a PCN reaction occurring within the last 10 years: unknown If all of the above answers are "NO", then may proceed with Cephalosporin [place]   Past Medical History:  Past Medical History:  Diagnosis Date  . Adenocarcinoma of left lung, stage 3 ([place]) 05/10/2017  . Aortic atherosclerosis ([facility]) 07/31/2017  . Asthma   . Chronic diastolic CHF (congestive heart failure) ([place])   . Chronic fatigue 08/02/2017  . Lung mass   . Non-small cell lung cancer ([place]) 11/13/2015  . Obesity   . Perforated bowel [facility])    Past Surgical History:  Past Surgical History:  Procedure Laterality Date  . CHOLECYSTECTOMY    . COLONOSCOPY N/A 07/31/2016   Procedure: COLONOSCOPY;  Surgeon: Danie Binder, MD;  [facility];  Service: Endoscopy;  Laterality: N/A;  1:45 pm  . ESOPHAGOGASTRODUODENOSCOPY N/A 07/31/2016   Procedure: ESOPHAGOGASTRODUODENOSCOPY (EGD);  Surgeon: Marga Melnick  Fields, MD;  Location: [facility];  Service: Endoscopy;  Laterality: N/A;  . EUS N/A 06/07/2017   Procedure: UPPER ENDOSCOPIC ULTRASOUND (EUS) LINEAR;  Surgeon: Milus Banister, MD;  Location: [facility];  Service: Endoscopy;  Laterality: N/A;  . HERNIA REPAIR    . IR FLUORO GUIDE PORT INSERTION RIGHT  07/11/2017  . IR US GUIDE VASC ACCESS RIGHT  07/11/2017   Social History:  Social History   Socioeconomic History  .  Marital status: Single    Spouse name: Not on file  . Number of children: Not on file  . Years of education: Not on file  . Highest education level: Not on file  Occupational History  . Not on file  Social Needs  . Financial resource strain: Not on file  . Food insecurity:    Worry: Not on file    Inability: Not on file  . Transportation needs:    Medical: Not on file    Non-medical: Not on file  Tobacco Use  . Smoking status: Former Smoker    Packs/day: 2.00    Years: 10.00    Pack years: 20.00    Types: Cigarettes    Last attempt to quit: 07/06/2012    Years since quitting: 5.6  . Smokeless tobacco: Former Systems developer    Types: [place] date: 07/06/2014  Substance and Sexual Activity  . Alcohol use: No  . Drug use: No  . Sexual activity: Never  Lifestyle  . Physical activity:    Days per week: Not on file    Minutes per session: Not on file  . Stress: Not on file  Relationships  . Social connections:    Talks on phone: Not on file    Gets together: Not on file    Attends religious service: Not on file    Active member of club or organization: Not on file    Attends meetings of clubs or organizations: Not on file    Relationship status: Not on file  . Intimate partner violence:    Fear of current or ex partner: Not on file    Emotionally abused: Not on file    Physically abused: Not on file    Forced sexual activity: Not on file  Other Topics Concern  . Not on file  Social History Narrative  . Not on file   Family History:  Family History  Problem Relation Age of Onset  . Diabetes Mother   . Hypertension Mother   . Asthma Father   . Prostate cancer Father   . Hypertension Father   . Colon cancer Neg Hx     Review of Systems: Constitutional: Denies fevers, chills or abnormal weight loss Eyes: Denies blurriness of vision Ears, nose, mouth, throat, and face: Denies mucositis or sore throat Respiratory: Denies cough, dyspnea or wheezes Cardiovascular: Denies  palpitation, chest discomfort or lower extremity swelling Gastrointestinal:  Denies nausea, constipation, diarrhea GU: Denies dysuria or incontinence Skin: Denies abnormal skin rashes Neurological: Per HPI Musculoskeletal: +joint pain Behavioral/Psych: Denies anxiety, disturbance in thought content, and mood instability   Physical Exam: Vitals:   03/04/18 1213  BP: 130/78  Pulse: (!) 109  Resp: 18  Temp: 98.1 F (36.7 C)  SpO2: 98%   KPS: 80. General: Alert, cooperative, pleasant, in no acute distress.  On supplemental O2. Head: Normal EENT: No conjunctival injection or scleral icterus. Oral mucosa moist Lungs: Resp effort normal Cardiac: Regular rate and rhythm Abdomen: Soft, non-distended  abdomen Skin: No rashes cyanosis or petechiae. Extremities: No clubbing or edema  Neurologic Exam: Mental Status: Awake, alert, attentive to examiner. Oriented to self and environment. Language is fluent with intact comprehension.   Cranial Nerves: Visual acuity is grossly normal. Visual fields are full. Extra-ocular movements intact. No ptosis. Face is symmetric, tongue midline. Motor: Tone and bulk are normal. Power is full in both arms and legs, mild cogwheeling noted in right arm on passive flexion. Reflexes are symmetric, no pathologic reflexes present. Intact finger to nose bilaterally.  Poly-mini-myoclonus appreciated on fixed posturing, but no tremor  Sensory: Intact to light touch and temperature Gait: Independent   Labs: I have reviewed the data as listed    Component Value Date/Time   NA 138 02/27/2018 1330   NA 139 06/10/202018 1313   K 3.9 02/27/2018 1330   K 3.8 06/10/202018 1313   CL 101 02/27/2018 1330   CO2 31 (H) 02/27/2018 1330   CO2 27 06/10/202018 1313   GLUCOSE 81 02/27/2018 1330   GLUCOSE 89 06/10/202018 1313   BUN 9 02/27/2018 1330   BUN 9.8 06/10/202018 1313   CREATININE 1.00 02/27/2018 1330   CREATININE 0.9 06/10/202018 1313   CALCIUM 9.2 02/27/2018 1330    CALCIUM 8.6 06/10/202018 1313   PROT 7.8 02/27/2018 1330   PROT 6.7 06/10/202018 1313   ALBUMIN 3.3 (L) 02/27/2018 1330   ALBUMIN 2.7 (L) 06/10/202018 1313   AST 8 02/27/2018 1330   AST 9 06/10/202018 1313   ALT <6 02/27/2018 1330   ALT 6 06/10/202018 1313   ALKPHOS 80 02/27/2018 1330   ALKPHOS 57 06/10/202018 1313   BILITOT 0.4 02/27/2018 1330   BILITOT 0.23 06/10/202018 1313   GFRNONAA >60 02/27/2018 1330   GFRAA >60 02/27/2018 1330   Lab Results  Component Value Date   WBC 4.6 02/27/2018   NEUTROABS 2.9 02/27/2018   HGB 10.0 (L) 02/27/2018   HCT 33.4 (L) 02/27/2018   MCV 79.5 02/27/2018   PLT 225 02/27/2018    Assessment/Plan 1. Tremor  James Zamora previously demonstrated mild parkinsonian features which are less appreciated on exam today. He also feels subjectively improved.   No need for dopaminergic therapy at this time.    If symptoms worsen or evolve, he will contact us for further evaluation.  Otherwise, we will re-examine him in 6 months.  We appreciate the opportunity to participate in the care of James Zamora.   All questions were answered. The patient knows to call the clinic with any problems, questions or concerns. No barriers to learning were detected.  The total time spent in the encounter was 25 minutes and more than 50% was on counseling and review of test results   Ventura Sellers, MD Medical Director of Neuro-Oncology [facility] at [place] 03/04/18 12:15 PM

## 2018-03-04 NOTE — Telephone Encounter (Signed)
Per 5/6 los - gave pt avs and calendar.

## 2018-03-05 DIAGNOSIS — C3492 Malignant neoplasm of unspecified part of left bronchus or lung: Secondary | ICD-10-CM | POA: Diagnosis not present

## 2018-03-05 DIAGNOSIS — M6281 Muscle weakness (generalized): Secondary | ICD-10-CM | POA: Diagnosis not present

## 2018-03-05 DIAGNOSIS — J449 Chronic obstructive pulmonary disease, unspecified: Secondary | ICD-10-CM | POA: Diagnosis not present

## 2018-03-05 DIAGNOSIS — Z87891 Personal history of nicotine dependence: Secondary | ICD-10-CM | POA: Diagnosis not present

## 2018-03-05 DIAGNOSIS — I110 Hypertensive heart disease with heart failure: Secondary | ICD-10-CM | POA: Diagnosis not present

## 2018-03-05 DIAGNOSIS — I5033 Acute on chronic diastolic (congestive) heart failure: Secondary | ICD-10-CM | POA: Diagnosis not present

## 2018-03-12 DIAGNOSIS — C3492 Malignant neoplasm of unspecified part of left bronchus or lung: Secondary | ICD-10-CM | POA: Diagnosis not present

## 2018-03-12 DIAGNOSIS — Z87891 Personal history of nicotine dependence: Secondary | ICD-10-CM | POA: Diagnosis not present

## 2018-03-12 DIAGNOSIS — I5033 Acute on chronic diastolic (congestive) heart failure: Secondary | ICD-10-CM | POA: Diagnosis not present

## 2018-03-12 DIAGNOSIS — I110 Hypertensive heart disease with heart failure: Secondary | ICD-10-CM | POA: Diagnosis not present

## 2018-03-12 DIAGNOSIS — J449 Chronic obstructive pulmonary disease, unspecified: Secondary | ICD-10-CM | POA: Diagnosis not present

## 2018-03-12 DIAGNOSIS — M6281 Muscle weakness (generalized): Secondary | ICD-10-CM | POA: Diagnosis not present

## 2018-03-13 ENCOUNTER — Inpatient Hospital Stay: Payer: Medicare Other

## 2018-03-13 ENCOUNTER — Telehealth: Payer: Self-pay | Admitting: Internal Medicine

## 2018-03-13 ENCOUNTER — Inpatient Hospital Stay (HOSPITAL_BASED_OUTPATIENT_CLINIC_OR_DEPARTMENT_OTHER): Payer: Medicare Other | Admitting: Oncology

## 2018-03-13 ENCOUNTER — Encounter: Payer: Self-pay | Admitting: Oncology

## 2018-03-13 VITALS — BP 132/79 | HR 116 | Temp 97.6°F | Resp 17 | Ht 66.0 in | Wt 202.0 lb

## 2018-03-13 DIAGNOSIS — I2692 Saddle embolus of pulmonary artery without acute cor pulmonale: Secondary | ICD-10-CM

## 2018-03-13 DIAGNOSIS — Z5112 Encounter for antineoplastic immunotherapy: Secondary | ICD-10-CM | POA: Diagnosis not present

## 2018-03-13 DIAGNOSIS — Z79899 Other long term (current) drug therapy: Secondary | ICD-10-CM | POA: Diagnosis not present

## 2018-03-13 DIAGNOSIS — C3432 Malignant neoplasm of lower lobe, left bronchus or lung: Secondary | ICD-10-CM

## 2018-03-13 DIAGNOSIS — C3492 Malignant neoplasm of unspecified part of left bronchus or lung: Secondary | ICD-10-CM

## 2018-03-13 DIAGNOSIS — I509 Heart failure, unspecified: Secondary | ICD-10-CM | POA: Diagnosis not present

## 2018-03-13 DIAGNOSIS — Z7901 Long term (current) use of anticoagulants: Secondary | ICD-10-CM

## 2018-03-13 DIAGNOSIS — R251 Tremor, unspecified: Secondary | ICD-10-CM | POA: Diagnosis not present

## 2018-03-13 DIAGNOSIS — Z87891 Personal history of nicotine dependence: Secondary | ICD-10-CM | POA: Diagnosis not present

## 2018-03-13 DIAGNOSIS — I2699 Other pulmonary embolism without acute cor pulmonale: Secondary | ICD-10-CM | POA: Diagnosis not present

## 2018-03-13 LAB — CBC WITH DIFFERENTIAL/PLATELET
BASOS PCT: 0 %
Basophils Absolute: 0 10*3/uL (ref 0.0–0.1)
EOS ABS: 0.2 10*3/uL (ref 0.0–0.5)
EOS PCT: 4 %
HCT: 35.7 % — ABNORMAL LOW (ref 38.4–49.9)
Hemoglobin: 10.8 g/dL — ABNORMAL LOW (ref 13.0–17.1)
LYMPHS ABS: 1 10*3/uL (ref 0.9–3.3)
Lymphocytes Relative: 20 %
MCH: 24.1 pg — AB (ref 27.2–33.4)
MCHC: 30.3 g/dL — AB (ref 32.0–36.0)
MCV: 79.7 fL (ref 79.3–98.0)
MONOS PCT: 5 %
Monocytes Absolute: 0.3 10*3/uL (ref 0.1–0.9)
Neutro Abs: 3.5 10*3/uL (ref 1.5–6.5)
Neutrophils Relative %: 71 %
PLATELETS: 223 10*3/uL (ref 140–400)
RBC: 4.48 MIL/uL (ref 4.20–5.82)
RDW: 17 % — AB (ref 11.0–14.6)
WBC: 4.9 10*3/uL (ref 4.0–10.3)

## 2018-03-13 LAB — COMPREHENSIVE METABOLIC PANEL
ALT: <6 U/L (ref 0–55)
ANION GAP: 7 (ref 3–11)
AST: 8 U/L (ref 5–34)
Albumin: 3.4 g/dL — ABNORMAL LOW (ref 3.5–5.0)
Alkaline Phosphatase: 87 U/L (ref 40–150)
BUN: 9 mg/dL (ref 7–26)
CHLORIDE: 101 mmol/L (ref 98–109)
CO2: 30 mmol/L — AB (ref 22–29)
Calcium: 9.2 mg/dL (ref 8.4–10.4)
Creatinine, Ser: 1.05 mg/dL (ref 0.70–1.30)
GFR calc non Af Amer: >60 mL/min (ref >60)
Glucose, Bld: 92 mg/dL (ref 70–140)
POTASSIUM: 3.7 mmol/L (ref 3.5–5.1)
SODIUM: 138 mmol/L (ref 136–145)
Total Bilirubin: 0.2 mg/dL (ref 0.2–1.2)
Total Protein: 8.1 g/dL (ref 6.4–8.3)

## 2018-03-13 MED ORDER — SODIUM CHLORIDE 0.9% FLUSH
10.0000 mL | INTRAVENOUS | Status: DC | PRN
  Administered 2018-03-13: 10 mL
  Filled 2018-03-13: qty 10

## 2018-03-13 MED ORDER — HEPARIN SOD (PORK) LOCK FLUSH 100 UNIT/ML IV SOLN
500.0000 [IU] | Freq: Once | INTRAVENOUS | Status: AC | PRN
  Administered 2018-03-13: 500 [IU]
  Filled 2018-03-13: qty 5

## 2018-03-13 MED ORDER — SODIUM CHLORIDE 0.9 % IV SOLN
Freq: Once | INTRAVENOUS | Status: AC
  Administered 2018-03-13: 12:00:00 via INTRAVENOUS

## 2018-03-13 MED ORDER — SODIUM CHLORIDE 0.9% FLUSH
10.0000 mL | Freq: Once | INTRAVENOUS | Status: AC
  Administered 2018-03-13: 10 mL
  Filled 2018-03-13: qty 10

## 2018-03-13 MED ORDER — RIVAROXABAN 20 MG PO TABS: 20.0000 mg | ORAL_TABLET | Freq: Every day | ORAL | 2 refills | Status: DC

## 2018-03-13 MED ORDER — SODIUM CHLORIDE 0.9 % IV SOLN
9.8000 mg/kg | Freq: Once | INTRAVENOUS | Status: AC
  Administered 2018-03-13: 860 mg via INTRAVENOUS
  Filled 2018-03-13: qty 7.2

## 2018-03-13 NOTE — Telephone Encounter (Signed)
Appointments scheduled AVS/Calendar printed per 5/15 los

## 2018-03-13 NOTE — Assessment & Plan Note (Addendum)
This is a very pleasant 62 year old African-American male recently diagnosed with a stage IIIAnon-small cell lung cancer, adenocarcinoma presented with left lower lobe lung mass in addition to mediastinal lymphadenopathy.  The patient underwent a course of concurrent chemoradiation with weekly carboplatin and paclitaxel status post 7 cycles and tolerated his treatment fairly well. He is currently undergoing treatment with consolidation immunotherapy with Imfinzi (Durvalumab) status post 15cycles.  The patient continues to tolerate this treatment well with no concerning complaints. Recommend for the patient to proceed with cycle 16 of his treatment as scheduled today.  He will come back for follow-up visit in 2 weeks for evaluation before starting cycle #17.  For his pulmonary embolism, he will continue Xarelto.  Refill was provided today.  The patient was advised to call immediately if he has any concerning symptoms in the interval. The patient voices understanding of current disease status and treatment options and is in agreement with the current care plan. All questions were answered. The patient knows to call the clinic with any problems, questions or concerns. We can certainly see the patient much sooner if necessary.

## 2018-03-13 NOTE — Progress Notes (Signed)
[facility] OFFICE PROGRESS NOTE  James Beard, MD [address] [place] [address]  DIAGNOSIS: Stage IIIA (T2a, N2, M0) non-small cell lung cancer, adenocarcinoma presented with left lower lobe lung mass in addition to mediastinal lymphadenopathy diagnosed in June 2018. The patient also has suspicious soft tissue density in the pancreatic head but this has been present for 3 years on previous CT scan and unlikely to be related to [name], last dose was given 07/03/2017.  CURRENT THERAPY: Consolidation treatment with immunotherapy with Imfinzi (Durvalumab) 10 MG/KG every 2 weeks, first dose 08/15/2017. Status post 15cycles.  INTERVAL HISTORY: James Zamora 62 y.o. male returns for routine follow-up visit accompanied by his sister.  The patient is feeling fine today and has no specific complaints.  The patient denies fevers and chills.  Denies chest pain, cough, hemoptysis.  He has baseline shortness of breath and wears home oxygen.  Denies nausea, vomiting, constipation, diarrhea.  He has ongoing lower extremity edema and remains on Lasix.  Patient is here for evaluation prior to cycle #16 of his treatment.  MEDICAL HISTORY: Past Medical History:  Diagnosis Date  . Adenocarcinoma of left lung, stage 3 ([place]) 05/10/2017  . Aortic atherosclerosis ([place]) 07/31/2017  . Asthma   . Chronic diastolic CHF (congestive heart failure) ([place])   . Chronic fatigue 08/02/2017  . Lung mass   . Non-small cell lung cancer ([place]) 11/13/2015  . Obesity   . Perforated bowel ([place])     ALLERGIES:  is allergic to bee venom and penicillins.  MEDICATIONS:  Current Outpatient Medications  Medication Sig Dispense Refill  . aspirin EC 81 MG EC tablet Take 1 tablet (81 mg total) by mouth daily. 30 tablet 1  . fluticasone  furoate-vilanterol (BREO ELLIPTA) 200-25 MCG/INH AEPB Inhale 1 puff into the lungs daily. (Patient not taking: Reported on 03/04/2018) 1 each 5  . furosemide (LASIX) 80 MG tablet Take 1 tablet (80 mg total) by mouth daily. 90 tablet 3  . lidocaine-prilocaine (EMLA) cream Apply 1 application topically as needed. Apply small amount to port site 1-1.5 hours before treatment. Cover with plastic wrap. 30 g 0  . OXYGEN Inhale 4 L into the lungs continuous.     . potassium chloride SA (K-DUR,KLOR-CON) 20 MEQ tablet Take 2 tablets (40 mEq total) by mouth daily. 180 tablet 3  . rivaroxaban (XARELTO) 20 MG TABS tablet Take 1 tablet (20 mg total) by mouth daily with supper. Begin 20 mg daily after starter pack is complete. 30 tablet 2   No current facility-administered medications for this visit.    Facility-Administered Medications Ordered in Other Visits  Medication Dose Route Frequency Provider Last Rate Last Dose  . sodium chloride flush (NS) 0.9 % injection 10 mL  10 mL Intracatheter PRN Curt Bears, MD   10 mL at 03/13/18 1343    SURGICAL HISTORY:  Past Surgical History:  Procedure Laterality Date  . CHOLECYSTECTOMY    . COLONOSCOPY N/A 07/31/2016   Procedure: COLONOSCOPY;  Surgeon: Danie Binder, MD;  [facility];  Service: Endoscopy;  Laterality: N/A;  1:45 pm  . ESOPHAGOGASTRODUODENOSCOPY N/A 07/31/2016   Procedure: ESOPHAGOGASTRODUODENOSCOPY (EGD);  Surgeon: Danie Binder, MD;  [facility];  Service: Endoscopy;  Laterality: N/A;  . EUS N/A 06/07/2017   Procedure: UPPER  ENDOSCOPIC ULTRASOUND (EUS) LINEAR;  Surgeon: Milus Banister, MD;  [facility];  Service: Endoscopy;  Laterality: N/A;  . HERNIA REPAIR    . IR FLUORO GUIDE PORT INSERTION RIGHT  07/11/2017  . IR US GUIDE VASC ACCESS RIGHT  07/11/2017    REVIEW OF SYSTEMS:   Review of Systems  Constitutional: Negative for appetite change, chills, fatigue, fever and unexpected weight change.  HENT:    Negative for mouth sores, nosebleeds, sore throat and trouble swallowing.   Eyes: Negative for eye problems and icterus.  Respiratory: Negative for cough, hemoptysis, and wheezing.  The patient has baseline shortness of breath and wears home oxygen.  Cardiovascular: Negative for chest pain.  Positive for lower extremity edema.  Gastrointestinal: Negative for abdominal pain, constipation, diarrhea, nausea and vomiting.  Genitourinary: Negative for bladder incontinence, difficulty urinating, dysuria, frequency and hematuria.   Musculoskeletal: Negative for back pain, gait problem, neck pain and neck stiffness.  Skin: Negative for itching and rash.  Neurological: Negative for dizziness, extremity weakness, gait problem, headaches, light-headedness and seizures.  Hematological: Negative for adenopathy. Does not bruise/bleed easily.  Psychiatric/Behavioral: Negative for confusion, depression and sleep disturbance. The patient is not nervous/anxious.     PHYSICAL EXAMINATION:  Blood pressure 132/79, pulse (!) 116, temperature 97.6 F (36.4 C), temperature source Oral, resp. rate 17, height 5\' 6"  (1.676 m), weight 202 lb (91.6 kg), SpO2 92 %.  ECOG PERFORMANCE STATUS: 1 - Symptomatic but completely ambulatory  Physical Exam  Constitutional: Oriented to person, place, and time and well-developed, well-nourished, and in no distress. No distress.  HENT:  Head: Normocephalic and atraumatic.  Mouth/Throat: Oropharynx is clear and moist. No oropharyngeal exudate.  Eyes: Conjunctivae are normal. Right eye exhibits no discharge. Left eye exhibits no discharge. No scleral icterus.  Neck: Normal range of motion. Neck supple.  Cardiovascular: Normal rate, regular rhythm, normal heart sounds and intact distal pulses.  2+ lower extremity edema.  Pulmonary/Chest: Effort normal and breath sounds normal. No respiratory distress. No wheezes. No rales.  Abdominal: Soft. Bowel sounds are normal. Exhibits no  distension and no mass. There is no tenderness.  Musculoskeletal: Normal range of motion. Exhibits no edema.  Lymphadenopathy:    No cervical adenopathy.  Neurological: Alert and oriented to person, place, and time. Exhibits normal muscle tone. Gait normal. Coordination normal.  Skin: Skin is warm and dry. No rash noted. Not diaphoretic. No erythema. No pallor.  Psychiatric: Mood, memory and judgment normal.  Vitals reviewed.  LABORATORY DATA: Lab Results  Component Value Date   WBC 4.9 03/13/2018   HGB 10.8 (L) 03/13/2018   HCT 35.7 (L) 03/13/2018   MCV 79.7 03/13/2018   PLT 223 03/13/2018      Chemistry      Component Value Date/Time   NA 138 03/13/2018 1017   NA 139 2020/05/517 1313   K 3.7 03/13/2018 1017   K 3.8 2020/05/517 1313   CL 101 03/13/2018 1017   CO2 30 (H) 03/13/2018 1017   CO2 27 2020/05/517 1313   BUN 9 03/13/2018 1017   BUN 9.8 2020/05/517 1313   CREATININE 1.05 03/13/2018 1017   CREATININE 0.9 2020/05/517 1313      Component Value Date/Time   CALCIUM 9.2 03/13/2018 1017   CALCIUM 8.6 2020/05/517 1313   ALKPHOS 87 03/13/2018 1017   ALKPHOS 57 2020/05/517 1313   AST 8 03/13/2018 1017   AST 9 2020/05/517 1313   ALT <6 03/13/2018 1017   ALT 6 2020/05/517  1313   BILITOT 0.2 03/13/2018 1017   BILITOT 0.23 2020/10/1816 1313       RADIOGRAPHIC STUDIES:  No results found.   ASSESSMENT/PLAN:  Adenocarcinoma of left lung, stage 3 ([facility]) This is a very pleasant 62 year old African-American male recently diagnosed with a stage IIIAnon-small cell lung cancer, adenocarcinoma presented with left lower lobe lung mass in addition to mediastinal lymphadenopathy.  The patient underwent a course of concurrent chemoradiation with weekly carboplatin and paclitaxel status post 7 cycles and tolerated his treatment fairly well. He is currently undergoing treatment with consolidation immunotherapy with Imfinzi (Durvalumab) status post 15cycles.  The patient continues to  tolerate this treatment well with no concerning complaints. Recommend for the patient to proceed with cycle 16 of his treatment as scheduled today.  He will come back for follow-up visit in 2 weeks for evaluation before starting cycle #17.  For his pulmonary embolism, he will continue Xarelto.  Refill was provided today.  The patient was advised to call immediately if he has any concerning symptoms in the interval. The patient voices understanding of current disease status and treatment options and is in agreement with the current care plan. All questions were answered. The patient knows to call the clinic with any problems, questions or concerns. We can certainly see the patient much sooner if necessary.   No orders of the defined types were placed in this encounter.  Mikey Bussing, DNP, AGPCNP-BC, AOCNP 03/13/18

## 2018-03-13 NOTE — Patient Instructions (Signed)
[facility] Discharge Instructions for Patients Receiving Chemotherapy  Today you received the following chemotherapy agents: Imfinzi.  To help prevent nausea and vomiting after your treatment, we encourage you to take your nausea medication as directed.   If you develop nausea and vomiting that is not controlled by your nausea medication, call the clinic.   BELOW ARE SYMPTOMS THAT SHOULD BE REPORTED IMMEDIATELY:  *FEVER GREATER THAN 100.5 F  *CHILLS WITH OR WITHOUT FEVER  NAUSEA AND VOMITING THAT IS NOT CONTROLLED WITH YOUR NAUSEA MEDICATION  *UNUSUAL SHORTNESS OF BREATH  *UNUSUAL BRUISING OR BLEEDING  TENDERNESS IN MOUTH AND THROAT WITH OR WITHOUT PRESENCE OF ULCERS  *URINARY PROBLEMS  *BOWEL PROBLEMS  UNUSUAL RASH Items with * indicate a potential emergency and should be followed up as soon as possible.  Feel free to call the clinic should you have any questions or concerns. The clinic phone number is [phone].  Please show the CHEMO ALERT CARD at check-in to the Emergency Department and triage nurse.   

## 2018-03-19 DIAGNOSIS — M6281 Muscle weakness (generalized): Secondary | ICD-10-CM | POA: Diagnosis not present

## 2018-03-19 DIAGNOSIS — I5033 Acute on chronic diastolic (congestive) heart failure: Secondary | ICD-10-CM | POA: Diagnosis not present

## 2018-03-19 DIAGNOSIS — Z87891 Personal history of nicotine dependence: Secondary | ICD-10-CM | POA: Diagnosis not present

## 2018-03-19 DIAGNOSIS — J449 Chronic obstructive pulmonary disease, unspecified: Secondary | ICD-10-CM | POA: Diagnosis not present

## 2018-03-19 DIAGNOSIS — C3492 Malignant neoplasm of unspecified part of left bronchus or lung: Secondary | ICD-10-CM | POA: Diagnosis not present

## 2018-03-19 DIAGNOSIS — I110 Hypertensive heart disease with heart failure: Secondary | ICD-10-CM | POA: Diagnosis not present

## 2018-03-26 ENCOUNTER — Ambulatory Visit: Payer: Medicare Other | Admitting: Student

## 2018-03-27 ENCOUNTER — Inpatient Hospital Stay: Payer: Medicare Other

## 2018-03-27 ENCOUNTER — Encounter: Payer: Self-pay | Admitting: Internal Medicine

## 2018-03-27 ENCOUNTER — Telehealth: Payer: Self-pay | Admitting: Internal Medicine

## 2018-03-27 ENCOUNTER — Inpatient Hospital Stay (HOSPITAL_BASED_OUTPATIENT_CLINIC_OR_DEPARTMENT_OTHER): Payer: Medicare Other | Admitting: Internal Medicine

## 2018-03-27 VITALS — BP 115/75 | HR 98 | Temp 98.5°F | Resp 18 | Ht 66.0 in | Wt 199.3 lb

## 2018-03-27 DIAGNOSIS — R5382 Chronic fatigue, unspecified: Secondary | ICD-10-CM

## 2018-03-27 DIAGNOSIS — Z7901 Long term (current) use of anticoagulants: Secondary | ICD-10-CM | POA: Diagnosis not present

## 2018-03-27 DIAGNOSIS — C3432 Malignant neoplasm of lower lobe, left bronchus or lung: Secondary | ICD-10-CM

## 2018-03-27 DIAGNOSIS — Z79899 Other long term (current) drug therapy: Secondary | ICD-10-CM | POA: Diagnosis not present

## 2018-03-27 DIAGNOSIS — I509 Heart failure, unspecified: Secondary | ICD-10-CM | POA: Diagnosis not present

## 2018-03-27 DIAGNOSIS — Z5112 Encounter for antineoplastic immunotherapy: Secondary | ICD-10-CM | POA: Diagnosis not present

## 2018-03-27 DIAGNOSIS — I2699 Other pulmonary embolism without acute cor pulmonale: Secondary | ICD-10-CM

## 2018-03-27 DIAGNOSIS — C3492 Malignant neoplasm of unspecified part of left bronchus or lung: Secondary | ICD-10-CM

## 2018-03-27 DIAGNOSIS — R251 Tremor, unspecified: Secondary | ICD-10-CM | POA: Diagnosis not present

## 2018-03-27 DIAGNOSIS — Z87891 Personal history of nicotine dependence: Secondary | ICD-10-CM | POA: Diagnosis not present

## 2018-03-27 LAB — CBC WITH DIFFERENTIAL/PLATELET
BASOS ABS: 0 10*3/uL (ref 0.0–0.1)
Basophils Relative: 0 %
Eosinophils Absolute: 0.2 10*3/uL (ref 0.0–0.5)
Eosinophils Relative: 3 %
HEMATOCRIT: 35.1 % — AB (ref 38.4–49.9)
HEMOGLOBIN: 10.5 g/dL — AB (ref 13.0–17.1)
LYMPHS PCT: 17 %
Lymphs Abs: 1 10*3/uL (ref 0.9–3.3)
MCH: 23.8 pg — ABNORMAL LOW (ref 27.2–33.4)
MCHC: 29.9 g/dL — ABNORMAL LOW (ref 32.0–36.0)
MCV: 79.4 fL (ref 79.3–98.0)
MONO ABS: 0.5 10*3/uL (ref 0.1–0.9)
Monocytes Relative: 8 %
NEUTROS ABS: 4.3 10*3/uL (ref 1.5–6.5)
NEUTROS PCT: 72 %
Platelets: 208 10*3/uL (ref 140–400)
RBC: 4.42 MIL/uL (ref 4.20–5.82)
RDW: 16.9 % — ABNORMAL HIGH (ref 11.0–14.6)
WBC: 5.9 10*3/uL (ref 4.0–10.3)

## 2018-03-27 LAB — COMPREHENSIVE METABOLIC PANEL
ALBUMIN: 3.4 g/dL — AB (ref 3.5–5.0)
ALT: <6 U/L (ref 0–55)
ANION GAP: 9 (ref 3–11)
AST: 10 U/L (ref 5–34)
Alkaline Phosphatase: 82 U/L (ref 40–150)
BUN: 15 mg/dL (ref 7–26)
CO2: 28 mmol/L (ref 22–29)
Calcium: 9.2 mg/dL (ref 8.4–10.4)
Chloride: 102 mmol/L (ref 98–109)
Creatinine, Ser: 1.07 mg/dL (ref 0.70–1.30)
GFR calc non Af Amer: >60 mL/min (ref >60)
GLUCOSE: 92 mg/dL (ref 70–140)
POTASSIUM: 4.2 mmol/L (ref 3.5–5.1)
Sodium: 139 mmol/L (ref 136–145)
Total Bilirubin: 0.2 mg/dL (ref 0.2–1.2)
Total Protein: 8.3 g/dL (ref 6.4–8.3)

## 2018-03-27 LAB — TSH: TSH: 0.633 u[IU]/mL (ref 0.320–4.118)

## 2018-03-27 MED ORDER — SODIUM CHLORIDE 0.9% FLUSH
10.0000 mL | Freq: Once | INTRAVENOUS | Status: AC
  Administered 2018-03-27: 10 mL
  Filled 2018-03-27: qty 10

## 2018-03-27 MED ORDER — HEPARIN SOD (PORK) LOCK FLUSH 100 UNIT/ML IV SOLN
500.0000 [IU] | Freq: Once | INTRAVENOUS | Status: AC | PRN
  Administered 2018-03-27: 500 [IU]
  Filled 2018-03-27: qty 5

## 2018-03-27 MED ORDER — SODIUM CHLORIDE 0.9% FLUSH
10.0000 mL | INTRAVENOUS | Status: DC | PRN
  Administered 2018-03-27: 10 mL
  Filled 2018-03-27: qty 10

## 2018-03-27 MED ORDER — SODIUM CHLORIDE 0.9 % IV SOLN
Freq: Once | INTRAVENOUS | Status: AC
  Administered 2018-03-27: 12:00:00 via INTRAVENOUS

## 2018-03-27 MED ORDER — SODIUM CHLORIDE 0.9 % IV SOLN
9.8000 mg/kg | Freq: Once | INTRAVENOUS | Status: AC
  Administered 2018-03-27: 860 mg via INTRAVENOUS
  Filled 2018-03-27: qty 10

## 2018-03-27 NOTE — Progress Notes (Signed)
Cardiology Office Note    Date:  03/28/2018   ID:  James, Zamora 12/02/1955, MRN [id number]  PCP:  [name], MD  Cardiologist: [name], MD    Chief Complaint  Patient presents with  . Follow-up    4 week visit    History of Present Illness:    James Zamora is a 62 y.o. male with past medical history of chronic diastolic CHF, HTN, COPD (on 3-4L [place] at baseline), history of PE (on Xarelto) and NSCLC who presents to the office today for one-month follow-up.  He was last examined by myself on 02/20/2018 and most history was provided by his sister who reports that he slept throughout a majority of the day. He denied any specific chest pain or dyspnea on exertion at that time. He did have 2+ pitting edema on examination, therefore Lasix was further titrated to 80 mg daily. Repeat labs on 02/27/2018 showed creatinine remained stable at 1.00 and K+ was at 3.9.  In talking with the patient and his sister today, he reports overall doing well from a cardiac perspective since his last office visit. He has noticed some improvement in his lower extremity edema and says that weight has overall been stable on his home scales. He denies any specific orthopnea, PND, chest pain, or palpitations. Has chronic dyspnea on exertion and denies any acute changes in his symptoms. He does not follow a low-sodium diet and reports consuming soups on a regular basis. He had previously been informed to elevate his lower extremities but says it is uncomfortable to elevate his legs. Not interested in compression stockings.   Past Medical History:  Diagnosis Date  . Adenocarcinoma of left lung, stage 3 ([place]) 05/10/2017  . Aortic atherosclerosis ([place]) 07/31/2017  . Asthma   . Chronic diastolic CHF (congestive heart failure) ([place])    a. 07/2017: echo showing EF of 65-70% with Grade 1 DD  . Chronic fatigue 08/02/2017  . Lung mass   . Non-small cell lung cancer ([place]) 11/13/2015  . Obesity   . Perforated  bowel [facility])     Past Surgical History:  Procedure Laterality Date  . CHOLECYSTECTOMY    . COLONOSCOPY N/A 07/31/2016   Procedure: COLONOSCOPY;  Surgeon: Danie Binder, MD;  [facility];  Service: Endoscopy;  Laterality: N/A;  1:45 pm  . ESOPHAGOGASTRODUODENOSCOPY N/A 07/31/2016   Procedure: ESOPHAGOGASTRODUODENOSCOPY (EGD);  Surgeon: Danie Binder, MD;  Location: [facility] ENDO SU[facility];  Service: Endoscopy;  Laterality: N/A;  . EUS N/A 06/07/2017   Procedure: UPPER ENDOSCOPIC ULTRASOUND (EUS) LINEAR;  Surgeon: Milus Banister, MD;  Location: [facility] END[facility];  Service: Endoscopy;  Laterality: N/A;  . HERNIA REPAIR    . IR FLUORO GUIDE PORT INSERTION RIGHT  07/11/2017  . IR US GUIDE VASC ACCESS RIGHT  07/11/2017    Current Medications: Outpatient Medications Prior to Visit  Medication Sig Dispense Refill  . aspirin EC 81 MG EC tablet Take 1 tablet (81 mg total) by mouth daily. 30 tablet 1  . fluticasone furoate-vilanterol (BREO ELLIPTA) 200-25 MCG/INH AEPB Inhale 1 puff into the lungs daily. 1 each 5  . furosemide (LASIX) 80 MG tablet Take 1 tablet (80 mg total) by mouth daily. 90 tablet 3  . lidocaine-prilocaine (EMLA) cream Apply 1 application topically as needed. Apply small amount to port site 1-1.5 hours before treatment. Cover with plastic wrap. 30 g 0  . OXYGEN Inhale 4 L into the lungs continuous.     Marland Kitchen  potassium chloride SA (K-DUR,KLOR-CON) 20 MEQ tablet Take 2 tablets (40 mEq total) by mouth daily. 180 tablet 3  . rivaroxaban (XARELTO) 20 MG TABS tablet Take 1 tablet (20 mg total) by mouth daily with supper. Begin 20 mg daily after starter pack is complete. 30 tablet 2   No facility-administered medications prior to visit.      Allergies:   Bee venom and Penicillins   Social History   Socioeconomic History  . Marital status: Single    Spouse name: Not on file  . Number of children: Not on file  . Years of education: Not on file  . Highest education level: Not on file    Occupational History  . Not on file  Social Needs  . Financial resource strain: Not on file  . Food insecurity:    Worry: Not on file    Inability: Not on file  . Transportation needs:    Medical: Not on file    Non-medical: Not on file  Tobacco Use  . Smoking status: Former Smoker    Packs/day: 2.00    Years: 10.00    Pack years: 20.00    Types: Cigarettes    Last attempt to quit: 07/06/2012    Years since quitting: 5.7  . Smokeless tobacco: Former Systems developer    Types: [place] date: 07/06/2014  Substance and Sexual Activity  . Alcohol use: No  . Drug use: No  . Sexual activity: Never  Lifestyle  . Physical activity:    Days per week: Not on file    Minutes per session: Not on file  . Stress: Not on file  Relationships  . Social connections:    Talks on phone: Not on file    Gets together: Not on file    Attends religious service: Not on file    Active member of club or organization: Not on file    Attends meetings of clubs or organizations: Not on file    Relationship status: Not on file  Other Topics Concern  . Not on file  Social History Narrative  . Not on file     Family History:  The patient's family history includes Asthma in his father; Diabetes in his mother; Hypertension in his father and mother; Prostate cancer in his father.   Review of Systems:   Please see the history of present illness.     General:  No chills, fever, night sweats or weight changes.  Cardiovascular:  No chest pain, orthopnea, palpitations, paroxysmal nocturnal dyspnea. Positive for edema and dyspnea on exertion.  Dermatological: No rash, lesions/masses Respiratory: No cough, dyspnea Urologic: No hematuria, dysuria Abdominal:   No nausea, vomiting, diarrhea, bright red blood per rectum, melena, or hematemesis Neurologic:  No visual changes, wkns, changes in mental status. All other systems reviewed and are otherwise negative except as noted above.   Physical Exam:    VS:  BP  106/68   Pulse 100   Ht 5\' 6"  (1.676 m)   Wt 202 lb 9.6 oz (91.9 kg)   SpO2 90%   BMI 32.70 kg/m    General: Well developed, well nourished [place] American male appearing in no acute distress. Head: Normocephalic, atraumatic, sclera non-icteric, no xanthomas, nares are without discharge.  Neck: No carotid bruits. JVD not elevated.  Lungs: Respirations regular and unlabored, without wheezes or rales. On 3L [place].  Heart: Regular rate and rhythm. No S3 or S4.  No murmur, no rubs, or gallops appreciated.  Abdomen: Soft, non-tender, non-distended with normoactive bowel sounds. No hepatomegaly. No rebound/guarding. No obvious abdominal masses. Msk:  Strength and tone appear normal for age. No joint deformities or effusions. Extremities: No clubbing or cyanosis. Trace edema along RLE, 1+ along LLE.  Distal pedal pulses are 2+ bilaterally. Neuro: Alert and oriented X 3. Moves all extremities spontaneously. No focal deficits noted. Psych:  Responds to questions appropriately with a normal affect. Skin: No rashes or lesions noted  Wt Readings from Last 3 Encounters:  03/28/18 202 lb 9.6 oz (91.9 kg)  03/27/18 199 lb 4.8 oz (90.4 kg)  03/13/18 202 lb (91.6 kg)     Studies/Labs Reviewed:   EKG:  EKG is not ordered today.    Recent Labs: 07/30/2017: Magnesium 1.6 03/27/2018: ALT <6; BUN 15; Creatinine, Ser 1.07; Hemoglobin 10.5; Platelets 208; Potassium 4.2; Sodium 139; TSH 0.633   Lipid Panel No results found for: CHOL, TRIG, HDL, CHOLHDL, VLDL, LDLCALC, LDLDIRECT  Additional studies/ records that were reviewed today include:   Echocardiogram: 08/01/2017 Study Conclusions  - Left ventricle: The cavity size was normal. Wall thickness was   normal. Systolic function was vigorous. The estimated ejection   fraction was in the range of 65% to 70%. Wall motion was normal;   there were no regional wall motion abnormalities. Doppler   parameters are consistent with abnormal left ventricular    relaxation (grade 1 diastolic dysfunction).  Assessment:    1. Chronic diastolic CHF (congestive heart failure) ([place])   2. Bilateral lower extremity edema   3. Chronic obstructive pulmonary disease, unspecified COPD type ([place])   4. Non-small cell lung cancer, unspecified laterality ([place])   5. History of pulmonary embolus (PE)      Plan:   In order of problems listed above:  1. Chronic Diastolic CHF/ Lower Extremity Edema - the patient was evaluated several weeks ago for worsening lower extremity edema and Lasix was further titrated to 80 mg daily.  He has experienced improvement in his edema since medication titration but overall management is limited in the setting of his noncompliance with a low-sodium diet and elevation of his lower extremities. He refuses to use compression stockings as well. - He still has edema most notable along his left lower extremity on examination but this is overall improved.  Lungs are clear on examination. Labs on 5/29 showed a stable creatinine of 1.07 and K+ at 4.2. Would continue with Lasix at current dosing (prefer once daily dosing as he was previously skipping the evening dose when BID due to frequent urination).   2. COPD - remains on 3-4L [place] at baseline. Denies any acute changes in his respiratory status.   3. NSCLC - being followed by Dr. Julien Nordmann and currently undergoing chemotherapy. Due for repeat imaging next month by his sister's report.   4. History of PE - He remains on Xarelto for anticoagulation and denies any evidence of active bleeding. Hemoglobin stable at 10.5 when checked on 03/27/2018.   Medication Adjustments/Labs and Tests Ordered: Current medicines are reviewed at length with the patient today.  Concerns regarding medicines are outlined above.  Medication changes, Labs and Tests ordered today are listed in the Patient Instructions below. Patient Instructions  Medication Instructions:  Your physician recommends that you continue  on your current medications as directed. Please refer to the Current Medication list given to you today.   Labwork: NONE   Testing/Procedures: NONE   Follow-Up: Your physician wants you to follow-up in: 3 Months with  Dr. Theressa Stamps will receive a reminder letter in the mail two months in advance. If you don't receive a letter, please call our office to schedule the follow-up appointment.  Any Other Special Instructions Will Be Listed Below (If Applicable).  If you need a refill on your cardiac medications before your next appointment, please call your pharmacy.  Thank you for choosing [facility]!    Signed, Erma Heritage, PA-C  03/28/2018 1:44 PM    [facility] [address] [place], [place] [address] Phone: [phone]

## 2018-03-27 NOTE — Telephone Encounter (Signed)
appts already scheduled per 5/29 los.

## 2018-03-27 NOTE — Patient Instructions (Signed)
[facility] Discharge Instructions for Patients Receiving Chemotherapy  Today you received the following chemotherapy agents Imfinzi.  To help prevent nausea and vomiting after your treatment, we encourage you to take your nausea medication as prescribed.    If you develop nausea and vomiting that is not controlled by your nausea medication, call the clinic.   BELOW ARE SYMPTOMS THAT SHOULD BE REPORTED IMMEDIATELY:  *FEVER GREATER THAN 100.5 F  *CHILLS WITH OR WITHOUT FEVER  NAUSEA AND VOMITING THAT IS NOT CONTROLLED WITH YOUR NAUSEA MEDICATION  *UNUSUAL SHORTNESS OF BREATH  *UNUSUAL BRUISING OR BLEEDING  TENDERNESS IN MOUTH AND THROAT WITH OR WITHOUT PRESENCE OF ULCERS  *URINARY PROBLEMS  *BOWEL PROBLEMS  UNUSUAL RASH Items with * indicate a potential emergency and should be followed up as soon as possible.  Feel free to call the clinic should you have any questions or concerns. The clinic phone number is [phone].  Please show the [facility] at check-in to the Emergency Department and triage nurse.

## 2018-03-27 NOTE — Progress Notes (Signed)
[facility] Telephone:([phone]) [phone]   Fax:([phone]) [phone]  OFFICE PROGRESS NOTE  [name], MD [address] [place] [address]  DIAGNOSIS: Stage IIIA (T2a, N2, M0) non-small cell lung cancer, adenocarcinoma presented with left lower lobe lung mass in addition to mediastinal lymphadenopathy diagnosed in June 2018. The patient also has suspicious soft tissue density in the pancreatic head but this has been present for 3 years on previous CT scan and unlikely to be related to [name], last dose was given 07/03/2017.  CURRENT THERAPY: Consolidation treatment with immunotherapy with Imfinzi (Durvalumab) 10 MG/KG every 2 weeks, first dose 08/15/2017.  Status post 16 cycles.  INTERVAL HISTORY: James Zamora 62 y.o. male returns to the clinic today for follow-up visit accompanied by his sister.  The patient is feeling fine today with no specific complaints.  He continues to tolerate his treatment with immunotherapy fairly well.  He denied having any chest pain, shortness breath, cough or hemoptysis.  He denied having any fever or chills.  He has no nausea, vomiting, diarrhea or constipation.  He is here today for evaluation before starting cycle #17.   MEDICAL HISTORY: Past Medical History:  Diagnosis Date  . Adenocarcinoma of left lung, stage 3 ([place]) 05/10/2017  . Aortic atherosclerosis ([place]) 07/31/2017  . Asthma   . Chronic diastolic CHF (congestive heart failure) ([place])   . Chronic fatigue 08/02/2017  . Lung mass   . Non-small cell lung cancer ([place]) 11/13/2015  . Obesity   . Perforated bowel ([place])     ALLERGIES:  is allergic to bee venom and penicillins.  MEDICATIONS:  Current Outpatient Medications  Medication Sig Dispense Refill  . aspirin EC 81 MG EC tablet Take 1 tablet (81 mg total) by mouth  daily. 30 tablet 1  . fluticasone furoate-vilanterol (BREO ELLIPTA) 200-25 MCG/INH AEPB Inhale 1 puff into the lungs daily. (Patient not taking: Reported on 03/04/2018) 1 each 5  . furosemide (LASIX) 80 MG tablet Take 1 tablet (80 mg total) by mouth daily. 90 tablet 3  . lidocaine-prilocaine (EMLA) cream Apply 1 application topically as needed. Apply small amount to port site 1-1.5 hours before treatment. Cover with plastic wrap. 30 g 0  . OXYGEN Inhale 4 L into the lungs continuous.     . potassium chloride SA (K-DUR,KLOR-CON) 20 MEQ tablet Take 2 tablets (40 mEq total) by mouth daily. 180 tablet 3  . rivaroxaban (XARELTO) 20 MG TABS tablet Take 1 tablet (20 mg total) by mouth daily with supper. Begin 20 mg daily after starter pack is complete. 30 tablet 2   No current facility-administered medications for this visit.     SURGICAL HISTORY:  Past Surgical History:  Procedure Laterality Date  . CHOLECYSTECTOMY    . COLONOSCOPY N/A 07/31/2016   Procedure: COLONOSCOPY;  Surgeon: Danie Binder, MD;  [facility];  Service: Endoscopy;  Laterality: N/A;  1:45 pm  . ESOPHAGOGASTRODUODENOSCOPY N/A 07/31/2016   Procedure: ESOPHAGOGASTRODUODENOSCOPY (EGD);  Surgeon: Danie Binder, MD;  [facility];  Service: Endoscopy;  Laterality: N/A;  . EUS N/A 06/07/2017   Procedure: UPPER ENDOSCOPIC ULTRASOUND (EUS) LINEAR;  Surgeon: Milus Banister, MD;  [facility];  Service: Endoscopy;  Laterality: N/A;  . HERNIA REPAIR    . IR FLUORO GUIDE  PORT INSERTION RIGHT  07/11/2017  . IR US GUIDE VASC ACCESS RIGHT  07/11/2017    REVIEW OF SYSTEMS:  A comprehensive review of systems was negative except for: Respiratory: positive for dyspnea on exertion   PHYSICAL EXAMINATION: General appearance: alert, cooperative and no distress Head: Normocephalic, without obvious abnormality, atraumatic Neck: no adenopathy, no JVD, supple, symmetrical, trachea midline and thyroid not enlarged,  symmetric, no tenderness/mass/nodules Lymph nodes: Cervical, supraclavicular, and axillary nodes normal. Resp: clear to auscultation bilaterally Back: symmetric, no curvature. ROM normal. No CVA tenderness. Cardio: regular rate and rhythm, S1, S2 normal, no murmur, click, rub or gallop GI: soft, non-tender; bowel sounds normal; no masses,  no organomegaly Extremities: edema 2+  ECOG PERFORMANCE STATUS: 1 - Symptomatic but completely ambulatory  Blood pressure 115/75, pulse 98, temperature 98.5 F (36.9 C), temperature source Oral, resp. rate 18, height 5\' 6"  (1.676 m), weight 199 lb 4.8 oz (90.4 kg), SpO2 95 %.  LABORATORY DATA: Lab Results  Component Value Date   WBC 5.9 03/27/2018   HGB 10.5 (L) 03/27/2018   HCT 35.1 (L) 03/27/2018   MCV 79.4 03/27/2018   PLT 208 03/27/2018      Chemistry      Component Value Date/Time   NA 139 03/27/2018 0959   NA 139 27-Nov-202018 1313   K 4.2 03/27/2018 0959   K 3.8 27-Nov-202018 1313   CL 102 03/27/2018 0959   CO2 28 03/27/2018 0959   CO2 27 27-Nov-202018 1313   BUN 15 03/27/2018 0959   BUN 9.8 27-Nov-202018 1313   CREATININE 1.07 03/27/2018 0959   CREATININE 0.9 27-Nov-202018 1313      Component Value Date/Time   CALCIUM 9.2 03/27/2018 0959   CALCIUM 8.6 27-Nov-202018 1313   ALKPHOS 82 03/27/2018 0959   ALKPHOS 57 27-Nov-202018 1313   AST 10 03/27/2018 0959   AST 9 27-Nov-202018 1313   ALT <6 03/27/2018 0959   ALT 6 27-Nov-202018 1313   BILITOT 0.2 03/27/2018 0959   BILITOT 0.23 27-Nov-202018 1313       RADIOGRAPHIC STUDIES: No results found.  ASSESSMENT AND PLAN:  This is a very pleasant 62 years old African-American male recently diagnosed with a stage IIIA non-small cell lung cancer, adenocarcinoma presented with left lower lobe lung mass in addition to mediastinal lymphadenopathy.  The patient underwent a course of concurrent chemoradiation with weekly carboplatin and paclitaxel status post 7 cycles and tolerated his treatment fairly well. He  is currently undergoing treatment with consolidation immunotherapy with Imfinzi (Durvalumab) status post 16 cycles.   The patient continues to tolerate his consolidation treatment with Imfinzi (Durvalumab) fairly well.  I recommended for him to proceed with cycle #17 today as a scheduled. I will see him back for follow-up visit in 2 weeks for evaluation before the next cycle of his treatment. He was advised to call immediately if he has any concerning symptoms in the interval. The patient voices understanding of current disease status and treatment options and is in agreement with the current care plan. All questions were answered. The patient knows to call the clinic with any problems, questions or concerns. We can certainly see the patient much sooner if necessary.  Disclaimer: This note was dictated with voice recognition software. Similar sounding words can inadvertently be transcribed and may not be corrected upon review.

## 2018-03-28 ENCOUNTER — Ambulatory Visit (INDEPENDENT_AMBULATORY_CARE_PROVIDER_SITE_OTHER): Payer: Medicare Other | Admitting: Student

## 2018-03-28 ENCOUNTER — Encounter: Payer: Self-pay | Admitting: Student

## 2018-03-28 VITALS — BP 106/68 | HR 100 | Ht 66.0 in | Wt 202.6 lb

## 2018-03-28 DIAGNOSIS — Z86711 Personal history of pulmonary embolism: Secondary | ICD-10-CM

## 2018-03-28 DIAGNOSIS — J449 Chronic obstructive pulmonary disease, unspecified: Secondary | ICD-10-CM | POA: Diagnosis not present

## 2018-03-28 DIAGNOSIS — R600 Localized edema: Secondary | ICD-10-CM | POA: Diagnosis not present

## 2018-03-28 DIAGNOSIS — C3490 Malignant neoplasm of unspecified part of unspecified bronchus or lung: Secondary | ICD-10-CM | POA: Diagnosis not present

## 2018-03-28 DIAGNOSIS — I5032 Chronic diastolic (congestive) heart failure: Secondary | ICD-10-CM

## 2018-03-28 NOTE — Patient Instructions (Signed)
Medication Instructions:  Your physician recommends that you continue on your current medications as directed. Please refer to the Current Medication list given to you today.    Labwork: NONE   Testing/Procedures: NONE   Follow-Up: Your physician wants you to follow-up in: 3 Months with Dr. Theressa Stamps will receive a reminder letter in the mail two months in advance. If you don't receive a letter, please call our office to schedule the follow-up appointment.   Any Other Special Instructions Will Be Listed Below (If Applicable).     If you need a refill on your cardiac medications before your next appointment, please call your pharmacy.  Thank you for choosing [facility]!

## 2018-04-01 DIAGNOSIS — R0902 Hypoxemia: Secondary | ICD-10-CM | POA: Diagnosis not present

## 2018-04-01 DIAGNOSIS — I5032 Chronic diastolic (congestive) heart failure: Secondary | ICD-10-CM | POA: Diagnosis not present

## 2018-04-01 DIAGNOSIS — Z9981 Dependence on supplemental oxygen: Secondary | ICD-10-CM | POA: Diagnosis not present

## 2018-04-01 DIAGNOSIS — J449 Chronic obstructive pulmonary disease, unspecified: Secondary | ICD-10-CM | POA: Diagnosis not present

## 2018-04-10 ENCOUNTER — Encounter: Payer: Self-pay | Admitting: Oncology

## 2018-04-10 ENCOUNTER — Inpatient Hospital Stay: Payer: Medicare Other | Attending: Internal Medicine

## 2018-04-10 ENCOUNTER — Inpatient Hospital Stay: Payer: Medicare Other

## 2018-04-10 ENCOUNTER — Telehealth: Payer: Self-pay | Admitting: Oncology

## 2018-04-10 ENCOUNTER — Inpatient Hospital Stay (HOSPITAL_BASED_OUTPATIENT_CLINIC_OR_DEPARTMENT_OTHER): Payer: Medicare Other | Admitting: Oncology

## 2018-04-10 VITALS — BP 119/71 | HR 111 | Temp 97.7°F | Resp 19 | Ht 66.0 in | Wt 197.2 lb

## 2018-04-10 DIAGNOSIS — E669 Obesity, unspecified: Secondary | ICD-10-CM | POA: Diagnosis not present

## 2018-04-10 DIAGNOSIS — Z125 Encounter for screening for malignant neoplasm of prostate: Secondary | ICD-10-CM | POA: Diagnosis not present

## 2018-04-10 DIAGNOSIS — Z5112 Encounter for antineoplastic immunotherapy: Secondary | ICD-10-CM

## 2018-04-10 DIAGNOSIS — I5032 Chronic diastolic (congestive) heart failure: Secondary | ICD-10-CM | POA: Diagnosis not present

## 2018-04-10 DIAGNOSIS — Z79899 Other long term (current) drug therapy: Secondary | ICD-10-CM | POA: Diagnosis not present

## 2018-04-10 DIAGNOSIS — C3492 Malignant neoplasm of unspecified part of left bronchus or lung: Secondary | ICD-10-CM

## 2018-04-10 DIAGNOSIS — E785 Hyperlipidemia, unspecified: Secondary | ICD-10-CM | POA: Diagnosis not present

## 2018-04-10 DIAGNOSIS — C3432 Malignant neoplasm of lower lobe, left bronchus or lung: Secondary | ICD-10-CM

## 2018-04-10 DIAGNOSIS — Z7901 Long term (current) use of anticoagulants: Secondary | ICD-10-CM | POA: Insufficient documentation

## 2018-04-10 LAB — CBC WITH DIFFERENTIAL/PLATELET
BASOS ABS: 0.1 10*3/uL (ref 0.0–0.1)
BASOS PCT: 1 %
EOS ABS: 0.2 10*3/uL (ref 0.0–0.5)
EOS PCT: 4 %
HCT: 37.7 % — ABNORMAL LOW (ref 38.4–49.9)
HEMOGLOBIN: 11.7 g/dL — AB (ref 13.0–17.1)
Lymphocytes Relative: 16 %
Lymphs Abs: 0.8 10*3/uL — ABNORMAL LOW (ref 0.9–3.3)
MCH: 23.6 pg — AB (ref 27.2–33.4)
MCHC: 31 g/dL — ABNORMAL LOW (ref 32.0–36.0)
MCV: 76.2 fL — ABNORMAL LOW (ref 79.3–98.0)
Monocytes Absolute: 0.4 10*3/uL (ref 0.1–0.9)
Monocytes Relative: 7 %
NEUTROS PCT: 72 %
Neutro Abs: 3.6 10*3/uL (ref 1.5–6.5)
PLATELETS: 250 10*3/uL (ref 140–400)
RBC: 4.95 MIL/uL (ref 4.20–5.82)
RDW: 17.8 % — ABNORMAL HIGH (ref 11.0–14.6)
WBC: 4.9 10*3/uL (ref 4.0–10.3)

## 2018-04-10 LAB — COMPREHENSIVE METABOLIC PANEL
ALT: <6 U/L (ref 0–55)
AST: 10 U/L (ref 5–34)
Albumin: 3.7 g/dL (ref 3.5–5.0)
Alkaline Phosphatase: 100 U/L (ref 40–150)
Anion gap: 10 (ref 3–11)
BUN: 16 mg/dL (ref 7–26)
CALCIUM: 9.6 mg/dL (ref 8.4–10.4)
CO2: 29 mmol/L (ref 22–29)
Chloride: 99 mmol/L (ref 98–109)
Creatinine, Ser: 1.37 mg/dL — ABNORMAL HIGH (ref 0.70–1.30)
GFR, EST NON AFRICAN AMERICAN: 54 mL/min — AB (ref >60)
Glucose, Bld: 98 mg/dL (ref 70–140)
Potassium: 4.3 mmol/L (ref 3.5–5.1)
Sodium: 138 mmol/L (ref 136–145)
TOTAL PROTEIN: 8.8 g/dL — AB (ref 6.4–8.3)

## 2018-04-10 MED ORDER — SODIUM CHLORIDE 0.9 % IV SOLN
Freq: Once | INTRAVENOUS | Status: AC
  Administered 2018-04-10: 13:00:00 via INTRAVENOUS

## 2018-04-10 MED ORDER — SODIUM CHLORIDE 0.9 % IV SOLN
9.7000 mg/kg | Freq: Once | INTRAVENOUS | Status: AC
Start: 2018-04-10 — End: 2018-04-10
  Administered 2018-04-10: 860 mg via INTRAVENOUS
  Filled 2018-04-10: qty 10

## 2018-04-10 MED ORDER — DEXAMETHASONE SODIUM PHOSPHATE 10 MG/ML IJ SOLN
10.0000 mg | Freq: Once | INTRAMUSCULAR | Status: AC
  Administered 2018-04-10: 10 mg via INTRAVENOUS

## 2018-04-10 MED ORDER — HEPARIN SOD (PORK) LOCK FLUSH 100 UNIT/ML IV SOLN
500.0000 [IU] | Freq: Once | INTRAVENOUS | Status: AC | PRN
  Administered 2018-04-10: 500 [IU]
  Filled 2018-04-10: qty 5

## 2018-04-10 MED ORDER — DEXAMETHASONE SODIUM PHOSPHATE 10 MG/ML IJ SOLN
INTRAMUSCULAR | Status: AC
  Filled 2018-04-10: qty 1

## 2018-04-10 MED ORDER — ONDANSETRON HCL 8 MG PO TABS: 8.0000 mg | ORAL_TABLET | Freq: Three times a day (TID) | ORAL | 0 refills | Status: DC | PRN

## 2018-04-10 MED ORDER — SODIUM CHLORIDE 0.9% FLUSH
10.0000 mL | Freq: Once | INTRAVENOUS | Status: AC
  Administered 2018-04-10: 10 mL
  Filled 2018-04-10: qty 10

## 2018-04-10 MED ORDER — SODIUM CHLORIDE 0.9 % IV SOLN: 10.0000 mg | Freq: Once | INTRAVENOUS | Status: DC

## 2018-04-10 MED ORDER — SODIUM CHLORIDE 0.9% FLUSH
10.0000 mL | INTRAVENOUS | Status: DC | PRN
  Administered 2018-04-10: 10 mL
  Filled 2018-04-10: qty 10

## 2018-04-10 NOTE — Progress Notes (Signed)
Pt became nauseous during infusion and had active vomiting. Spoke with Sandi Mealy, PA. Orders given for home anti-emetics and 10 IV decadron.

## 2018-04-10 NOTE — Telephone Encounter (Signed)
Pt already has 3 cycles scheduled per 6/12 los.

## 2018-04-10 NOTE — Assessment & Plan Note (Addendum)
This is a very pleasant 62 year old African-American male recently diagnosed with a stage IIIA non-small cell lung cancer, adenocarcinoma presented with left lower lobe lung mass in addition to mediastinal lymphadenopathy.  The patient underwent a course of concurrent chemoradiation with weekly carboplatin and paclitaxel status post 7 cycles and tolerated his treatment fairly well. He is currently undergoing treatment with consolidation immunotherapy with Imfinzi (Durvalumab) status post 17 cycles.   The patient continues to tolerate his consolidation treatment with Imfinzi (Durvalumab) fairly well.  I recommended for him to proceed with cycle #18 today as a scheduled. The patient will have a restaging CT scan of the chest prior to his next visit. He will follow-up in 2 weeks for evaluation prior to cycle #19 and to review his restaging CT scan of the chest.  He was advised to call immediately if he has any concerning symptoms in the interval. The patient voices understanding of current disease status and treatment options and is in agreement with the current care plan. All questions were answered. The patient knows to call the clinic with any problems, questions or concerns. We can certainly see the patient much sooner if necessary.

## 2018-04-10 NOTE — Progress Notes (Signed)
[facility] OFF[facility] PROGRESS NOTE  James Beard, MD [address] [place] [address]  DIAGNOSIS: Stage IIIA (T2a, N2, M0) non-small cell lung cancer, adenocarcinoma presented with left lower lobe lung mass in addition to mediastinal lymphadenopathy diagnosed in June 2018. The patient also has suspicious soft tissue density in the pancreatic head but this has been present for 3 years on previous CT scan and unlikely to be related to [name], last dose was given 07/03/2017.  CURRENT THERAPY: Consolidation treatment with immunotherapy with Imfinzi (Durvalumab) 10 MG/KG every 2 weeks, first dose 08/15/2017.  Status post 17 cycles.  INTERVAL HISTORY: James Zamora 62 y.o. male returns for routine follow-up visit accompanied by his sister.  The patient is feeling fine today and has no specific complaints except for his baseline shortness of breath.  He wears home oxygen.  He continues to have bilateral lower extremity edema.  He remains on Lasix.  This is unchanged from previous reports.  He denies fevers and chills.  Denies chest pain, cough, hemoptysis.  Denies nausea, vomiting, constipation, diarrhea.  Denies recent weight loss or night sweats.  The patient is here for evaluation prior to starting cycle #18 of his treatment.  MEDICAL HISTORY: Past Medical History:  Diagnosis Date  . Adenocarcinoma of left lung, stage 3 ([facility]) 05/10/2017  . Aortic atherosclerosis ([facility]) 07/31/2017  . Asthma   . Chronic diastolic CHF (congestive heart failure) ([facility])    a. 07/2017: echo showing EF of 65-70% with Grade 1 DD  . Chronic fatigue 08/02/2017  . Lung mass   . Non-small cell lung cancer ([facility]) 11/13/2015  . Obesity   . Perforated bowel ([facility])     ALLERGIES:  is allergic to bee venom and penicillins.  MEDICATIONS:  Current  Outpatient Medications  Medication Sig Dispense Refill  . aspirin EC 81 MG EC tablet Take 1 tablet (81 mg total) by mouth daily. 30 tablet 1  . furosemide (LASIX) 80 MG tablet Take 1 tablet (80 mg total) by mouth daily. 90 tablet 3  . lidocaine-prilocaine (EMLA) cream Apply 1 application topically as needed. Apply small amount to port site 1-1.5 hours before treatment. Cover with plastic wrap. 30 g 0  . OXYGEN Inhale 4 L into the lungs continuous.     . potassium chloride SA (K-DUR,KLOR-CON) 20 MEQ tablet Take 2 tablets (40 mEq total) by mouth daily. 180 tablet 3  . rivaroxaban (XARELTO) 20 MG TABS tablet Take 1 tablet (20 mg total) by mouth daily with supper. Begin 20 mg daily after starter pack is complete. 30 tablet 2  . fluticasone furoate-vilanterol (BREO ELLIPTA) 200-25 MCG/INH AEPB Inhale 1 puff into the lungs daily. (Patient not taking: Reported on 04/10/2018) 1 each 5   No current facility-administered medications for this visit.     SURGICAL HISTORY:  Past Surgical History:  Procedure Laterality Date  . CHOLECYSTECTOMY    . COLONOSCOPY N/A 07/31/2016   Procedure: COLONOSCOPY;  Surgeon: Danie Binder, MD;  Location: [facility];  Service: Endoscopy;  Laterality: N/A;  1:45 pm  . ESOPHAGOGASTRODUODENOSCOPY N/A 07/31/2016   Procedure: ESOPHAGOGASTRODUODENOSCOPY (EGD);  Surgeon: Danie Binder, MD;  Location: [facility];  Service: Endoscopy;  Laterality: N/A;  . EUS N/A 06/07/2017   Procedure: UPPER ENDOSCOPIC ULTRASOUND (EUS) LINEAR;  Surgeon: Milus Banister,  MD;  Location: [facility];  Service: Endoscopy;  Laterality: N/A;  . HERNIA REPAIR    . IR FLUORO GUIDE PORT INSERTION RIGHT  07/11/2017  . IR US GUIDE VASC ACCESS RIGHT  07/11/2017    REVIEW OF SYSTEMS:   Review of Systems  Constitutional: Negative for appetite change, chills, fatigue, fever and unexpected weight change.  HENT:   Negative for mouth sores, nosebleeds, sore throat and trouble swallowing.   Eyes: Negative  for eye problems and icterus.  Respiratory: Negative for cough, hemoptysis, and wheezing.  Positive for his baseline shortness of breath.  He wears home oxygen. Cardiovascular: Negative for chest pain.  Positive for bilateral lower extremity edema.  Gastrointestinal: Negative for abdominal pain, constipation, diarrhea, nausea and vomiting.  Genitourinary: Negative for bladder incontinence, difficulty urinating, dysuria, frequency and hematuria.   Musculoskeletal: Negative for back pain, gait problem, neck pain and neck stiffness.  Skin: Negative for itching and rash.  Neurological: Negative for dizziness, extremity weakness, gait problem, headaches, light-headedness and seizures.  Hematological: Negative for adenopathy. Does not bruise/bleed easily.  Psychiatric/Behavioral: Negative for confusion, depression and sleep disturbance. The patient is not nervous/anxious.     PHYSICAL EXAMINATION:  Blood pressure 119/71, pulse (!) 111, temperature 97.7 F (36.5 C), temperature source Oral, resp. rate 19, height 5\' 6"  (1.676 m), weight 197 lb 3.2 oz (89.4 kg), SpO2 99 %.  ECOG PERFORMANCE STATUS: 1 - Symptomatic but completely ambulatory  Physical Exam  Constitutional: Oriented to person, place, and time and well-developed, well-nourished, and in no distress. No distress.  HENT:  Head: Normocephalic and atraumatic.  Mouth/Throat: Oropharynx is clear and moist. No oropharyngeal exudate.  Eyes: Conjunctivae are normal. Right eye exhibits no discharge. Left eye exhibits no discharge. No scleral icterus.  Neck: Normal range of motion. Neck supple.  Cardiovascular: Normal rate, regular rhythm, normal heart sounds and intact distal pulses.  2+ bilateral lower extremity edema. Pulmonary/Chest: Effort normal and breath sounds normal. No respiratory distress. No wheezes. No rales.  Abdominal: Soft. Bowel sounds are normal. Exhibits no distension and no mass. There is no tenderness.  Musculoskeletal:  Normal range of motion.   Lymphadenopathy:    No cervical adenopathy.  Neurological: Alert and oriented to person, place, and time. Exhibits normal muscle tone. Gait normal. Coordination normal.  Skin: Skin is warm and dry. No rash noted. Not diaphoretic. No erythema. No pallor.  Psychiatric: Mood, memory and judgment normal.  Vitals reviewed.  LABORATORY DATA: Lab Results  Component Value Date   WBC 4.9 04/10/2018   HGB 11.7 (L) 04/10/2018   HCT 37.7 (L) 04/10/2018   MCV 76.2 (L) 04/10/2018   PLT 250 04/10/2018      Chemistry      Component Value Date/Time   NA 138 04/10/2018 1037   NA 139 12-21-202018 1313   K 4.3 04/10/2018 1037   K 3.8 12-21-202018 1313   CL 99 04/10/2018 1037   CO2 29 04/10/2018 1037   CO2 27 12-21-202018 1313   BUN 16 04/10/2018 1037   BUN 9.8 12-21-202018 1313   CREATININE 1.37 (H) 04/10/2018 1037   CREATININE 0.9 12-21-202018 1313      Component Value Date/Time   CALCIUM 9.6 04/10/2018 1037   CALCIUM 8.6 12-21-202018 1313   ALKPHOS 100 04/10/2018 1037   ALKPHOS 57 12-21-202018 1313   AST 10 04/10/2018 1037   AST 9 12-21-202018 1313   ALT <6 04/10/2018 1037   ALT 6 12-21-202018 1313   BILITOT <0.2 (L) 04/10/2018  1037   BILITOT 0.23 28-Dec-202018 1313       RADIOGRAPHIC STUDIES:  No results found.   ASSESSMENT/PLAN:  Adenocarcinoma of left lung, stage 3 ([facility]) This is a very pleasant 62 year old African-American male recently diagnosed with a stage IIIA non-small cell lung cancer, adenocarcinoma presented with left lower lobe lung mass in addition to mediastinal lymphadenopathy.  The patient underwent a course of concurrent chemoradiation with weekly carboplatin and paclitaxel status post 7 cycles and tolerated his treatment fairly well. He is currently undergoing treatment with consolidation immunotherapy with Imfinzi (Durvalumab) status post 17 cycles.   The patient continues to tolerate his consolidation treatment with Imfinzi (Durvalumab) fairly well.  I  recommended for him to proceed with cycle #18 today as a scheduled. The patient will have a restaging CT scan of the chest prior to his next visit. He will follow-up in 2 weeks for evaluation prior to cycle #19 and to review his restaging CT scan of the chest.  He was advised to call immediately if he has any concerning symptoms in the interval. The patient voices understanding of current disease status and treatment options and is in agreement with the current care plan. All questions were answered. The patient knows to call the clinic with any problems, questions or concerns. We can certainly see the patient much sooner if necessary.   Orders Placed This Encounter  Procedures  . CT CHEST W CONTRAST    Standing Status:   Future    Standing Expiration Date:   04/11/2019    Order Specific Question:   If indicated for the ordered procedure, I authorize the administration of contrast media per Radiology protocol    Answer:   Yes    Order Specific Question:   Preferred imaging location?    Answer:   [facility]    Order Specific Question:   Radiology Contrast Protocol - do NOT remove file path    Answer:   [email or web address]    Order Specific Question:   Reason for Exam additional comments    Answer:   lung cancer. restaging.   James Bussing, DNP, AGPCNP-BC, AOCNP 04/10/18

## 2018-04-23 ENCOUNTER — Telehealth: Payer: Self-pay | Admitting: Internal Medicine

## 2018-04-23 ENCOUNTER — Encounter (HOSPITAL_COMMUNITY): Payer: Self-pay | Admitting: Radiology

## 2018-04-23 ENCOUNTER — Ambulatory Visit (HOSPITAL_COMMUNITY)
Admission: RE | Admit: 2018-04-23 | Discharge: 2018-04-23 | Disposition: A | Discharge status: Home / Self Care | Payer: Medicare Other | Source: Ambulatory Visit | Attending: Oncology | Admitting: Oncology

## 2018-04-23 DIAGNOSIS — R590 Localized enlarged lymph nodes: Secondary | ICD-10-CM | POA: Insufficient documentation

## 2018-04-23 DIAGNOSIS — I289 Disease of pulmonary vessels, unspecified: Secondary | ICD-10-CM | POA: Diagnosis not present

## 2018-04-23 DIAGNOSIS — J90 Pleural effusion, not elsewhere classified: Secondary | ICD-10-CM | POA: Insufficient documentation

## 2018-04-23 DIAGNOSIS — C3432 Malignant neoplasm of lower lobe, left bronchus or lung: Secondary | ICD-10-CM | POA: Diagnosis not present

## 2018-04-23 DIAGNOSIS — I7789 Other specified disorders of arteries and arterioles: Secondary | ICD-10-CM | POA: Insufficient documentation

## 2018-04-23 DIAGNOSIS — I2510 Atherosclerotic heart disease of native coronary artery without angina pectoris: Secondary | ICD-10-CM | POA: Diagnosis not present

## 2018-04-23 DIAGNOSIS — I700 Atherosclerosis of aorta: Secondary | ICD-10-CM | POA: Insufficient documentation

## 2018-04-23 DIAGNOSIS — J439 Emphysema, unspecified: Secondary | ICD-10-CM | POA: Diagnosis not present

## 2018-04-23 DIAGNOSIS — Z5111 Encounter for antineoplastic chemotherapy: Secondary | ICD-10-CM | POA: Diagnosis not present

## 2018-04-23 DIAGNOSIS — C3490 Malignant neoplasm of unspecified part of unspecified bronchus or lung: Secondary | ICD-10-CM | POA: Diagnosis not present

## 2018-04-23 MED ORDER — IOHEXOL 300 MG/ML  SOLN
75.0000 mL | Freq: Once | INTRAMUSCULAR | Status: AC | PRN
  Administered 2018-04-23: 75 mL via INTRAVENOUS

## 2018-04-23 NOTE — Telephone Encounter (Signed)
Called pt re appts that were moved - spoke w/ sister re appts.

## 2018-04-24 ENCOUNTER — Ambulatory Visit: Payer: Medicare Other | Admitting: Oncology

## 2018-04-24 ENCOUNTER — Inpatient Hospital Stay: Payer: Medicare Other

## 2018-04-24 ENCOUNTER — Telehealth: Payer: Self-pay | Admitting: Oncology

## 2018-04-24 ENCOUNTER — Other Ambulatory Visit: Payer: Medicare Other

## 2018-04-24 ENCOUNTER — Encounter: Payer: Self-pay | Admitting: Oncology

## 2018-04-24 ENCOUNTER — Ambulatory Visit: Payer: Medicare Other

## 2018-04-24 ENCOUNTER — Inpatient Hospital Stay (HOSPITAL_BASED_OUTPATIENT_CLINIC_OR_DEPARTMENT_OTHER): Payer: Medicare Other | Admitting: Oncology

## 2018-04-24 VITALS — BP 113/70 | HR 94 | Temp 97.4°F | Resp 18 | Ht 66.0 in | Wt 208.9 lb

## 2018-04-24 DIAGNOSIS — Z5112 Encounter for antineoplastic immunotherapy: Secondary | ICD-10-CM | POA: Diagnosis not present

## 2018-04-24 DIAGNOSIS — Z79899 Other long term (current) drug therapy: Secondary | ICD-10-CM | POA: Diagnosis not present

## 2018-04-24 DIAGNOSIS — C3432 Malignant neoplasm of lower lobe, left bronchus or lung: Secondary | ICD-10-CM

## 2018-04-24 DIAGNOSIS — Z9981 Dependence on supplemental oxygen: Secondary | ICD-10-CM

## 2018-04-24 DIAGNOSIS — R600 Localized edema: Secondary | ICD-10-CM | POA: Diagnosis not present

## 2018-04-24 DIAGNOSIS — R5382 Chronic fatigue, unspecified: Secondary | ICD-10-CM

## 2018-04-24 DIAGNOSIS — Z7901 Long term (current) use of anticoagulants: Secondary | ICD-10-CM | POA: Diagnosis not present

## 2018-04-24 DIAGNOSIS — C3492 Malignant neoplasm of unspecified part of left bronchus or lung: Secondary | ICD-10-CM

## 2018-04-24 DIAGNOSIS — E669 Obesity, unspecified: Secondary | ICD-10-CM | POA: Diagnosis not present

## 2018-04-24 LAB — CBC WITH DIFFERENTIAL/PLATELET
Basophils Absolute: 0 10*3/uL (ref 0.0–0.1)
Basophils Relative: 0 %
EOS ABS: 0.1 10*3/uL (ref 0.0–0.5)
EOS PCT: 2 %
HCT: 34.4 % — ABNORMAL LOW (ref 38.4–49.9)
Hemoglobin: 10.8 g/dL — ABNORMAL LOW (ref 13.0–17.1)
LYMPHS PCT: 15 %
Lymphs Abs: 1 10*3/uL (ref 0.9–3.3)
MCH: 23.9 pg — AB (ref 27.2–33.4)
MCHC: 31.4 g/dL — AB (ref 32.0–36.0)
MCV: 76.2 fL — ABNORMAL LOW (ref 79.3–98.0)
MONO ABS: 0.6 10*3/uL (ref 0.1–0.9)
Monocytes Relative: 9 %
NEUTROS PCT: 74 %
Neutro Abs: 4.5 10*3/uL (ref 1.5–6.5)
PLATELETS: 171 10*3/uL (ref 140–400)
RBC: 4.52 MIL/uL (ref 4.20–5.82)
RDW: 18.2 % — AB (ref 11.0–14.6)
WBC: 6.2 10*3/uL (ref 4.0–10.3)

## 2018-04-24 LAB — COMPREHENSIVE METABOLIC PANEL
ALBUMIN: 3.4 g/dL — AB (ref 3.5–5.0)
ALK PHOS: 107 U/L (ref 38–126)
ALT: 7 U/L (ref 0–44)
AST: 9 U/L — ABNORMAL LOW (ref 15–41)
Anion gap: 7 (ref 5–15)
BUN: 18 mg/dL (ref 8–23)
CALCIUM: 9.2 mg/dL (ref 8.9–10.3)
CO2: 31 mmol/L (ref 22–32)
Chloride: 101 mmol/L (ref 98–111)
Creatinine, Ser: 1 mg/dL (ref 0.61–1.24)
GFR calc non Af Amer: >60 mL/min (ref >60)
Glucose, Bld: 95 mg/dL (ref 70–99)
POTASSIUM: 4.1 mmol/L (ref 3.5–5.1)
SODIUM: 139 mmol/L (ref 135–145)
Total Bilirubin: <0.2 mg/dL — ABNORMAL LOW (ref 0.3–1.2)
Total Protein: 7.5 g/dL (ref 6.5–8.1)

## 2018-04-24 LAB — TSH: TSH: 0.476 u[IU]/mL (ref 0.320–4.118)

## 2018-04-24 MED ORDER — SODIUM CHLORIDE 0.9 % IV SOLN
9.8000 mg/kg | Freq: Once | INTRAVENOUS | Status: AC
  Administered 2018-04-24: 860 mg via INTRAVENOUS
  Filled 2018-04-24: qty 10

## 2018-04-24 MED ORDER — HEPARIN SOD (PORK) LOCK FLUSH 100 UNIT/ML IV SOLN
500.0000 [IU] | Freq: Once | INTRAVENOUS | Status: AC | PRN
  Administered 2018-04-24: 500 [IU]
  Filled 2018-04-24: qty 5

## 2018-04-24 MED ORDER — SODIUM CHLORIDE 0.9% FLUSH
10.0000 mL | INTRAVENOUS | Status: DC | PRN
  Administered 2018-04-24: 10 mL
  Filled 2018-04-24: qty 10

## 2018-04-24 MED ORDER — SODIUM CHLORIDE 0.9 % IV SOLN
Freq: Once | INTRAVENOUS | Status: AC
  Administered 2018-04-24: 12:00:00 via INTRAVENOUS

## 2018-04-24 MED ORDER — SODIUM CHLORIDE 0.9% FLUSH
10.0000 mL | Freq: Once | INTRAVENOUS | Status: AC
  Administered 2018-04-24: 10 mL
  Filled 2018-04-24: qty 10

## 2018-04-24 NOTE — Telephone Encounter (Signed)
3 cycles already scheduled per 6/26 los.

## 2018-04-24 NOTE — Progress Notes (Signed)
Discussed with MD. N/V experienced with last cycle likely a one time issue since patient has been on Imfinzi for a long time without concerns. Will monitor and add antiemetics if N/V continues.  Hardie Pulley, PharmD, BCPS, BCOP

## 2018-04-24 NOTE — Assessment & Plan Note (Signed)
This is a very pleasant 62year old African-American male recently diagnosed with a stage IIIA non-small cell lung cancer, adenocarcinoma presented with left lower lobe lung mass in addition to mediastinal lymphadenopathy.  The patient underwent a course of concurrent chemoradiation with weekly carboplatin and paclitaxel status post 7 cycles and tolerated his treatment fairly well. He is currently undergoing treatment with consolidation immunotherapy with Imfinzi (Durvalumab) status post 18cycles. He continues to tolerate treatment with Imfinzi fairly well with no concerning complaints. He had a restaging CT scan of the chest and is here to discuss the results.   The patient was seen with Dr. Julien Nordmann. CT scan results were discussed with the patient and his sister.  The CT scan does not show any evidence of disease progression.  Recommend that he continue with Imfinzi.  He will proceed with cycle 19 today as scheduled.  He will follow-up in 2 weeks for evaluation prior to cycle #20.  He was advised to call immediately if he has any concerning symptoms in the interval. The patient voices understanding of current disease status and treatment options and is in agreement with the current care plan. All questions were answered. The patient knows to call the clinic with any problems, questions or concerns. We can certainly see the patient much sooner if necessary.

## 2018-04-24 NOTE — Patient Instructions (Signed)
[facility] Discharge Instructions for Patients Receiving Chemotherapy  Today you received the following chemotherapy agents Imfinzi.  To help prevent nausea and vomiting after your treatment, we encourage you to take your nausea medication as prescribed.    If you develop nausea and vomiting that is not controlled by your nausea medication, call the clinic.   BELOW ARE SYMPTOMS THAT SHOULD BE REPORTED IMMEDIATELY:  *FEVER GREATER THAN 100.5 F  *CHILLS WITH OR WITHOUT FEVER  NAUSEA AND VOMITING THAT IS NOT CONTROLLED WITH YOUR NAUSEA MEDICATION  *UNUSUAL SHORTNESS OF BREATH  *UNUSUAL BRUISING OR BLEEDING  TENDERNESS IN MOUTH AND THROAT WITH OR WITHOUT PRESENCE OF ULCERS  *URINARY PROBLEMS  *BOWEL PROBLEMS  UNUSUAL RASH Items with * indicate a potential emergency and should be followed up as soon as possible.  Feel free to call the clinic should you have any questions or concerns. The clinic phone number is [phone].  Please show the [facility] at check-in to the Emergency Department and triage nurse.

## 2018-04-24 NOTE — Progress Notes (Signed)
[facility] OFFICE PROGRESS NOTE  James Beard, MD [address] [place] [address]  DIAGNOSIS: Stage IIIA (T2a, N2, M0) non-small cell lung cancer, adenocarcinoma presented with left lower lobe lung mass in addition to mediastinal lymphadenopathy diagnosed in June 2018. The patient also has suspicious soft tissue density in the pancreatic head but this has been present for 3 years on previous CT scan and unlikely to be related to [name], last dose was given 07/03/2017.  CURRENT THERAPY: Consolidation treatment with immunotherapy with Imfinzi (Durvalumab) 10 MG/KG every 2 weeks, first dose 08/15/2017. Status post 18cycles.  INTERVAL HISTORY: James Zamora 62 y.o. male returns for routine follow-up visit accompanied by his sister.  The patient is feeling fine today and has no specific plans except for his baseline shortness of breath.  He continues to wear home oxygen.  He continues to have bilateral lower extremity edema and remains on Lasix.  This is unchanged from previous reports.  He denies fevers and chills.  Denies chest pain, cough, hemoptysis.  Denies nausea, vomiting, constipation, diarrhea.  The patient has gained weight.  He denies night sweats.  The patient is here to discuss his restaging CT scan of the chest.  MEDICAL HISTORY: Past Medical History:  Diagnosis Date  . Adenocarcinoma of left lung, stage 3 ([place]) 05/10/2017  . Aortic atherosclerosis ([place]) 07/31/2017  . Asthma   . Chronic diastolic CHF (congestive heart failure) ([place])    a. 07/2017: echo showing EF of 65-70% with Grade 1 DD  . Chronic fatigue 08/02/2017  . Lung mass   . Non-small cell lung cancer ([place]) 11/13/2015  . Obesity   . Perforated bowel ([place])     ALLERGIES:  is allergic to bee venom and penicillins.  MEDICATIONS:   Current Outpatient Medications  Medication Sig Dispense Refill  . aspirin EC 81 MG EC tablet Take 1 tablet (81 mg total) by mouth daily. 30 tablet 1  . fluticasone furoate-vilanterol (BREO ELLIPTA) 200-25 MCG/INH AEPB Inhale 1 puff into the lungs daily. 1 each 5  . furosemide (LASIX) 80 MG tablet Take 1 tablet (80 mg total) by mouth daily. 90 tablet 3  . lidocaine-prilocaine (EMLA) cream Apply 1 application topically as needed. Apply small amount to port site 1-1.5 hours before treatment. Cover with plastic wrap. 30 g 0  . ondansetron (ZOFRAN) 8 MG tablet Take 1 tablet (8 mg total) by mouth every 8 (eight) hours as needed for nausea or vomiting. 30 tablet 0  . OXYGEN Inhale 4 L into the lungs continuous.     . potassium chloride SA (K-DUR,KLOR-CON) 20 MEQ tablet Take 2 tablets (40 mEq total) by mouth daily. 180 tablet 3  . rivaroxaban (XARELTO) 20 MG TABS tablet Take 1 tablet (20 mg total) by mouth daily with supper. Begin 20 mg daily after starter pack is complete. 30 tablet 2   No current facility-administered medications for this visit.    Facility-Administered Medications Ordered in Other Visits  Medication Dose Route Frequency Provider Last Rate Last Dose  . sodium chloride flush (NS) 0.9 % injection 10 mL  10 mL Intracatheter PRN Curt Bears, MD   10 mL at 04/24/18 1341    SURGICAL HISTORY:  Past Surgical History:  Procedure Laterality Date  . CHOLECYSTECTOMY    . COLONOSCOPY N/A 07/31/2016  Procedure: COLONOSCOPY;  Surgeon: Danie Binder, MD;  [facility];  Service: Endoscopy;  Laterality: N/A;  1:45 pm  . ESOPHAGOGASTRODUODENOSCOPY N/A 07/31/2016   Procedure: ESOPHAGOGASTRODUODENOSCOPY (EGD);  Surgeon: Danie Binder, MD;  [facility];  Service: Endoscopy;  Laterality: N/A;  . EUS N/A 06/07/2017   Procedure: UPPER ENDOSCOPIC ULTRASOUND (EUS) LINEAR;  Surgeon: Milus Banister, MD;  [facility];  Service: Endoscopy;  Laterality: N/A;  .  HERNIA REPAIR    . IR FLUORO GUIDE PORT INSERTION RIGHT  07/11/2017  . IR US GUIDE VASC ACCESS RIGHT  07/11/2017    REVIEW OF SYSTEMS:   Review of Systems  Constitutional: Negative for appetite change, chills, fatigue, fever and unexpected weight change.  HENT:   Negative for mouth sores, nosebleeds, sore throat and trouble swallowing.   Eyes: Negative for eye problems and icterus.  Respiratory: Negative for cough, hemoptysis, and wheezing.  He has baseline shortness of breath and wears home oxygen. Cardiovascular: Negative for chest pain.  Positive for lower extremity edema.  Gastrointestinal: Negative for abdominal pain, constipation, diarrhea, nausea and vomiting.  Genitourinary: Negative for bladder incontinence, difficulty urinating, dysuria, frequency and hematuria.   Musculoskeletal: Negative for back pain, gait problem, neck pain and neck stiffness.  Skin: Negative for itching and rash.  Neurological: Negative for dizziness, extremity weakness, gait problem, headaches, light-headedness and seizures.  Hematological: Negative for adenopathy. Does not bruise/bleed easily.  Psychiatric/Behavioral: Negative for confusion, depression and sleep disturbance. The patient is not nervous/anxious.     PHYSICAL EXAMINATION:  Blood pressure 113/70, pulse 94, temperature (!) 97.4 F (36.3 C), temperature source Oral, resp. rate 18, height 5\' 6"  (1.676 m), weight 208 lb 14.4 oz (94.8 kg), SpO2 98 %.  ECOG PERFORMANCE STATUS: 1 - Symptomatic but completely ambulatory  Physical Exam  Constitutional: Oriented to person, place, and time and well-developed, well-nourished, and in no distress. No distress.  HENT:  Head: Normocephalic and atraumatic.  Mouth/Throat: Oropharynx is clear and moist. No oropharyngeal exudate.  Eyes: Conjunctivae are normal. Right eye exhibits no discharge. Left eye exhibits no discharge. No scleral icterus.  Neck: Normal range of motion. Neck supple.  Cardiovascular:  Normal rate, regular rhythm, normal heart sounds and intact distal pulses.  2+ bilateral lower extremity edema. Pulmonary/Chest: Effort normal and breath sounds normal. No respiratory distress. No wheezes. No rales.  Abdominal: Soft. Bowel sounds are normal. Exhibits no distension and no mass. There is no tenderness.  Musculoskeletal: Normal range of motion.  Lymphadenopathy:    No cervical adenopathy.  Neurological: Alert and oriented to person, place, and time. Exhibits normal muscle tone. Gait normal. Coordination normal.  Skin: Skin is warm and dry. No rash noted. Not diaphoretic. No erythema. No pallor.  Psychiatric: Mood, memory and judgment normal.  Vitals reviewed.  LABORATORY DATA: Lab Results  Component Value Date   WBC 6.2 04/24/2018   HGB 10.8 (L) 04/24/2018   HCT 34.4 (L) 04/24/2018   MCV 76.2 (L) 04/24/2018   PLT 171 04/24/2018      Chemistry      Component Value Date/Time   NA 139 04/24/2018 1011   NA 139 Sep 24, 202018 1313   K 4.1 04/24/2018 1011   K 3.8 Sep 24, 202018 1313   CL 101 04/24/2018 1011   CO2 31 04/24/2018 1011   CO2 27 Sep 24, 202018 1313   BUN 18 04/24/2018 1011   BUN 9.8 Sep 24, 202018 1313   CREATININE 1.00 04/24/2018 1011   CREATININE 0.9 Sep 24, 202018 1313  Component Value Date/Time   CALCIUM 9.2 04/24/2018 1011   CALCIUM 8.6 Aug 04, 202018 1313   ALKPHOS 107 04/24/2018 1011   ALKPHOS 57 Aug 04, 202018 1313   AST 9 (L) 04/24/2018 1011   AST 9 Aug 04, 202018 1313   ALT 7 04/24/2018 1011   ALT 6 Aug 04, 202018 1313   BILITOT <0.2 (L) 04/24/2018 1011   BILITOT 0.23 Aug 04, 202018 1313       RADIOGRAPHIC STUDIES:  Ct Chest W Contrast  Result Date: 04/24/2018 CLINICAL DATA:  Left lung cancer, ongoing chemotherapy. EXAM: CT CHEST WITH CONTRAST TECHNIQUE: Multidetector CT imaging of the chest was performed during intravenous contrast administration. CONTRAST:  24mL OMNIPAQUE IOHEXOL 300 MG/ML  SOLN COMPARISON:  01/28/2018. FINDINGS: Cardiovascular: Right IJ Port-A-Cath  terminates in the high right atrium. Atherosclerotic calcification of the arterial vasculature, including coronary arteries. Pulmonary arteries are enlarged. Heart is at the upper limits of normal in size. No pericardial effusion. Mediastinum/Nodes: Low-attenuation lesions in the thyroid measure up to 11 mm on the left, as before. Mediastinal lymph nodes measure up to 12 mm in the low left paratracheal station, as before. No hilar or axillary adenopathy. Esophagus is grossly unremarkable. Lungs/Pleura: Moderate centrilobular emphysema. Post radiation scarring in the left hemithorax, including parenchymal consolidation, retraction and bronchiectasis in the central left lower lobe. Additional scattered pulmonary parenchymal scarring. Trace left pleural fluid, decreased from prior. Airway is unremarkable. Upper Abdomen: Visualized portions of the liver and right adrenal gland are unremarkable. Thickening of the left adrenal gland. Visualized portions of the kidneys, spleen, pancreas, stomach and bowel are grossly unremarkable. No upper abdominal adenopathy. Musculoskeletal: No worrisome lytic or sclerotic lesions. IMPRESSION: 1. Post treatment changes in the left hemithorax, stable, without evidence of metastatic disease. 2. Trace left pleural effusion, decreased from prior. 3. Borderline enlarged mediastinal lymph nodes, stable. 4. Aortic atherosclerosis (ICD10-170.0). Coronary artery calcification. 5. Enlarged pulmonary arteries, indicative of pulmonary arterial hypertension. 6.  Emphysema (ICD10-J43.9). Electronically Signed   By: Lorin Picket M.D.   On: 04/24/2018 08:49     ASSESSMENT/PLAN:  Adenocarcinoma of left lung, stage 3 ([place]) This is a very pleasant 62year old African-American male recently diagnosed with a stage IIIA non-small cell lung cancer, adenocarcinoma presented with left lower lobe lung mass in addition to mediastinal lymphadenopathy.  The patient underwent a course of concurrent  chemoradiation with weekly carboplatin and paclitaxel status post 7 cycles and tolerated his treatment fairly well. He is currently undergoing treatment with consolidation immunotherapy with Imfinzi (Durvalumab) status post 18cycles. He continues to tolerate treatment with Imfinzi fairly well with no concerning complaints. He had a restaging CT scan of the chest and is here to discuss the results.   The patient was seen with Dr. Julien Nordmann. CT scan results were discussed with the patient and his sister.  The CT scan does not show any evidence of disease progression.  Recommend that he continue with Imfinzi.  He will proceed with cycle 19 today as scheduled.  He will follow-up in 2 weeks for evaluation prior to cycle #20.  He was advised to call immediately if he has any concerning symptoms in the interval. The patient voices understanding of current disease status and treatment options and is in agreement with the current care plan. All questions were answered. The patient knows to call the clinic with any problems, questions or concerns. We can certainly see the patient much sooner if necessary.   No orders of the defined types were placed in this encounter.  Mikey Bussing, DNP, AGPCNP-BC,  AOCNP 04/24/18   ADDENDUM: Hematology/Oncology Attending: I had a face-to-face encounter with the patient.  I recommended his care plan.  This is a very pleasant 62 years old African-American male with a stage IIIa non-small cell lung cancer, adenocarcinoma status post induction systemic chemotherapy with carboplatin and paclitaxel.  The patient is currently undergoing consolidation immunotherapy with Imfinzi (Durvalumab) status post 18 cycles. He has been tolerating this treatment well with no concerning complaints. He had a repeat CT scan of the chest performed recently.  I personally and independently reviewed the scan and discussed the results with the patient and his sister.  His a scan showed no  concerning findings for disease progression. I recommended for the patient to continue his current treatment with Imfinzi (Durvalumab) and he will proceed with cycle #19 today. I will see him back for follow-up visit in 2 weeks for evaluation before starting cycle #20. The patient was advised to call immediately if he has any concerning symptoms in the interval.  Disclaimer: This note was dictated with voice recognition software. Similar sounding words can inadvertently be transcribed and may be missed upon review. Eilleen Kempf, MD 04/26/18

## 2018-05-07 NOTE — Progress Notes (Signed)
[facility] OFFICE PROGRESS NOTE  [name], MD [address] [place] [address]  DIAGNOSIS: Stage IIIA (T2a, N2, M0) non-small cell lung cancer, adenocarcinoma presented with left lower lobe lung mass in addition to mediastinal lymphadenopathy diagnosed in June 2018. The patient also has suspicious soft tissue density in the pancreatic head but this has been present for 3 years on previous CT scan and unlikely to be related to [name], last dose was given 07/03/2017.  CURRENT THERAPY: Consolidation treatment with immunotherapy with Imfinzi (Durvalumab) 10 MG/KG every 2 weeks, first dose 08/15/2017. Status post 19 cycles.  INTERVAL HISTORY: BERLEY GAMBRELL 62 y.o. male returns for routine follow-up visit accompanied by his sister.  The patient is feeling fine and has no specific complaints today except for his baseline shortness of breath.  He continues to wear home oxygen.  He continues to have bilateral lower extremity edema which is unchanged.  He remains on Lasix.  The patient denies fevers and chills.  Denies chest pain, cough, hemoptysis.  Denies nausea, vomiting, constipation, diarrhea.  Denies recent weight loss or night sweats.  The patient is here for evaluation prior to cycle #20 of Imfinzi.  MEDICAL HISTORY: Past Medical History:  Diagnosis Date  . Adenocarcinoma of left lung, stage 3 ([facility]) 05/10/2017  . Aortic atherosclerosis ([place]) 07/31/2017  . Asthma   . Chronic diastolic CHF (congestive heart failure) ([facility])    a. 07/2017: echo showing EF of 65-70% with Grade 1 DD  . Chronic fatigue 08/02/2017  . Lung mass   . Non-small cell lung cancer ([place]) 11/13/2015  . Obesity   . Perforated bowel ([place])     ALLERGIES:  is allergic to bee venom and penicillins.  MEDICATIONS:  Current Outpatient  Medications  Medication Sig Dispense Refill  . aspirin EC 81 MG EC tablet Take 1 tablet (81 mg total) by mouth daily. 30 tablet 1  . fluticasone furoate-vilanterol (BREO ELLIPTA) 200-25 MCG/INH AEPB Inhale 1 puff into the lungs daily. 1 each 5  . furosemide (LASIX) 80 MG tablet Take 1 tablet (80 mg total) by mouth daily. 90 tablet 3  . lidocaine-prilocaine (EMLA) cream Apply 1 application topically as needed. Apply small amount to port site 1-1.5 hours before treatment. Cover with plastic wrap. 30 g 0  . OXYGEN Inhale 4 L into the lungs continuous.     . potassium chloride SA (K-DUR,KLOR-CON) 20 MEQ tablet Take 2 tablets (40 mEq total) by mouth daily. 180 tablet 3  . rivaroxaban (XARELTO) 20 MG TABS tablet Take 1 tablet (20 mg total) by mouth daily with supper. Begin 20 mg daily after starter pack is complete. 30 tablet 2  . ondansetron (ZOFRAN) 8 MG tablet Take 1 tablet (8 mg total) by mouth every 8 (eight) hours as needed for nausea or vomiting. (Patient not taking: Reported on 05/08/2018) 30 tablet 0   No current facility-administered medications for this visit.     SURGICAL HISTORY:  Past Surgical History:  Procedure Laterality Date  . CHOLECYSTECTOMY    . COLONOSCOPY N/A 07/31/2016   Procedure: COLONOSCOPY;  Surgeon: Danie Binder, MD;  [facility];  Service: Endoscopy;  Laterality: N/A;  1:45 pm  . ESOPHAGOGASTRODUODENOSCOPY N/A 07/31/2016   Procedure: ESOPHAGOGASTRODUODENOSCOPY (EGD);  Surgeon: Danie Binder, MD;  [facility];  Service: Endoscopy;  Laterality: N/A;  . EUS N/A 06/07/2017   Procedure: UPPER ENDOSCOPIC ULTRASOUND (EUS) LINEAR;  Surgeon: Milus Banister, MD;  [facility];  Service: Endoscopy;  Laterality: N/A;  . HERNIA REPAIR    . IR FLUORO GUIDE PORT INSERTION RIGHT  07/11/2017  . IR US GUIDE VASC ACCESS RIGHT  07/11/2017    REVIEW OF SYSTEMS:   Review of Systems  Constitutional: Negative for appetite change, chills, fatigue, fever  and unexpected weight change.  HENT:   Negative for mouth sores, nosebleeds, sore throat and trouble swallowing.   Eyes: Negative for eye problems and icterus.  Respiratory: Negative for cough, hemoptysis, and wheezing.  Tested for his baseline shortness of breath.  He wears home oxygen.  Cardiovascular: Negative for chest pain.  Positive for lower extremity edema which is stable.  Gastrointestinal: Negative for abdominal pain, constipation, diarrhea, nausea and vomiting.  Genitourinary: Negative for bladder incontinence, difficulty urinating, dysuria, frequency and hematuria.   Musculoskeletal: Negative for back pain, gait problem, neck pain and neck stiffness.  Skin: Negative for itching and rash.  Neurological: Negative for dizziness, extremity weakness, gait problem, headaches, light-headedness and seizures.  Hematological: Negative for adenopathy. Does not bruise/bleed easily.  Psychiatric/Behavioral: Negative for confusion, depression and sleep disturbance. The patient is not nervous/anxious.     PHYSICAL EXAMINATION:  Blood pressure 126/77, pulse (!) 109, temperature 97.7 F (36.5 C), temperature source Oral, resp. rate 20, height 5\' 6"  (1.676 m), weight 204 lb 4.8 oz (92.7 kg), SpO2 97 %.  ECOG PERFORMANCE STATUS: 1 - Symptomatic but completely ambulatory  Physical Exam  Constitutional: Oriented to person, place, and time and well-developed, well-nourished, and in no distress. No distress.  HENT:  Head: Normocephalic and atraumatic.  Mouth/Throat: Oropharynx is clear and moist. No oropharyngeal exudate.  Eyes: Conjunctivae are normal. Right eye exhibits no discharge. Left eye exhibits no discharge. No scleral icterus.  Neck: Normal range of motion. Neck supple.  Cardiovascular: Normal rate, regular rhythm, normal heart sounds and intact distal pulses.  2+ bilateral lower extremity edema. Pulmonary/Chest: Effort normal and breath sounds normal. No respiratory distress. No wheezes.  No rales.  Abdominal: Soft. Bowel sounds are normal. Exhibits no distension and no mass. There is no tenderness.  Musculoskeletal: Normal range of motion.   Lymphadenopathy:    No cervical adenopathy.  Neurological: Alert and oriented to person, place, and time. Exhibits normal muscle tone. Gait normal. Coordination normal.  Skin: Skin is warm and dry. No rash noted. Not diaphoretic. No erythema. No pallor.  Psychiatric: Mood, memory and judgment normal.  Vitals reviewed.  LABORATORY DATA: Lab Results  Component Value Date   WBC 5.1 05/08/2018   HGB 11.3 (L) 05/08/2018   HCT 36.4 (L) 05/08/2018   MCV 76.3 (L) 05/08/2018   PLT 235 05/08/2018      Chemistry      Component Value Date/Time   NA 141 05/08/2018 0925   NA 139 2020-06-2317 1313   K 3.8 05/08/2018 0925   K 3.8 2020-06-2317 1313   CL 100 05/08/2018 0925   CO2 32 05/08/2018 0925   CO2 27 2020-06-2317 1313   BUN 18 05/08/2018 0925   BUN 9.8 2020-06-2317 1313   CREATININE 1.11 05/08/2018 0925   CREATININE 0.9 2020-06-2317 1313      Component Value Date/Time   CALCIUM 9.3 05/08/2018 0925   CALCIUM 8.6 2020-06-2317 1313   ALKPHOS 100 05/08/2018 0925   ALKPHOS 57 2020-06-2317 1313   AST 10 (L)  05/08/2018 0925   AST 9 Nov 23, 202018 1313   ALT 8 05/08/2018 0925   ALT 6 Nov 23, 202018 1313   BILITOT 0.3 05/08/2018 0925   BILITOT 0.23 Nov 23, 202018 1313       RADIOGRAPHIC STUDIES:  Ct Chest W Contrast  Result Date: 04/24/2018 CLINICAL DATA:  Left lung cancer, ongoing chemotherapy. EXAM: CT CHEST WITH CONTRAST TECHNIQUE: Multidetector CT imaging of the chest was performed during intravenous contrast administration. CONTRAST:  49mL OMNIPAQUE IOHEXOL 300 MG/ML  SOLN COMPARISON:  01/28/2018. FINDINGS: Cardiovascular: Right IJ Port-A-Cath terminates in the high right atrium. Atherosclerotic calcification of the arterial vasculature, including coronary arteries. Pulmonary arteries are enlarged. Heart is at the upper limits of normal in size. No  pericardial effusion. Mediastinum/Nodes: Low-attenuation lesions in the thyroid measure up to 11 mm on the left, as before. Mediastinal lymph nodes measure up to 12 mm in the low left paratracheal station, as before. No hilar or axillary adenopathy. Esophagus is grossly unremarkable. Lungs/Pleura: Moderate centrilobular emphysema. Post radiation scarring in the left hemithorax, including parenchymal consolidation, retraction and bronchiectasis in the central left lower lobe. Additional scattered pulmonary parenchymal scarring. Trace left pleural fluid, decreased from prior. Airway is unremarkable. Upper Abdomen: Visualized portions of the liver and right adrenal gland are unremarkable. Thickening of the left adrenal gland. Visualized portions of the kidneys, spleen, pancreas, stomach and bowel are grossly unremarkable. No upper abdominal adenopathy. Musculoskeletal: No worrisome lytic or sclerotic lesions. IMPRESSION: 1. Post treatment changes in the left hemithorax, stable, without evidence of metastatic disease. 2. Trace left pleural effusion, decreased from prior. 3. Borderline enlarged mediastinal lymph nodes, stable. 4. Aortic atherosclerosis (ICD10-170.0). Coronary artery calcification. 5. Enlarged pulmonary arteries, indicative of pulmonary arterial hypertension. 6.  Emphysema (ICD10-J43.9). Electronically Signed   By: Lorin Picket M.D.   On: 04/24/2018 08:49     ASSESSMENT/PLAN:  Adenocarcinoma of left lung, stage 3 ([place]) This is a very pleasant 62year old African-American male recently diagnosed with a stage IIIA non-small cell lung cancer, adenocarcinoma presented with left lower lobe lung mass in addition to mediastinal lymphadenopathy.  The patient underwent a course of concurrent chemoradiation with weekly carboplatin and paclitaxel status post 7 cycles and tolerated his treatment fairly well. He is currently undergoing treatment with consolidation immunotherapy with Imfinzi (Durvalumab)  status post 19cycles. He continues to tolerate treatment with Imfinzi fairly well with no concerning complaints. Recommend for him to proceed with cycle #20 of his treatment today as scheduled.  He will follow-up in 2 weeks for evaluation prior to cycle #21.  He was advised to call immediately if he has any concerning symptoms in the interval. The patient voices understanding of current disease status and treatment options and is in agreement with the current care plan. All questions were answered. The patient knows to call the clinic with any problems, questions or concerns. We can certainly see the patient much sooner if necessary.   No orders of the defined types were placed in this encounter.  Mikey Bussing, DNP, AGPCNP-BC, AOCNP 05/08/18

## 2018-05-08 ENCOUNTER — Inpatient Hospital Stay: Payer: Medicare Other | Attending: Internal Medicine

## 2018-05-08 ENCOUNTER — Encounter: Payer: Self-pay | Admitting: Oncology

## 2018-05-08 ENCOUNTER — Ambulatory Visit: Payer: Medicare Other

## 2018-05-08 ENCOUNTER — Telehealth: Payer: Self-pay | Admitting: Internal Medicine

## 2018-05-08 ENCOUNTER — Inpatient Hospital Stay: Payer: Medicare Other

## 2018-05-08 ENCOUNTER — Inpatient Hospital Stay (HOSPITAL_BASED_OUTPATIENT_CLINIC_OR_DEPARTMENT_OTHER): Payer: Medicare Other | Admitting: Oncology

## 2018-05-08 VITALS — BP 126/77 | HR 109 | Temp 97.7°F | Resp 20 | Ht 66.0 in | Wt 204.3 lb

## 2018-05-08 DIAGNOSIS — I5032 Chronic diastolic (congestive) heart failure: Secondary | ICD-10-CM | POA: Insufficient documentation

## 2018-05-08 DIAGNOSIS — C3492 Malignant neoplasm of unspecified part of left bronchus or lung: Secondary | ICD-10-CM

## 2018-05-08 DIAGNOSIS — Z5112 Encounter for antineoplastic immunotherapy: Secondary | ICD-10-CM | POA: Insufficient documentation

## 2018-05-08 DIAGNOSIS — E669 Obesity, unspecified: Secondary | ICD-10-CM | POA: Diagnosis not present

## 2018-05-08 DIAGNOSIS — C3432 Malignant neoplasm of lower lobe, left bronchus or lung: Secondary | ICD-10-CM

## 2018-05-08 DIAGNOSIS — J45909 Unspecified asthma, uncomplicated: Secondary | ICD-10-CM | POA: Insufficient documentation

## 2018-05-08 DIAGNOSIS — C771 Secondary and unspecified malignant neoplasm of intrathoracic lymph nodes: Secondary | ICD-10-CM

## 2018-05-08 DIAGNOSIS — Z7901 Long term (current) use of anticoagulants: Secondary | ICD-10-CM | POA: Diagnosis not present

## 2018-05-08 DIAGNOSIS — Z79899 Other long term (current) drug therapy: Secondary | ICD-10-CM | POA: Diagnosis not present

## 2018-05-08 LAB — COMPREHENSIVE METABOLIC PANEL
ALT: 8 U/L (ref 0–44)
AST: 10 U/L — ABNORMAL LOW (ref 15–41)
Albumin: 3.5 g/dL (ref 3.5–5.0)
Alkaline Phosphatase: 100 U/L (ref 38–126)
Anion gap: 9 (ref 5–15)
BILIRUBIN TOTAL: 0.3 mg/dL (ref 0.3–1.2)
BUN: 18 mg/dL (ref 8–23)
CO2: 32 mmol/L (ref 22–32)
CREATININE: 1.11 mg/dL (ref 0.61–1.24)
Calcium: 9.3 mg/dL (ref 8.9–10.3)
Chloride: 100 mmol/L (ref 98–111)
Glucose, Bld: 97 mg/dL (ref 70–99)
Potassium: 3.8 mmol/L (ref 3.5–5.1)
Sodium: 141 mmol/L (ref 135–145)
Total Protein: 7.8 g/dL (ref 6.5–8.1)

## 2018-05-08 LAB — CBC WITH DIFFERENTIAL/PLATELET
Basophils Absolute: 0 10*3/uL (ref 0.0–0.1)
Basophils Relative: 1 %
Eosinophils Absolute: 0.2 10*3/uL (ref 0.0–0.5)
Eosinophils Relative: 3 %
HEMATOCRIT: 36.4 % — AB (ref 38.4–49.9)
HEMOGLOBIN: 11.3 g/dL — AB (ref 13.0–17.1)
LYMPHS ABS: 0.8 10*3/uL — AB (ref 0.9–3.3)
LYMPHS PCT: 16 %
MCH: 23.8 pg — AB (ref 27.2–33.4)
MCHC: 31.1 g/dL — ABNORMAL LOW (ref 32.0–36.0)
MCV: 76.3 fL — AB (ref 79.3–98.0)
MONOS PCT: 9 %
Monocytes Absolute: 0.5 10*3/uL (ref 0.1–0.9)
NEUTROS PCT: 71 %
Neutro Abs: 3.6 10*3/uL (ref 1.5–6.5)
Platelets: 235 10*3/uL (ref 140–400)
RBC: 4.77 MIL/uL (ref 4.20–5.82)
RDW: 18.7 % — ABNORMAL HIGH (ref 11.0–14.6)
WBC: 5.1 10*3/uL (ref 4.0–10.3)

## 2018-05-08 MED ORDER — SODIUM CHLORIDE 0.9 % IV SOLN
860.0000 mg | Freq: Once | INTRAVENOUS | Status: AC
  Administered 2018-05-08: 860 mg via INTRAVENOUS
  Filled 2018-05-08: qty 10

## 2018-05-08 MED ORDER — SODIUM CHLORIDE 0.9 % IV SOLN
Freq: Once | INTRAVENOUS | Status: AC
  Administered 2018-05-08: 11:00:00 via INTRAVENOUS

## 2018-05-08 MED ORDER — SODIUM CHLORIDE 0.9% FLUSH
10.0000 mL | Freq: Once | INTRAVENOUS | Status: AC
  Administered 2018-05-08: 10 mL
  Filled 2018-05-08: qty 10

## 2018-05-08 MED ORDER — HEPARIN SOD (PORK) LOCK FLUSH 100 UNIT/ML IV SOLN
500.0000 [IU] | Freq: Once | INTRAVENOUS | Status: DC | PRN
  Filled 2018-05-08: qty 5

## 2018-05-08 MED ORDER — SODIUM CHLORIDE 0.9% FLUSH
10.0000 mL | INTRAVENOUS | Status: DC | PRN
  Filled 2018-05-08: qty 10

## 2018-05-08 NOTE — Patient Instructions (Signed)
[facility] Discharge Instructions for Patients Receiving Chemotherapy  Today you received the following chemotherapy agents Imfinzi.  To help prevent nausea and vomiting after your treatment, we encourage you to take your nausea medication as prescribed.    If you develop nausea and vomiting that is not controlled by your nausea medication, call the clinic.   BELOW ARE SYMPTOMS THAT SHOULD BE REPORTED IMMEDIATELY:  *FEVER GREATER THAN 100.5 F  *CHILLS WITH OR WITHOUT FEVER  NAUSEA AND VOMITING THAT IS NOT CONTROLLED WITH YOUR NAUSEA MEDICATION  *UNUSUAL SHORTNESS OF BREATH  *UNUSUAL BRUISING OR BLEEDING  TENDERNESS IN MOUTH AND THROAT WITH OR WITHOUT PRESENCE OF ULCERS  *URINARY PROBLEMS  *BOWEL PROBLEMS  UNUSUAL RASH Items with * indicate a potential emergency and should be followed up as soon as possible.  Feel free to call the clinic should you have any questions or concerns. The clinic phone number is [phone].  Please show the [facility] at check-in to the Emergency Department and triage nurse.

## 2018-05-08 NOTE — Telephone Encounter (Signed)
Appointments scheduled AVS/Calendar printed per 7/10 los

## 2018-05-08 NOTE — Assessment & Plan Note (Signed)
This is a very pleasant 62year old African-American male recently diagnosed with a stage IIIA non-small cell lung cancer, adenocarcinoma presented with left lower lobe lung mass in addition to mediastinal lymphadenopathy.  The patient underwent a course of concurrent chemoradiation with weekly carboplatin and paclitaxel status post 7 cycles and tolerated his treatment fairly well. He is currently undergoing treatment with consolidation immunotherapy with Imfinzi (Durvalumab) status post 19cycles. He continues to tolerate treatment with Imfinzi fairly well with no concerning complaints. Recommend for him to proceed with cycle #20 of his treatment today as scheduled.  He will follow-up in 2 weeks for evaluation prior to cycle #21.  He was advised to call immediately if he has any concerning symptoms in the interval. The patient voices understanding of current disease status and treatment options and is in agreement with the current care plan. All questions were answered. The patient knows to call the clinic with any problems, questions or concerns. We can certainly see the patient much sooner if necessary.

## 2018-05-21 NOTE — Progress Notes (Signed)
[facility] OFFICE PROGRESS NOTE  [name], MD [address] [place] [address]  DIAGNOSIS:Stage IIIA (T2a, N2, M0) non-small cell lung cancer, adenocarcinoma presented with left lower lobe lung mass in addition to mediastinal lymphadenopathy diagnosed in June 2018. The patient also has suspicious soft tissue density in the pancreatic head but this has been present for 3 years on previous CT scan and unlikely to be related to his current diagnosis of the lung cancer.  PRIOR THERAPY:Concurrent chemoradiation with weekly carboplatin for AUC of 2 and paclitaxel 45 MG/M2. Status post 7 cycles, last dose was given 07/03/2017.  CURRENT THERAPY:Consolidation treatment with immunotherapy with Imfinzi (Durvalumab) 10 MG/KG every 2 weeks, first dose 08/15/2017. Status post 20 cycles.  INTERVAL HISTORY: James Zamora 62 y.o. male returns for routine follow-up visit accompanied by his sister.  The patient is feeling fine today and has no specific complaints except for his baseline shortness of breath and swelling to his bilateral hands, right greater than left.  He noticed increase in swelling to his hands yesterday.  He reports that he has been compliant with his Lasix.  He does admit to taking in a lot of salt.  Denies pain to his hands.  The patient denies fevers and chills.  Denies chest pain, cough, hemoptysis.  Denies nausea, vomiting, constipation, diarrhea.  The patient continues to tolerate treatment with immunotherapy fairly well.  The patient is here for evaluation prior to cycle #21 of Imfinzi.  MEDICAL HISTORY: Past Medical History:  Diagnosis Date  . Adenocarcinoma of left lung, stage 3 ([place]) 05/10/2017  . Aortic atherosclerosis ([place]) 07/31/2017  . Asthma   . Chronic diastolic CHF (congestive heart failure) ([place])    a. 07/2017: echo showing EF of 65-70% with Grade 1 DD  . Chronic fatigue 08/02/2017  . Lung mass   . Non-small cell lung cancer ([place])  11/13/2015  . Obesity   . Perforated bowel ([place])     ALLERGIES:  is allergic to bee venom and penicillins.  MEDICATIONS:  Current Outpatient Medications  Medication Sig Dispense Refill  . aspirin EC 81 MG EC tablet Take 1 tablet (81 mg total) by mouth daily. 30 tablet 1  . furosemide (LASIX) 80 MG tablet Take 1 tablet (80 mg total) by mouth daily. 90 tablet 3  . lidocaine-prilocaine (EMLA) cream Apply 1 application topically as needed. Apply small amount to port site 1-1.5 hours before treatment. Cover with plastic wrap. 30 g 0  . ondansetron (ZOFRAN) 8 MG tablet Take 1 tablet (8 mg total) by mouth every 8 (eight) hours as needed for nausea or vomiting. 30 tablet 0  . OXYGEN Inhale 4 L into the lungs continuous.     . potassium chloride SA (K-DUR,KLOR-CON) 20 MEQ tablet Take 2 tablets (40 mEq total) by mouth daily. 180 tablet 3  . rivaroxaban (XARELTO) 20 MG TABS tablet Take 1 tablet (20 mg total) by mouth daily with supper. Begin 20 mg daily after starter pack is complete. 30 tablet 2  . fluticasone furoate-vilanterol (BREO ELLIPTA) 200-25 MCG/INH AEPB Inhale 1 puff into the lungs daily. (Patient not taking: Reported on 05/22/2018) 1 each 5   No current facility-administered medications for this visit.    Facility-Administered Medications Ordered in Other Visits  Medication Dose Route Frequency Provider Last Rate Last Dose  . 0.9 %  sodium chloride infusion   Intravenous Once Curt Bears, MD      . durvalumab (IMFINZI) 880 mg in  sodium chloride 0.9 % 100 mL chemo infusion  10 mg/kg (Treatment Plan Recorded) Intravenous Once Curt Bears, MD      . heparin lock flush 100 unit/mL  500 Units Intracatheter Once PRN Curt Bears, MD      . sodium chloride flush (NS) 0.9 % injection 10 mL  10 mL Intracatheter PRN Curt Bears, MD        SURGICAL HISTORY:  Past Surgical History:  Procedure Laterality Date  . CHOLECYSTECTOMY    . COLONOSCOPY N/A 07/31/2016   Procedure:  COLONOSCOPY;  Surgeon: Danie Binder, MD;  [facility];  Service: Endoscopy;  Laterality: N/A;  1:45 pm  . ESOPHAGOGASTRODUODENOSCOPY N/A 07/31/2016   Procedure: ESOPHAGOGASTRODUODENOSCOPY (EGD);  Surgeon: Danie Binder, MD;  Location[facility];  Service: Endoscopy;  Laterality: N/A;  . EUS N/A 06/07/2017   Procedure: UPPER ENDOSCOPIC ULTRASOUND (EUS) LINEAR;  Surgeon: Milus Banister, MD;  Location[facility];  Service: Endoscopy;  Laterality: N/A;  . HERNIA REPAIR    . IR FLUORO GUIDE PORT INSERTION RIGHT  07/11/2017  . IR US GUIDE VASC ACCESS RIGHT  07/11/2017    REVIEW OF SYSTEMS:   Review of Systems  Constitutional: Negative for appetite change, chills, fatigue, fever.  Positive for weight gain.  HENT:   Negative for mouth sores, nosebleeds, sore throat and trouble swallowing.   Eyes: Negative for eye problems and icterus.  Respiratory: Negative for cough, hemoptysis, and wheezing.  Positive for his baseline shortness of breath.  He wears home oxygen.  Cardiovascular: Negative for chest pain.  Positive for lower extremity edema and swelling to his bilateral hands, right greater than left.  Gastrointestinal: Negative for abdominal pain, constipation, diarrhea, nausea and vomiting.  Genitourinary: Negative for bladder incontinence, difficulty urinating, dysuria, frequency and hematuria.   Musculoskeletal: Negative for back pain, gait problem, neck pain and neck stiffness.  Skin: Negative for itching and rash.  Neurological: Negative for dizziness, extremity weakness, gait problem, headaches, light-headedness and seizures.  Hematological: Negative for adenopathy. Does not bruise/bleed easily.  Psychiatric/Behavioral: Negative for confusion, depression and sleep disturbance. The patient is not nervous/anxious.     PHYSICAL EXAMINATION:  Blood pressure 113/71, pulse 98, temperature 97.6 F (36.4 C), temperature source Oral, resp. rate 18, height 5\' 6"  (1.676 m), weight  212 lb 4.8 oz (96.3 kg), SpO2 94 %.  ECOG PERFORMANCE STATUS: 1 - Symptomatic but completely ambulatory  Physical Exam  Constitutional: Oriented to person, place, and time and well-developed, well-nourished, and in no distress. No distress.  HENT:  Head: Normocephalic and atraumatic.  Mouth/Throat: Oropharynx is clear and moist. No oropharyngeal exudate.  Eyes: Conjunctivae are normal. Right eye exhibits no discharge. Left eye exhibits no discharge. No scleral icterus.  Neck: Normal range of motion. Neck supple.  Cardiovascular: Normal rate, regular rhythm, normal heart sounds.  2+ bilateral lower extremity edema.  Trace edema to his bilateral hands, right greater than left.  Pulmonary/Chest: Effort normal and breath sounds normal. No respiratory distress. No wheezes. No rales.  Abdominal: Soft. Bowel sounds are normal. Exhibits no distension and no mass. There is no tenderness.  Musculoskeletal: Normal range of motion.  Lymphadenopathy:    No cervical adenopathy.  Neurological: Alert and oriented to person, place, and time. Exhibits normal muscle tone. Gait normal. Coordination normal.  Skin: Skin is warm and dry. No rash noted. Not diaphoretic. No erythema. No pallor.  Psychiatric: Mood, memory and judgment normal.  Vitals reviewed.  LABORATORY DATA: Lab Results  Component Value Date   WBC 5.2 05/22/2018   HGB 10.8 (L) 05/22/2018   HCT 35.7 (L) 05/22/2018   MCV 79.2 (L) 05/22/2018   PLT 185 05/22/2018      Chemistry      Component Value Date/Time   NA 139 05/22/2018 0917   NA 139 May 25, 202018 1313   K 4.0 05/22/2018 0917   K 3.8 May 25, 202018 1313   CL 100 05/22/2018 0917   CO2 31 05/22/2018 0917   CO2 27 May 25, 202018 1313   BUN 8 05/22/2018 0917   BUN 9.8 May 25, 202018 1313   CREATININE 0.97 05/22/2018 0917   CREATININE 0.9 May 25, 202018 1313      Component Value Date/Time   CALCIUM 9.1 05/22/2018 0917   CALCIUM 8.6 May 25, 202018 1313   ALKPHOS 98 05/22/2018 0917   ALKPHOS 57  May 25, 202018 1313   AST 8 (L) 05/22/2018 0917   AST 9 May 25, 202018 1313   ALT <6 05/22/2018 0917   ALT 6 May 25, 202018 1313   BILITOT 0.3 05/22/2018 0917   BILITOT 0.23 May 25, 202018 1313       RADIOGRAPHIC STUDIES:  Ct Chest W Contrast  Result Date: 04/24/2018 CLINICAL DATA:  Left lung cancer, ongoing chemotherapy. EXAM: CT CHEST WITH CONTRAST TECHNIQUE: Multidetector CT imaging of the chest was performed during intravenous contrast administration. CONTRAST:  42mL OMNIPAQUE IOHEXOL 300 MG/ML  SOLN COMPARISON:  01/28/2018. FINDINGS: Cardiovascular: Right IJ Port-A-Cath terminates in the high right atrium. Atherosclerotic calcification of the arterial vasculature, including coronary arteries. Pulmonary arteries are enlarged. Heart is at the upper limits of normal in size. No pericardial effusion. Mediastinum/Nodes: Low-attenuation lesions in the thyroid measure up to 11 mm on the left, as before. Mediastinal lymph nodes measure up to 12 mm in the low left paratracheal station, as before. No hilar or axillary adenopathy. Esophagus is grossly unremarkable. Lungs/Pleura: Moderate centrilobular emphysema. Post radiation scarring in the left hemithorax, including parenchymal consolidation, retraction and bronchiectasis in the central left lower lobe. Additional scattered pulmonary parenchymal scarring. Trace left pleural fluid, decreased from prior. Airway is unremarkable. Upper Abdomen: Visualized portions of the liver and right adrenal gland are unremarkable. Thickening of the left adrenal gland. Visualized portions of the kidneys, spleen, pancreas, stomach and bowel are grossly unremarkable. No upper abdominal adenopathy. Musculoskeletal: No worrisome lytic or sclerotic lesions. IMPRESSION: 1. Post treatment changes in the left hemithorax, stable, without evidence of metastatic disease. 2. Trace left pleural effusion, decreased from prior. 3. Borderline enlarged mediastinal lymph nodes, stable. 4. Aortic  atherosclerosis (ICD10-170.0). Coronary artery calcification. 5. Enlarged pulmonary arteries, indicative of pulmonary arterial hypertension. 6.  Emphysema (ICD10-J43.9). Electronically Signed   By: Lorin Picket M.D.   On: 04/24/2018 08:49     ASSESSMENT/PLAN:  Adenocarcinoma of left lung, stage 3 ([place]) This is a very pleasant 62year old African-American male recently diagnosed with a stage IIIA non-small cell lung cancer, adenocarcinoma presented with left lower lobe lung mass in addition to mediastinal lymphadenopathy.  The patient underwent a course of concurrent chemoradiation with weekly carboplatin and paclitaxel status post 7 cycles and tolerated his treatment fairly well. He is currently undergoing treatment with consolidation immunotherapy with Imfinzi (Durvalumab) status post 20cycles. He continues to tolerate treatment with Imfinzi fairly well with no concerning complaints. Recommend for him to proceed with cycle #21 of his treatment today as scheduled.  He will follow-up in 2 weeks for evaluation prior to cycle #22.  For the swelling in his hands, I have encouraged him to decrease his sodium intake.  He was advised to continue his Lasix and to contact cardiology if the edema worsens.  I do notice that he has gained weight since his last visit.  He was advised to call immediately if he has any concerning symptoms in the interval. The patient voices understanding of current disease status and treatment options and is in agreement with the current care plan. All questions were answered. The patient knows to call the clinic with any problems, questions or concerns. We can certainly see the patient much sooner if necessary.   No orders of the defined types were placed in this encounter.  Mikey Bussing, DNP, AGPCNP-BC, AOCNP 05/22/18

## 2018-05-21 NOTE — Assessment & Plan Note (Addendum)
This is a very pleasant 62year old African-American male recently diagnosed with a stage IIIA non-small cell lung cancer, adenocarcinoma presented with left lower lobe lung mass in addition to mediastinal lymphadenopathy.  The patient underwent a course of concurrent chemoradiation with weekly carboplatin and paclitaxel status post 7 cycles and tolerated his treatment fairly well. He is currently undergoing treatment with consolidation immunotherapy with Imfinzi (Durvalumab) status post 20cycles. He continues to tolerate treatment with Imfinzi fairly well with no concerning complaints. Recommend for him to proceed with cycle #21 of his treatment today as scheduled.  He will follow-up in 2 weeks for evaluation prior to cycle #22.  For the swelling in his hands, I have encouraged him to decrease his sodium intake.  He was advised to continue his Lasix and to contact cardiology if the edema worsens.  I do notice that he has gained weight since his last visit.  He was advised to call immediately if he has any concerning symptoms in the interval. The patient voices understanding of current disease status and treatment options and is in agreement with the current care plan. All questions were answered. The patient knows to call the clinic with any problems, questions or concerns. We can certainly see the patient much sooner if necessary.

## 2018-05-22 ENCOUNTER — Inpatient Hospital Stay: Payer: Medicare Other

## 2018-05-22 ENCOUNTER — Encounter: Payer: Self-pay | Admitting: Oncology

## 2018-05-22 ENCOUNTER — Inpatient Hospital Stay (HOSPITAL_BASED_OUTPATIENT_CLINIC_OR_DEPARTMENT_OTHER): Payer: Medicare Other | Admitting: Oncology

## 2018-05-22 VITALS — BP 113/71 | HR 98 | Temp 97.6°F | Resp 18 | Ht 66.0 in | Wt 212.3 lb

## 2018-05-22 DIAGNOSIS — C3432 Malignant neoplasm of lower lobe, left bronchus or lung: Secondary | ICD-10-CM

## 2018-05-22 DIAGNOSIS — Z9221 Personal history of antineoplastic chemotherapy: Secondary | ICD-10-CM

## 2018-05-22 DIAGNOSIS — Z5112 Encounter for antineoplastic immunotherapy: Secondary | ICD-10-CM

## 2018-05-22 DIAGNOSIS — R2230 Localized swelling, mass and lump, unspecified upper limb: Secondary | ICD-10-CM | POA: Diagnosis not present

## 2018-05-22 DIAGNOSIS — I5020 Unspecified systolic (congestive) heart failure: Secondary | ICD-10-CM

## 2018-05-22 DIAGNOSIS — J45909 Unspecified asthma, uncomplicated: Secondary | ICD-10-CM | POA: Diagnosis not present

## 2018-05-22 DIAGNOSIS — E669 Obesity, unspecified: Secondary | ICD-10-CM

## 2018-05-22 DIAGNOSIS — Z79899 Other long term (current) drug therapy: Secondary | ICD-10-CM | POA: Diagnosis not present

## 2018-05-22 DIAGNOSIS — C771 Secondary and unspecified malignant neoplasm of intrathoracic lymph nodes: Secondary | ICD-10-CM | POA: Diagnosis not present

## 2018-05-22 DIAGNOSIS — R5382 Chronic fatigue, unspecified: Secondary | ICD-10-CM

## 2018-05-22 DIAGNOSIS — C3492 Malignant neoplasm of unspecified part of left bronchus or lung: Secondary | ICD-10-CM

## 2018-05-22 DIAGNOSIS — I5032 Chronic diastolic (congestive) heart failure: Secondary | ICD-10-CM | POA: Diagnosis not present

## 2018-05-22 LAB — CBC WITH DIFFERENTIAL/PLATELET
Basophils Absolute: 0 10*3/uL (ref 0.0–0.1)
Basophils Relative: 0 %
Eosinophils Absolute: 0.1 10*3/uL (ref 0.0–0.5)
Eosinophils Relative: 3 %
HEMATOCRIT: 35.7 % — AB (ref 38.4–49.9)
HEMOGLOBIN: 10.8 g/dL — AB (ref 13.0–17.1)
LYMPHS ABS: 0.9 10*3/uL (ref 0.9–3.3)
LYMPHS PCT: 18 %
MCH: 23.9 pg — ABNORMAL LOW (ref 27.2–33.4)
MCHC: 30.3 g/dL — AB (ref 32.0–36.0)
MCV: 79.2 fL — AB (ref 79.3–98.0)
MONOS PCT: 7 %
Monocytes Absolute: 0.3 10*3/uL (ref 0.1–0.9)
NEUTROS PCT: 72 %
Neutro Abs: 3.8 10*3/uL (ref 1.5–6.5)
Platelets: 185 10*3/uL (ref 140–400)
RBC: 4.51 MIL/uL (ref 4.20–5.82)
RDW: 17.2 % — ABNORMAL HIGH (ref 11.0–14.6)
WBC: 5.2 10*3/uL (ref 4.0–10.3)

## 2018-05-22 LAB — COMPREHENSIVE METABOLIC PANEL
ANION GAP: 8 (ref 5–15)
AST: 8 U/L — ABNORMAL LOW (ref 15–41)
Albumin: 3.5 g/dL (ref 3.5–5.0)
Alkaline Phosphatase: 98 U/L (ref 38–126)
BUN: 8 mg/dL (ref 8–23)
CHLORIDE: 100 mmol/L (ref 98–111)
CO2: 31 mmol/L (ref 22–32)
CREATININE: 0.97 mg/dL (ref 0.61–1.24)
Calcium: 9.1 mg/dL (ref 8.9–10.3)
GFR calc non Af Amer: >60 mL/min (ref >60)
Glucose, Bld: 97 mg/dL (ref 70–99)
Potassium: 4 mmol/L (ref 3.5–5.1)
SODIUM: 139 mmol/L (ref 135–145)
Total Bilirubin: 0.3 mg/dL (ref 0.3–1.2)
Total Protein: 7.7 g/dL (ref 6.5–8.1)

## 2018-05-22 LAB — TSH: TSH: 0.549 u[IU]/mL (ref 0.320–4.118)

## 2018-05-22 MED ORDER — SODIUM CHLORIDE 0.9% FLUSH
10.0000 mL | INTRAVENOUS | Status: DC | PRN
  Filled 2018-05-22: qty 10

## 2018-05-22 MED ORDER — SODIUM CHLORIDE 0.9 % IV SOLN
Freq: Once | INTRAVENOUS | Status: AC
  Administered 2018-05-22: 11:00:00 via INTRAVENOUS
  Filled 2018-05-22: qty 250

## 2018-05-22 MED ORDER — SODIUM CHLORIDE 0.9 % IV SOLN
9.7000 mg/kg | Freq: Once | INTRAVENOUS | Status: AC
  Administered 2018-05-22: 860 mg via INTRAVENOUS
  Filled 2018-05-22: qty 7.2

## 2018-05-22 MED ORDER — HEPARIN SOD (PORK) LOCK FLUSH 100 UNIT/ML IV SOLN
500.0000 [IU] | Freq: Once | INTRAVENOUS | Status: DC | PRN
  Filled 2018-05-22: qty 5

## 2018-05-22 MED ORDER — SODIUM CHLORIDE 0.9% FLUSH
10.0000 mL | Freq: Once | INTRAVENOUS | Status: AC
  Administered 2018-05-22: 10 mL
  Filled 2018-05-22: qty 10

## 2018-05-22 NOTE — Progress Notes (Signed)
I spoke w/ Mikey Bussing, NP re: Imfinzi dose.  She feels pt has edema & this is cause of wt increase.  She'd like pt to stay on Imfinzi at previous dose (860 mg) rather than increasing dose. Kennith Center, Pharm.D., CPP 05/22/2018@11 :01 AM

## 2018-05-22 NOTE — Patient Instructions (Signed)
[facility] Discharge Instructions for Patients Receiving Chemotherapy  Today you received the following chemotherapy agents Imfinzi.  To help prevent nausea and vomiting after your treatment, we encourage you to take your nausea medication as prescribed.    If you develop nausea and vomiting that is not controlled by your nausea medication, call the clinic.   BELOW ARE SYMPTOMS THAT SHOULD BE REPORTED IMMEDIATELY:  *FEVER GREATER THAN 100.5 F  *CHILLS WITH OR WITHOUT FEVER  NAUSEA AND VOMITING THAT IS NOT CONTROLLED WITH YOUR NAUSEA MEDICATION  *UNUSUAL SHORTNESS OF BREATH  *UNUSUAL BRUISING OR BLEEDING  TENDERNESS IN MOUTH AND THROAT WITH OR WITHOUT PRESENCE OF ULCERS  *URINARY PROBLEMS  *BOWEL PROBLEMS  UNUSUAL RASH Items with * indicate a potential emergency and should be followed up as soon as possible.  Feel free to call the clinic should you have any questions or concerns. The clinic phone number is [phone].  Please show the [facility] at check-in to the Emergency Department and triage nurse.

## 2018-05-23 ENCOUNTER — Telehealth: Payer: Self-pay | Admitting: Oncology

## 2018-05-23 NOTE — Telephone Encounter (Signed)
Next three cycles already scheduled per 7/24 los - no additional appts added.

## 2018-06-05 ENCOUNTER — Encounter: Payer: Self-pay | Admitting: Internal Medicine

## 2018-06-05 ENCOUNTER — Inpatient Hospital Stay: Payer: Medicare Other

## 2018-06-05 ENCOUNTER — Telehealth: Payer: Self-pay | Admitting: Internal Medicine

## 2018-06-05 ENCOUNTER — Inpatient Hospital Stay (HOSPITAL_BASED_OUTPATIENT_CLINIC_OR_DEPARTMENT_OTHER): Payer: Medicare Other | Admitting: Internal Medicine

## 2018-06-05 ENCOUNTER — Inpatient Hospital Stay: Payer: Medicare Other | Attending: Internal Medicine

## 2018-06-05 VITALS — HR 102

## 2018-06-05 VITALS — BP 116/63 | HR 117 | Temp 98.5°F | Resp 20 | Ht 66.0 in | Wt 209.2 lb

## 2018-06-05 DIAGNOSIS — Z7901 Long term (current) use of anticoagulants: Secondary | ICD-10-CM | POA: Diagnosis not present

## 2018-06-05 DIAGNOSIS — R5382 Chronic fatigue, unspecified: Secondary | ICD-10-CM | POA: Diagnosis not present

## 2018-06-05 DIAGNOSIS — E669 Obesity, unspecified: Secondary | ICD-10-CM

## 2018-06-05 DIAGNOSIS — Z5112 Encounter for antineoplastic immunotherapy: Secondary | ICD-10-CM

## 2018-06-05 DIAGNOSIS — C3432 Malignant neoplasm of lower lobe, left bronchus or lung: Secondary | ICD-10-CM | POA: Diagnosis not present

## 2018-06-05 DIAGNOSIS — Z79899 Other long term (current) drug therapy: Secondary | ICD-10-CM

## 2018-06-05 DIAGNOSIS — C771 Secondary and unspecified malignant neoplasm of intrathoracic lymph nodes: Secondary | ICD-10-CM | POA: Diagnosis not present

## 2018-06-05 DIAGNOSIS — Z9981 Dependence on supplemental oxygen: Secondary | ICD-10-CM | POA: Insufficient documentation

## 2018-06-05 DIAGNOSIS — K8689 Other specified diseases of pancreas: Secondary | ICD-10-CM | POA: Insufficient documentation

## 2018-06-05 DIAGNOSIS — I2510 Atherosclerotic heart disease of native coronary artery without angina pectoris: Secondary | ICD-10-CM | POA: Diagnosis not present

## 2018-06-05 DIAGNOSIS — C3492 Malignant neoplasm of unspecified part of left bronchus or lung: Secondary | ICD-10-CM

## 2018-06-05 DIAGNOSIS — C3490 Malignant neoplasm of unspecified part of unspecified bronchus or lung: Secondary | ICD-10-CM

## 2018-06-05 LAB — COMPREHENSIVE METABOLIC PANEL
ALT: <6 U/L (ref 0–44)
AST: 13 U/L — ABNORMAL LOW (ref 15–41)
Albumin: 3.4 g/dL — ABNORMAL LOW (ref 3.5–5.0)
Alkaline Phosphatase: 88 U/L (ref 38–126)
Anion gap: 14 (ref 5–15)
BILIRUBIN TOTAL: 0.5 mg/dL (ref 0.3–1.2)
BUN: 16 mg/dL (ref 8–23)
CO2: 29 mmol/L (ref 22–32)
Calcium: 9.1 mg/dL (ref 8.9–10.3)
Chloride: 97 mmol/L — ABNORMAL LOW (ref 98–111)
Creatinine, Ser: 1.31 mg/dL — ABNORMAL HIGH (ref 0.61–1.24)
GFR, EST NON AFRICAN AMERICAN: 57 mL/min — AB (ref >60)
Glucose, Bld: 106 mg/dL — ABNORMAL HIGH (ref 70–99)
POTASSIUM: 3.6 mmol/L (ref 3.5–5.1)
Sodium: 140 mmol/L (ref 135–145)
TOTAL PROTEIN: 8.1 g/dL (ref 6.5–8.1)

## 2018-06-05 LAB — CBC WITH DIFFERENTIAL/PLATELET
BASOS ABS: 0 10*3/uL (ref 0.0–0.1)
Basophils Relative: 1 %
Eosinophils Absolute: 0.1 10*3/uL (ref 0.0–0.5)
Eosinophils Relative: 2 %
HEMATOCRIT: 33.2 % — AB (ref 38.4–49.9)
Hemoglobin: 10.6 g/dL — ABNORMAL LOW (ref 13.0–17.1)
LYMPHS PCT: 16 %
Lymphs Abs: 1 10*3/uL (ref 0.9–3.3)
MCH: 24.4 pg — ABNORMAL LOW (ref 27.2–33.4)
MCHC: 31.8 g/dL — ABNORMAL LOW (ref 32.0–36.0)
MCV: 76.9 fL — AB (ref 79.3–98.0)
Monocytes Absolute: 0.5 10*3/uL (ref 0.1–0.9)
Monocytes Relative: 9 %
Neutro Abs: 4.3 10*3/uL (ref 1.5–6.5)
Neutrophils Relative %: 72 %
PLATELETS: 243 10*3/uL (ref 140–400)
RBC: 4.32 MIL/uL (ref 4.20–5.82)
RDW: 19.2 % — ABNORMAL HIGH (ref 11.0–14.6)
WBC: 6 10*3/uL (ref 4.0–10.3)

## 2018-06-05 MED ORDER — HEPARIN SOD (PORK) LOCK FLUSH 100 UNIT/ML IV SOLN
500.0000 [IU] | Freq: Once | INTRAVENOUS | Status: AC | PRN
  Administered 2018-06-05: 500 [IU]
  Filled 2018-06-05: qty 5

## 2018-06-05 MED ORDER — SODIUM CHLORIDE 0.9 % IV SOLN
Freq: Once | INTRAVENOUS | Status: AC
  Administered 2018-06-05: 11:00:00 via INTRAVENOUS
  Filled 2018-06-05: qty 250

## 2018-06-05 MED ORDER — SODIUM CHLORIDE 0.9 % IV SOLN
860.0000 mg | Freq: Once | INTRAVENOUS | Status: AC
  Administered 2018-06-05: 860 mg via INTRAVENOUS
  Filled 2018-06-05: qty 10

## 2018-06-05 MED ORDER — SODIUM CHLORIDE 0.9% FLUSH
10.0000 mL | Freq: Once | INTRAVENOUS | Status: AC
  Administered 2018-06-05: 10 mL
  Filled 2018-06-05: qty 10

## 2018-06-05 MED ORDER — SODIUM CHLORIDE 0.9% FLUSH
10.0000 mL | INTRAVENOUS | Status: DC | PRN
  Administered 2018-06-05: 10 mL
  Filled 2018-06-05: qty 10

## 2018-06-05 NOTE — Telephone Encounter (Signed)
appts already scheduled per 8/7 los. No additional appts added.

## 2018-06-05 NOTE — Patient Instructions (Signed)
[facility] Discharge Instructions for Patients Receiving Chemotherapy  Today you received the following chemotherapy agents Imfinzi.  To help prevent nausea and vomiting after your treatment, we encourage you to take your nausea medication as prescribed.    If you develop nausea and vomiting that is not controlled by your nausea medication, call the clinic.   BELOW ARE SYMPTOMS THAT SHOULD BE REPORTED IMMEDIATELY:  *FEVER GREATER THAN 100.5 F  *CHILLS WITH OR WITHOUT FEVER  NAUSEA AND VOMITING THAT IS NOT CONTROLLED WITH YOUR NAUSEA MEDICATION  *UNUSUAL SHORTNESS OF BREATH  *UNUSUAL BRUISING OR BLEEDING  TENDERNESS IN MOUTH AND THROAT WITH OR WITHOUT PRESENCE OF ULCERS  *URINARY PROBLEMS  *BOWEL PROBLEMS  UNUSUAL RASH Items with * indicate a potential emergency and should be followed up as soon as possible.  Feel free to call the clinic should you have any questions or concerns. The clinic phone number is [phone].  Please show the [facility] at check-in to the Emergency Department and triage nurse.

## 2018-06-05 NOTE — Progress Notes (Signed)
[facility] [phone]   [phone]  OFFICE PROGRESS NOTE  James Beard, MD [address] [place] [address]  DIAGNOSIS: Stage IIIA (T2a, N2, M0) non-small cell lung cancer, adenocarcinoma presented with left lower lobe lung mass in addition to mediastinal lymphadenopathy diagnosed in June 2018. The patient also has suspicious soft tissue density in the pancreatic head but this has been present for 3 years on previous CT scan and unlikely to be related to [name], last dose was given 07/03/2017.  CURRENT THERAPY: Consolidation treatment with immunotherapy with Imfinzi (Durvalumab) 10 MG/KG every 2 weeks, first dose 08/15/2017.  Status post 21 cycles.  INTERVAL HISTORY: James Zamora 62 y.o. male returns to the clinic today for follow-up visit.  The patient is feeling fine today with no specific complaints except for the baseline shortness of breath and he is currently on home oxygen.  His oxygen saturation on presentation to the clinic was down to 77% but it looks like his oxygen tank was getting low.  It improved to 92% when we changed the oxygen tank.  The patient denied having any chest pain but continues to have mild cough with no hemoptysis.  He has no nausea, vomiting, diarrhea or constipation.  He has been tolerating his consolidation treatment with immunotherapy fairly well.  He is here for evaluation before starting cycle #22.   MEDICAL HISTORY: Past Medical History:  Diagnosis Date  . Adenocarcinoma of left lung, stage 3 ([facility]) 05/10/2017  . Aortic atherosclerosis ([facility]) 07/31/2017  . Asthma   . Chronic diastolic CHF (congestive heart failure) ([facility])    a. 07/2017: echo showing EF of 65-70% with Grade 1 DD  . Chronic fatigue 08/02/2017  . Lung mass   . Non-small cell lung  cancer ([facility]) 11/13/2015  . Obesity   . Perforated bowel ([facility])     ALLERGIES:  is allergic to bee venom and penicillins.  MEDICATIONS:  Current Outpatient Medications  Medication Sig Dispense Refill  . aspirin EC 81 MG EC tablet Take 1 tablet (81 mg total) by mouth daily. 30 tablet 1  . furosemide (LASIX) 80 MG tablet Take 1 tablet (80 mg total) by mouth daily. 90 tablet 3  . lidocaine-prilocaine (EMLA) cream Apply 1 application topically as needed. Apply small amount to port site 1-1.5 hours before treatment. Cover with plastic wrap. 30 g 0  . OXYGEN Inhale 4 L into the lungs continuous.     . potassium chloride SA (K-DUR,KLOR-CON) 20 MEQ tablet Take 2 tablets (40 mEq total) by mouth daily. 180 tablet 3  . rivaroxaban (XARELTO) 20 MG TABS tablet Take 1 tablet (20 mg total) by mouth daily with supper. Begin 20 mg daily after starter pack is complete. 30 tablet 2  . fluticasone furoate-vilanterol (BREO ELLIPTA) 200-25 MCG/INH AEPB Inhale 1 puff into the lungs daily. (Patient not taking: Reported on 05/22/2018) 1 each 5  . ondansetron (ZOFRAN) 8 MG tablet Take 1 tablet (8 mg total) by mouth every 8 (eight) hours as needed for nausea or vomiting. (Patient not taking: Reported on 06/05/2018) 30 tablet 0   No current facility-administered medications for this visit.     SURGICAL HISTORY:  Past Surgical History:  Procedure Laterality Date  . CHOLECYSTECTOMY    . COLONOSCOPY N/A 07/31/2016   Procedure:  COLONOSCOPY;  Surgeon: Danie Binder, MD;  [facility];  Service: Endoscopy;  Laterality: N/A;  1:45 pm  . ESOPHAGOGASTRODUODENOSCOPY N/A 07/31/2016   Procedure: ESOPHAGOGASTRODUODENOSCOPY (EGD);  Surgeon: Danie Binder, MD;  [facility];  Service: Endoscopy;  Laterality: N/A;  . EUS N/A 06/07/2017   Procedure: UPPER ENDOSCOPIC ULTRASOUND (EUS) LINEAR;  Surgeon: Milus Banister, MD;  [facility];  Service: Endoscopy;  Laterality: N/A;  . HERNIA REPAIR    . IR  FLUORO GUIDE PORT INSERTION RIGHT  07/11/2017  . IR US GUIDE VASC ACCESS RIGHT  07/11/2017    REVIEW OF SYSTEMS:  A comprehensive review of systems was negative except for: Respiratory: positive for dyspnea on exertion   PHYSICAL EXAMINATION: General appearance: alert, cooperative and no distress Head: Normocephalic, without obvious abnormality, atraumatic Neck: no adenopathy, no JVD, supple, symmetrical, trachea midline and thyroid not enlarged, symmetric, no tenderness/mass/nodules Lymph nodes: Cervical, supraclavicular, and axillary nodes normal. Resp: clear to auscultation bilaterally Back: symmetric, no curvature. ROM normal. No CVA tenderness. Cardio: regular rate and rhythm, S1, S2 normal, no murmur, click, rub or gallop GI: soft, non-tender; bowel sounds normal; no masses,  no organomegaly Extremities: edema 1+  ECOG PERFORMANCE STATUS: 1 - Symptomatic but completely ambulatory  Blood pressure 116/63, pulse (!) 117, temperature 98.5 F (36.9 C), temperature source Oral, resp. rate 20, height 5\' 6"  (1.676 m), weight 209 lb 3.2 oz (94.9 kg), SpO2 90 %.  LABORATORY DATA: Lab Results  Component Value Date   WBC 6.0 06/05/2018   HGB 10.6 (L) 06/05/2018   HCT 33.2 (L) 06/05/2018   MCV 76.9 (L) 06/05/2018   PLT 243 06/05/2018      Chemistry      Component Value Date/Time   NA 140 06/05/2018 0926   NA 139 20-Aug-202018 1313   K 3.6 06/05/2018 0926   K 3.8 20-Aug-202018 1313   CL 97 (L) 06/05/2018 0926   CO2 29 06/05/2018 0926   CO2 27 20-Aug-202018 1313   BUN 16 06/05/2018 0926   BUN 9.8 20-Aug-202018 1313   CREATININE 1.31 (H) 06/05/2018 0926   CREATININE 0.9 20-Aug-202018 1313      Component Value Date/Time   CALCIUM 9.1 06/05/2018 0926   CALCIUM 8.6 20-Aug-202018 1313   ALKPHOS 88 06/05/2018 0926   ALKPHOS 57 20-Aug-202018 1313   AST 13 (L) 06/05/2018 0926   AST 9 20-Aug-202018 1313   ALT <6 06/05/2018 0926   ALT 6 20-Aug-202018 1313   BILITOT 0.5 06/05/2018 0926   BILITOT 0.23  20-Aug-202018 1313       RADIOGRAPHIC STUDIES: No results found.  ASSESSMENT AND PLAN:  This is a very pleasant 62 years old African-American male recently diagnosed with a stage IIIA non-small cell lung cancer, adenocarcinoma presented with left lower lobe lung mass in addition to mediastinal lymphadenopathy.  The patient underwent a course of concurrent chemoradiation with weekly carboplatin and paclitaxel status post 7 cycles and tolerated his treatment fairly well. He is currently undergoing treatment with consolidation immunotherapy with Imfinzi (Durvalumab) status post 21 cycles.   The patient continues to tolerate this treatment well with no concerning complaints. I recommended for him to proceed with cycle #22 today as a schedule. He will come back for follow-up visit in 2 weeks for evaluation before the next cycle of his treatment. He was advised to call immediately if he has any concerning symptoms in the interval. The patient voices understanding of current disease status and treatment  options and is in agreement with the current care plan. All questions were answered. The patient knows to call the clinic with any problems, questions or concerns. We can certainly see the patient much sooner if necessary.  Disclaimer: This note was dictated with voice recognition software. Similar sounding words can inadvertently be transcribed and may not be corrected upon review.

## 2018-06-05 NOTE — Progress Notes (Signed)
Ok to treat with elevated HR per Dr. Julien Nordmann.

## 2018-06-19 ENCOUNTER — Encounter: Payer: Self-pay | Admitting: Nurse Practitioner

## 2018-06-19 ENCOUNTER — Telehealth: Payer: Self-pay | Admitting: Oncology

## 2018-06-19 ENCOUNTER — Inpatient Hospital Stay: Payer: Medicare Other

## 2018-06-19 ENCOUNTER — Inpatient Hospital Stay (HOSPITAL_BASED_OUTPATIENT_CLINIC_OR_DEPARTMENT_OTHER): Payer: Medicare Other | Admitting: Nurse Practitioner

## 2018-06-19 VITALS — BP 117/70 | HR 92 | Temp 97.5°F | Resp 18 | Ht 66.0 in | Wt 209.7 lb

## 2018-06-19 DIAGNOSIS — C3492 Malignant neoplasm of unspecified part of left bronchus or lung: Secondary | ICD-10-CM

## 2018-06-19 DIAGNOSIS — E669 Obesity, unspecified: Secondary | ICD-10-CM | POA: Diagnosis not present

## 2018-06-19 DIAGNOSIS — Z5112 Encounter for antineoplastic immunotherapy: Secondary | ICD-10-CM | POA: Diagnosis not present

## 2018-06-19 DIAGNOSIS — I2510 Atherosclerotic heart disease of native coronary artery without angina pectoris: Secondary | ICD-10-CM

## 2018-06-19 DIAGNOSIS — C3432 Malignant neoplasm of lower lobe, left bronchus or lung: Secondary | ICD-10-CM

## 2018-06-19 DIAGNOSIS — Z9981 Dependence on supplemental oxygen: Secondary | ICD-10-CM | POA: Diagnosis not present

## 2018-06-19 DIAGNOSIS — C771 Secondary and unspecified malignant neoplasm of intrathoracic lymph nodes: Secondary | ICD-10-CM

## 2018-06-19 DIAGNOSIS — R5382 Chronic fatigue, unspecified: Secondary | ICD-10-CM | POA: Diagnosis not present

## 2018-06-19 DIAGNOSIS — K8689 Other specified diseases of pancreas: Secondary | ICD-10-CM | POA: Diagnosis not present

## 2018-06-19 DIAGNOSIS — Z95828 Presence of other vascular implants and grafts: Secondary | ICD-10-CM

## 2018-06-19 LAB — CBC WITH DIFFERENTIAL/PLATELET
BASOS ABS: 0 10*3/uL (ref 0.0–0.1)
Basophils Relative: 0 %
EOS PCT: 3 %
Eosinophils Absolute: 0.1 10*3/uL (ref 0.0–0.5)
HCT: 35.5 % — ABNORMAL LOW (ref 38.4–49.9)
Hemoglobin: 10.9 g/dL — ABNORMAL LOW (ref 13.0–17.1)
LYMPHS PCT: 17 %
Lymphs Abs: 0.8 10*3/uL — ABNORMAL LOW (ref 0.9–3.3)
MCH: 23.7 pg — ABNORMAL LOW (ref 27.2–33.4)
MCHC: 30.6 g/dL — ABNORMAL LOW (ref 32.0–36.0)
MCV: 77.6 fL — AB (ref 79.3–98.0)
MONO ABS: 0.3 10*3/uL (ref 0.1–0.9)
Monocytes Relative: 7 %
Neutro Abs: 3.4 10*3/uL (ref 1.5–6.5)
Neutrophils Relative %: 73 %
Platelets: 251 10*3/uL (ref 140–400)
RBC: 4.58 MIL/uL (ref 4.20–5.82)
RDW: 19.3 % — AB (ref 11.0–14.6)
WBC: 4.7 10*3/uL (ref 4.0–10.3)

## 2018-06-19 LAB — COMPREHENSIVE METABOLIC PANEL
ALK PHOS: 90 U/L (ref 38–126)
ALT: <6 U/L (ref 0–44)
ANION GAP: 7 (ref 5–15)
AST: 8 U/L — AB (ref 15–41)
Albumin: 3.4 g/dL — ABNORMAL LOW (ref 3.5–5.0)
BILIRUBIN TOTAL: 0.3 mg/dL (ref 0.3–1.2)
BUN: 13 mg/dL (ref 8–23)
CALCIUM: 8.9 mg/dL (ref 8.9–10.3)
CO2: 32 mmol/L (ref 22–32)
Chloride: 100 mmol/L (ref 98–111)
Creatinine, Ser: 1.03 mg/dL (ref 0.61–1.24)
GFR calc Af Amer: >60 mL/min (ref >60)
GLUCOSE: 84 mg/dL (ref 70–99)
POTASSIUM: 4 mmol/L (ref 3.5–5.1)
Sodium: 139 mmol/L (ref 135–145)
Total Protein: 7.8 g/dL (ref 6.5–8.1)

## 2018-06-19 MED ORDER — SODIUM CHLORIDE 0.9% FLUSH
10.0000 mL | INTRAVENOUS | Status: DC | PRN
  Administered 2018-06-19: 10 mL via INTRAVENOUS
  Filled 2018-06-19: qty 10

## 2018-06-19 MED ORDER — HEPARIN SOD (PORK) LOCK FLUSH 100 UNIT/ML IV SOLN
500.0000 [IU] | Freq: Once | INTRAVENOUS | Status: AC | PRN
  Administered 2018-06-19: 500 [IU]
  Filled 2018-06-19: qty 5

## 2018-06-19 MED ORDER — SODIUM CHLORIDE 0.9% FLUSH
10.0000 mL | INTRAVENOUS | Status: DC | PRN
  Administered 2018-06-19: 10 mL
  Filled 2018-06-19: qty 10

## 2018-06-19 MED ORDER — SODIUM CHLORIDE 0.9 % IV SOLN
860.0000 mg | Freq: Once | INTRAVENOUS | Status: AC
  Administered 2018-06-19: 860 mg via INTRAVENOUS
  Filled 2018-06-19: qty 10

## 2018-06-19 MED ORDER — SODIUM CHLORIDE 0.9 % IV SOLN
Freq: Once | INTRAVENOUS | Status: AC
  Administered 2018-06-19: 12:00:00 via INTRAVENOUS
  Filled 2018-06-19: qty 250

## 2018-06-19 NOTE — Telephone Encounter (Signed)
Scheduled appt per 8/21 los - pt aware of appt no print out wanted.

## 2018-06-19 NOTE — Progress Notes (Signed)
[facility]  [phone] [phone]  Clinic Follow up Note   Patient Care Team: [name], MD as PCP - General (Family Medicine) Fay Records, MD as PCP - Cardiology (Cardiology) Danie Binder, MD as Consulting Physician (Gastroenterology) 06/19/2018  DIAGNOSIS: Stage IIIA (T2a, N2, M0) non-small cell lung cancer, adenocarcinoma presented with left lower lobe lung mass in addition to mediastinal lymphadenopathy diagnosed in June 2018. The patient also has suspicious soft tissue density in the pancreatic head but this has been present for 3 years on previous CT scan and unlikely to be related to [name], last dose was given 07/03/2017.  CURRENT THERAPY: Consolidation treatment with immunotherapy with Imfinzi (Durvalumab) 10 MG/KG every 2 weeks, first dose 08/15/2017.  Status post 22 cycles.  INTERVAL HISTORY: [name] returns for follow up and next cycle Imfinzi as scheduled. He completed cycle 22 on 06/05/18. He denies changes or concerns. Denies fatigue, decreased appetite, nausea, vomiting, constipation, diarrhea, fever, chills, or skin rash. Respiratory status is at baseline with productive cough. He is on 4 liters supplemental O2. No dyspnea while using O2. Denies hemoptysis or wheezing.   REVIEW OF SYSTEMS:   Constitutional: Denies fevers, chills or abnormal weight loss Ears, nose, mouth, throat, and face: Denies mucositis or sore throat Respiratory: Denies hemoptysis or wheezes (+) productive cough at baseline (+) DOE controlled on 4 liters O2 Cardiovascular: Denies palpitation, chest discomfort (+) lower extremity swelling L>R Gastrointestinal:  Denies nausea, vomiting, constipation, diarrhea, heartburn or change in bowel habits Skin: Denies abnormal skin rashes Lymphatics:  Denies new lymphadenopathy or easy bruising Neurological:Denies numbness, tingling or new weaknesses Behavioral/Psych: Mood is stable, no new changes  All other systems were reviewed with the patient and are negative.  MEDICAL HISTORY:  Past Medical History:  Diagnosis Date  . Adenocarcinoma of left lung, stage 3 ([place]) 05/10/2017  . Aortic atherosclerosis ([place]) 07/31/2017  . Asthma   . Chronic diastolic CHF (congestive heart failure) ([place])    a. 07/2017: echo showing EF of 65-70% with Grade 1 DD  . Chronic fatigue 08/02/2017  . Lung mass   . Non-small cell lung cancer ([place]) 11/13/2015  . Obesity   . Perforated bowel ([place])     SURGICAL HISTORY: Past Surgical History:  Procedure Laterality Date  . CHOLECYSTECTOMY    . COLONOSCOPY N/A 07/31/2016   Procedure: COLONOSCOPY;  Surgeon: Danie Binder, MD;  [facility];  Service: Endoscopy;  Laterality: N/A;  1:45 pm  . ESOPHAGOGASTRODUODENOSCOPY N/A 07/31/2016   Procedure: ESOPHAGOGASTRODUODENOSCOPY (EGD);  Surgeon: Danie Binder, MD;  Location[facility];  Service: Endoscopy;  Laterality: N/A;  . EUS N/A 06/07/2017   Procedure: UPPER ENDOSCOPIC ULTRASOUND (EUS) LINEAR;  Surgeon: Milus Banister, MD;  Location: [facility];  Service: Endoscopy;  Laterality: N/A;  . HERNIA REPAIR    . IR FLUORO GUIDE PORT INSERTION RIGHT  07/11/2017  . IR US GUIDE VASC ACCESS RIGHT  07/11/2017    I have reviewed the social history and family history with the patient and they are unchanged from previous note.  ALLERGIES:  is allergic to bee venom and penicillins.  MEDICATIONS:  Current Outpatient Medications  Medication Sig Dispense Refill  . aspirin EC 81 MG EC tablet Take 1 tablet (81 mg total) by mouth daily. 30 tablet 1  .  fluticasone furoate-vilanterol (BREO ELLIPTA) 200-25 MCG/INH AEPB Inhale 1 puff into the lungs daily. 1 each 5  . furosemide (LASIX) 80 MG tablet Take 1 tablet (80 mg total) by mouth daily. 90 tablet 3  .  lidocaine-prilocaine (EMLA) cream Apply 1 application topically as needed. Apply small amount to port site 1-1.5 hours before treatment. Cover with plastic wrap. 30 g 0  . OXYGEN Inhale 4 L into the lungs continuous.     . potassium chloride SA (K-DUR,KLOR-CON) 20 MEQ tablet Take 2 tablets (40 mEq total) by mouth daily. 180 tablet 3  . rivaroxaban (XARELTO) 20 MG TABS tablet Take 1 tablet (20 mg total) by mouth daily with supper. Begin 20 mg daily after starter pack is complete. 30 tablet 2  . ondansetron (ZOFRAN) 8 MG tablet Take 1 tablet (8 mg total) by mouth every 8 (eight) hours as needed for nausea or vomiting. 30 tablet 0   No current facility-administered medications for this visit.    Facility-Administered Medications Ordered in Other Visits  Medication Dose Route Frequency Provider Last Rate Last Dose  . durvalumab (IMFINZI) 860 mg in sodium chloride 0.9 % 100 mL chemo infusion  860 mg Intravenous Once Curt Bears, MD      . heparin lock flush 100 unit/mL  500 Units Intracatheter Once PRN Curt Bears, MD      . sodium chloride flush (NS) 0.9 % injection 10 mL  10 mL Intracatheter PRN Curt Bears, MD        PHYSICAL EXAMINATION: ECOG PERFORMANCE STATUS: 1 - Symptomatic but completely ambulatory  Vitals:   06/19/18 1120  BP: 117/70  Pulse: 92  Resp: 18  Temp: (!) 97.5 F (36.4 C)  SpO2: 93%   Filed Weights   06/19/18 1120  Weight: 209 lb 11.2 oz (95.1 kg)    GENERAL:alert, no distress and comfortable SKIN: skin color, texture, turgor are normal EYES:  sclera clear OROPHARYNX:no thrush or ulcers LYMPH:  no palpable cervical or supraclavicular lymphadenopathy  LUNGS: distant breath sounds, normal breathing effort HEART: regular rate & rhythm, bilateral L>R lower extremity edema ABDOMEN:abdomen soft, non-tender and normal bowel sounds Musculoskeletal:no cyanosis of digits and no clubbing  NEURO: alert & oriented x 3 with fluent speech, no focal  motor/sensory deficits PAC without erythema    LABORATORY DATA:  I have reviewed the data as listed CBC Latest Ref Rng & Units 06/19/2018 06/05/2018 05/22/2018  WBC 4.0 - 10.3 K/uL 4.7 6.0 5.2  Hemoglobin 13.0 - 17.1 g/dL 10.9(L) 10.6(L) 10.8(L)  Hematocrit 38.4 - 49.9 % 35.5(L) 33.2(L) 35.7(L)  Platelets 140 - 400 K/uL 251 243 185     CMP Latest Ref Rng & Units 06/19/2018 06/05/2018 05/22/2018  Glucose 70 - 99 mg/dL 84 106(H) 97  BUN 8 - 23 mg/dL 13 16 8   Creatinine 0.61 - 1.24 mg/dL 1.03 1.31(H) 0.97  Sodium 135 - 145 mmol/L 139 140 139  Potassium 3.5 - 5.1 mmol/L 4.0 3.6 4.0  Chloride 98 - 111 mmol/L 100 97(L) 100  CO2 22 - 32 mmol/L 32 29 31  Calcium 8.9 - 10.3 mg/dL 8.9 9.1 9.1  Total Protein 6.5 - 8.1 g/dL 7.8 8.1 7.7  Total Bilirubin 0.3 - 1.2 mg/dL 0.3 0.5 0.3  Alkaline Phos 38 - 126 U/L 90 88 98  AST 15 - 41 U/L 8(L) 13(L) 8(L)  ALT 0 - 44 U/L <6 <6 <6      RADIOGRAPHIC STUDIES: I have personally reviewed the radiological images as listed and  agreed with the findings in the report. No results found.   ASSESSMENT & PLAN: This is a very pleasant 62 years old African-American male recently diagnosed with a stage IIIA non-small cell lung cancer, adenocarcinoma presented with left lower lobe lung mass in addition to mediastinal lymphadenopathy. He completed course of concurrent chemoradiation with weekly carboplatin and paclitaxel status post 7 cycles. He is currently undergoing treatment with consolidation immunotherapy with Imfinzi (Durvalumab) status post 22 cycles. He is tolerating treatment very well overall without significant side effects. For his significant leg edema he takes lasix, I recommend he elevate his legs when resting. Labs reviewed, adequate for treatment. I recommend to proceed with cycle 23 durvalumab today. He will return for lab and f/u in 2 weeks prior to cycle 24. The plan was reviewed with Dr. Julien Nordmann.   All questions were answered. The patient knows to call  the clinic with any problems, questions or concerns. No barriers to learning was detected.     Alla Feeling, NP 06/19/18

## 2018-06-19 NOTE — Patient Instructions (Addendum)
[facility] Discharge Instructions for Patients Receiving Chemotherapy  Today you received the following chemotherapy agents durvalumab (Imfinzi).  To help prevent nausea and vomiting after your treatment, we encourage you to take your nausea medication as prescribed.    If you develop nausea and vomiting that is not controlled by your nausea medication, call the clinic.   BELOW ARE SYMPTOMS THAT SHOULD BE REPORTED IMMEDIATELY:  *FEVER GREATER THAN 100.5 F  *CHILLS WITH OR WITHOUT FEVER  NAUSEA AND VOMITING THAT IS NOT CONTROLLED WITH YOUR NAUSEA MEDICATION  *UNUSUAL SHORTNESS OF BREATH  *UNUSUAL BRUISING OR BLEEDING  TENDERNESS IN MOUTH AND THROAT WITH OR WITHOUT PRESENCE OF ULCERS  *URINARY PROBLEMS  *BOWEL PROBLEMS  UNUSUAL RASH Items with * indicate a potential emergency and should be followed up as soon as possible.  Feel free to call the clinic should you have any questions or concerns. The clinic phone number is [phone].  Please show the [facility] at check-in to the Emergency Department and triage nurse.

## 2018-06-20 DIAGNOSIS — Z0000 Encounter for general adult medical examination without abnormal findings: Secondary | ICD-10-CM | POA: Diagnosis not present

## 2018-07-02 ENCOUNTER — Other Ambulatory Visit: Payer: Self-pay | Admitting: Medical Oncology

## 2018-07-02 DIAGNOSIS — R0902 Hypoxemia: Secondary | ICD-10-CM | POA: Diagnosis not present

## 2018-07-02 DIAGNOSIS — Z9981 Dependence on supplemental oxygen: Secondary | ICD-10-CM | POA: Diagnosis not present

## 2018-07-02 DIAGNOSIS — I5032 Chronic diastolic (congestive) heart failure: Secondary | ICD-10-CM | POA: Diagnosis not present

## 2018-07-02 DIAGNOSIS — C3492 Malignant neoplasm of unspecified part of left bronchus or lung: Secondary | ICD-10-CM

## 2018-07-02 DIAGNOSIS — Z5181 Encounter for therapeutic drug level monitoring: Secondary | ICD-10-CM

## 2018-07-02 DIAGNOSIS — C3432 Malignant neoplasm of lower lobe, left bronchus or lung: Secondary | ICD-10-CM | POA: Diagnosis not present

## 2018-07-02 DIAGNOSIS — Z5112 Encounter for antineoplastic immunotherapy: Secondary | ICD-10-CM

## 2018-07-02 DIAGNOSIS — Z6834 Body mass index (BMI) 34.0-34.9, adult: Secondary | ICD-10-CM | POA: Diagnosis not present

## 2018-07-02 DIAGNOSIS — R5382 Chronic fatigue, unspecified: Secondary | ICD-10-CM

## 2018-07-02 DIAGNOSIS — Z125 Encounter for screening for malignant neoplasm of prostate: Secondary | ICD-10-CM | POA: Diagnosis not present

## 2018-07-02 DIAGNOSIS — Z7901 Long term (current) use of anticoagulants: Secondary | ICD-10-CM | POA: Diagnosis not present

## 2018-07-03 ENCOUNTER — Inpatient Hospital Stay (HOSPITAL_BASED_OUTPATIENT_CLINIC_OR_DEPARTMENT_OTHER): Payer: Medicare Other | Admitting: Oncology

## 2018-07-03 ENCOUNTER — Telehealth: Payer: Self-pay | Admitting: Adult Health

## 2018-07-03 ENCOUNTER — Encounter: Payer: Self-pay | Admitting: Oncology

## 2018-07-03 ENCOUNTER — Inpatient Hospital Stay: Payer: Medicare Other | Attending: Internal Medicine

## 2018-07-03 ENCOUNTER — Inpatient Hospital Stay: Payer: Medicare Other

## 2018-07-03 VITALS — BP 129/80 | HR 99 | Temp 97.6°F | Resp 17 | Ht 66.0 in | Wt 208.4 lb

## 2018-07-03 DIAGNOSIS — Z7982 Long term (current) use of aspirin: Secondary | ICD-10-CM | POA: Insufficient documentation

## 2018-07-03 DIAGNOSIS — Z7901 Long term (current) use of anticoagulants: Secondary | ICD-10-CM | POA: Diagnosis not present

## 2018-07-03 DIAGNOSIS — Z9981 Dependence on supplemental oxygen: Secondary | ICD-10-CM | POA: Insufficient documentation

## 2018-07-03 DIAGNOSIS — C3432 Malignant neoplasm of lower lobe, left bronchus or lung: Secondary | ICD-10-CM

## 2018-07-03 DIAGNOSIS — I5032 Chronic diastolic (congestive) heart failure: Secondary | ICD-10-CM | POA: Diagnosis not present

## 2018-07-03 DIAGNOSIS — R0602 Shortness of breath: Secondary | ICD-10-CM | POA: Insufficient documentation

## 2018-07-03 DIAGNOSIS — E669 Obesity, unspecified: Secondary | ICD-10-CM | POA: Insufficient documentation

## 2018-07-03 DIAGNOSIS — R5382 Chronic fatigue, unspecified: Secondary | ICD-10-CM | POA: Diagnosis not present

## 2018-07-03 DIAGNOSIS — Z79899 Other long term (current) drug therapy: Secondary | ICD-10-CM | POA: Insufficient documentation

## 2018-07-03 DIAGNOSIS — C771 Secondary and unspecified malignant neoplasm of intrathoracic lymph nodes: Secondary | ICD-10-CM

## 2018-07-03 DIAGNOSIS — C3492 Malignant neoplasm of unspecified part of left bronchus or lung: Secondary | ICD-10-CM

## 2018-07-03 DIAGNOSIS — Z5112 Encounter for antineoplastic immunotherapy: Secondary | ICD-10-CM | POA: Diagnosis not present

## 2018-07-03 DIAGNOSIS — Z5181 Encounter for therapeutic drug level monitoring: Secondary | ICD-10-CM

## 2018-07-03 LAB — CBC WITH DIFFERENTIAL (CANCER CENTER ONLY)
BASOS PCT: 1 %
Basophils Absolute: 0 10*3/uL (ref 0.0–0.1)
EOS ABS: 0.1 10*3/uL (ref 0.0–0.5)
Eosinophils Relative: 3 %
HCT: 36.7 % — ABNORMAL LOW (ref 38.4–49.9)
HEMOGLOBIN: 11.2 g/dL — AB (ref 13.0–17.1)
Lymphocytes Relative: 18 %
Lymphs Abs: 0.8 10*3/uL — ABNORMAL LOW (ref 0.9–3.3)
MCH: 23.9 pg — ABNORMAL LOW (ref 27.2–33.4)
MCHC: 30.6 g/dL — AB (ref 32.0–36.0)
MCV: 78 fL — ABNORMAL LOW (ref 79.3–98.0)
MONOS PCT: 10 %
Monocytes Absolute: 0.5 10*3/uL (ref 0.1–0.9)
NEUTROS PCT: 68 %
Neutro Abs: 3.1 10*3/uL (ref 1.5–6.5)
Platelet Count: 210 10*3/uL (ref 140–400)
RBC: 4.7 MIL/uL (ref 4.20–5.82)
RDW: 19.5 % — ABNORMAL HIGH (ref 11.0–14.6)
WBC: 4.5 10*3/uL (ref 4.0–10.3)

## 2018-07-03 LAB — COMPREHENSIVE METABOLIC PANEL
ALBUMIN: 3.4 g/dL — AB (ref 3.5–5.0)
ALK PHOS: 95 U/L (ref 38–126)
ALT: <6 U/L (ref 0–44)
ANION GAP: 7 (ref 5–15)
AST: 10 U/L — ABNORMAL LOW (ref 15–41)
BUN: 13 mg/dL (ref 8–23)
CHLORIDE: 102 mmol/L (ref 98–111)
CO2: 29 mmol/L (ref 22–32)
CREATININE: 1.1 mg/dL (ref 0.61–1.24)
Calcium: 9.1 mg/dL (ref 8.9–10.3)
GFR calc Af Amer: >60 mL/min (ref >60)
GFR calc non Af Amer: >60 mL/min (ref >60)
GLUCOSE: 106 mg/dL — AB (ref 70–99)
Potassium: 3.8 mmol/L (ref 3.5–5.1)
SODIUM: 138 mmol/L (ref 135–145)
Total Bilirubin: 0.4 mg/dL (ref 0.3–1.2)
Total Protein: 8 g/dL (ref 6.5–8.1)

## 2018-07-03 LAB — TSH: TSH: 0.58 u[IU]/mL (ref 0.320–4.118)

## 2018-07-03 MED ORDER — SODIUM CHLORIDE 0.9% FLUSH
10.0000 mL | Freq: Once | INTRAVENOUS | Status: AC
  Administered 2018-07-03: 10 mL
  Filled 2018-07-03: qty 10

## 2018-07-03 MED ORDER — HEPARIN SOD (PORK) LOCK FLUSH 100 UNIT/ML IV SOLN
500.0000 [IU] | Freq: Once | INTRAVENOUS | Status: AC | PRN
  Administered 2018-07-03: 500 [IU]
  Filled 2018-07-03: qty 5

## 2018-07-03 MED ORDER — SODIUM CHLORIDE 0.9 % IV SOLN
Freq: Once | INTRAVENOUS | Status: AC
  Administered 2018-07-03: 11:00:00 via INTRAVENOUS
  Filled 2018-07-03: qty 250

## 2018-07-03 MED ORDER — SODIUM CHLORIDE 0.9% FLUSH
10.0000 mL | INTRAVENOUS | Status: DC | PRN
  Administered 2018-07-03: 10 mL
  Filled 2018-07-03: qty 10

## 2018-07-03 MED ORDER — SODIUM CHLORIDE 0.9 % IV SOLN
860.0000 mg | Freq: Once | INTRAVENOUS | Status: AC
  Administered 2018-07-03: 860 mg via INTRAVENOUS
  Filled 2018-07-03: qty 10

## 2018-07-03 NOTE — Assessment & Plan Note (Addendum)
This is a very pleasant 62 year old African-American male recently diagnosed with a stage IIIA non-small cell lung cancer, adenocarcinoma presented with left lower lobe lung mass in addition to mediastinal lymphadenopathy.  The patient underwent a course of concurrent chemoradiation with weekly carboplatin and paclitaxel status post 7 cycles and tolerated his treatment fairly well. He is currently undergoing treatment with consolidation immunotherapy with Imfinzi (Durvalumab) status post 23 cycles.   The patient continues to tolerate this treatment well with no concerning complaints. I recommended for him to proceed with cycle #24 today as a scheduled. He will come back for follow-up visit in 2 weeks for evaluation before the next cycle of his treatment.  He was advised to call immediately if he has any concerning symptoms in the interval. The patient voices understanding of current disease status and treatment options and is in agreement with the current care plan. All questions were answered. The patient knows to call the clinic with any problems, questions or concerns. We can certainly see the patient much sooner if necessary.

## 2018-07-03 NOTE — Telephone Encounter (Signed)
Keep same appts per 9/4 los.

## 2018-07-03 NOTE — Patient Instructions (Signed)
[facility] Discharge Instructions for Patients Receiving Chemotherapy  Today you received the following chemotherapy agents durvalumab (Imfinzi).  To help prevent nausea and vomiting after your treatment, we encourage you to take your nausea medication as prescribed.    If you develop nausea and vomiting that is not controlled by your nausea medication, call the clinic.   BELOW ARE SYMPTOMS THAT SHOULD BE REPORTED IMMEDIATELY:  *FEVER GREATER THAN 100.5 F  *CHILLS WITH OR WITHOUT FEVER  NAUSEA AND VOMITING THAT IS NOT CONTROLLED WITH YOUR NAUSEA MEDICATION  *UNUSUAL SHORTNESS OF BREATH  *UNUSUAL BRUISING OR BLEEDING  TENDERNESS IN MOUTH AND THROAT WITH OR WITHOUT PRESENCE OF ULCERS  *URINARY PROBLEMS  *BOWEL PROBLEMS  UNUSUAL RASH Items with * indicate a potential emergency and should be followed up as soon as possible.  Feel free to call the clinic should you have any questions or concerns. The clinic phone number is [phone].  Please show the [facility] at check-in to the Emergency Department and triage nurse.

## 2018-07-03 NOTE — Progress Notes (Signed)
[facility] OFFICE PROGRESS NOTE  James Beard, MD [address] [place] [employer] [place] [address]  DIAGNOSIS:Stage IIIA (T2a, N2, M0) non-small cell lung cancer, adenocarcinoma presented with left lower lobe lung mass in addition to mediastinal lymphadenopathy diagnosed in June 2018. The patient also has suspicious soft tissue density in the pancreatic head but this has been present for 3 years on previous CT scan and unlikely to be related to [name], last dose was given 07/03/2017.  CURRENT THERAPY: Consolidation treatment with immunotherapy with Imfinzi (Durvalumab) 10 MG/KG every 2 weeks, first dose 08/15/2017. Status post 23cycles.  INTERVAL HISTORY: James Zamora 62 y.o. male returns for routine follow-up visit accompanied by his sister.  The patient is feeling fine today and has no specific complaints except for his baseline shortness of breath.  He continues to wear home oxygen.  He denies fevers and chills.  Denies chest pain, cough, hemoptysis.  Denies nausea, vomiting, constipation, diarrhea.  He has baseline lower extremity edema which is unchanged.  Denies recent weight loss or night sweats.  The patient continues to tolerate treatment with immunotherapy fairly well.  He is here for evaluation prior to cycle #24 of his treatment.  MEDICAL HISTORY: Past Medical History:  Diagnosis Date  . Adenocarcinoma of left lung, stage 3 ([facility]) 05/10/2017  . Aortic atherosclerosis ([facility]) 07/31/2017  . Asthma   . Chronic diastolic CHF (congestive heart failure) ([facility])    a. 07/2017: echo showing EF of 65-70% with Grade 1 DD  . Chronic fatigue 08/02/2017  . Lung mass   . Non-small cell lung cancer ([facility]) 11/13/2015  . Obesity   . Perforated bowel ([facility])     ALLERGIES:  is allergic to bee venom and  penicillins.  MEDICATIONS:  Current Outpatient Medications  Medication Sig Dispense Refill  . aspirin EC 81 MG EC tablet Take 1 tablet (81 mg total) by mouth daily. 30 tablet 1  . furosemide (LASIX) 80 MG tablet Take 1 tablet (80 mg total) by mouth daily. 90 tablet 3  . lidocaine-prilocaine (EMLA) cream Apply 1 application topically as needed. Apply small amount to port site 1-1.5 hours before treatment. Cover with plastic wrap. 30 g 0  . ondansetron (ZOFRAN) 8 MG tablet Take 1 tablet (8 mg total) by mouth every 8 (eight) hours as needed for nausea or vomiting. 30 tablet 0  . OXYGEN Inhale 4 L into the lungs continuous.     . potassium chloride SA (K-DUR,KLOR-CON) 20 MEQ tablet Take 2 tablets (40 mEq total) by mouth daily. 180 tablet 3  . rivaroxaban (XARELTO) 20 MG TABS tablet Take 1 tablet (20 mg total) by mouth daily with supper. Begin 20 mg daily after starter pack is complete. 30 tablet 2  . fluticasone furoate-vilanterol (BREO ELLIPTA) 200-25 MCG/INH AEPB Inhale 1 puff into the lungs daily. (Patient not taking: Reported on 07/03/2018) 1 each 5   No current facility-administered medications for this visit.     SURGICAL HISTORY:  Past Surgical History:  Procedure Laterality Date  . CHOLECYSTECTOMY    . COLONOSCOPY N/A 07/31/2016   Procedure: COLONOSCOPY;  Surgeon: Danie Binder, MD;  [facility];  Service: Endoscopy;  Laterality: N/A;  1:45 pm  . ESOPHAGOGASTRODUODENOSCOPY N/A 07/31/2016   Procedure: ESOPHAGOGASTRODUODENOSCOPY (EGD);  Surgeon: Danie Binder, MD;  Location[facility];  Service: Endoscopy;  Laterality: N/A;  . EUS N/A 06/07/2017   Procedure: UPPER ENDOSCOPIC ULTRASOUND (EUS) LINEAR;  Surgeon: Milus Banister, MD;  [facility];  Service: Endoscopy;  Laterality: N/A;  . HERNIA REPAIR    . IR FLUORO GUIDE PORT INSERTION RIGHT  07/11/2017  . IR US GUIDE VASC ACCESS RIGHT  07/11/2017    REVIEW OF SYSTEMS:   Review of Systems  Constitutional:  Negative for appetite change, chills, fatigue, fever and unexpected weight change.  HENT:   Negative for mouth sores, nosebleeds, sore throat and trouble swallowing.   Eyes: Negative for eye problems and icterus.  Respiratory: Negative for cough, hemoptysis, and wheezing.  He has baseline shortness of breath and wears home oxygen. Cardiovascular: Negative for chest pain.  Positive for baseline lower extremity edema. Gastrointestinal: Negative for abdominal pain, constipation, diarrhea, nausea and vomiting.  Genitourinary: Negative for bladder incontinence, difficulty urinating, dysuria, frequency and hematuria.   Musculoskeletal: Negative for back pain, gait problem, neck pain and neck stiffness.  Skin: Negative for itching and rash.  Neurological: Negative for dizziness, extremity weakness, gait problem, headaches, light-headedness and seizures.  Hematological: Negative for adenopathy. Does not bruise/bleed easily.  Psychiatric/Behavioral: Negative for confusion, depression and sleep disturbance. The patient is not nervous/anxious.     PHYSICAL EXAMINATION:  Blood pressure 129/80, pulse 99, temperature 97.6 F (36.4 C), temperature source Oral, resp. rate 17, height 5\' 6"  (1.676 m), weight 208 lb 6.4 oz (94.5 kg), SpO2 95 %.  ECOG PERFORMANCE STATUS: 1 - Symptomatic but completely ambulatory  Physical Exam  Constitutional: Oriented to person, place, and time and well-developed, well-nourished, and in no distress. No distress.  HENT:  Head: Normocephalic and atraumatic.  Mouth/Throat: Oropharynx is clear and moist. No oropharyngeal exudate.  Eyes: Conjunctivae are normal. Right eye exhibits no discharge. Left eye exhibits no discharge. No scleral icterus.  Neck: Normal range of motion. Neck supple.  Cardiovascular: Normal rate, regular rhythm, normal heart sounds and intact distal pulses.  1+ bilateral lower extremity edema. Pulmonary/Chest: Effort normal and breath sounds normal. No  respiratory distress. No wheezes. No rales.  Abdominal: Soft. Bowel sounds are normal. Exhibits no distension and no mass. There is no tenderness.  Musculoskeletal: Normal range of motion.  Lymphadenopathy:    No cervical adenopathy.  Neurological: Alert and oriented to person, place, and time. Exhibits normal muscle tone. Gait normal. Coordination normal.  Skin: Skin is warm and dry. No rash noted. Not diaphoretic. No erythema. No pallor.  Psychiatric: Mood, memory and judgment normal.  Vitals reviewed.  LABORATORY DATA: Lab Results  Component Value Date   WBC 4.5 07/03/2018   HGB 11.2 (L) 07/03/2018   HCT 36.7 (L) 07/03/2018   MCV 78.0 (L) 07/03/2018   PLT 210 07/03/2018      Chemistry      Component Value Date/Time   NA 139 06/19/2018 1035   NA 139 07-19-2017 1313   K 4.0 06/19/2018 1035   K 3.8 07-19-2017 1313   CL 100 06/19/2018 1035   CO2 32 06/19/2018 1035   CO2 27 07-19-2017 1313   BUN 13 06/19/2018 1035   BUN 9.8 07-19-2017 1313   CREATININE 1.03 06/19/2018 1035   CREATININE 0.9 07-19-2017 1313      Component Value Date/Time   CALCIUM 8.9 06/19/2018 1035   CALCIUM 8.6 07-19-2017 1313   ALKPHOS 90 06/19/2018 1035   ALKPHOS 57 07-19-2017 1313   AST 8 (L) 06/19/2018 1035   AST 9  December 17, 202018 1313   ALT <6 06/19/2018 1035   ALT 6 December 17, 202018 1313   BILITOT 0.3 06/19/2018 1035   BILITOT 0.23 December 17, 202018 1313       RADIOGRAPHIC STUDIES:  No results found.   ASSESSMENT/PLAN:  Adenocarcinoma of left lung, stage 3 ([facility]) This is a very pleasant 62 year old African-American male recently diagnosed with a stage IIIA non-small cell lung cancer, adenocarcinoma presented with left lower lobe lung mass in addition to mediastinal lymphadenopathy.  The patient underwent a course of concurrent chemoradiation with weekly carboplatin and paclitaxel status post 7 cycles and tolerated his treatment fairly well. He is currently undergoing treatment with consolidation  immunotherapy with Imfinzi (Durvalumab) status post 23 cycles.   The patient continues to tolerate this treatment well with no concerning complaints. I recommended for him to proceed with cycle #24 today as a scheduled. He will come back for follow-up visit in 2 weeks for evaluation before the next cycle of his treatment.  He was advised to call immediately if he has any concerning symptoms in the interval. The patient voices understanding of current disease status and treatment options and is in agreement with the current care plan. All questions were answered. The patient knows to call the clinic with any problems, questions or concerns. We can certainly see the patient much sooner if necessary.   Orders Placed This Encounter  Procedures  . CBC with Differential (Cancer Center Only)    Standing Status:   Standing    Number of Occurrences:   20    Standing Expiration Date:   07/04/2019  . CMP ([facility] only)    Standing Status:   Standing    Number of Occurrences:   20    Standing Expiration Date:   07/04/2019  . TSH    Standing Status:   Standing    Number of Occurrences:   12    Standing Expiration Date:   07/04/2019     Mikey Bussing, DNP, AGPCNP-BC, AOCNP 07/03/18

## 2018-07-04 ENCOUNTER — Ambulatory Visit (INDEPENDENT_AMBULATORY_CARE_PROVIDER_SITE_OTHER): Payer: Medicare Other | Admitting: Internal Medicine

## 2018-07-04 ENCOUNTER — Encounter: Payer: Self-pay | Admitting: Internal Medicine

## 2018-07-04 VITALS — BP 118/70 | HR 94 | Ht 66.0 in | Wt 210.0 lb

## 2018-07-04 DIAGNOSIS — I5031 Acute diastolic (congestive) heart failure: Secondary | ICD-10-CM

## 2018-07-04 DIAGNOSIS — C3432 Malignant neoplasm of lower lobe, left bronchus or lung: Secondary | ICD-10-CM | POA: Diagnosis not present

## 2018-07-04 MED ORDER — POTASSIUM CHLORIDE CRYS ER 20 MEQ PO TBCR: EXTENDED_RELEASE_TABLET | ORAL | 3 refills | Status: DC

## 2018-07-04 MED ORDER — METOLAZONE 2.5 MG PO TABS: ORAL_TABLET | ORAL | 3 refills | Status: DC

## 2018-07-04 NOTE — Progress Notes (Signed)
Cardiology Office Note   Date:  07/04/2018   ID:  James Zamora, DOB 11-Jun-1956, MRN [id number]  PCP:  [name], MD  Cardiologist:   [name], MD   Pt presents for f/u of disatolic CHF     History of Present Illness   James Zamora is a 62 y.o. male with a history of dyspnea, HTN COPD, diastoli CHF, Stage III adenocarcinoma of the lung (undergoing consolidation Rx) Hx of PE   and   sleep apnea   I saw him in April 2019    Volume appeared up at time  Since I saw him he has been seen twice by B [facility]rader in clinic   Last visit was May 30th  From these visit it was felt that pt was not being compliant with Rx and diet   The pt comes in today using 4 L oxygen   He says his breathing is OK   He denies CP   Says his ankles go up and down some    Current Meds  Medication Sig  . aspirin EC 81 MG EC tablet Take 1 tablet (81 mg total) by mouth daily.  . fluticasone furoate-vilanterol (BREO ELLIPTA) 200-25 MCG/INH AEPB Inhale 1 puff into the lungs daily.  . furosemide (LASIX) 80 MG tablet Take 1 tablet (80 mg total) by mouth daily.  Marland Kitchen lidocaine-prilocaine (EMLA) cream Apply 1 application topically as needed. Apply small amount to port site 1-1.5 hours before treatment. Cover with plastic wrap.  . ondansetron (ZOFRAN) 8 MG tablet Take 1 tablet (8 mg total) by mouth every 8 (eight) hours as needed for nausea or vomiting.  . OXYGEN Inhale 4 L into the lungs continuous.   . potassium chloride SA (K-DUR,KLOR-CON) 20 MEQ tablet Take 2 tablets (40 mEq total) by mouth daily.  . rivaroxaban (XARELTO) 20 MG TABS tablet Take 1 tablet (20 mg total) by mouth daily with supper. Begin 20 mg daily after starter pack is complete.     Allergies:   Bee venom and Penicillins   Past Medical History:  Diagnosis Date  . Adenocarcinoma of left lung, stage 3 ([place]) 05/10/2017  . Aortic atherosclerosis ([place]) 07/31/2017  . Asthma   . Chronic diastolic CHF (congestive heart failure) ([place])    a.  07/2017: echo showing EF of 65-70% with Grade 1 DD  . Chronic fatigue 08/02/2017  . Lung mass   . Non-small cell lung cancer ([place]) 11/13/2015  . Obesity   . Perforated bowel [facility])     Past Surgical History:  Procedure Laterality Date  . CHOLECYSTECTOMY    . COLONOSCOPY N/A 07/31/2016   Procedure: COLONOSCOPY;  Surgeon: Danie Binder, MD;  [facility];  Service: Endoscopy;  Laterality: N/A;  1:45 pm  . ESOPHAGOGASTRODUODENOSCOPY N/A 07/31/2016   Procedure: ESOPHAGOGASTRODUODENOSCOPY (EGD);  Surgeon: Danie Binder, MD;  Location: [facility];  Service: Endoscopy;  Laterality: N/A;  . EUS N/A 06/07/2017   Procedure: UPPER ENDOSCOPIC ULTRASOUND (EUS) LINEAR;  Surgeon: Milus Banister, MD;  Location[facility];  Service: Endoscopy;  Laterality: N/A;  . HERNIA REPAIR    . IR FLUORO GUIDE PORT INSERTION RIGHT  07/11/2017  . IR US GUIDE VASC ACCESS RIGHT  07/11/2017     Social History:  The patient  reports that he quit smoking about 5 years ago. His smoking use included cigarettes. He has a 20.00 pack-year smoking history. He quit smokeless tobacco use about 3 years ago.  His  smokeless tobacco use included chew. He reports that he does not drink alcohol or use drugs.   Family History:  The patient's family history includes Asthma in his father; Diabetes in his mother; Hypertension in his father and mother; Prostate cancer in his father.    ROS:  Please see the history of present illness. All other systems are reviewed and  Negative to the above problem except as noted.    PHYSICAL EXAM: VS:  BP 118/70 (BP Location: Left Arm)   Pulse 94   Ht 5\' 6"  (1.676 m)   Wt 210 lb (95.3 kg)   SpO2 (!) 89% Comment: on 4 L o2  BMI 33.89 kg/m   Sats 79 % with walking (on 4 L)   Patient denies SOB   GEN: PT is quiet 62 yo , in no acute distress  HEENT: normal  Neck: JVP is normal  No carotid bruits, or masses Cardiac: RRR; no murmurs, rubs, or gallops, 2+ LE edema     Respiratory:   Rhonchi bilater  Decreased BS at bases  Overall decreased airflow   GI: soft, nontender, nondistended, + BS  No hepatomegaly  MS: no deformity Moving all extremities   Skin: warm and dry, no rash Neuro:  Strength and sensation are intact Psych: euthymic mood, full affect   EKG:  EKG is not ordered today.   Lipid Panel No results found for: CHOL, TRIG, HDL, CHOLHDL, VLDL, LDLCALC, LDLDIRECT    Wt Readings from Last 3 Encounters:  07/04/18 210 lb (95.3 kg)  07/03/18 208 lb 6.4 oz (94.5 kg)  06/19/18 209 lb 11.2 oz (95.1 kg)      ASSESSMENT AND PLAN:  1  Acute on chronic diaslic CHF    Volume is up on exam, but he is comfortable  At rst   Sats with walking are low     Discussed with him the importance of low salt diet I would recomm add metalozone 2.5 mg to Lasix 2x per week   WOuld follow up with me in clinic on 9/17  2  Dyspnea   O2 saturation is not good  With activity   He is in no acute distess    Will try to slowly increase diuresis F/U as noted above soon.  3   Lung CA   Followed in oncology clinic   4  HTN   BP is OK     5  Hx PE   Continue Xarelto    Current medicines are reviewed at length with the patient today.  The patient does not have concerns regarding medicines.  Signed, [name], MD  07/04/2018 2:00 PM    [address], [place], [place]  [address] Phone: [phone]; Fax: [phone]   53

## 2018-07-04 NOTE — Patient Instructions (Addendum)
Your physician recommends that you schedule a follow-up appointment in: on 9/17 at 1 pm with Dr.Ross    On Mondays and Thursdays take Metolazone 2.5 mg thirty minutes before your Lasix  On Mondays and Thursdays take an extra potassium pill, you will take 3 tablets        No lab work or tests today.      Thank you for choosing [facility] !

## 2018-07-16 ENCOUNTER — Ambulatory Visit (INDEPENDENT_AMBULATORY_CARE_PROVIDER_SITE_OTHER): Payer: Medicare Other | Admitting: Internal Medicine

## 2018-07-16 ENCOUNTER — Encounter: Payer: Self-pay | Admitting: Internal Medicine

## 2018-07-16 VITALS — BP 110/78 | HR 106 | Ht 66.0 in | Wt 205.0 lb

## 2018-07-16 DIAGNOSIS — I5031 Acute diastolic (congestive) heart failure: Secondary | ICD-10-CM

## 2018-07-16 DIAGNOSIS — Z79899 Other long term (current) drug therapy: Secondary | ICD-10-CM | POA: Diagnosis not present

## 2018-07-16 DIAGNOSIS — J410 Simple chronic bronchitis: Secondary | ICD-10-CM

## 2018-07-16 DIAGNOSIS — I10 Essential (primary) hypertension: Secondary | ICD-10-CM | POA: Diagnosis not present

## 2018-07-16 NOTE — Progress Notes (Signed)
Cardiology Office Note   Date:  07/16/2018   ID:  James Zamora, DOB January 17, 1956, MRN [id number]  PCP:  [name], MD  Cardiologist:   [name], MD   Pt presents for f/u of disatolic CHF     History of Present Illness   James Zamora is a 62 y.o. male with a history of dyspnea, HTN COPD, diastoli CHF, Stage III adenocarcinoma of the lung (undergoing consolidation Rx) Hx of PE   and   sleep apnea   I saw the patient in clinic earlier this month   He appeared volume overloaded    Oxygen went down with walking    I recomm that he add Zaroxylyn 2.5 mg 2x per wk to his regimen   On those days I recomm he increase KCL to 3 tabs per day  The pt just started metalozone this week   Took his first pill yesterday AM   Took at same time as lasix   He says he urinated more  Breathing is stable   He denies CP     Current Meds  Medication Sig  . aspirin EC 81 MG EC tablet Take 1 tablet (81 mg total) by mouth daily.  . fluticasone furoate-vilanterol (BREO ELLIPTA) 200-25 MCG/INH AEPB Inhale 1 puff into the lungs daily.  . furosemide (LASIX) 80 MG tablet Take 1 tablet (80 mg total) by mouth daily.  Marland Kitchen lidocaine-prilocaine (EMLA) cream Apply 1 application topically as needed. Apply small amount to port site 1-1.5 hours before treatment. Cover with plastic wrap.  . metolazone (ZAROXOLYN) 2.5 MG tablet Take 2.5 mg 2 times a week, take on Monday and Thursday  . ondansetron (ZOFRAN) 8 MG tablet Take 1 tablet (8 mg total) by mouth every 8 (eight) hours as needed for nausea or vomiting.  . OXYGEN Inhale 4 L into the lungs continuous.   . potassium chloride SA (K-DUR,KLOR-CON) 20 MEQ tablet On Monday and Thursday, take 3 tablets (60 mg) all other days take 2 tablets (40 meq)  . rivaroxaban (XARELTO) 20 MG TABS tablet Take 1 tablet (20 mg total) by mouth daily with supper. Begin 20 mg daily after starter pack is complete.     Allergies:   Bee venom and Penicillins   Past Medical  History:  Diagnosis Date  . Adenocarcinoma of left lung, stage 3 ([place]) 05/10/2017  . Aortic atherosclerosis ([place]) 07/31/2017  . Asthma   . Chronic diastolic CHF (congestive heart failure) ([place])    a. 07/2017: echo showing EF of 65-70% with Grade 1 DD  . Chronic fatigue 08/02/2017  . Lung mass   . Non-small cell lung cancer ([place]) 11/13/2015  . Obesity   . Perforated bowel [facility])     Past Surgical History:  Procedure Laterality Date  . CHOLECYSTECTOMY    . COLONOSCOPY N/A 07/31/2016   Procedure: COLONOSCOPY;  Surgeon: Danie Binder, MD;  [facility];  Service: Endoscopy;  Laterality: N/A;  1:45 pm  . ESOPHAGOGASTRODUODENOSCOPY N/A 07/31/2016   Procedure: ESOPHAGOGASTRODUODENOSCOPY (EGD);  Surgeon: Danie Binder, MD;  [facility];  Service: Endoscopy;  Laterality: N/A;  . EUS N/A 06/07/2017   Procedure: UPPER ENDOSCOPIC ULTRASOUND (EUS) LINEAR;  Surgeon: Milus Banister, MD;  [facility];  Service: Endoscopy;  Laterality: N/A;  . HERNIA REPAIR    . IR FLUORO GUIDE PORT INSERTION RIGHT  07/11/2017  . IR US GUIDE VASC ACCESS RIGHT  07/11/2017     Social History:  The patient  reports that he quit smoking about 6 years ago. His smoking use included cigarettes. He has a 20.00 pack-year smoking history. He quit smokeless tobacco use about 4 years ago.  His smokeless tobacco use included chew. He reports that he does not drink alcohol or use drugs.   Family History:  The patient's family history includes Asthma in his father; Diabetes in his mother; Hypertension in his father and mother; Prostate cancer in his father.    ROS:  Please see the history of present illness. All other systems are reviewed and  Negative to the above problem except as noted.    PHYSICAL EXAM: VS:  BP 110/78   Pulse (!) 106   Ht 5\' 6"  (1.676 m)   Wt 205 lb (93 kg)   SpO2 90%   BMI 33.09 kg/m   Sats 79 % with walking (on 4 L)   Patient denies SOB   GEN: PT is quiet 62 yo , in no  acute distress  HEENT: normal  Neck: JVP is normal  No carotid bruits, or masses Cardiac: RRR; no murmurs, rubs, or gallops, 1-2+ LE edema     Respiratory:  Rhonchi bilater  Decreased BS at bases  GI: soft, nontender, nondistended, + BS  No hepatomegaly  MS: no deformity Moving all extremities   Skin: warm and dry, no rash Neuro:  Strength and sensation are intact Psych: euthymic mood, full affect   EKG:  EKG is not ordered today.   Lipid Panel No results found for: CHOL, TRIG, HDL, CHOLHDL, VLDL, LDLCALC, LDLDIRECT    Wt Readings from Last 3 Encounters:  07/16/18 205 lb (93 kg)  07/04/18 210 lb (95.3 kg)  07/03/18 208 lb 6.4 oz (94.5 kg)      ASSESSMENT AND PLAN:  1  Acute on chronic diaslic CHF    Volume is up on exam,   May be a little better   He just started Zaroxylyn yesterday   And took at same time as other meds   Recom:   I would continue   Takie it 2 x per wk   Take 30 min before lasix  Instructed also to take 3 tabs KCL on those days Watch for salt   Needs Low Na diet  F/U BMET and BNP next Tuesday   F/U in clinic 1 month   2  Dyspnea   Stable  O2 saturation is not good  With activity   He is in no acute distess  Goal is to diurese    Continue O2    3   Lung CA   Followed in oncology clinic   4  HTN   BP is OK     5  Hx PE   Continue Xarelto    Current medicines are reviewed at length with the patient today.  The patient does not have concerns regarding medicines.  Signed, [name], MD  07/16/2018 1:23 PM    [facility] [address], [place], [place]  [address] Phone: [phone]; Fax: [phone]

## 2018-07-16 NOTE — Patient Instructions (Signed)
Your physician recommends that you schedule a follow-up appointment in: 1 month with Bernerd Pho PA-C   Get lab work in 1 week 9/24   Your physician recommends that you continue on your current medications as directed. Please refer to the Current Medication list given to you today.   If you need a refill on your cardiac medications before your next appointment, please call your pharmacy.     No testing ordered today.     Thank you for choosing [facility] !

## 2018-07-17 ENCOUNTER — Inpatient Hospital Stay: Payer: Medicare Other

## 2018-07-17 ENCOUNTER — Inpatient Hospital Stay (HOSPITAL_BASED_OUTPATIENT_CLINIC_OR_DEPARTMENT_OTHER): Payer: Medicare Other | Admitting: Oncology

## 2018-07-17 ENCOUNTER — Encounter: Payer: Self-pay | Admitting: Oncology

## 2018-07-17 VITALS — BP 112/75 | HR 100 | Temp 97.6°F | Resp 18 | Ht 66.0 in | Wt 206.3 lb

## 2018-07-17 VITALS — HR 88

## 2018-07-17 DIAGNOSIS — C3432 Malignant neoplasm of lower lobe, left bronchus or lung: Secondary | ICD-10-CM | POA: Diagnosis not present

## 2018-07-17 DIAGNOSIS — R5382 Chronic fatigue, unspecified: Secondary | ICD-10-CM

## 2018-07-17 DIAGNOSIS — I5032 Chronic diastolic (congestive) heart failure: Secondary | ICD-10-CM | POA: Diagnosis not present

## 2018-07-17 DIAGNOSIS — C771 Secondary and unspecified malignant neoplasm of intrathoracic lymph nodes: Secondary | ICD-10-CM

## 2018-07-17 DIAGNOSIS — Z7901 Long term (current) use of anticoagulants: Secondary | ICD-10-CM | POA: Diagnosis not present

## 2018-07-17 DIAGNOSIS — Z5112 Encounter for antineoplastic immunotherapy: Secondary | ICD-10-CM | POA: Diagnosis not present

## 2018-07-17 DIAGNOSIS — R0602 Shortness of breath: Secondary | ICD-10-CM | POA: Diagnosis not present

## 2018-07-17 DIAGNOSIS — E669 Obesity, unspecified: Secondary | ICD-10-CM | POA: Diagnosis not present

## 2018-07-17 DIAGNOSIS — C3492 Malignant neoplasm of unspecified part of left bronchus or lung: Secondary | ICD-10-CM

## 2018-07-17 LAB — CMP (CANCER CENTER ONLY)
ALT: <6 U/L (ref 0–44)
AST: 13 U/L — ABNORMAL LOW (ref 15–41)
Albumin: 3.8 g/dL (ref 3.5–5.0)
Alkaline Phosphatase: 106 U/L (ref 38–126)
Anion gap: 10 (ref 5–15)
BUN: 19 mg/dL (ref 8–23)
CO2: 30 mmol/L (ref 22–32)
Calcium: 10.2 mg/dL (ref 8.9–10.3)
Chloride: 99 mmol/L (ref 98–111)
Creatinine: 1.3 mg/dL — ABNORMAL HIGH (ref 0.61–1.24)
GFR, Est AFR Am: >60 mL/min (ref >60)
GFR, Estimated: 57 mL/min — ABNORMAL LOW (ref >60)
Glucose, Bld: 98 mg/dL (ref 70–99)
Potassium: 4.3 mmol/L (ref 3.5–5.1)
Sodium: 139 mmol/L (ref 135–145)
Total Bilirubin: 0.5 mg/dL (ref 0.3–1.2)
Total Protein: 8.8 g/dL — ABNORMAL HIGH (ref 6.5–8.1)

## 2018-07-17 LAB — CBC WITH DIFFERENTIAL (CANCER CENTER ONLY)
Basophils Absolute: 0 10*3/uL (ref 0.0–0.1)
Basophils Relative: 1 %
Eosinophils Absolute: 0.2 10*3/uL (ref 0.0–0.5)
Eosinophils Relative: 3 %
HEMATOCRIT: 38.2 % — AB (ref 38.4–49.9)
Hemoglobin: 12.2 g/dL — ABNORMAL LOW (ref 13.0–17.1)
LYMPHS ABS: 1.1 10*3/uL (ref 0.9–3.3)
Lymphocytes Relative: 19 %
MCH: 24.7 pg — AB (ref 27.2–33.4)
MCHC: 31.9 g/dL — ABNORMAL LOW (ref 32.0–36.0)
MCV: 77.5 fL — AB (ref 79.3–98.0)
MONO ABS: 0.5 10*3/uL (ref 0.1–0.9)
Monocytes Relative: 9 %
NEUTROS ABS: 3.9 10*3/uL (ref 1.5–6.5)
Neutrophils Relative %: 68 %
Platelet Count: 227 10*3/uL (ref 140–400)
RBC: 4.93 MIL/uL (ref 4.20–5.82)
RDW: 18.5 % — ABNORMAL HIGH (ref 11.0–14.6)
WBC Count: 5.7 10*3/uL (ref 4.0–10.3)

## 2018-07-17 MED ORDER — SODIUM CHLORIDE 0.9% FLUSH
10.0000 mL | INTRAVENOUS | Status: DC | PRN
  Administered 2018-07-17: 10 mL
  Filled 2018-07-17: qty 10

## 2018-07-17 MED ORDER — ONDANSETRON HCL 4 MG/2ML IJ SOLN
INTRAMUSCULAR | Status: AC
  Filled 2018-07-17: qty 4

## 2018-07-17 MED ORDER — SODIUM CHLORIDE 0.9 % IV SOLN
Freq: Once | INTRAVENOUS | Status: AC
  Administered 2018-07-17: 10:00:00 via INTRAVENOUS
  Filled 2018-07-17: qty 250

## 2018-07-17 MED ORDER — HEPARIN SOD (PORK) LOCK FLUSH 100 UNIT/ML IV SOLN
500.0000 [IU] | Freq: Once | INTRAVENOUS | Status: AC | PRN
  Administered 2018-07-17: 500 [IU]
  Filled 2018-07-17: qty 5

## 2018-07-17 MED ORDER — SODIUM CHLORIDE 0.9 % IV SOLN
860.0000 mg | Freq: Once | INTRAVENOUS | Status: AC
  Administered 2018-07-17: 860 mg via INTRAVENOUS
  Filled 2018-07-17: qty 7.2

## 2018-07-17 MED ORDER — SODIUM CHLORIDE 0.9% FLUSH
10.0000 mL | Freq: Once | INTRAVENOUS | Status: AC
  Administered 2018-07-17: 10 mL
  Filled 2018-07-17: qty 10

## 2018-07-17 MED ORDER — ONDANSETRON HCL 4 MG/2ML IJ SOLN
8.0000 mg | Freq: Once | INTRAMUSCULAR | Status: AC
  Administered 2018-07-17: 8 mg via INTRAVENOUS

## 2018-07-17 NOTE — Progress Notes (Signed)
James Zamora OFFICE PROGRESS NOTE  James Beard, MD [address] [place] [address]  DIAGNOSIS:Stage IIIA (T2a, N2, M0) non-small cell lung cancer, adenocarcinoma presented with left lower lobe lung mass in addition to mediastinal lymphadenopathy diagnosed in June 2018. The patient also has suspicious soft tissue density in the pancreatic head but this has been present for 3 years on previous CT scan and unlikely to be related to [name], last dose was given 07/03/2017.  CURRENT THERAPY: Consolidation treatment with immunotherapy with Imfinzi (Durvalumab) 10 MG/KG every 2 weeks, first dose 08/15/2017. Status post 24cycles.  INTERVAL HISTORY: James Zamora 62 y.o. male returns for routine follow-up visit by himself.  The patient is feeling fine today and has no specific complaints.  He was recently seen by cardiology and given Zaroxolyn 2 days each week.  On these days he takes 3 tablets of potassium.  He reports that his edema is somewhat better.  He denies fevers and chills.  Denies chest pain, cough, hemoptysis.  He has baseline shortness of breath and wears home oxygen.  Denies nausea, vomiting, constipation, diarrhea.  Denies rashes.  The patient is here for evaluation prior to cycle #25 of his treatment.  MEDICAL HISTORY: Past Medical History:  Diagnosis Date  . Adenocarcinoma of left lung, stage 3 ([place]) 05/10/2017  . Aortic atherosclerosis ([place]) 07/31/2017  . Asthma   . Chronic diastolic CHF (congestive heart failure) ([place])    a. 07/2017: echo showing EF of 65-70% with Grade 1 DD  . Chronic fatigue 08/02/2017  . Lung mass   . Non-small cell lung cancer ([place]) 11/13/2015  . Obesity   . Perforated bowel ([place])     ALLERGIES:  is allergic to bee venom and penicillins.  MEDICATIONS:  Current  Outpatient Medications  Medication Sig Dispense Refill  . aspirin EC 81 MG EC tablet Take 1 tablet (81 mg total) by mouth daily. 30 tablet 1  . fluticasone furoate-vilanterol (BREO ELLIPTA) 200-25 MCG/INH AEPB Inhale 1 puff into the lungs daily. 1 each 5  . furosemide (LASIX) 80 MG tablet Take 1 tablet (80 mg total) by mouth daily. 90 tablet 3  . lidocaine-prilocaine (EMLA) cream Apply 1 application topically as needed. Apply small amount to port site 1-1.5 hours before treatment. Cover with plastic wrap. 30 g 0  . metolazone (ZAROXOLYN) 2.5 MG tablet Take 2.5 mg 2 times a week, take on Monday and Thursday 24 tablet 3  . ondansetron (ZOFRAN) 8 MG tablet Take 1 tablet (8 mg total) by mouth every 8 (eight) hours as needed for nausea or vomiting. 30 tablet 0  . OXYGEN Inhale 4 L into the lungs continuous.     . potassium chloride SA (K-DUR,KLOR-CON) 20 MEQ tablet On Monday and Thursday, take 3 tablets (60 mg) all other days take 2 tablets (40 meq) 180 tablet 3  . rivaroxaban (XARELTO) 20 MG TABS tablet Take 1 tablet (20 mg total) by mouth daily with supper. Begin 20 mg daily after starter pack is complete. 30 tablet 2   No current facility-administered medications for this visit.    Facility-Administered Medications Ordered in Other Visits  Medication Dose Route Frequency Provider Last Rate Last Dose  . durvalumab (IMFINZI) 880 mg in sodium chloride 0.9 % 100 mL chemo infusion  10 mg/kg (Treatment Plan Recorded) Intravenous  Once Curt Bears, MD      . heparin lock flush 100 unit/mL  500 Units Intracatheter Once PRN Curt Bears, MD      . sodium chloride flush (NS) 0.9 % injection 10 mL  10 mL Intracatheter PRN Curt Bears, MD        SURGICAL HISTORY:  Past Surgical History:  Procedure Laterality Date  . CHOLECYSTECTOMY    . COLONOSCOPY N/A 07/31/2016   Procedure: COLONOSCOPY;  Surgeon: Danie Binder, MD;  [facility];  Service: Endoscopy;  Laterality: N/A;  1:45 pm   . ESOPHAGOGASTRODUODENOSCOPY N/A 07/31/2016   Procedure: ESOPHAGOGASTRODUODENOSCOPY (EGD);  Surgeon: Danie Binder, MD;  [facility];  Service: Endoscopy;  Laterality: N/A;  . EUS N/A 06/07/2017   Procedure: UPPER ENDOSCOPIC ULTRASOUND (EUS) LINEAR;  Surgeon: Milus Banister, MD;  [facility];  Service: Endoscopy;  Laterality: N/A;  . HERNIA REPAIR    . IR FLUORO GUIDE PORT INSERTION RIGHT  07/11/2017  . IR US GUIDE VASC ACCESS RIGHT  07/11/2017    REVIEW OF SYSTEMS:   Review of Systems  Constitutional: Negative for appetite change, chills, fatigue, fever and unexpected weight change.  HENT:   Negative for mouth sores, nosebleeds, sore throat and trouble swallowing.   Eyes: Negative for eye problems and icterus.  Respiratory: Negative for cough, hemoptysis, and wheezing.  Positive for his baseline shortness of breath.  He wears home oxygen. Cardiovascular: Negative for chest pain.  Positive for lower extremity edema. Gastrointestinal: Negative for abdominal pain, constipation, diarrhea, nausea and vomiting.  Genitourinary: Negative for bladder incontinence, difficulty urinating, dysuria, frequency and hematuria.   Musculoskeletal: Negative for back pain, gait problem, neck pain and neck stiffness.  Skin: Negative for itching and rash.  Neurological: Negative for dizziness, extremity weakness, gait problem, headaches, light-headedness and seizures.  Hematological: Negative for adenopathy. Does not bruise/bleed easily.  Psychiatric/Behavioral: Negative for confusion, depression and sleep disturbance. The patient is not nervous/anxious.     PHYSICAL EXAMINATION:  Blood pressure 112/75, pulse 100, temperature 97.6 F (36.4 C), temperature source Oral, resp. rate 18, height 5\' 6"  (1.676 m), weight 206 lb 4.8 oz (93.6 kg), SpO2 96 %.  ECOG PERFORMANCE STATUS: 1 - Symptomatic but completely ambulatory  Physical Exam  Constitutional: Oriented to person, place, and time  and well-developed, well-nourished, and in no distress. No distress.  HENT:  Head: Normocephalic and atraumatic.  Mouth/Throat: Oropharynx is clear and moist. No oropharyngeal exudate.  Eyes: Conjunctivae are normal. Right eye exhibits no discharge. Left eye exhibits no discharge. No scleral icterus.  Neck: Normal range of motion. Neck supple.  Cardiovascular: Normal rate, regular rhythm, normal heart sounds and intact distal pulses.  1+ bilateral lower extremity edema. Pulmonary/Chest: Effort normal and breath sounds normal. No respiratory distress. No wheezes. No rales.  Abdominal: Soft. Bowel sounds are normal. Exhibits no distension and no mass. There is no tenderness.  Musculoskeletal: Normal range of motion.  Lymphadenopathy:    No cervical adenopathy.  Neurological: Alert and oriented to person, place, and time. Exhibits normal muscle tone. Gait normal. Coordination normal.  Skin: Skin is warm and dry. No rash noted. Not diaphoretic. No erythema. No pallor.  Psychiatric: Mood, memory and judgment normal.  Vitals reviewed.  LABORATORY DATA: Lab Results  Component Value Date   WBC 5.7 07/17/2018   HGB 12.2 (L) 07/17/2018   HCT 38.2 (L) 07/17/2018   MCV 77.5 (L) 07/17/2018   PLT 227 07/17/2018  Chemistry      Component Value Date/Time   NA 139 07/17/2018 0851   NA 139 03-03-202018 1313   K 4.3 07/17/2018 0851   K 3.8 03-03-202018 1313   CL 99 07/17/2018 0851   CO2 30 07/17/2018 0851   CO2 27 03-03-202018 1313   BUN 19 07/17/2018 0851   BUN 9.8 03-03-202018 1313   CREATININE 1.30 (H) 07/17/2018 0851   CREATININE 0.9 03-03-202018 1313      Component Value Date/Time   CALCIUM 10.2 07/17/2018 0851   CALCIUM 8.6 03-03-202018 1313   ALKPHOS 106 07/17/2018 0851   ALKPHOS 57 03-03-202018 1313   AST 13 (L) 07/17/2018 0851   AST 9 03-03-202018 1313   ALT <6 07/17/2018 0851   ALT 6 03-03-202018 1313   BILITOT 0.5 07/17/2018 0851   BILITOT 0.23 03-03-202018 1313       RADIOGRAPHIC  STUDIES:  No results found.   ASSESSMENT/PLAN:  Adenocarcinoma of left lung, stage 3 (HCC) This is a very pleasant 62 year old African-American male recently diagnosed with a stage IIIA non-small cell lung cancer, adenocarcinoma presented with left lower lobe lung mass in addition to mediastinal lymphadenopathy.  The patient underwent a course of concurrent chemoradiation with weekly carboplatin and paclitaxel status post 7 cycles and tolerated his treatment fairly well. He is currently undergoing treatment with consolidation immunotherapy with Imfinzi (Durvalumab) status post24cycles.  The patient continues to tolerate this treatment well with no concerning complaints. I recommended for him to proceed with cycle #25 today as a scheduled. He will come back for follow-up visit in 2 weeks for evaluation before the next cycle of his treatment.  For his diastolic heart failure, he will continue to follow-up with cardiology.  His creatinine increased slightly to 1.3 today likely due to the addition of Zaroxolyn.  We will continue to watch this.  He was advised to call immediately if he has any concerning symptoms in the interval. The patient voices understanding of current disease status and treatment options and is in agreement with the current care plan. All questions were answered. The patient knows to call the clinic with any problems, questions or concerns. We can certainly see the patient much sooner if necessary.   No orders of the defined types were placed in this encounter.    Mikey Bussing, DNP, AGPCNP-BC, AOCNP 07/17/18

## 2018-07-17 NOTE — Assessment & Plan Note (Signed)
This is a very pleasant 62 year old African-American male recently diagnosed with a stage IIIA non-small cell lung cancer, adenocarcinoma presented with left lower lobe lung mass in addition to mediastinal lymphadenopathy.  The patient underwent a course of concurrent chemoradiation with weekly carboplatin and paclitaxel status post 7 cycles and tolerated his treatment fairly well. He is currently undergoing treatment with consolidation immunotherapy with Imfinzi (Durvalumab) status post24cycles.  The patient continues to tolerate this treatment well with no concerning complaints. I recommended for him to proceed with cycle #25 today as a scheduled. He will come back for follow-up visit in 2 weeks for evaluation before the next cycle of his treatment.  For his diastolic heart failure, he will continue to follow-up with cardiology.  His creatinine increased slightly to 1.3 today likely due to the addition of Zaroxolyn.  We will continue to watch this.  He was advised to call immediately if he has any concerning symptoms in the interval. The patient voices understanding of current disease status and treatment options and is in agreement with the current care plan. All questions were answered. The patient knows to call the clinic with any problems, questions or concerns. We can certainly see the patient much sooner if necessary.

## 2018-07-17 NOTE — Patient Instructions (Signed)
[facility] Discharge Instructions for Patients Receiving Chemotherapy  Today you received the following chemotherapy agents: Imfinzi.  To help prevent nausea and vomiting after your treatment, we encourage you to take your nausea medication as directed.   If you develop nausea and vomiting that is not controlled by your nausea medication, call the clinic.   BELOW ARE SYMPTOMS THAT SHOULD BE REPORTED IMMEDIATELY:  *FEVER GREATER THAN 100.5 F  *CHILLS WITH OR WITHOUT FEVER  NAUSEA AND VOMITING THAT IS NOT CONTROLLED WITH YOUR NAUSEA MEDICATION  *UNUSUAL SHORTNESS OF BREATH  *UNUSUAL BRUISING OR BLEEDING  TENDERNESS IN MOUTH AND THROAT WITH OR WITHOUT PRESENCE OF ULCERS  *URINARY PROBLEMS  *BOWEL PROBLEMS  UNUSUAL RASH Items with * indicate a potential emergency and should be followed up as soon as possible.  Feel free to call the clinic should you have any questions or concerns. The clinic phone number is [phone].  Please show the CHEMO ALERT CARD at check-in to the Emergency Department and triage nurse.   

## 2018-07-23 ENCOUNTER — Other Ambulatory Visit (HOSPITAL_COMMUNITY)
Admission: RE | Admit: 2018-07-23 | Discharge: 2018-07-23 | Disposition: A | Discharge status: Home / Self Care | Payer: Medicare Other | Source: Ambulatory Visit | Attending: Internal Medicine | Admitting: Internal Medicine

## 2018-07-23 ENCOUNTER — Telehealth: Payer: Self-pay | Admitting: *Deleted

## 2018-07-23 DIAGNOSIS — E876 Hypokalemia: Secondary | ICD-10-CM

## 2018-07-23 DIAGNOSIS — Z79899 Other long term (current) drug therapy: Secondary | ICD-10-CM | POA: Insufficient documentation

## 2018-07-23 LAB — BASIC METABOLIC PANEL
Anion gap: 8 (ref 5–15)
BUN: 18 mg/dL (ref 8–23)
CHLORIDE: 98 mmol/L (ref 98–111)
CO2: 28 mmol/L (ref 22–32)
Calcium: 9.1 mg/dL (ref 8.9–10.3)
Creatinine, Ser: 1.3 mg/dL — ABNORMAL HIGH (ref 0.61–1.24)
GFR, EST NON AFRICAN AMERICAN: 57 mL/min — AB (ref >60)
Glucose, Bld: 123 mg/dL — ABNORMAL HIGH (ref 70–99)
POTASSIUM: 3.1 mmol/L — AB (ref 3.5–5.1)
SODIUM: 134 mmol/L — AB (ref 135–145)

## 2018-07-23 LAB — BRAIN NATRIURETIC PEPTIDE: B Natriuretic Peptide: 24 pg/mL (ref 0.0–100.0)

## 2018-07-23 MED ORDER — POTASSIUM CHLORIDE CRYS ER 20 MEQ PO TBCR: EXTENDED_RELEASE_TABLET | ORAL | 3 refills | Status: DC

## 2018-07-23 NOTE — Telephone Encounter (Signed)
Notes recorded by Fay Records, MD on 07/23/2018 at 4:25 PM EDT Potassium is low  ON 3 tablets lasix when taking metalozone  SHould take 4  ON lasix only days take 3 Check BMET In 2 wks   __________________________________________________________________ Patient's sister called back (DPR)  Pt with her in the car. Pt has been vomiting on/off for past 3 days. Has ondansetron 8 mg as needed but he said it is not helping. Advised sister to call doctor today re: this as there may be a different nausea treatment available.  Re potassium of 3.1, reviewed instructions from Dr. Harrington Challenger with her.  She verbalizes understanding with this plan.  Is worried that this will make him more nauseated. Pt will return to [facility] on 10/8 for repeat BMET.  Advised should have BMET sooner if vomiting continues.  She will call if this continues.  I have placed orders in Epic and will send the reqs to her home to take with him for lab.

## 2018-07-31 ENCOUNTER — Telehealth: Payer: Self-pay | Admitting: Internal Medicine

## 2018-07-31 ENCOUNTER — Inpatient Hospital Stay: Payer: Medicare Other

## 2018-07-31 ENCOUNTER — Inpatient Hospital Stay: Payer: Medicare Other | Attending: Internal Medicine

## 2018-07-31 ENCOUNTER — Encounter: Payer: Self-pay | Admitting: Oncology

## 2018-07-31 ENCOUNTER — Inpatient Hospital Stay (HOSPITAL_BASED_OUTPATIENT_CLINIC_OR_DEPARTMENT_OTHER): Payer: Medicare Other | Admitting: Oncology

## 2018-07-31 VITALS — HR 77

## 2018-07-31 VITALS — BP 122/76 | HR 110 | Temp 97.5°F | Resp 18 | Ht 66.0 in | Wt 196.1 lb

## 2018-07-31 DIAGNOSIS — I5032 Chronic diastolic (congestive) heart failure: Secondary | ICD-10-CM | POA: Diagnosis not present

## 2018-07-31 DIAGNOSIS — Z5112 Encounter for antineoplastic immunotherapy: Secondary | ICD-10-CM | POA: Diagnosis not present

## 2018-07-31 DIAGNOSIS — I2692 Saddle embolus of pulmonary artery without acute cor pulmonale: Secondary | ICD-10-CM

## 2018-07-31 DIAGNOSIS — R5382 Chronic fatigue, unspecified: Secondary | ICD-10-CM

## 2018-07-31 DIAGNOSIS — C3432 Malignant neoplasm of lower lobe, left bronchus or lung: Secondary | ICD-10-CM | POA: Diagnosis not present

## 2018-07-31 DIAGNOSIS — C771 Secondary and unspecified malignant neoplasm of intrathoracic lymph nodes: Secondary | ICD-10-CM

## 2018-07-31 DIAGNOSIS — Z7901 Long term (current) use of anticoagulants: Secondary | ICD-10-CM | POA: Insufficient documentation

## 2018-07-31 DIAGNOSIS — E669 Obesity, unspecified: Secondary | ICD-10-CM | POA: Insufficient documentation

## 2018-07-31 DIAGNOSIS — Z79899 Other long term (current) drug therapy: Secondary | ICD-10-CM | POA: Diagnosis not present

## 2018-07-31 DIAGNOSIS — C3492 Malignant neoplasm of unspecified part of left bronchus or lung: Secondary | ICD-10-CM

## 2018-07-31 DIAGNOSIS — Z9981 Dependence on supplemental oxygen: Secondary | ICD-10-CM | POA: Diagnosis not present

## 2018-07-31 LAB — CBC WITH DIFFERENTIAL (CANCER CENTER ONLY)
BASOS PCT: 1 %
Basophils Absolute: 0.1 10*3/uL (ref 0.0–0.1)
EOS ABS: 0.2 10*3/uL (ref 0.0–0.5)
EOS PCT: 3 %
HCT: 38.7 % (ref 38.4–49.9)
Hemoglobin: 12.2 g/dL — ABNORMAL LOW (ref 13.0–17.1)
Lymphocytes Relative: 20 %
Lymphs Abs: 1.1 10*3/uL (ref 0.9–3.3)
MCH: 24.6 pg — ABNORMAL LOW (ref 27.2–33.4)
MCHC: 31.5 g/dL — ABNORMAL LOW (ref 32.0–36.0)
MCV: 78.1 fL — ABNORMAL LOW (ref 79.3–98.0)
MONO ABS: 0.5 10*3/uL (ref 0.1–0.9)
Monocytes Relative: 8 %
NEUTROS PCT: 68 %
Neutro Abs: 3.8 10*3/uL (ref 1.5–6.5)
Platelet Count: 247 10*3/uL (ref 140–400)
RBC: 4.95 MIL/uL (ref 4.20–5.82)
RDW: 17.6 % — AB (ref 11.0–14.6)
WBC Count: 5.6 10*3/uL (ref 4.0–10.3)

## 2018-07-31 LAB — CMP (CANCER CENTER ONLY)
ALK PHOS: 85 U/L (ref 38–126)
AST: 10 U/L — ABNORMAL LOW (ref 15–41)
Albumin: 3.6 g/dL (ref 3.5–5.0)
Anion gap: 8 (ref 5–15)
BUN: 15 mg/dL (ref 8–23)
CO2: 30 mmol/L (ref 22–32)
CREATININE: 1.32 mg/dL — AB (ref 0.61–1.24)
Calcium: 9.6 mg/dL (ref 8.9–10.3)
Chloride: 99 mmol/L (ref 98–111)
GFR, Estimated: 56 mL/min — ABNORMAL LOW (ref >60)
Glucose, Bld: 91 mg/dL (ref 70–99)
Potassium: 4 mmol/L (ref 3.5–5.1)
Sodium: 137 mmol/L (ref 135–145)
TOTAL PROTEIN: 8.3 g/dL — AB (ref 6.5–8.1)
Total Bilirubin: 0.3 mg/dL (ref 0.3–1.2)

## 2018-07-31 LAB — TSH: TSH: 0.232 u[IU]/mL — ABNORMAL LOW (ref 0.320–4.118)

## 2018-07-31 MED ORDER — SODIUM CHLORIDE 0.9% FLUSH
10.0000 mL | Freq: Once | INTRAVENOUS | Status: AC
  Administered 2018-07-31: 10 mL
  Filled 2018-07-31: qty 10

## 2018-07-31 MED ORDER — HEPARIN SOD (PORK) LOCK FLUSH 100 UNIT/ML IV SOLN
500.0000 [IU] | Freq: Once | INTRAVENOUS | Status: AC | PRN
  Administered 2018-07-31: 500 [IU]
  Filled 2018-07-31: qty 5

## 2018-07-31 MED ORDER — SODIUM CHLORIDE 0.9 % IV SOLN
860.0000 mg | Freq: Once | INTRAVENOUS | Status: AC
  Administered 2018-07-31: 860 mg via INTRAVENOUS
  Filled 2018-07-31: qty 17.2

## 2018-07-31 MED ORDER — SODIUM CHLORIDE 0.9% FLUSH
10.0000 mL | INTRAVENOUS | Status: DC | PRN
  Administered 2018-07-31: 10 mL
  Filled 2018-07-31: qty 10

## 2018-07-31 MED ORDER — RIVAROXABAN 20 MG PO TABS: 20.0000 mg | ORAL_TABLET | Freq: Every day | ORAL | 1 refills | Status: DC

## 2018-07-31 MED ORDER — SODIUM CHLORIDE 0.9 % IV SOLN
Freq: Once | INTRAVENOUS | Status: AC
  Administered 2018-07-31: 10:00:00 via INTRAVENOUS
  Filled 2018-07-31: qty 250

## 2018-07-31 NOTE — Assessment & Plan Note (Signed)
This is a very pleasant 62 year old African-American male recently diagnosed with a stage IIIA non-small cell lung cancer, adenocarcinoma presented with left lower lobe lung mass in addition to mediastinal lymphadenopathy.  The patient underwent a course of concurrent chemoradiation with weekly carboplatin and paclitaxel status post 7 cycles and tolerated his treatment fairly well. He is currently undergoing treatment with consolidation immunotherapy with Imfinzi (Durvalumab) status post25cycles.  The patient continues to tolerate this treatment well with no concerning complaints. I recommended for him to proceed with cycle #26today as a scheduled.  This will be his final dose of Imfinzi. He will have a restaging CT scan of the chest in 2 to 3 weeks.  He will follow-up 1 to 2 days after the CT scan to discuss the results.  For his diastolic heart failure, he will continue to follow-up with cardiology.  His creatinine increased slightly to 1.32 stable.  He was advised to call immediately if he has any concerning symptoms in the interval. The patient voices understanding of current disease status and treatment options and is in agreement with the current care plan. All questions were answered. The patient knows to call the clinic with any problems, questions or concerns. We can certainly see the patient much sooner if necessary.

## 2018-07-31 NOTE — Patient Instructions (Signed)
[facility] Discharge Instructions for Patients Receiving Chemotherapy  Today you received the following chemotherapy agents: Imfinzi.  To help prevent nausea and vomiting after your treatment, we encourage you to take your nausea medication as directed.   If you develop nausea and vomiting that is not controlled by your nausea medication, call the clinic.   BELOW ARE SYMPTOMS THAT SHOULD BE REPORTED IMMEDIATELY:  *FEVER GREATER THAN 100.5 F  *CHILLS WITH OR WITHOUT FEVER  NAUSEA AND VOMITING THAT IS NOT CONTROLLED WITH YOUR NAUSEA MEDICATION  *UNUSUAL SHORTNESS OF BREATH  *UNUSUAL BRUISING OR BLEEDING  TENDERNESS IN MOUTH AND THROAT WITH OR WITHOUT PRESENCE OF ULCERS  *URINARY PROBLEMS  *BOWEL PROBLEMS  UNUSUAL RASH Items with * indicate a potential emergency and should be followed up as soon as possible.  Feel free to call the clinic should you have any questions or concerns. The clinic phone number is [phone].  Please show the CHEMO ALERT CARD at check-in to the Emergency Department and triage nurse.   

## 2018-07-31 NOTE — Telephone Encounter (Signed)
Appts scheduled avs/calenda printed per 10/2 los

## 2018-07-31 NOTE — Progress Notes (Signed)
[facility] OFFICE PROGRESS NOTE  [name], MD [address] [place] [address]  DIAGNOSIS:Stage IIIA (T2a, N2, M0) non-small cell lung cancer, adenocarcinoma presented with left lower lobe lung mass in addition to mediastinal lymphadenopathy diagnosed in June 2018. The patient also has suspicious soft tissue density in the pancreatic head but this has been present for 3 years on previous CT scan and unlikely to be related to [name], last dose was given 07/03/2017.  CURRENT THERAPY: Consolidation treatment with immunotherapy with Imfinzi (Durvalumab) 10 MG/KG every 2 weeks, first dose 08/15/2017. Status post 25cycles.  INTERVAL HISTORY: James Zamora 62 y.o. male returns for routine follow-up visit accompanied by his sister.  The patient is feeling fine today and has no specific complaints.  He did experience some nausea and vomiting for a few days following his last treatment.  He also had diarrhea.  This is now completely resolved.  He denies fevers and chills.  Denies chest pain, cough, hemoptysis.  His baseline shortness of breath and wears home oxygen.  Today he denies nausea, vomiting, constipation, diarrhea.  Denies rashes.  The patient is here for evaluation prior to cycle #26 of Imfinzi which will be his final dose.  MEDICAL HISTORY: Past Medical History:  Diagnosis Date  . Adenocarcinoma of left lung, stage 3 ([place]) 05/10/2017  . Aortic atherosclerosis ([place]) 07/31/2017  . Asthma   . Chronic diastolic CHF (congestive heart failure) ([place])    a. 07/2017: echo showing EF of 65-70% with Grade 1 DD  . Chronic fatigue 08/02/2017  . Lung mass   . Non-small cell lung cancer ([place]) 11/13/2015  . Obesity   . Perforated bowel ([place])     ALLERGIES:  is allergic to bee venom and  penicillins.  MEDICATIONS:  Current Outpatient Medications  Medication Sig Dispense Refill  . aspirin EC 81 MG EC tablet Take 1 tablet (81 mg total) by mouth daily. 30 tablet 1  . fluticasone furoate-vilanterol (BREO ELLIPTA) 200-25 MCG/INH AEPB Inhale 1 puff into the lungs daily. 1 each 5  . furosemide (LASIX) 80 MG tablet Take 1 tablet (80 mg total) by mouth daily. 90 tablet 3  . lidocaine-prilocaine (EMLA) cream Apply 1 application topically as needed. Apply small amount to port site 1-1.5 hours before treatment. Cover with plastic wrap. 30 g 0  . metolazone (ZAROXOLYN) 2.5 MG tablet Take 2.5 mg 2 times a week, take on Monday and Thursday 24 tablet 3  . ondansetron (ZOFRAN) 8 MG tablet Take 1 tablet (8 mg total) by mouth every 8 (eight) hours as needed for nausea or vomiting. 30 tablet 0  . OXYGEN Inhale 4 L into the lungs continuous.     . potassium chloride SA (K-DUR,KLOR-CON) 20 MEQ tablet On Monday and Thursday, take 4 tablets (80 mg) all other days take 3 tablets (60 meq) 276 tablet 3  . rivaroxaban (XARELTO) 20 MG TABS tablet Take 1 tablet (20 mg total) by mouth daily with supper. Begin 20 mg daily after starter pack is complete. 30 tablet 2   No current facility-administered medications for this visit.     SURGICAL HISTORY:  Past Surgical History:  Procedure Laterality Date  . CHOLECYSTECTOMY    . COLONOSCOPY N/A 07/31/2016   Procedure: COLONOSCOPY;  Surgeon: Danie Binder, MD;  [facility];  Service: Endoscopy;  Laterality: N/A;  1:45 pm  . ESOPHAGOGASTRODUODENOSCOPY N/A 07/31/2016   Procedure: ESOPHAGOGASTRODUODENOSCOPY (EGD);  Surgeon: Danie Binder, MD;  Location[facility];  Service: Endoscopy;  Laterality: N/A;  . EUS N/A 06/07/2017   Procedure: UPPER ENDOSCOPIC ULTRASOUND (EUS) LINEAR;  Surgeon: Milus Banister, MD;  Location[facility];  Service: Endoscopy;  Laterality: N/A;  . HERNIA REPAIR    . IR FLUORO GUIDE PORT INSERTION RIGHT  07/11/2017  . IR  US GUIDE VASC ACCESS RIGHT  07/11/2017    REVIEW OF SYSTEMS:   Review of Systems  Constitutional: Negative for appetite change, chills, fatigue, fever and unexpected weight change.  HENT:   Negative for mouth sores, nosebleeds, sore throat and trouble swallowing.   Eyes: Negative for eye problems and icterus.  Respiratory: Negative for cough, hemoptysis, and wheezing.  He has baseline shortness of breath and wears home oxygen. Cardiovascular: Negative for chest pain.  Stable bilateral lower extremity edema. Gastrointestinal: Negative for abdominal pain, constipation, diarrhea, nausea and vomiting.  Genitourinary: Negative for bladder incontinence, difficulty urinating, dysuria, frequency and hematuria.   Musculoskeletal: Negative for back pain, gait problem, neck pain and neck stiffness.  Skin: Negative for itching and rash.  Neurological: Negative for dizziness, extremity weakness, gait problem, headaches, light-headedness and seizures.  Hematological: Negative for adenopathy. Does not bruise/bleed easily.  Psychiatric/Behavioral: Negative for confusion, depression and sleep disturbance. The patient is not nervous/anxious.     PHYSICAL EXAMINATION:  Blood pressure 122/76, pulse (!) 110, temperature (!) 97.5 F (36.4 C), temperature source Oral, resp. rate 18, height 5\' 6"  (1.676 m), weight 196 lb 1.6 oz (89 kg), SpO2 99 %.  ECOG PERFORMANCE STATUS: 1 - Symptomatic but completely ambulatory  Physical Exam  Constitutional: Oriented to person, place, and time and well-developed, well-nourished, and in no distress. No distress.  HENT:  Head: Normocephalic and atraumatic.  Mouth/Throat: Oropharynx is clear and moist. No oropharyngeal exudate.  Eyes: Conjunctivae are normal. Right eye exhibits no discharge. Left eye exhibits no discharge. No scleral icterus.  Neck: Normal range of motion. Neck supple.  Cardiovascular: Normal rate, regular rhythm, normal heart sounds.  1+ bilateral lower  extremity edema. Pulmonary/Chest: Effort normal and breath sounds normal. No respiratory distress. No wheezes. No rales.  Abdominal: Soft. Bowel sounds are normal. Exhibits no distension and no mass. There is no tenderness.  Musculoskeletal: Normal range of motion. Exhibits no edema.  Lymphadenopathy:    No cervical adenopathy.  Neurological: Alert and oriented to person, place, and time. Exhibits normal muscle tone. Gait normal. Coordination normal.  Skin: Skin is warm and dry. No rash noted. Not diaphoretic. No erythema. No pallor.  Psychiatric: Mood, memory and judgment normal.  Vitals reviewed.  LABORATORY DATA: Lab Results  Component Value Date   WBC 5.6 07/31/2018   HGB 12.2 (L) 07/31/2018   HCT 38.7 07/31/2018   MCV 78.1 (L) 07/31/2018   PLT 247 07/31/2018      Chemistry      Component Value Date/Time   NA 137 07/31/2018 0853   NA 139 Oct 27, 202018 1313   K 4.0 07/31/2018 0853   K 3.8 Oct 27, 202018 1313   CL 99 07/31/2018 0853   CO2 30 07/31/2018 0853   CO2 27 Oct 27, 202018 1313   BUN 15 07/31/2018 0853   BUN 9.8 Oct 27, 202018 1313   CREATININE 1.32 (H) 07/31/2018 0853   CREATININE 0.9 Oct 27, 202018 1313      Component Value Date/Time   CALCIUM 9.6 07/31/2018 0853  CALCIUM 8.6 08/17/202018 1313   ALKPHOS 85 07/31/2018 0853   ALKPHOS 57 08/17/202018 1313   AST 10 (L) 07/31/2018 0853   AST 9 08/17/202018 1313   ALT <6 07/31/2018 0853   ALT 6 08/17/202018 1313   BILITOT 0.3 07/31/2018 0853   BILITOT 0.23 08/17/202018 1313       RADIOGRAPHIC STUDIES:  No results found.   ASSESSMENT/PLAN:  Adenocarcinoma of left lung, stage 3 (H[facility]) This is a very pleasant 62 year old African-American male recently diagnosed with a stage IIIA non-small cell lung cancer, adenocarcinoma presented with left lower lobe lung mass in addition to mediastinal lymphadenopathy.  The patient underwent a course of concurrent chemoradiation with weekly carboplatin and paclitaxel status post 7 cycles and  tolerated his treatment fairly well. He is currently undergoing treatment with consolidation immunotherapy with Imfinzi (Durvalumab) status post25cycles.  The patient continues to tolerate this treatment well with no concerning complaints. I recommended for him to proceed with cycle #26today as a scheduled.  This will be his final dose of Imfinzi. He will have a restaging CT scan of the chest in 2 to 3 weeks.  He will follow-up 1 to 2 days after the CT scan to discuss the results.  For his diastolic heart failure, he will continue to follow-up with cardiology.  His creatinine increased slightly to 1.32 stable.  He was advised to call immediately if he has any concerning symptoms in the interval. The patient voices understanding of current disease status and treatment options and is in agreement with the current care plan. All questions were answered. The patient knows to call the clinic with any problems, questions or concerns. We can certainly see the patient much sooner if necessary.   Orders Placed This Encounter  Procedures  . CT CHEST W CONTRAST    Standing Status:   Future    Standing Expiration Date:   08/01/2019    Order Specific Question:   If indicated for the ordered procedure, I authorize the administration of contrast media per Radiology protocol    Answer:   Yes    Order Specific Question:   Preferred imaging location?    Answer:   [facility]    Order Specific Question:   Radiology Contrast Protocol - do NOT remove file path    Answer:   [email or web address]    Order Specific Question:   ** REASON FOR EXAM (FREE TEXT)    Answer:   Lung cancer. Restaging.     Mikey Bussing, DNP, AGPCNP-BC, AOCNP 07/31/18

## 2018-08-13 ENCOUNTER — Telehealth: Payer: Self-pay | Admitting: Internal Medicine

## 2018-08-13 ENCOUNTER — Inpatient Hospital Stay: Payer: Medicare Other

## 2018-08-13 ENCOUNTER — Inpatient Hospital Stay (HOSPITAL_BASED_OUTPATIENT_CLINIC_OR_DEPARTMENT_OTHER): Payer: Medicare Other | Admitting: Internal Medicine

## 2018-08-13 ENCOUNTER — Encounter: Payer: Self-pay | Admitting: Internal Medicine

## 2018-08-13 VITALS — BP 99/73 | HR 129 | Temp 97.6°F | Resp 17 | Ht 66.0 in | Wt 191.6 lb

## 2018-08-13 DIAGNOSIS — R0602 Shortness of breath: Secondary | ICD-10-CM

## 2018-08-13 DIAGNOSIS — C3432 Malignant neoplasm of lower lobe, left bronchus or lung: Secondary | ICD-10-CM

## 2018-08-13 DIAGNOSIS — R000 Tachycardia, unspecified: Secondary | ICD-10-CM

## 2018-08-13 DIAGNOSIS — R5382 Chronic fatigue, unspecified: Secondary | ICD-10-CM | POA: Diagnosis not present

## 2018-08-13 DIAGNOSIS — C771 Secondary and unspecified malignant neoplasm of intrathoracic lymph nodes: Secondary | ICD-10-CM | POA: Diagnosis not present

## 2018-08-13 DIAGNOSIS — Z79899 Other long term (current) drug therapy: Secondary | ICD-10-CM

## 2018-08-13 DIAGNOSIS — E669 Obesity, unspecified: Secondary | ICD-10-CM

## 2018-08-13 DIAGNOSIS — C3490 Malignant neoplasm of unspecified part of unspecified bronchus or lung: Secondary | ICD-10-CM

## 2018-08-13 DIAGNOSIS — I5032 Chronic diastolic (congestive) heart failure: Secondary | ICD-10-CM | POA: Diagnosis not present

## 2018-08-13 DIAGNOSIS — Z9981 Dependence on supplemental oxygen: Secondary | ICD-10-CM

## 2018-08-13 DIAGNOSIS — Z5112 Encounter for antineoplastic immunotherapy: Secondary | ICD-10-CM | POA: Diagnosis not present

## 2018-08-13 LAB — CBC WITH DIFFERENTIAL (CANCER CENTER ONLY)
ABS IMMATURE GRANULOCYTES: 0.01 10*3/uL (ref 0.00–0.07)
BASOS PCT: 1 %
Basophils Absolute: 0 10*3/uL (ref 0.0–0.1)
Eosinophils Absolute: 0.2 10*3/uL (ref 0.0–0.5)
Eosinophils Relative: 3 %
HCT: 44.9 % (ref 39.0–52.0)
HEMOGLOBIN: 14 g/dL (ref 13.0–17.0)
IMMATURE GRANULOCYTES: 0 %
LYMPHS PCT: 30 %
Lymphs Abs: 1.6 10*3/uL (ref 0.7–4.0)
MCH: 24.3 pg — AB (ref 26.0–34.0)
MCHC: 31.2 g/dL (ref 30.0–36.0)
MCV: 78 fL — AB (ref 80.0–100.0)
MONOS PCT: 9 %
Monocytes Absolute: 0.5 10*3/uL (ref 0.1–1.0)
NEUTROS ABS: 3 10*3/uL (ref 1.7–7.7)
NEUTROS PCT: 57 %
Platelet Count: 211 10*3/uL (ref 150–400)
RBC: 5.76 MIL/uL (ref 4.22–5.81)
RDW: 15.3 % (ref 11.5–15.5)
WBC Count: 5.3 10*3/uL (ref 4.0–10.5)
nRBC: 0 % (ref 0.0–0.2)

## 2018-08-13 LAB — CMP (CANCER CENTER ONLY)
ALT: 10 U/L (ref 0–44)
ANION GAP: 16 — AB (ref 5–15)
AST: 14 U/L — AB (ref 15–41)
Albumin: 4 g/dL (ref 3.5–5.0)
Alkaline Phosphatase: 115 U/L (ref 38–126)
BUN: 28 mg/dL — ABNORMAL HIGH (ref 8–23)
CHLORIDE: 92 mmol/L — AB (ref 98–111)
CO2: 32 mmol/L (ref 22–32)
Calcium: 10.2 mg/dL (ref 8.9–10.3)
Creatinine: 1.95 mg/dL — ABNORMAL HIGH (ref 0.61–1.24)
GFR, Est AFR Am: 41 mL/min — ABNORMAL LOW (ref >60)
GFR, Estimated: 35 mL/min — ABNORMAL LOW (ref >60)
Glucose, Bld: 113 mg/dL — ABNORMAL HIGH (ref 70–99)
POTASSIUM: 3 mmol/L — AB (ref 3.5–5.1)
SODIUM: 140 mmol/L (ref 135–145)
TOTAL PROTEIN: 9.1 g/dL — AB (ref 6.5–8.1)
Total Bilirubin: 0.4 mg/dL (ref 0.3–1.2)

## 2018-08-13 MED ORDER — SODIUM CHLORIDE 0.9% FLUSH
10.0000 mL | Freq: Once | INTRAVENOUS | Status: AC
  Administered 2018-08-13: 10 mL
  Filled 2018-08-13: qty 10

## 2018-08-13 MED ORDER — HEPARIN SOD (PORK) LOCK FLUSH 100 UNIT/ML IV SOLN
500.0000 [IU] | Freq: Once | INTRAVENOUS | Status: AC
  Administered 2018-08-13: 500 [IU]
  Filled 2018-08-13: qty 5

## 2018-08-13 NOTE — Patient Instructions (Signed)
Implanted Port Home Guide An implanted port is a type of central line that is placed under the skin. Central lines are used to provide IV access when treatment or nutrition needs to be given through a person's veins. Implanted ports are used for long-term IV access. An implanted port may be placed because:  You need IV medicine that would be irritating to the small veins in your hands or arms.  You need long-term IV medicines, such as antibiotics.  You need IV nutrition for a long period.  You need frequent blood draws for lab tests.  You need dialysis.  Implanted ports are usually placed in the chest area, but they can also be placed in the upper arm, the abdomen, or the leg. An implanted port has two main parts:  Reservoir. The reservoir is round and will appear as a small, raised area under your skin. The reservoir is the part where a needle is inserted to give medicines or draw blood.  Catheter. The catheter is a thin, flexible tube that extends from the reservoir. The catheter is placed into a large vein. Medicine that is inserted into the reservoir goes into the catheter and then into the vein.  How will I care for my incision site? Do not get the incision site wet. Bathe or shower as directed by your health care provider. How is my port accessed? Special steps must be taken to access the port:  Before the port is accessed, a numbing cream can be placed on the skin. This helps numb the skin over the port site.  Your health care provider uses a sterile technique to access the port. ? Your health care provider must put on a mask and sterile gloves. ? The skin over your port is cleaned carefully with an antiseptic and allowed to dry. ? The port is gently pinched between sterile gloves, and a needle is inserted into the port.  Only "non-coring" port needles should be used to access the port. Once the port is accessed, a blood return should be checked. This helps ensure that the port  is in the vein and is not clogged.  If your port needs to remain accessed for a constant infusion, a clear (transparent) bandage will be placed over the needle site. The bandage and needle will need to be changed every week, or as directed by your health care provider.  Keep the bandage covering the needle clean and dry. Do not get it wet. Follow your health care provider's instructions on how to take a shower or bath while the port is accessed.  If your port does not need to stay accessed, no bandage is needed over the port.  What is flushing? Flushing helps keep the port from getting clogged. Follow your health care provider's instructions on how and when to flush the port. Ports are usually flushed with saline solution or a medicine called heparin. The need for flushing will depend on how the port is used.  If the port is used for intermittent medicines or blood draws, the port will need to be flushed: ? After medicines have been given. ? After blood has been drawn. ? As part of routine maintenance.  If a constant infusion is running, the port may not need to be flushed.  How long will my port stay implanted? The port can stay in for as long as your health care provider thinks it is needed. When it is time for the port to come out, surgery will be   done to remove it. The procedure is similar to the one performed when the port was put in. When should I seek immediate medical care? When you have an implanted port, you should seek immediate medical care if:  You notice a bad smell coming from the incision site.  You have swelling, redness, or drainage at the incision site.  You have more swelling or pain at the port site or the surrounding area.  You have a fever that is not controlled with medicine.  This information is not intended to replace advice given to you by your health care provider. Make sure you discuss any questions you have with your health care provider. Document  Released: 10/16/2005 Document Revised: 03/23/2016 Document Reviewed: 06/23/2013 Elsevier Interactive Patient Education  2017 [employer] Inc[employer]  

## 2018-08-13 NOTE — Progress Notes (Signed)
[facility] [phone]   [phone]) [phone]  OFFICE PROGRESS NOTE  [name], MD [address] [place] [address]  DIAGNOSIS: Stage IIIA (T2a, N2, M0) non-small cell lung cancer, adenocarcinoma presented with left lower lobe lung mass in addition to mediastinal lymphadenopathy diagnosed in June 2018. The patient also has suspicious soft tissue density in the pancreatic head but this has been present for 3 years on previous CT scan and unlikely to be related to his current diagnosis of the lung cancer.  PRIOR THERAPY:  1) Concurrent chemoradiation with weekly carboplatin for AUC of 2 and paclitaxel 45 MG/M2. Status post 7 cycles, last dose was given 07/03/2017. 2) Consolidation treatment with immunotherapy with Imfinzi (Durvalumab) 10 MG/KG every 2 weeks, first dose 08/15/2017.  Status post 26 cycles.  CURRENT THERAPY: Observation.  INTERVAL HISTORY: James Zamora 62 y.o. male returns to the clinic today for follow-up visit accompanied by his sister.  The patient is feeling fine today with no concerning complaints except for the baseline shortness of breath.  He is currently on home oxygen.  The patient completed the course of consolidation treatment with immunotherapy with Imfinzi (Durvalumab) and tolerated it fairly well.  He was supposed to have repeat CT scan of the chest before this visit but unfortunately no one called him to schedule the ordered scan.  The patient denied having any chest pain, cough or hemoptysis.  He denied having any fever or chills.  He has no nausea, vomiting, diarrhea or constipation.  He is here today for evaluation and repeat blood work.   MEDICAL HISTORY: Past Medical History:  Diagnosis Date  . Adenocarcinoma of left lung, stage 3 ([place]) 05/10/2017  . Aortic atherosclerosis ([place]) 07/31/2017  . Asthma   . Chronic diastolic CHF (congestive heart failure) ([place])    a. 07/2017: echo showing EF of 65-70% with Grade 1  DD  . Chronic fatigue 08/02/2017  . Lung mass   . Non-small cell lung cancer ([place]) 11/13/2015  . Obesity   . Perforated bowel ([place])     ALLERGIES:  is allergic to bee venom and penicillins.  MEDICATIONS:  Current Outpatient Medications  Medication Sig Dispense Refill  . aspirin EC 81 MG EC tablet Take 1 tablet (81 mg total) by mouth daily. 30 tablet 1  . fluticasone furoate-vilanterol (BREO ELLIPTA) 200-25 MCG/INH AEPB Inhale 1 puff into the lungs daily. 1 each 5  . furosemide (LASIX) 80 MG tablet Take 1 tablet (80 mg total) by mouth daily. 90 tablet 3  . lidocaine-prilocaine (EMLA) cream Apply 1 application topically as needed. Apply small amount to port site 1-1.5 hours before treatment. Cover with plastic wrap. 30 g 0  . metolazone (ZAROXOLYN) 2.5 MG tablet Take 2.5 mg 2 times a week, take on Monday and Thursday 24 tablet 3  . ondansetron (ZOFRAN) 8 MG tablet Take 1 tablet (8 mg total) by mouth every 8 (eight) hours as needed for nausea or vomiting. 30 tablet 0  . OXYGEN Inhale 4 L into the lungs continuous.     . potassium chloride SA (K-DUR,KLOR-CON) 20 MEQ tablet On Monday and Thursday, take 4 tablets (80 mg) all other days take 3 tablets (60 meq) 276 tablet 3  . rivaroxaban (XARELTO) 20 MG TABS tablet Take 1 tablet (20 mg total) by mouth daily with supper. Begin 20 mg daily after starter pack is complete. 30 tablet 1   No current facility-administered medications for this  visit.     SURGICAL HISTORY:  Past Surgical History:  Procedure Laterality Date  . CHOLECYSTECTOMY    . COLONOSCOPY N/A 07/31/2016   Procedure: COLONOSCOPY;  Surgeon: Danie Binder, MD;  [facility];  Service: Endoscopy;  Laterality: N/A;  1:45 pm  . ESOPHAGOGASTRODUODENOSCOPY N/A 07/31/2016   Procedure: ESOPHAGOGASTRODUODENOSCOPY (EGD);  Surgeon: Danie Binder, MD;  [facility];  Service: Endoscopy;  Laterality: N/A;  . EUS N/A 06/07/2017   Procedure: UPPER ENDOSCOPIC ULTRASOUND (EUS)  LINEAR;  Surgeon: Milus Banister, MD;  [facility];  Service: Endoscopy;  Laterality: N/A;  . HERNIA REPAIR    . IR FLUORO GUIDE PORT INSERTION RIGHT  07/11/2017  . IR US GUIDE VASC ACCESS RIGHT  07/11/2017    REVIEW OF SYSTEMS:  A comprehensive review of systems was negative except for: Respiratory: positive for dyspnea on exertion   PHYSICAL EXAMINATION: General appearance: alert, cooperative and no distress Head: Normocephalic, without obvious abnormality, atraumatic Neck: no adenopathy, no JVD, supple, symmetrical, trachea midline and thyroid not enlarged, symmetric, no tenderness/mass/nodules Lymph nodes: Cervical, supraclavicular, and axillary nodes normal. Resp: clear to auscultation bilaterally Back: symmetric, no curvature. ROM normal. No CVA tenderness. Cardio: regular rate and rhythm, S1, S2 normal, no murmur, click, rub or gallop GI: soft, non-tender; bowel sounds normal; no masses,  no organomegaly Extremities: edema 1+  ECOG PERFORMANCE STATUS: 1 - Symptomatic but completely ambulatory  Blood pressure 99/73, pulse (!) 129, temperature 97.6 F (36.4 C), temperature source Oral, resp. rate 17, height 5\' 6"  (1.676 m), weight 191 lb 9.6 oz (86.9 kg), SpO2 99 %.  LABORATORY DATA: Lab Results  Component Value Date   WBC 5.3 08/13/2018   HGB 14.0 08/13/2018   HCT 44.9 08/13/2018   MCV 78.0 (L) 08/13/2018   PLT 211 08/13/2018      Chemistry      Component Value Date/Time   NA 137 07/31/2018 0853   NA 139 Jul 17, 202018 1313   K 4.0 07/31/2018 0853   K 3.8 Jul 17, 202018 1313   CL 99 07/31/2018 0853   CO2 30 07/31/2018 0853   CO2 27 Jul 17, 202018 1313   BUN 15 07/31/2018 0853   BUN 9.8 Jul 17, 202018 1313   CREATININE 1.32 (H) 07/31/2018 0853   CREATININE 0.9 Jul 17, 202018 1313      Component Value Date/Time   CALCIUM 9.6 07/31/2018 0853   CALCIUM 8.6 Jul 17, 202018 1313   ALKPHOS 85 07/31/2018 0853   ALKPHOS 57 Jul 17, 202018 1313   AST 10 (L) 07/31/2018 0853   AST 9  Jul 17, 202018 1313   ALT <6 07/31/2018 0853   ALT 6 Jul 17, 202018 1313   BILITOT 0.3 07/31/2018 0853   BILITOT 0.23 Jul 17, 202018 1313       RADIOGRAPHIC STUDIES: No results found.  ASSESSMENT AND PLAN:  This is a very pleasant 62 years old African-American male recently diagnosed with a stage IIIA non-small cell lung cancer, adenocarcinoma presented with left lower lobe lung mass in addition to mediastinal lymphadenopathy.  The patient underwent a course of concurrent chemoradiation with weekly carboplatin and paclitaxel status post 7 cycles and tolerated his treatment fairly well. He also completed a course of consolidation immunotherapy with Imfinzi (Durvalumab) status post 26 cycles.  He tolerated this treatment well. He was supposed to have restaging scan of the chest before this visit but unfortunately this was not scheduled as ordered. I will arrange for the patient to have repeat CT scan of the chest performed at [facility]  within the next few days.  If this scan showed no concerning findings for disease progression, I will see him back for follow-up visit in 3 months with repeat CT scan of the chest for restaging of his disease. The patient was advised to call immediately if he has any concerning symptoms in the interval. For tachycardia, I encouraged him to increase his oral intake of fluid. The patient voices understanding of current disease status and treatment options and is in agreement with the current care plan. All questions were answered. The patient knows to call the clinic with any problems, questions or concerns. We can certainly see the patient much sooner if necessary.  Disclaimer: This note was dictated with voice recognition software. Similar sounding words can inadvertently be transcribed and may not be corrected upon review.

## 2018-08-13 NOTE — Telephone Encounter (Signed)
Scheduled appt per 10/15 los - gave patient AVS and calender per los.

## 2018-08-15 ENCOUNTER — Other Ambulatory Visit: Payer: Self-pay | Admitting: Medical Oncology

## 2018-08-15 ENCOUNTER — Telehealth: Payer: Self-pay | Admitting: Internal Medicine

## 2018-08-15 DIAGNOSIS — E876 Hypokalemia: Secondary | ICD-10-CM

## 2018-08-15 DIAGNOSIS — C3490 Malignant neoplasm of unspecified part of unspecified bronchus or lung: Secondary | ICD-10-CM

## 2018-08-15 MED ORDER — POTASSIUM CHLORIDE CRYS ER 20 MEQ PO TBCR: 20.0000 meq | EXTENDED_RELEASE_TABLET | Freq: Every day | ORAL | 0 refills | Status: DC

## 2018-08-15 NOTE — Telephone Encounter (Signed)
Appt scheduled and patient notified per 10/17 sch msg

## 2018-08-15 NOTE — Progress Notes (Signed)
Sister notified.

## 2018-08-21 ENCOUNTER — Ambulatory Visit (HOSPITAL_COMMUNITY)
Admission: RE | Admit: 2018-08-21 | Discharge: 2018-08-21 | Disposition: A | Discharge status: Home / Self Care | Payer: Medicare Other | Source: Ambulatory Visit | Attending: Internal Medicine | Admitting: Internal Medicine

## 2018-08-21 DIAGNOSIS — J439 Emphysema, unspecified: Secondary | ICD-10-CM | POA: Diagnosis not present

## 2018-08-21 DIAGNOSIS — R590 Localized enlarged lymph nodes: Secondary | ICD-10-CM | POA: Insufficient documentation

## 2018-08-21 DIAGNOSIS — I289 Disease of pulmonary vessels, unspecified: Secondary | ICD-10-CM | POA: Diagnosis not present

## 2018-08-21 DIAGNOSIS — J479 Bronchiectasis, uncomplicated: Secondary | ICD-10-CM | POA: Diagnosis not present

## 2018-08-21 DIAGNOSIS — J432 Centrilobular emphysema: Secondary | ICD-10-CM | POA: Insufficient documentation

## 2018-08-21 DIAGNOSIS — C3490 Malignant neoplasm of unspecified part of unspecified bronchus or lung: Secondary | ICD-10-CM | POA: Diagnosis not present

## 2018-08-22 ENCOUNTER — Encounter: Payer: Self-pay | Admitting: Student

## 2018-08-22 ENCOUNTER — Ambulatory Visit (INDEPENDENT_AMBULATORY_CARE_PROVIDER_SITE_OTHER): Payer: Medicare Other | Admitting: Student

## 2018-08-22 VITALS — BP 114/64 | HR 100 | Ht 66.0 in | Wt 204.0 lb

## 2018-08-22 DIAGNOSIS — J449 Chronic obstructive pulmonary disease, unspecified: Secondary | ICD-10-CM | POA: Diagnosis not present

## 2018-08-22 DIAGNOSIS — C3490 Malignant neoplasm of unspecified part of unspecified bronchus or lung: Secondary | ICD-10-CM | POA: Diagnosis not present

## 2018-08-22 DIAGNOSIS — I5032 Chronic diastolic (congestive) heart failure: Secondary | ICD-10-CM

## 2018-08-22 DIAGNOSIS — R600 Localized edema: Secondary | ICD-10-CM

## 2018-08-22 DIAGNOSIS — I10 Essential (primary) hypertension: Secondary | ICD-10-CM

## 2018-08-22 DIAGNOSIS — Z79899 Other long term (current) drug therapy: Secondary | ICD-10-CM

## 2018-08-22 NOTE — Progress Notes (Signed)
Cardiology Office Note    Date:  08/22/2018   ID:  James, Zamora 10-Jul-1956, MRN [id number]  PCP:  James Beard, MD  Cardiologist: James Carnes, MD    Chief Complaint  Patient presents with  . Follow-up    1 month visit    History of Present Illness:    James Zamora is a 62 y.o. male with past medical history of chronic diastolic CHF, HTN, COPD (on 4L [place] at baseline), history of PE (on Xarelto) and NSCLC who presents to the office today for 66-month follow-up.   He was evaluated by James Zamora multiple times in 06/2018 for fluid accumulation and was initially started on Metolazone 2.5mg  twice weekly along with continuing on Lasix 80mg  daily. He had just started the medication at the time of his visit on 07/16/2018 and was still volume overloaded by examination, therefore he was informed to continue with Metolazone and Lasix. Labs on 07/23/2018 showed BNP at 24 but K+ was low at 3.1 and creatinine was elevated slightly to 1.30. However, repeat labs at the [facility] on 08/13/2018 showed that creatinine had significantly trended upwards to 1.95 and K+ was low at 3.0.   In talking with the patient today, he reports overall "doing well" since his last office visit and denies any specific complaints. He denies any recent chest pain, palpitations, orthopnea, or PND. He has chronic lower extremity edema and dyspnea on exertion (on 4L [place] at baseline).   Most history is provided by his sister who is present and reports that his lower extremity edema has improved but is still present.  She is very concerned about his compliance with medications as she reports he will go for several days without taking them but then will take his "booster pill" for several days in a row. She does not believe that he is taking potassium supplementation as prescribed either. They have tried using a pillbox in the past but he recently refused to use this. He does not weigh himself daily.   Past Medical  History:  Diagnosis Date  . Adenocarcinoma of left lung, stage 3 ([place]) 05/10/2017  . Aortic atherosclerosis ([place]) 07/31/2017  . Asthma   . Chronic diastolic CHF (congestive heart failure) ([place])    a. 07/2017: echo showing EF of 65-70% with Grade 1 DD  . Chronic fatigue 08/02/2017  . Lung mass   . Non-small cell lung cancer ([place]) 11/13/2015  . Obesity   . Perforated bowel [facility])     Past Surgical History:  Procedure Laterality Date  . CHOLECYSTECTOMY    . COLONOSCOPY N/A 07/31/2016   Procedure: COLONOSCOPY;  Surgeon: Danie Binder, MD;  Location[facility];  Service: Endoscopy;  Laterality: N/A;  1:45 pm  . ESOPHAGOGASTRODUODENOSCOPY N/A 07/31/2016   Procedure: ESOPHAGOGASTRODUODENOSCOPY (EGD);  Surgeon: Danie Binder, MD;  Location: [facility];  Service: Endoscopy;  Laterality: N/A;  . EUS N/A 06/07/2017   Procedure: UPPER ENDOSCOPIC ULTRASOUND (EUS) LINEAR;  Surgeon: Milus Banister, MD;  Location: [facility];  Service: Endoscopy;  Laterality: N/A;  . HERNIA REPAIR    . IR FLUORO GUIDE PORT INSERTION RIGHT  07/11/2017  . IR US GUIDE VASC ACCESS RIGHT  07/11/2017    Current Medications: Outpatient Medications Prior to Visit  Medication Sig Dispense Refill  . aspirin EC 81 MG EC tablet Take 1 tablet (81 mg total) by mouth daily. 30 tablet 1  . fluticasone furoate-vilanterol (BREO ELLIPTA) 200-25 MCG/INH AEPB Inhale 1  puff into the lungs daily. 1 each 5  . furosemide (LASIX) 80 MG tablet Take 1 tablet (80 mg total) by mouth daily. 90 tablet 3  . lidocaine-prilocaine (EMLA) cream Apply 1 application topically as needed. Apply small amount to port site 1-1.5 hours before treatment. Cover with plastic wrap. 30 g 0  . ondansetron (ZOFRAN) 8 MG tablet Take 1 tablet (8 mg total) by mouth every 8 (eight) hours as needed for nausea or vomiting. 30 tablet 0  . OXYGEN Inhale 4 L into the lungs continuous.     . potassium chloride SA (K-DUR,KLOR-CON) 20 MEQ tablet On Monday and Thursday,  take 4 tablets (80 mg) all other days take 3 tablets (60 meq) 276 tablet 3  . potassium chloride SA (K-DUR,KLOR-CON) 20 MEQ tablet Take 1 tablet (20 mEq total) by mouth daily. 10 tablet 0  . rivaroxaban (XARELTO) 20 MG TABS tablet Take 1 tablet (20 mg total) by mouth daily with supper. Begin 20 mg daily after starter pack is complete. 30 tablet 1  . metolazone (ZAROXOLYN) 2.5 MG tablet Take 2.5 mg 2 times a week, take on Monday and Thursday 24 tablet 3   No facility-administered medications prior to visit.      Allergies:   Bee venom and Penicillins   Social History   Socioeconomic History  . Marital status: Single    Spouse name: Not on file  . Number of children: Not on file  . Years of education: Not on file  . Highest education level: Not on file  Occupational History  . Not on file  Social Needs  . Financial resource strain: Not on file  . Food insecurity:    Worry: Not on file    Inability: Not on file  . Transportation needs:    Medical: Not on file    Non-medical: Not on file  Tobacco Use  . Smoking status: Former Smoker    Packs/day: 2.00    Years: 10.00    Pack years: 20.00    Types: Cigarettes    Last attempt to quit: 07/06/2012    Years since quitting: 6.1  . Smokeless tobacco: Former Systems developer    Types: [place] date: 07/06/2014  Substance and Sexual Activity  . Alcohol use: No  . Drug use: No  . Sexual activity: Never  Lifestyle  . Physical activity:    Days per week: Not on file    Minutes per session: Not on file  . Stress: Not on file  Relationships  . Social connections:    Talks on phone: Not on file    Gets together: Not on file    Attends religious service: Not on file    Active member of club or organization: Not on file    Attends meetings of clubs or organizations: Not on file    Relationship status: Not on file  Other Topics Concern  . Not on file  Social History Narrative  . Not on file     Family History:  The patient's family  history includes Asthma in his father; Diabetes in his mother; Hypertension in his father and mother; Prostate cancer in his father.   Review of Systems:   Please see the history of present illness.     General:  No chills, fever, night sweats or weight changes.  Cardiovascular:  No chest pain, orthopnea, palpitations, paroxysmal nocturnal dyspnea. Positive for dyspnea on exertion (at baseline) and edema.  Dermatological: No rash, lesions/masses Respiratory:  No cough, dyspnea Urologic: No hematuria, dysuria Abdominal:   No nausea, vomiting, diarrhea, bright red blood per rectum, melena, or hematemesis Neurologic:  No visual changes, wkns, changes in mental status. All other systems reviewed and are otherwise negative except as noted above.   Physical Exam:    VS:  BP 114/64   Pulse 100   Ht 5\' 6"  (1.676 m)   Wt 204 lb (92.5 kg)   SpO2 97%   BMI 32.93 kg/m    General: Well developed, well nourished [place] American male appearing in no acute distress. Head: Normocephalic, atraumatic, sclera non-icteric, no xanthomas, nares are without discharge.  Neck: No carotid bruits. JVD not elevated.  Lungs: Respirations regular and unlabored, without wheezes or rales. On 4L [place].  Heart: Regular rhythm, tachycardiac rate. No S3 or S4.  No murmur, no rubs, or gallops appreciated. Abdomen: Soft, non-tender, non-distended with normoactive bowel sounds. No hepatomegaly. No rebound/guarding. No obvious abdominal masses. Msk:  Strength and tone appear normal for age. No joint deformities or effusions. Extremities: No clubbing or cyanosis. Chronic 1+ pitting edema bilaterally, more pronounced along LLE.  Distal pedal pulses are 2+ bilaterally. Neuro: Alert and oriented X 3. Moves all extremities spontaneously. No focal deficits noted. Psych:  Responds to questions appropriately with a normal affect. Skin: No rashes or lesions noted  Wt Readings from Last 3 Encounters:  08/22/18 204 lb (92.5 kg)    08/13/18 191 lb 9.6 oz (86.9 kg)  07/31/18 196 lb 1.6 oz (89 kg)     Studies/Labs Reviewed:   EKG:  EKG is ordered today.  The ekg ordered today demonstrates sinus tachycardia, heart rate 100, borderline LVH, and no acute ST changes when compared to prior tracings.  Recent Labs: 07/23/2018: B Natriuretic Peptide 24.0 07/31/2018: TSH 0.232 08/13/2018: ALT 10; BUN 28; Creatinine 1.95; Hemoglobin 14.0; Platelet Count 211; Potassium 3.0; Sodium 140   Lipid Panel No results found for: CHOL, TRIG, HDL, CHOLHDL, VLDL, LDLCALC, LDLDIRECT  Additional studies/ records that were reviewed today include:   Echocardiogram: 07/2017 Study Conclusions  - Left ventricle: The cavity size was normal. Wall thickness was   normal. Systolic function was vigorous. The estimated ejection   fraction was in the range of 65% to 70%. Wall motion was normal;   there were no regional wall motion abnormalities. Doppler   parameters are consistent with abnormal left ventricular   relaxation (grade 1 diastolic dysfunction).  Assessment:    1. Chronic diastolic heart failure ([place])   2. Bilateral lower extremity edema   3. Essential hypertension   4. Chronic obstructive pulmonary disease, unspecified COPD type ([place])   5. Non-small cell lung cancer, unspecified laterality ([place])   6. Medication management      Plan:   In order of problems listed above:  1. Chronic Diastolic CHF/ Lower Extremity Edema - He has been noted to have worsening lower extremity edema over the past month and he was continued on Lasix 80 mg daily and started on Metolazone 2.5 mg twice weekly. The patient along with his family report that he has been noncompliant with this and frequently skips his medications for a few days and then will take them for several days in a row. BNP WNL at 24 when checked last month. Recent labs showed that his creatinine had trended upwards from 1.30 to 1.95 and he was hypokalemic with K+ at 3.0. Will  recheck BMET today. - Reviewed with the patient and his sister today and will plan  to replace a referral to [facility] for assistance in management of his CHF as his sister helps take care of several family members and is not able to check on him daily. I suspect his symptoms will improve with compliance of his medications. Will stop Metolazone for now until [facility] are arranged and can make sure he is taking medications appropriately as I am concerned his AKI and hypokalemia will continue to worsen if he takes Metolazone sporadically. He has refused to wear compression stockings in the past.  Importance of following daily weights was reviewed with the patient.  2. HTN - BP is well controlled at 114/64. No longer on anti-hypertensive medications.   3. COPD/ History of PE - He remains on 4 L nasal cannula at baseline. Saturations are appropriate at 97% during today's visit. Has a history of PE/DVT and remains on Xarelto for anticoagulation.  - followed by Pulmonology.   4. NSCLC - followed by Dr. Julien Nordmann.  He has completed chemotherapy treatment at this time and is in the observation phase by review of notes from 08/13/2018.     Medication Adjustments/Labs and Tests Ordered: Current medicines are reviewed at length with the patient today.  Concerns regarding medicines are outlined above.  Medication changes, Labs and Tests ordered today are listed in the Patient Instructions below. Patient Instructions  Medication Instructions:  Your physician has recommended you make the following change in your medication:  Stop Taking Metolazone   If you need a refill on your cardiac medications before your next appointment, please call your pharmacy.   Lab work: Your physician recommends that you return for lab work in: Today   If you have labs (blood work) drawn today and your tests are completely normal, you will receive your results only by: Marland Kitchen MyChart Message (if you have MyChart)  OR . A paper copy in the mail If you have any lab test that is abnormal or we need to change your treatment, we will call you to review the results.  Testing/Procedures: NONE   Follow-Up: At [facility], you and your health needs are our priority.  As part of our continuing mission to provide you with exceptional heart care, we have created designated Provider Care Teams.  These Care Teams include your primary Cardiologist (physician) and Advanced Practice Providers (APPs -  Physician Assistants and Nurse Practitioners) who all work together to provide you with the care you need, when you need it. You will need a follow up appointment in 6-8  weeks.  Please call our office 2 months in advance to schedule this appointment.  You may see James Carnes, MD or one of the following Advanced Practice Providers on your designated Care Team:   Bernerd Pho, PA-C [facility]) . Ermalinda Barrios, PA-C ([place])  Any Other Special Instructions Will Be Listed Below (If Applicable). Thank you for choosing [facility]!    Signed, Erma Heritage, PA-C  08/22/2018 5:37 PM    [place] [address] [place], [place] [address] Phone: [phone]

## 2018-08-22 NOTE — Patient Instructions (Signed)
Medication Instructions:  Your physician has recommended you make the following change in your medication:  Stop Taking Metolazone   If you need a refill on your cardiac medications before your next appointment, please call your pharmacy.   Lab work: Your physician recommends that you return for lab work in: Today   If you have labs (blood work) drawn today and your tests are completely normal, you will receive your results only by: Marland Kitchen MyChart Message (if you have MyChart) OR . A paper copy in the mail If you have any lab test that is abnormal or we need to change your treatment, we will call you to review the results.  Testing/Procedures: NONE   Follow-Up: At [facility], you and your health needs are our priority.  As part of our continuing mission to provide you with exceptional heart care, we have created designated Provider Care Teams.  These Care Teams include your primary Cardiologist (physician) and Advanced Practice Providers (APPs -  Physician Assistants and Nurse Practitioners) who all work together to provide you with the care you need, when you need it. You will need a follow up appointment in 6-8  weeks.  Please call our office 2 months in advance to schedule this appointment.  You may see [name], MD or one of the following Advanced Practice Providers on your designated Care Team:   Bernerd Pho, PA-C [facility]) . Ermalinda Barrios, PA-C ([place])  Any Other Special Instructions Will Be Listed Below (If Applicable). Thank you for choosing [facility]!

## 2018-08-28 ENCOUNTER — Other Ambulatory Visit (HOSPITAL_COMMUNITY)
Admission: RE | Admit: 2018-08-28 | Discharge: 2018-08-28 | Disposition: A | Discharge status: Home / Self Care | Payer: Medicare Other | Source: Ambulatory Visit | Attending: Student | Admitting: Student

## 2018-08-28 DIAGNOSIS — Z79899 Other long term (current) drug therapy: Secondary | ICD-10-CM | POA: Insufficient documentation

## 2018-08-28 DIAGNOSIS — I5032 Chronic diastolic (congestive) heart failure: Secondary | ICD-10-CM | POA: Diagnosis not present

## 2018-08-28 DIAGNOSIS — Z6832 Body mass index (BMI) 32.0-32.9, adult: Secondary | ICD-10-CM | POA: Diagnosis not present

## 2018-08-28 DIAGNOSIS — J449 Chronic obstructive pulmonary disease, unspecified: Secondary | ICD-10-CM | POA: Diagnosis not present

## 2018-08-28 DIAGNOSIS — I5033 Acute on chronic diastolic (congestive) heart failure: Secondary | ICD-10-CM | POA: Diagnosis not present

## 2018-08-28 DIAGNOSIS — C3492 Malignant neoplasm of unspecified part of left bronchus or lung: Secondary | ICD-10-CM | POA: Diagnosis not present

## 2018-08-28 DIAGNOSIS — E669 Obesity, unspecified: Secondary | ICD-10-CM | POA: Diagnosis not present

## 2018-08-28 DIAGNOSIS — I110 Hypertensive heart disease with heart failure: Secondary | ICD-10-CM | POA: Diagnosis not present

## 2018-08-28 LAB — BASIC METABOLIC PANEL
ANION GAP: 11 (ref 5–15)
BUN: 15 mg/dL (ref 8–23)
CALCIUM: 9.1 mg/dL (ref 8.9–10.3)
CO2: 30 mmol/L (ref 22–32)
Chloride: 95 mmol/L — ABNORMAL LOW (ref 98–111)
Creatinine, Ser: 1.28 mg/dL — ABNORMAL HIGH (ref 0.61–1.24)
GFR calc Af Amer: >60 mL/min (ref >60)
GFR, EST NON AFRICAN AMERICAN: 58 mL/min — AB (ref >60)
GLUCOSE: 147 mg/dL — AB (ref 70–99)
POTASSIUM: 3 mmol/L — AB (ref 3.5–5.1)
SODIUM: 136 mmol/L (ref 135–145)

## 2018-08-29 ENCOUNTER — Telehealth: Payer: Self-pay | Admitting: Internal Medicine

## 2018-08-29 NOTE — Telephone Encounter (Signed)
New Message      James Zamora with [facility] is calling to get skilled nursing orders  For :2 weeks four one week 3 for congestive heart failure. She also wants to see about getting a prescription for a lift chair.  She also states that he is on oxygen and he is depressed. She wants to see if they can get him a lighter oxygen tank. She believes that will improve his mood and get him more active because he is depressed. But he will not take a anti-depressant.  Please call to discuss.

## 2018-09-03 ENCOUNTER — Telehealth: Payer: Self-pay

## 2018-09-03 DIAGNOSIS — C3492 Malignant neoplasm of unspecified part of left bronchus or lung: Secondary | ICD-10-CM | POA: Diagnosis not present

## 2018-09-03 DIAGNOSIS — E669 Obesity, unspecified: Secondary | ICD-10-CM | POA: Diagnosis not present

## 2018-09-03 DIAGNOSIS — Z6832 Body mass index (BMI) 32.0-32.9, adult: Secondary | ICD-10-CM | POA: Diagnosis not present

## 2018-09-03 DIAGNOSIS — I5033 Acute on chronic diastolic (congestive) heart failure: Secondary | ICD-10-CM | POA: Diagnosis not present

## 2018-09-03 DIAGNOSIS — J449 Chronic obstructive pulmonary disease, unspecified: Secondary | ICD-10-CM | POA: Diagnosis not present

## 2018-09-03 DIAGNOSIS — I110 Hypertensive heart disease with heart failure: Secondary | ICD-10-CM | POA: Diagnosis not present

## 2018-09-03 NOTE — Telephone Encounter (Signed)
Spoke with sister per 11/5 vm return call. Patient appointment was r/s due to conflict of driver schedule (sister) and other family members provider appointments. Sister is care giver of 2 or more family memebers

## 2018-09-04 ENCOUNTER — Ambulatory Visit: Payer: Medicare Other | Admitting: Internal Medicine

## 2018-09-04 NOTE — Telephone Encounter (Signed)
OK to get prescriptions as noted.

## 2018-09-06 NOTE — Telephone Encounter (Signed)
Home Health Certification/Plan of Care received and placed in folder to be signed by Dr. Harrington Challenger.

## 2018-09-09 ENCOUNTER — Inpatient Hospital Stay: Payer: Medicare Other | Attending: Internal Medicine | Admitting: Internal Medicine

## 2018-09-09 VITALS — BP 117/77 | HR 105 | Temp 98.3°F | Resp 20 | Ht 66.0 in | Wt 203.8 lb

## 2018-09-09 DIAGNOSIS — Z79899 Other long term (current) drug therapy: Secondary | ICD-10-CM | POA: Insufficient documentation

## 2018-09-09 DIAGNOSIS — Z7982 Long term (current) use of aspirin: Secondary | ICD-10-CM | POA: Insufficient documentation

## 2018-09-09 DIAGNOSIS — R51 Headache: Secondary | ICD-10-CM | POA: Diagnosis present

## 2018-09-09 DIAGNOSIS — Z87891 Personal history of nicotine dependence: Secondary | ICD-10-CM | POA: Insufficient documentation

## 2018-09-09 DIAGNOSIS — C3492 Malignant neoplasm of unspecified part of left bronchus or lung: Secondary | ICD-10-CM | POA: Diagnosis not present

## 2018-09-09 DIAGNOSIS — R251 Tremor, unspecified: Secondary | ICD-10-CM | POA: Insufficient documentation

## 2018-09-09 NOTE — Progress Notes (Signed)
[place] at [facility] [place], [place] [address] [phone]   Interval Evaluation  Date of Service: 09/09/18 Patient Name: James Zamora Patient MRN: [id number] Patient DOB: Feb 24, 1956 Provider: Ventura Sellers, MD  Identifying Statement:  James Zamora is a 62 y.o. male with tremor  Primary Cancer: NSCLC stage III  Interval History:  James Zamora presents for follow up.  He describes continuation of improvement in his tremors, stiffness, and slow movements.  He denies new or progressive neurologic deficits, seizure or headaches. He is currently on observation for lung cancer.  Medications: Current Outpatient Medications on File Prior to Visit  Medication Sig Dispense Refill  . aspirin EC 81 MG EC tablet Take 1 tablet (81 mg total) by mouth daily. 30 tablet 1  . fluticasone furoate-vilanterol (BREO ELLIPTA) 200-25 MCG/INH AEPB Inhale 1 puff into the lungs daily. 1 each 5  . furosemide (LASIX) 80 MG tablet Take 1 tablet (80 mg total) by mouth daily. 90 tablet 3  . lidocaine-prilocaine (EMLA) cream Apply 1 application topically as needed. Apply small amount to port site 1-1.5 hours before treatment. Cover with plastic wrap. 30 g 0  . ondansetron (ZOFRAN) 8 MG tablet Take 1 tablet (8 mg total) by mouth every 8 (eight) hours as needed for nausea or vomiting. 30 tablet 0  . OXYGEN Inhale 4 L into the lungs continuous.     . potassium chloride SA (K-DUR,KLOR-CON) 20 MEQ tablet On Monday and Thursday, take 4 tablets (80 mg) all other days take 3 tablets (60 meq) 276 tablet 3  . potassium chloride SA (K-DUR,KLOR-CON) 20 MEQ tablet Take 1 tablet (20 mEq total) by mouth daily. 10 tablet 0  . rivaroxaban (XARELTO) 20 MG TABS tablet Take 1 tablet (20 mg total) by mouth daily with supper. Begin 20 mg daily after starter pack is complete. 30 tablet 1   No current facility-administered medications on file prior to visit.      Allergies:  Allergies  Allergen Reactions  . Bee Venom Anaphylaxis  . Penicillins Itching and Swelling    Pt has tolerated cephalosporins in the past Has patient had a PCN reaction causing immediate rash, facial/tongue/throat swelling, SOB or lightheadedness with hypotension: unknown Has patient had a PCN reaction causing severe rash involving mucus membranes or skin necrosis: unknown Has patient had a PCN reaction that required hospitalization: unknown Has patient had a PCN reaction occurring within the last 10 years: unknown If all of the above answers are "NO", then may proceed with Cephalosporin [place]   Past Medical History:  Past Medical History:  Diagnosis Date  . Adenocarcinoma of left lung, stage 3 ([place]) 05/10/2017  . Aortic atherosclerosis ([place]) 07/31/2017  . Asthma   . Chronic diastolic CHF (congestive heart failure) ([place])    a. 07/2017: echo showing EF of 65-70% with Grade 1 DD  . Chronic fatigue 08/02/2017  . Lung mass   . Non-small cell lung cancer ([place]) 11/13/2015  . Obesity   . Perforated bowel [facility])    Past Surgical History:  Past Surgical History:  Procedure Laterality Date  . CHOLECYSTECTOMY    . COLONOSCOPY N/A 07/31/2016   Procedure: COLONOSCOPY;  Surgeon: Danie Binder, MD;  Location[facility];  Service: Endoscopy;  Laterality: N/A;  1:45 pm  . ESOPHAGOGASTRODUODENOSCOPY N/A 07/31/2016   Procedure: ESOPHAGOGASTRODUODENOSCOPY (EGD);  Surgeon: Danie Binder, MD;  Location: [facility] ENDO SUITE;  Service: Endoscopy;  Laterality: N/A;  .  EUS N/A 06/07/2017   Procedure: UPPER ENDOSCOPIC ULTRASOUND (EUS) LINEAR;  Surgeon: Milus Banister, MD;  Location: [facility] ENDOSCOPY;  Service: Endoscopy;  Laterality: N/A;  . HERNIA REPAIR    . IR FLUORO GUIDE PORT INSERTION RIGHT  07/11/2017  . IR US GUIDE VASC ACCESS RIGHT  07/11/2017   Social History:  Social History   Socioeconomic History  . Marital status: Single    Spouse name: Not on file  . Number of children: Not on  file  . Years of education: Not on file  . Highest education level: Not on file  Occupational History  . Not on file  Social Needs  . Financial resource strain: Not on file  . Food insecurity:    Worry: Not on file    Inability: Not on file  . Transportation needs:    Medical: Not on file    Non-medical: Not on file  Tobacco Use  . Smoking status: Former Smoker    Packs/day: 2.00    Years: 10.00    Pack years: 20.00    Types: Cigarettes    Last attempt to quit: 07/06/2012    Years since quitting: 6.1  . Smokeless tobacco: Former Systems developer    Types: [place] date: 07/06/2014  Substance and Sexual Activity  . Alcohol use: No  . Drug use: No  . Sexual activity: Never  Lifestyle  . Physical activity:    Days per week: Not on file    Minutes per session: Not on file  . Stress: Not on file  Relationships  . Social connections:    Talks on phone: Not on file    Gets together: Not on file    Attends religious service: Not on file    Active member of club or organization: Not on file    Attends meetings of clubs or organizations: Not on file    Relationship status: Not on file  . Intimate partner violence:    Fear of current or ex partner: Not on file    Emotionally abused: Not on file    Physically abused: Not on file    Forced sexual activity: Not on file  Other Topics Concern  . Not on file  Social History Narrative  . Not on file   Family History:  Family History  Problem Relation Age of Onset  . Diabetes Mother   . Hypertension Mother   . Asthma Father   . Prostate cancer Father   . Hypertension Father   . Colon cancer Neg Hx     Review of Systems: Constitutional: Denies fevers, chills or abnormal weight loss Eyes: Denies blurriness of vision Ears, nose, mouth, throat, and face: Denies mucositis or sore throat Respiratory: Denies cough, dyspnea or wheezes Cardiovascular: Denies palpitation, chest discomfort or lower extremity swelling Gastrointestinal:   Denies nausea, constipation, diarrhea GU: Denies dysuria or incontinence Skin: Denies abnormal skin rashes Neurological: Per HPI Musculoskeletal: +joint pain Behavioral/Psych: Denies anxiety, disturbance in thought content, and mood instability   Physical Exam: Vitals:   09/09/18 1234  BP: 117/77  Pulse: (!) 105  Resp: 20  Temp: 98.3 F (36.8 C)  SpO2: (!) 82%   KPS: 80. General: Alert, cooperative, pleasant, in no acute distress.  On supplemental O2. Head: Normal EENT: No conjunctival injection or scleral icterus. Oral mucosa moist Lungs: Resp effort normal Cardiac: Regular rate and rhythm Abdomen: Soft, non-distended abdomen Skin: No rashes cyanosis or petechiae. Extremities: No clubbing or edema  Neurologic  Exam: Mental Status: Awake, alert, attentive to examiner. Oriented to self and environment. Language is fluent with intact comprehension.   Cranial Nerves: Visual acuity is grossly normal. Visual fields are full. Extra-ocular movements intact. No ptosis. Face is symmetric, tongue midline. Motor: Tone and bulk are normal. Power is full in both arms and legs, mild cogwheeling noted in right arm on passive flexion. Reflexes are symmetric, no pathologic reflexes present. Intact finger to nose bilaterally.  Poly-mini-myoclonus appreciated on fixed posturing, but no tremor  Sensory: Intact to light touch and temperature Gait: Independent   Labs: I have reviewed the data as listed    Component Value Date/Time   NA 136 08/28/2018 1554   NA 139 March 27, 202018 1313   K 3.0 (L) 08/28/2018 1554   K 3.8 March 27, 202018 1313   CL 95 (L) 08/28/2018 1554   CO2 30 08/28/2018 1554   CO2 27 March 27, 202018 1313   GLUCOSE 147 (H) 08/28/2018 1554   GLUCOSE 89 March 27, 202018 1313   BUN 15 08/28/2018 1554   BUN 9.8 March 27, 202018 1313   CREATININE 1.28 (H) 08/28/2018 1554   CREATININE 1.95 (H) 08/13/2018 1102   CREATININE 0.9 March 27, 202018 1313   CALCIUM 9.1 08/28/2018 1554   CALCIUM 8.6 March 27, 202018 1313    PROT 9.1 (H) 08/13/2018 1102   PROT 6.7 March 27, 202018 1313   ALBUMIN 4.0 08/13/2018 1102   ALBUMIN 2.7 (L) March 27, 202018 1313   AST 14 (L) 08/13/2018 1102   AST 9 March 27, 202018 1313   ALT 10 08/13/2018 1102   ALT 6 March 27, 202018 1313   ALKPHOS 115 08/13/2018 1102   ALKPHOS 57 March 27, 202018 1313   BILITOT 0.4 08/13/2018 1102   BILITOT 0.23 March 27, 202018 1313   GFRNONAA 58 (L) 08/28/2018 1554   GFRNONAA 35 (L) 08/13/2018 1102   GFRAA >60 08/28/2018 1554   GFRAA 41 (L) 08/13/2018 1102   Lab Results  Component Value Date   WBC 5.3 08/13/2018   NEUTROABS 3.0 08/13/2018   HGB 14.0 08/13/2018   HCT 44.9 08/13/2018   MCV 78.0 (L) 08/13/2018   PLT 211 08/13/2018    Assessment/Plan 1. Tremor  James Zamora is clinically stable today with only very mild parkinsonian features on exam.  No need for dopaminergic therapy at this time.    If symptoms worsen or evolve, he will contact us for further evaluation.  We appreciate the opportunity to participate in the care of James Zamora.   All questions were answered. The patient knows to call the clinic with any problems, questions or concerns. No barriers to learning were detected.  The total time spent in the encounter was 25 minutes and more than 50% was on counseling and review of test results   Ventura Sellers, MD Medical Director of Neuro-Oncology [facility] at [place] 09/09/18 12:21 PM

## 2018-09-10 ENCOUNTER — Telehealth: Payer: Self-pay

## 2018-09-10 ENCOUNTER — Telehealth: Payer: Self-pay | Admitting: Internal Medicine

## 2018-09-10 DIAGNOSIS — Z6832 Body mass index (BMI) 32.0-32.9, adult: Secondary | ICD-10-CM | POA: Diagnosis not present

## 2018-09-10 DIAGNOSIS — E669 Obesity, unspecified: Secondary | ICD-10-CM | POA: Diagnosis not present

## 2018-09-10 DIAGNOSIS — J449 Chronic obstructive pulmonary disease, unspecified: Secondary | ICD-10-CM | POA: Diagnosis not present

## 2018-09-10 DIAGNOSIS — I110 Hypertensive heart disease with heart failure: Secondary | ICD-10-CM | POA: Diagnosis not present

## 2018-09-10 DIAGNOSIS — C3492 Malignant neoplasm of unspecified part of left bronchus or lung: Secondary | ICD-10-CM | POA: Diagnosis not present

## 2018-09-10 DIAGNOSIS — I5033 Acute on chronic diastolic (congestive) heart failure: Secondary | ICD-10-CM | POA: Diagnosis not present

## 2018-09-10 NOTE — Telephone Encounter (Signed)
Per 11/11 no los

## 2018-09-10 NOTE — Telephone Encounter (Signed)
   Would recommend that he take an additional Lasix 40 mg this evening and an extra 40 mg tomorrow evening in addition to his scheduled 80 mg daily.  Would continue to follow daily weights and encourage a low-sodium diet. He was previously requiring PRN Metolazone and may require this again in the future but was discontinued at the time of his last visit due to his AKI and variable medication compliance.  Signed, Erma Heritage, PA-C 09/10/2018, 4:17 PM Pager: [phone]

## 2018-09-10 NOTE — Telephone Encounter (Signed)
LM with Kathlee Nations with B.Strader's instructions, asked to call back if she had questions

## 2018-09-10 NOTE — Telephone Encounter (Signed)
I will forward to [place],  PA-C for instructions

## 2018-09-10 NOTE — Telephone Encounter (Signed)
Kathlee Nations w/ [facility] called stating pt weighs 202.2lbs today and yesterday he was 197.4lbs  Ate [employer]&W last night and he's on 80mg  lasix 1x a day   Please call Kathlee Nations @ [phone]

## 2018-09-12 NOTE — Telephone Encounter (Signed)
James Zamora states she got my message

## 2018-09-17 DIAGNOSIS — J449 Chronic obstructive pulmonary disease, unspecified: Secondary | ICD-10-CM | POA: Diagnosis not present

## 2018-09-17 DIAGNOSIS — I5033 Acute on chronic diastolic (congestive) heart failure: Secondary | ICD-10-CM | POA: Diagnosis not present

## 2018-09-17 DIAGNOSIS — E669 Obesity, unspecified: Secondary | ICD-10-CM | POA: Diagnosis not present

## 2018-09-17 DIAGNOSIS — I110 Hypertensive heart disease with heart failure: Secondary | ICD-10-CM | POA: Diagnosis not present

## 2018-09-17 DIAGNOSIS — C3492 Malignant neoplasm of unspecified part of left bronchus or lung: Secondary | ICD-10-CM | POA: Diagnosis not present

## 2018-09-17 DIAGNOSIS — Z6832 Body mass index (BMI) 32.0-32.9, adult: Secondary | ICD-10-CM | POA: Diagnosis not present

## 2018-09-19 DIAGNOSIS — Z6832 Body mass index (BMI) 32.0-32.9, adult: Secondary | ICD-10-CM | POA: Diagnosis not present

## 2018-09-19 DIAGNOSIS — C3492 Malignant neoplasm of unspecified part of left bronchus or lung: Secondary | ICD-10-CM | POA: Diagnosis not present

## 2018-09-19 DIAGNOSIS — E669 Obesity, unspecified: Secondary | ICD-10-CM | POA: Diagnosis not present

## 2018-09-19 DIAGNOSIS — J449 Chronic obstructive pulmonary disease, unspecified: Secondary | ICD-10-CM | POA: Diagnosis not present

## 2018-09-19 DIAGNOSIS — I110 Hypertensive heart disease with heart failure: Secondary | ICD-10-CM | POA: Diagnosis not present

## 2018-09-19 DIAGNOSIS — I5033 Acute on chronic diastolic (congestive) heart failure: Secondary | ICD-10-CM | POA: Diagnosis not present

## 2018-09-24 DIAGNOSIS — I5033 Acute on chronic diastolic (congestive) heart failure: Secondary | ICD-10-CM | POA: Diagnosis not present

## 2018-09-24 DIAGNOSIS — Z6832 Body mass index (BMI) 32.0-32.9, adult: Secondary | ICD-10-CM | POA: Diagnosis not present

## 2018-09-24 DIAGNOSIS — J449 Chronic obstructive pulmonary disease, unspecified: Secondary | ICD-10-CM | POA: Diagnosis not present

## 2018-09-24 DIAGNOSIS — E669 Obesity, unspecified: Secondary | ICD-10-CM | POA: Diagnosis not present

## 2018-09-24 DIAGNOSIS — I110 Hypertensive heart disease with heart failure: Secondary | ICD-10-CM | POA: Diagnosis not present

## 2018-09-24 DIAGNOSIS — C3492 Malignant neoplasm of unspecified part of left bronchus or lung: Secondary | ICD-10-CM | POA: Diagnosis not present

## 2018-10-01 DIAGNOSIS — C3432 Malignant neoplasm of lower lobe, left bronchus or lung: Secondary | ICD-10-CM | POA: Diagnosis not present

## 2018-10-01 DIAGNOSIS — Z9981 Dependence on supplemental oxygen: Secondary | ICD-10-CM | POA: Diagnosis not present

## 2018-10-01 DIAGNOSIS — J449 Chronic obstructive pulmonary disease, unspecified: Secondary | ICD-10-CM | POA: Diagnosis not present

## 2018-10-01 DIAGNOSIS — Z7901 Long term (current) use of anticoagulants: Secondary | ICD-10-CM | POA: Diagnosis not present

## 2018-10-01 DIAGNOSIS — I5032 Chronic diastolic (congestive) heart failure: Secondary | ICD-10-CM | POA: Diagnosis not present

## 2018-10-02 DIAGNOSIS — E669 Obesity, unspecified: Secondary | ICD-10-CM | POA: Diagnosis not present

## 2018-10-02 DIAGNOSIS — J449 Chronic obstructive pulmonary disease, unspecified: Secondary | ICD-10-CM | POA: Diagnosis not present

## 2018-10-02 DIAGNOSIS — C3492 Malignant neoplasm of unspecified part of left bronchus or lung: Secondary | ICD-10-CM | POA: Diagnosis not present

## 2018-10-02 DIAGNOSIS — Z6832 Body mass index (BMI) 32.0-32.9, adult: Secondary | ICD-10-CM | POA: Diagnosis not present

## 2018-10-02 DIAGNOSIS — I5033 Acute on chronic diastolic (congestive) heart failure: Secondary | ICD-10-CM | POA: Diagnosis not present

## 2018-10-02 DIAGNOSIS — I110 Hypertensive heart disease with heart failure: Secondary | ICD-10-CM | POA: Diagnosis not present

## 2018-10-24 DIAGNOSIS — I110 Hypertensive heart disease with heart failure: Secondary | ICD-10-CM | POA: Diagnosis not present

## 2018-10-24 DIAGNOSIS — E669 Obesity, unspecified: Secondary | ICD-10-CM | POA: Diagnosis not present

## 2018-10-24 DIAGNOSIS — Z6832 Body mass index (BMI) 32.0-32.9, adult: Secondary | ICD-10-CM | POA: Diagnosis not present

## 2018-10-24 DIAGNOSIS — C3492 Malignant neoplasm of unspecified part of left bronchus or lung: Secondary | ICD-10-CM | POA: Diagnosis not present

## 2018-10-24 DIAGNOSIS — I5033 Acute on chronic diastolic (congestive) heart failure: Secondary | ICD-10-CM | POA: Diagnosis not present

## 2018-10-24 DIAGNOSIS — J449 Chronic obstructive pulmonary disease, unspecified: Secondary | ICD-10-CM | POA: Diagnosis not present

## 2018-10-28 ENCOUNTER — Inpatient Hospital Stay: Admission: AD | Admit: 2018-10-28 | Payer: Medicare Other | Source: Ambulatory Visit | Admitting: Internal Medicine

## 2018-10-28 ENCOUNTER — Other Ambulatory Visit (HOSPITAL_COMMUNITY)
Admission: RE | Admit: 2018-10-28 | Discharge: 2018-10-28 | Disposition: A | Discharge status: Home / Self Care | Payer: Medicare Other | Source: Ambulatory Visit | Attending: Internal Medicine | Admitting: Internal Medicine

## 2018-10-28 ENCOUNTER — Ambulatory Visit (INDEPENDENT_AMBULATORY_CARE_PROVIDER_SITE_OTHER): Payer: Medicare Other | Admitting: Internal Medicine

## 2018-10-28 ENCOUNTER — Encounter: Payer: Self-pay | Admitting: Internal Medicine

## 2018-10-28 VITALS — BP 110/66 | HR 110 | Ht 66.0 in | Wt 205.0 lb

## 2018-10-28 DIAGNOSIS — I5031 Acute diastolic (congestive) heart failure: Secondary | ICD-10-CM

## 2018-10-28 LAB — CBC WITH DIFFERENTIAL/PLATELET
Abs Immature Granulocytes: 0.02 10*3/uL (ref 0.00–0.07)
Basophils Absolute: 0 10*3/uL (ref 0.0–0.1)
Basophils Relative: 1 %
Eosinophils Absolute: 0.2 10*3/uL (ref 0.0–0.5)
Eosinophils Relative: 3 %
HCT: 42.4 % (ref 39.0–52.0)
Hemoglobin: 12.6 g/dL — ABNORMAL LOW (ref 13.0–17.0)
Immature Granulocytes: 0 %
Lymphocytes Relative: 22 %
Lymphs Abs: 1.5 10*3/uL (ref 0.7–4.0)
MCH: 24.7 pg — ABNORMAL LOW (ref 26.0–34.0)
MCHC: 29.7 g/dL — ABNORMAL LOW (ref 30.0–36.0)
MCV: 83.1 fL (ref 80.0–100.0)
MONOS PCT: 9 %
Monocytes Absolute: 0.6 10*3/uL (ref 0.1–1.0)
NEUTROS PCT: 65 %
Neutro Abs: 4.4 10*3/uL (ref 1.7–7.7)
Platelets: 255 10*3/uL (ref 150–400)
RBC: 5.1 MIL/uL (ref 4.22–5.81)
RDW: 17.1 % — ABNORMAL HIGH (ref 11.5–15.5)
WBC: 6.8 10*3/uL (ref 4.0–10.5)
nRBC: 0 % (ref 0.0–0.2)

## 2018-10-28 LAB — TSH: TSH: 1.163 u[IU]/mL (ref 0.350–4.500)

## 2018-10-28 LAB — BASIC METABOLIC PANEL
Anion gap: 8 (ref 5–15)
BUN: 26 mg/dL — ABNORMAL HIGH (ref 8–23)
CO2: 30 mmol/L (ref 22–32)
Calcium: 9.3 mg/dL (ref 8.9–10.3)
Chloride: 98 mmol/L (ref 98–111)
Creatinine, Ser: 1.49 mg/dL — ABNORMAL HIGH (ref 0.61–1.24)
GFR calc Af Amer: 57 mL/min — ABNORMAL LOW (ref >60)
GFR calc non Af Amer: 50 mL/min — ABNORMAL LOW (ref >60)
GLUCOSE: 96 mg/dL (ref 70–99)
Potassium: 5.2 mmol/L — ABNORMAL HIGH (ref 3.5–5.1)
Sodium: 136 mmol/L (ref 135–145)

## 2018-10-28 LAB — BRAIN NATRIURETIC PEPTIDE: B Natriuretic Peptide: 25 pg/mL (ref 0.0–100.0)

## 2018-10-28 NOTE — Patient Instructions (Signed)
Medication Instructions:  Your physician recommends that you continue on your current medications as directed. Please refer to the Current Medication list given to you today.  If you need a refill on your cardiac medications before your next appointment, please call your pharmacy.   Lab work: Your physician recommends that you return for lab work in: Today   If you have labs (blood work) drawn today and your tests are completely normal, you will receive your results only by: Marland Kitchen MyChart Message (if you have MyChart) OR . A paper copy in the mail If you have any lab test that is abnormal or we need to change your treatment, we will call you to review the results.  Testing/Procedures: Your physician has requested that you have an echocardiogram. Echocardiography is a painless test that uses sound waves to create images of your heart. It provides your doctor with information about the size and shape of your heart and how well your heart's chambers and valves are working. This procedure takes approximately one hour. There are no restrictions for this procedure.    Follow-Up: At [facility], you and your health needs are our priority.  As part of our continuing mission to provide you with exceptional heart care, we have created designated Provider Care Teams.  These Care Teams include your primary Cardiologist (physician) and Advanced Practice Providers (APPs -  Physician Assistants and Nurse Practitioners) who all work together to provide you with the care you need, when you need it. You will need a follow up appointment in pending test results. Please call our office 2 months in advance to schedule this appointment.  You may see [name], MD or one of the following Advanced Practice Providers on your designated Care Team:   Bernerd Pho, PA-C [facility]) . Ermalinda Barrios, PA-C ([place])  Any Other Special Instructions Will Be Listed Below (If Applicable).  Thank you for  choosing [place]!

## 2018-10-28 NOTE — Progress Notes (Signed)
Cardiology Office Note   Date:  10/28/2018   ID:  Ott, Zimmerle 03-15-1956, MRN [id number]  PCP:  [name], MD  Cardiologist:   [name], MD   Pt presents for f/u of disatolic CHF     History of Present Illness   James Zamora is a 62 y.o. male with a history of dyspnea, HTN COPD, diastoli CHF, Stage III adenocarcinoma of the lung (undergoing consolidation Rx) Hx of PE   and   sleep apnea   I saw him in April 2019    Volume appeared up at time  Since I saw him he has been seen twice by B Strader in clinic   Last visit was May 30th  From these visit it was felt that pt was not being compliant with Rx and diet   The pt comes in today using 4 L oxygen   He says his breathing is OK   He denies CP   Says his ankles go up and down some    He is here with his sister   She says he doesn't do much   Says he is eating salty foods   Feet and ankles swollen, sometimes bad     Sleeps in chair The patient's sister also says he is not taking Zaroxylyn as was prescribed     Current Meds  Medication Sig  . aspirin EC 81 MG EC tablet Take 1 tablet (81 mg total) by mouth daily.  . fluticasone furoate-vilanterol (BREO ELLIPTA) 200-25 MCG/INH AEPB Inhale 1 puff into the lungs daily.  . furosemide (LASIX) 80 MG tablet Take 1 tablet (80 mg total) by mouth daily.  Marland Kitchen lidocaine-prilocaine (EMLA) cream Apply 1 application topically as needed. Apply small amount to port site 1-1.5 hours before treatment. Cover with plastic wrap.  . ondansetron (ZOFRAN) 8 MG tablet Take 1 tablet (8 mg total) by mouth every 8 (eight) hours as needed for nausea or vomiting.  . OXYGEN Inhale 4 L into the lungs continuous.   . potassium chloride SA (K-DUR,KLOR-CON) 20 MEQ tablet On Monday and Thursday, take 4 tablets (80 mg) all other days take 3 tablets (60 meq)  . potassium chloride SA (K-DUR,KLOR-CON) 20 MEQ tablet Take 1 tablet (20 mEq total) by mouth daily.  . rivaroxaban (XARELTO) 20 MG TABS tablet  Take 1 tablet (20 mg total) by mouth daily with supper. Begin 20 mg daily after starter pack is complete.     Allergies:   Bee venom and Penicillins   Past Medical History:  Diagnosis Date  . Adenocarcinoma of left lung, stage 3 ([place]) 05/10/2017  . Aortic atherosclerosis ([place]) 07/31/2017  . Asthma   . Chronic diastolic CHF (congestive heart failure) ([place])    a. 07/2017: echo showing EF of 65-70% with Grade 1 DD  . Chronic fatigue 08/02/2017  . Lung mass   . Non-small cell lung cancer ([place]) 11/13/2015  . Obesity   . Perforated bowel [facility])     Past Surgical History:  Procedure Laterality Date  . CHOLECYSTECTOMY    . COLONOSCOPY N/A 07/31/2016   Procedure: COLONOSCOPY;  Surgeon: Danie Binder, MD;  [facility];  Service: Endoscopy;  Laterality: N/A;  1:45 pm  . ESOPHAGOGASTRODUODENOSCOPY N/A 07/31/2016   Procedure: ESOPHAGOGASTRODUODENOSCOPY (EGD);  Surgeon: Danie Binder, MD;  Location[facility];  Service: Endoscopy;  Laterality: N/A;  . EUS N/A 06/07/2017   Procedure: UPPER ENDOSCOPIC ULTRASOUND (EUS) LINEAR;  Surgeon: Owens Loffler  P, MD;  Location: [facility];  Service: Endoscopy;  Laterality: N/A;  . HERNIA REPAIR    . IR FLUORO GUIDE PORT INSERTION RIGHT  07/11/2017  . IR US GUIDE VASC ACCESS RIGHT  07/11/2017     Social History:  The patient  reports that he quit smoking about 6 years ago. His smoking use included cigarettes. He has a 20.00 pack-year smoking history. He quit smokeless tobacco use about 4 years ago.  His smokeless tobacco use included chew. He reports that he does not drink alcohol or use drugs.   Family History:  The patient's family history includes Asthma in his father; Diabetes in his mother; Hypertension in his father and mother; Prostate cancer in his father.    ROS:  Please see the history of present illness. All other systems are reviewed and  Negative to the above problem except as noted.    PHYSICAL EXAM: VS:  BP 110/66   Pulse  (!) 110   Ht 5\' 6"  (1.676 m)   Wt 205 lb (93 kg)   SpO2 92%   BMI 33.09 kg/m   Sats 79 % with walking (on 4 L)   Patient denies SOB   GEN: PT is quiet 62 yo , in no acute distress  HEENT: normal  Neck: JVP is elevated    No carotid bruits, or masses Cardiac: RRR; no murmurs, rubs, or gallops, 2+ LE edema     Respiratory:  Rhonchi bilater  Overall decreased airflow   GI: soft, nontender, nondistended, + BS  No hepatomegaly  MS: no deformity Moving all extremities   Skin: warm and dry, no rash Neuro:  Strength and sensation are intact Psych: euthymic mood, full affect   EKG:  EKG is not ordered today.   Lipid Panel No results found for: CHOL, TRIG, HDL, CHOLHDL, VLDL, LDLCALC, LDLDIRECT    Wt Readings from Last 3 Encounters:  10/28/18 205 lb (93 kg)  09/09/18 203 lb 12.8 oz (92.4 kg)  08/22/18 204 lb (92.5 kg)      ASSESSMENT AND PLAN:  1  Acute on chronic  CHF    CHF is presumed diastolic though echo has not been done recnetly   Sister says he eats poorly I have disucssed wth pt and his sister. Sats are low with walking   FLuid may be contrib though the patient has signif emphysema  Stressed with him that dietary Compliance is important    With renal function may be difficult to diurese as an outpt  Would consder admission   Pt is reluctant to do this    Will get labs today   Alos set up for echo Get f/u in clinic in not to distant future to assess respons   2  Dyspnea  Pt down plays   Sats go to 70s with walking (on 4 L [place])     3   Lung CA   Followed in oncology clinic   4  HTN   BP is good   5  Hx PE   Continue Xarelto    Current medicines are reviewed at length with the patient today.  The patient does not have concerns regarding medicines.  Signed, [name], MD  10/28/2018 2:10 PM    [address], [place], [place]  [address] Phone: [phone]; Fax: [phone]   53

## 2018-10-29 ENCOUNTER — Telehealth: Payer: Self-pay | Admitting: *Deleted

## 2018-10-29 DIAGNOSIS — Z79899 Other long term (current) drug therapy: Secondary | ICD-10-CM

## 2018-10-29 MED ORDER — METOLAZONE 2.5 MG PO TABS: 2.5000 mg | ORAL_TABLET | ORAL | 3 refills | Status: DC

## 2018-10-29 MED ORDER — POTASSIUM CHLORIDE CRYS ER 20 MEQ PO TBCR: 20.0000 meq | EXTENDED_RELEASE_TABLET | ORAL | 3 refills | Status: AC

## 2018-10-29 NOTE — Telephone Encounter (Signed)
-----   Message from Bernita Raisin, RN sent at 10/29/2018  2:06 PM EST -----  ----- Message ----- From: Fay Records, MD Sent: 10/29/2018   1:02 PM EST To: Bernita Raisin, RN  Thyroid function is normal CBC:   Pt is not anemic Electrolytes   K a little high   Take 20 KCL every other day (not 10 daily) Would add 2.5 mg Zaroxylyn every other day to see if this helps get fluid off   (take 30 min prior to lasix)  Take KCL on these days 2 gram Na   1.5-1.8 L fluids  BMET in 10 days  Echo pending

## 2018-11-01 ENCOUNTER — Ambulatory Visit (HOSPITAL_COMMUNITY)
Admission: RE | Admit: 2018-11-01 | Discharge: 2018-11-01 | Disposition: A | Discharge status: Home / Self Care | Payer: Medicare Other | Source: Ambulatory Visit | Attending: Internal Medicine | Admitting: Internal Medicine

## 2018-11-01 DIAGNOSIS — I5031 Acute diastolic (congestive) heart failure: Secondary | ICD-10-CM | POA: Diagnosis not present

## 2018-11-01 NOTE — Progress Notes (Signed)
*  PRELIMINARY RESULTS* Echocardiogram 2D Echocardiogram has been performed.  James Zamora 11/01/2018, 11:05 AM

## 2018-11-11 ENCOUNTER — Ambulatory Visit (HOSPITAL_COMMUNITY)
Admission: RE | Admit: 2018-11-11 | Discharge: 2018-11-11 | Disposition: A | Discharge status: Home / Self Care | Payer: Medicare Other | Source: Ambulatory Visit | Attending: Internal Medicine | Admitting: Internal Medicine

## 2018-11-11 ENCOUNTER — Other Ambulatory Visit: Payer: Medicare Other

## 2018-11-11 DIAGNOSIS — C3490 Malignant neoplasm of unspecified part of unspecified bronchus or lung: Secondary | ICD-10-CM | POA: Insufficient documentation

## 2018-11-13 ENCOUNTER — Telehealth: Payer: Self-pay

## 2018-11-13 ENCOUNTER — Encounter: Payer: Self-pay | Admitting: Internal Medicine

## 2018-11-13 ENCOUNTER — Inpatient Hospital Stay: Payer: Medicare Other | Attending: Internal Medicine | Admitting: Internal Medicine

## 2018-11-13 ENCOUNTER — Inpatient Hospital Stay: Payer: Medicare Other

## 2018-11-13 ENCOUNTER — Other Ambulatory Visit: Payer: Self-pay | Admitting: *Deleted

## 2018-11-13 VITALS — BP 133/82 | HR 100 | Temp 97.6°F | Resp 18 | Ht 66.0 in | Wt 206.2 lb

## 2018-11-13 DIAGNOSIS — C3490 Malignant neoplasm of unspecified part of unspecified bronchus or lung: Secondary | ICD-10-CM

## 2018-11-13 DIAGNOSIS — E669 Obesity, unspecified: Secondary | ICD-10-CM

## 2018-11-13 DIAGNOSIS — Z79899 Other long term (current) drug therapy: Secondary | ICD-10-CM

## 2018-11-13 DIAGNOSIS — Z7901 Long term (current) use of anticoagulants: Secondary | ICD-10-CM | POA: Insufficient documentation

## 2018-11-13 DIAGNOSIS — I5020 Unspecified systolic (congestive) heart failure: Secondary | ICD-10-CM

## 2018-11-13 DIAGNOSIS — C771 Secondary and unspecified malignant neoplasm of intrathoracic lymph nodes: Secondary | ICD-10-CM | POA: Diagnosis not present

## 2018-11-13 DIAGNOSIS — Z9221 Personal history of antineoplastic chemotherapy: Secondary | ICD-10-CM | POA: Insufficient documentation

## 2018-11-13 DIAGNOSIS — C3432 Malignant neoplasm of lower lobe, left bronchus or lung: Secondary | ICD-10-CM

## 2018-11-13 DIAGNOSIS — Z7982 Long term (current) use of aspirin: Secondary | ICD-10-CM | POA: Insufficient documentation

## 2018-11-13 DIAGNOSIS — J449 Chronic obstructive pulmonary disease, unspecified: Secondary | ICD-10-CM | POA: Diagnosis not present

## 2018-11-13 DIAGNOSIS — R600 Localized edema: Secondary | ICD-10-CM | POA: Diagnosis not present

## 2018-11-13 DIAGNOSIS — Z923 Personal history of irradiation: Secondary | ICD-10-CM | POA: Insufficient documentation

## 2018-11-13 DIAGNOSIS — E876 Hypokalemia: Secondary | ICD-10-CM | POA: Insufficient documentation

## 2018-11-13 DIAGNOSIS — E041 Nontoxic single thyroid nodule: Secondary | ICD-10-CM | POA: Insufficient documentation

## 2018-11-13 LAB — CMP (CANCER CENTER ONLY)
ALT: 7 U/L (ref 0–44)
AST: 11 U/L — ABNORMAL LOW (ref 15–41)
Albumin: 3.6 g/dL (ref 3.5–5.0)
Alkaline Phosphatase: 113 U/L (ref 38–126)
Anion gap: 10 (ref 5–15)
BUN: 17 mg/dL (ref 8–23)
CO2: 32 mmol/L (ref 22–32)
Calcium: 9.3 mg/dL (ref 8.9–10.3)
Chloride: 94 mmol/L — ABNORMAL LOW (ref 98–111)
Creatinine: 1.16 mg/dL (ref 0.61–1.24)
GFR, Est AFR Am: >60 mL/min (ref >60)
GFR, Estimated: >60 mL/min (ref >60)
Glucose, Bld: 102 mg/dL — ABNORMAL HIGH (ref 70–99)
Potassium: 3.2 mmol/L — ABNORMAL LOW (ref 3.5–5.1)
Sodium: 136 mmol/L (ref 135–145)
Total Bilirubin: 0.6 mg/dL (ref 0.3–1.2)
Total Protein: 8 g/dL (ref 6.5–8.1)

## 2018-11-13 LAB — CBC WITH DIFFERENTIAL (CANCER CENTER ONLY)
ABS IMMATURE GRANULOCYTES: 0.02 10*3/uL (ref 0.00–0.07)
Basophils Absolute: 0 10*3/uL (ref 0.0–0.1)
Basophils Relative: 1 %
EOS PCT: 3 %
Eosinophils Absolute: 0.2 10*3/uL (ref 0.0–0.5)
HCT: 38.7 % — ABNORMAL LOW (ref 39.0–52.0)
Hemoglobin: 12.3 g/dL — ABNORMAL LOW (ref 13.0–17.0)
Immature Granulocytes: 0 %
Lymphocytes Relative: 18 %
Lymphs Abs: 1.2 10*3/uL (ref 0.7–4.0)
MCH: 25.9 pg — ABNORMAL LOW (ref 26.0–34.0)
MCHC: 31.8 g/dL (ref 30.0–36.0)
MCV: 81.6 fL (ref 80.0–100.0)
MONO ABS: 0.7 10*3/uL (ref 0.1–1.0)
Monocytes Relative: 10 %
Neutro Abs: 4.6 10*3/uL (ref 1.7–7.7)
Neutrophils Relative %: 68 %
Platelet Count: 241 10*3/uL (ref 150–400)
RBC: 4.74 MIL/uL (ref 4.22–5.81)
RDW: 14.9 % (ref 11.5–15.5)
WBC Count: 6.7 10*3/uL (ref 4.0–10.5)
nRBC: 0 % (ref 0.0–0.2)

## 2018-11-13 MED ORDER — SODIUM CHLORIDE 0.9% FLUSH
10.0000 mL | Freq: Once | INTRAVENOUS | Status: AC
  Administered 2018-11-13: 10 mL
  Filled 2018-11-13: qty 10

## 2018-11-13 MED ORDER — HEPARIN SOD (PORK) LOCK FLUSH 100 UNIT/ML IV SOLN
500.0000 [IU] | Freq: Once | INTRAVENOUS | Status: AC
  Administered 2018-11-13: 500 [IU]
  Filled 2018-11-13: qty 5

## 2018-11-13 NOTE — Telephone Encounter (Signed)
Printed avs and calender of upcoming appointment. Per 1/15 los

## 2018-11-13 NOTE — Progress Notes (Signed)
[facility] [phone]   [phone]  OFFICE PROGRESS NOTE  [name], MD [address] [place] [address]  DIAGNOSIS: Stage IIIA (T2a, N2, M0) non-small cell lung cancer, adenocarcinoma presented with left lower lobe lung mass in addition to mediastinal lymphadenopathy diagnosed in June 2018. The patient also has suspicious soft tissue density in the pancreatic head but this has been present for 3 years on previous CT scan and unlikely to be related to his current diagnosis of the lung cancer.  PRIOR THERAPY:  1) Concurrent chemoradiation with weekly carboplatin for AUC of 2 and paclitaxel 45 MG/M2. Status post 7 cycles, last dose was given 07/03/2017. 2) Consolidation treatment with immunotherapy with Imfinzi (Durvalumab) 10 MG/KG every 2 weeks, first dose 08/15/2017.  Status post 26 cycles.  CURRENT THERAPY: Observation.  INTERVAL HISTORY: James Zamora 63 y.o. male returns to the clinic today for follow-up visit accompanied by his sister.  The patient is feeling fine today with no concerning complaints except for the shortness of breath and he is currently on home oxygen.  He is currently undergoing diuresis because of his congestive heart failure.  His potassium is low but he is currently on potassium supplements.  He denied having any chest pain, cough or hemoptysis.  He denied having any recent weight loss or night sweats.  He has no nausea, vomiting, diarrhea or constipation.  He is currently on observation.  He had repeat CT scan of the chest performed recently and is here for evaluation and discussion of his scan results.   MEDICAL HISTORY: Past Medical History:  Diagnosis Date  . Adenocarcinoma of left lung, stage 3 ([place]) 05/10/2017  . Aortic atherosclerosis ([place]) 07/31/2017  . Asthma   . Chronic diastolic CHF (congestive heart failure) ([place])    a. 07/2017: echo showing EF of 65-70% with Grade 1 DD  . Chronic fatigue  08/02/2017  . Lung mass   . Non-small cell lung cancer ([place]) 11/13/2015  . Obesity   . Perforated bowel ([place])     ALLERGIES:  is allergic to bee venom and penicillins.  MEDICATIONS:  Current Outpatient Medications  Medication Sig Dispense Refill  . aspirin EC 81 MG EC tablet Take 1 tablet (81 mg total) by mouth daily. 30 tablet 1  . fluticasone furoate-vilanterol (BREO ELLIPTA) 200-25 MCG/INH AEPB Inhale 1 puff into the lungs daily. 1 each 5  . furosemide (LASIX) 80 MG tablet Take 1 tablet (80 mg total) by mouth daily. 90 tablet 3  . lidocaine-prilocaine (EMLA) cream Apply 1 application topically as needed. Apply small amount to port site 1-1.5 hours before treatment. Cover with plastic wrap. 30 g 0  . metolazone (ZAROXOLYN) 2.5 MG tablet Take 1 tablet (2.5 mg total) by mouth every other day. Take 30 minutes prior to taking Lasix 45 tablet 3  . ondansetron (ZOFRAN) 8 MG tablet Take 1 tablet (8 mg total) by mouth every 8 (eight) hours as needed for nausea or vomiting. 30 tablet 0  . OXYGEN Inhale 4 L into the lungs continuous.     . potassium chloride SA (K-DUR,KLOR-CON) 20 MEQ tablet Take 1 tablet (20 mEq total) by mouth every other day. Take on the days you take Metolazone 45 tablet 3  . rivaroxaban (XARELTO) 20 MG TABS tablet Take 1 tablet (20 mg total) by mouth daily with supper. Begin 20 mg daily after starter pack is complete. 30 tablet 1   No current  facility-administered medications for this visit.     SURGICAL HISTORY:  Past Surgical History:  Procedure Laterality Date  . CHOLECYSTECTOMY    . COLONOSCOPY N/A 07/31/2016   Procedure: COLONOSCOPY;  Surgeon: Danie Binder, MD;  [facility];  Service: Endoscopy;  Laterality: N/A;  1:45 pm  . ESOPHAGOGASTRODUODENOSCOPY N/A 07/31/2016   Procedure: ESOPHAGOGASTRODUODENOSCOPY (EGD);  Surgeon: Danie Binder, MD;  Location[facility];  Service: Endoscopy;  Laterality: N/A;  . EUS N/A 06/07/2017   Procedure: UPPER ENDOSCOPIC  ULTRASOUND (EUS) LINEAR;  Surgeon: Milus Banister, MD;  Location: [facility];  Service: Endoscopy;  Laterality: N/A;  . HERNIA REPAIR    . IR FLUORO GUIDE PORT INSERTION RIGHT  07/11/2017  . IR US GUIDE VASC ACCESS RIGHT  07/11/2017    REVIEW OF SYSTEMS:  A comprehensive review of systems was negative except for: Respiratory: positive for dyspnea on exertion   PHYSICAL EXAMINATION: General appearance: alert, cooperative and no distress Head: Normocephalic, without obvious abnormality, atraumatic Neck: no adenopathy, no JVD, supple, symmetrical, trachea midline and thyroid not enlarged, symmetric, no tenderness/mass/nodules Lymph nodes: Cervical, supraclavicular, and axillary nodes normal. Resp: clear to auscultation bilaterally Back: symmetric, no curvature. ROM normal. No CVA tenderness. Cardio: regular rate and rhythm, S1, S2 normal, no murmur, click, rub or gallop GI: soft, non-tender; bowel sounds normal; no masses,  no organomegaly Extremities: edema 1+  ECOG PERFORMANCE STATUS: 1 - Symptomatic but completely ambulatory  Blood pressure 133/82, pulse 100, temperature 97.6 F (36.4 C), temperature source Oral, resp. rate 18, height 5\' 6"  (1.676 m), weight 206 lb 3.2 oz (93.5 kg), SpO2 95 %.  LABORATORY DATA: Lab Results  Component Value Date   WBC 6.7 11/13/2018   HGB 12.3 (L) 11/13/2018   HCT 38.7 (L) 11/13/2018   MCV 81.6 11/13/2018   PLT 241 11/13/2018      Chemistry      Component Value Date/Time   NA 136 11/13/2018 0940   NA 139 20-Jan-202018 1313   K 3.2 (L) 11/13/2018 0940   K 3.8 20-Jan-202018 1313   CL 94 (L) 11/13/2018 0940   CO2 32 11/13/2018 0940   CO2 27 20-Jan-202018 1313   BUN 17 11/13/2018 0940   BUN 9.8 20-Jan-202018 1313   CREATININE 1.16 11/13/2018 0940   CREATININE 0.9 20-Jan-202018 1313      Component Value Date/Time   CALCIUM 9.3 11/13/2018 0940   CALCIUM 8.6 20-Jan-202018 1313   ALKPHOS 113 11/13/2018 0940   ALKPHOS 57 20-Jan-202018 1313   AST 11 (L)  11/13/2018 0940   AST 9 20-Jan-202018 1313   ALT 7 11/13/2018 0940   ALT 6 20-Jan-202018 1313   BILITOT 0.6 11/13/2018 0940   BILITOT 0.23 20-Jan-202018 1313       RADIOGRAPHIC STUDIES: Ct Chest Wo Contrast  Result Date: 11/11/2018 CLINICAL DATA:  Lung cancer, immunotherapy completed. EXAM: CT CHEST WITHOUT CONTRAST TECHNIQUE: Multidetector CT imaging of the chest was performed following the standard protocol without IV contrast. COMPARISON:  08/21/2018. FINDINGS: Cardiovascular: Right IJ Port-A-Cath terminates in the right atrium. Atherosclerotic calcification of the aorta. Pulmonary arteries are enlarged. Heart size normal. No pericardial effusion. Mediastinum/Nodes: Lesion in the left thyroid, similar 1.3 cm low-attenuation. Mediastinal and axillary lymph nodes are not enlarged by CT size criteria. Hilar regions are difficult to evaluate without IV contrast. Esophagus is grossly unremarkable. Lungs/Pleura: Centrilobular emphysema. Post treatment collapse/consolidation in the left perihilar region, with associated bronchiectasis, stable. 4 mm peripheral right upper lobe nodule (  series 4, image 43), stable. No pleural fluid. Debris is seen dependently in the left mainstem bronchus. Upper Abdomen: Visualized portions of the liver and right adrenal gland are unremarkable. 1.8 cm low-attenuation nodule in the left adrenal gland is fluid in density. Visualized portions of the kidneys, spleen, pancreas, stomach and bowel are grossly unremarkable. Cholecystectomy. No upper abdominal adenopathy. Musculoskeletal: No worrisome lytic or sclerotic lesions. IMPRESSION: 1. Posttreatment changes in the left hemithorax without evidence of recurrent or metastatic disease. 2. Low-attenuation left thyroid nodule. Consider further evaluation with thyroid ultrasound. If patient is clinically hyperthyroid, consider nuclear medicine thyroid uptake and scan. 3. Left adrenal adenoma. 4.  Aortic atherosclerosis (ICD10-170.0). 5.  Enlarged pulmonary arteries, indicative of pulmonary arterial hypertension. 6.  Emphysema (ICD10-J43.9). Electronically Signed   By: Lorin Picket M.D.   On: 11/11/2018 13:22    ASSESSMENT AND PLAN:  This is a very pleasant 63 years old African-American male recently diagnosed with a stage IIIA non-small cell lung cancer, adenocarcinoma presented with left lower lobe lung mass in addition to mediastinal lymphadenopathy.  The patient underwent a course of concurrent chemoradiation with weekly carboplatin and paclitaxel status post 7 cycles and tolerated his treatment fairly well. He also completed a course of consolidation immunotherapy with Imfinzi (Durvalumab) status post 26 cycles.  He tolerated this treatment well. The patient is currently on observation and he is feeling fine except for the baseline shortness of breath secondary to COPD. He had repeat CT scan of the chest performed recently.  I personally and independently reviewed the scan and discussed the results with the patient and his sister.  His scan showed no concerning findings for disease recurrence or progression.  There was a left thyroid nodule that we will monitor closely on the upcoming imaging studies.  This nodule was not hypermetabolic on the previous PET scan in 2018. I will also arrange for the patient to have Port-A-Cath flush every 6 weeks. For the hypokalemia he was advised to increase his potassium intake with the diuresis. The patient was advised to call immediately if he has any concerning symptoms in the interval. The patient voices understanding of current disease status and treatment options and is in agreement with the current care plan. All questions were answered. The patient knows to call the clinic with any problems, questions or concerns. We can certainly see the patient much sooner if necessary.  Disclaimer: This note was dictated with voice recognition software. Similar sounding words can inadvertently be  transcribed and may not be corrected upon review.

## 2018-11-24 ENCOUNTER — Other Ambulatory Visit: Payer: Self-pay | Admitting: Oncology

## 2018-11-24 DIAGNOSIS — I2692 Saddle embolus of pulmonary artery without acute cor pulmonale: Secondary | ICD-10-CM

## 2018-11-28 ENCOUNTER — Ambulatory Visit (INDEPENDENT_AMBULATORY_CARE_PROVIDER_SITE_OTHER): Payer: Medicare Other | Admitting: Student

## 2018-11-28 ENCOUNTER — Encounter: Payer: Self-pay | Admitting: Student

## 2018-11-28 VITALS — BP 114/76 | HR 90 | Ht 66.0 in | Wt 192.0 lb

## 2018-11-28 DIAGNOSIS — I10 Essential (primary) hypertension: Secondary | ICD-10-CM | POA: Diagnosis not present

## 2018-11-28 DIAGNOSIS — C3490 Malignant neoplasm of unspecified part of unspecified bronchus or lung: Secondary | ICD-10-CM | POA: Diagnosis not present

## 2018-11-28 DIAGNOSIS — Z86711 Personal history of pulmonary embolism: Secondary | ICD-10-CM

## 2018-11-28 DIAGNOSIS — J449 Chronic obstructive pulmonary disease, unspecified: Secondary | ICD-10-CM

## 2018-11-28 DIAGNOSIS — I5032 Chronic diastolic (congestive) heart failure: Secondary | ICD-10-CM

## 2018-11-28 NOTE — Patient Instructions (Signed)
Medication Instructions:  Your physician recommends that you continue on your current medications as directed. Please refer to the Current Medication list given to you today.  If you need a refill on your cardiac medications before your next appointment, please call your pharmacy.   Lab work: NONE  If you have labs (blood work) drawn today and your tests are completely normal, you will receive your results only by: Marland Kitchen MyChart Message (if you have MyChart) OR . A paper copy in the mail If you have any lab test that is abnormal or we need to change your treatment, we will call you to review the results.  Testing/Procedures: NONE   Follow-Up: At [facility], you and your health needs are our priority.  As part of our continuing mission to provide you with exceptional heart care, we have created designated Provider Care Teams.  These Care Teams include your primary Cardiologist (physician) and Advanced Practice Providers (APPs -  Physician Assistants and Nurse Practitioners) who all work together to provide you with the care you need, when you need it. You will need a follow up appointment in 2 months.  Please call our office 2 months in advance to schedule this appointment.  You may see [name], MD or one of the following Advanced Practice Providers on your designated Care Team:   Bernerd Pho, PA-C [facility]) . Ermalinda Barrios, PA-C ([place])  Any Other Special Instructions Will Be Listed Below (If Applicable). Thank you for choosing [facility]!

## 2018-11-28 NOTE — Progress Notes (Signed)
Cardiology Office Note    Date:  11/28/2018   ID:  James, Zamora [birth year], MRN [id number]  PCP:  [name], MD  Cardiologist: [name], MD    Chief Complaint  Patient presents with  . Follow-up    4 week visit    History of Present Illness:    James Zamora is a 63 y.o. male with past medical history of chronic diastolic CHF, HTN, COPD(on 4L [place] at baseline),history of PE (on Xarelto)and NSCLC who presents to the office today for 4-week follow-up.   He was last examined by Dr. Harrington Challenger on 10/28/2018 and reported that his breathing had been at baseline and he had been experiencing intermittent lower extremity edema. Family continued to address concerns that he was not compliant with his outpatient regimen. Ambulatory oxygen saturations were checked in clinic and decreased into the 70's with ambulation. Compliance with his current regimen of Lasix 80 mg daily was strongly recommended. Labs were obtained and BNP was normal at 25 and creatinine had improved to 1.49 (previously elevated to 1.95 in 07/2018).  K+ was slightly elevated to 5.2 and it was recommended that he take K-Dur 20 mEq every other day along with taking Metolazone 2.5mg  every other day to help with fluid accumulation. Repeat BMET on 1/15 showed K+ at 3.2 and creatinine 1.16.  Repeat echocardiogram was also obtained and showed a preserved EF of 65 to 70% with mild LVH and mildly reduced function of the right ventricle.  In talking with the patient today, he reports that weight has trended down to approximately 190 lbs on his home scales. Weight was previously 205 lbs at the time of his office visit in 09/2018, improved to 192 lbs today. He reports being compliant with his current medications including Lasix 80 mg daily and K-dur 20 mEq every other day. He has reduced his Metolazone use to 1-2 times per week and takes this if he feels like he is not urinating enough with Lasix.  He says that breathing has been  at baseline and denies any acute changes in this. Remains on 4 L nasal cannula. Denies any specific chest pain, palpitations, orthopnea, or PND.    Was previously suffering from diarrhea over the weekend but says symptoms have now resolved.  He denies any associated melena or hematochezia.   Past Medical History:  Diagnosis Date  . Adenocarcinoma of left lung, stage 3 ([place]) 05/10/2017  . Aortic atherosclerosis ([place]) 07/31/2017  . Asthma   . Chronic diastolic CHF (congestive heart failure) ([place])    a. 07/2017: echo showing EF of 65-70% with Grade 1 DD  . Chronic fatigue 08/02/2017  . Lung mass   . Non-small cell lung cancer ([place]) 11/13/2015  . Obesity   . Perforated bowel [facility])     Past Surgical History:  Procedure Laterality Date  . CHOLECYSTECTOMY    . COLONOSCOPY N/A 07/31/2016   Procedure: COLONOSCOPY;  Surgeon: Danie Binder, MD;  [facility];  Service: Endoscopy;  Laterality: N/A;  1:45 pm  . ESOPHAGOGASTRODUODENOSCOPY N/A 07/31/2016   Procedure: ESOPHAGOGASTRODUODENOSCOPY (EGD);  Surgeon: Danie Binder, MD;  [facility];  Service: Endoscopy;  Laterality: N/A;  . EUS N/A 06/07/2017   Procedure: UPPER ENDOSCOPIC ULTRASOUND (EUS) LINEAR;  Surgeon: Milus Banister, MD;  [facility];  Service: Endoscopy;  Laterality: N/A;  . HERNIA REPAIR    . IR FLUORO GUIDE PORT INSERTION RIGHT  07/11/2017  . IR US GUIDE  VASC ACCESS RIGHT  07/11/2017    Current Medications: Outpatient Medications Prior to Visit  Medication Sig Dispense Refill  . aspirin EC 81 MG EC tablet Take 1 tablet (81 mg total) by mouth daily. 30 tablet 1  . fluticasone furoate-vilanterol (BREO ELLIPTA) 200-25 MCG/INH AEPB Inhale 1 puff into the lungs daily. 1 each 5  . furosemide (LASIX) 80 MG tablet Take 1 tablet (80 mg total) by mouth daily. 90 tablet 3  . lidocaine-prilocaine (EMLA) cream Apply 1 application topically as needed. Apply small amount to port site 1-1.5 hours before  treatment. Cover with plastic wrap. 30 g 0  . metolazone (ZAROXOLYN) 2.5 MG tablet Take 1 tablet (2.5 mg total) by mouth every other day. Take 30 minutes prior to taking Lasix 45 tablet 3  . ondansetron (ZOFRAN) 8 MG tablet Take 1 tablet (8 mg total) by mouth every 8 (eight) hours as needed for nausea or vomiting. 30 tablet 0  . OXYGEN Inhale 4 L into the lungs continuous.     . potassium chloride SA (K-DUR,KLOR-CON) 20 MEQ tablet Take 1 tablet (20 mEq total) by mouth every other day. Take on the days you take Metolazone 45 tablet 3  . XARELTO 20 MG TABS tablet TAKE 1 TABLET BY MOUTH ONCE DAILY WITH SUPPER - BEGIN 20 MG DAILY AFTER STARTER PACK IS COMPLETE 30 tablet 0   No facility-administered medications prior to visit.      Allergies:   Bee venom and Penicillins   Social History   Socioeconomic History  . Marital status: Single    Spouse name: Not on file  . Number of children: Not on file  . Years of education: Not on file  . Highest education level: Not on file  Occupational History  . Not on file  Social Needs  . Financial resource strain: Not on file  . Food insecurity:    Worry: Not on file    Inability: Not on file  . Transportation needs:    Medical: Not on file    Non-medical: Not on file  Tobacco Use  . Smoking status: Former Smoker    Packs/day: 2.00    Years: 10.00    Pack years: 20.00    Types: Cigarettes    Last attempt to quit: 07/06/2012    Years since quitting: 6.4  . Smokeless tobacco: Former Systems developer    Types: [place] date: 07/06/2014  Substance and Sexual Activity  . Alcohol use: No  . Drug use: No  . Sexual activity: Never  Lifestyle  . Physical activity:    Days per week: Not on file    Minutes per session: Not on file  . Stress: Not on file  Relationships  . Social connections:    Talks on phone: Not on file    Gets together: Not on file    Attends religious service: Not on file    Active member of club or organization: Not on file     Attends meetings of clubs or organizations: Not on file    Relationship status: Not on file  Other Topics Concern  . Not on file  Social History Narrative  . Not on file     Family History:  The patient's family history includes Asthma in his father; Diabetes in his mother; Hypertension in his father and mother; Prostate cancer in his father.   Review of Systems:   Please see the history of present illness.     General:  No chills, fever, night sweats or weight changes.  Cardiovascular:  No chest pain, dyspnea on exertion, orthopnea, palpitations, paroxysmal nocturnal dyspnea. Positive for lower extremity edema.  Dermatological: No rash, lesions/masses Respiratory: No cough, dyspnea Urologic: No hematuria, dysuria Abdominal:   No nausea, vomiting, bright red blood per rectum, melena, or hematemesis. Positive for diarrhea (now resolved).  Neurologic:  No visual changes, wkns, changes in mental status. All other systems reviewed and are otherwise negative except as noted above.   Physical Exam:    VS:  BP 114/76   Pulse 90   Ht 5\' 6"  (1.676 m)   Wt 192 lb (87.1 kg)   SpO2 90%   BMI 30.99 kg/m    General: Well developed, African American male appearing in no acute distress. Head: Normocephalic, atraumatic, sclera non-icteric, no xanthomas, nares are without discharge.  Neck: No carotid bruits. JVD not elevated.  Lungs: Respirations regular and unlabored, without wheezes or rales.  Heart: Regular rate and rhythm. No S3 or S4.  No murmur, no rubs, or gallops appreciated. Abdomen: Soft, non-tender, non-distended with normoactive bowel sounds. No hepatomegaly. No rebound/guarding. No obvious abdominal masses. Msk:  Strength and tone appear normal for age. No joint deformities or effusions. Extremities: No clubbing or cyanosis. Chronic edema but improved from prior examination, left > right.  Distal pedal pulses are 2+ bilaterally. Neuro: Alert and oriented X 3. Moves all extremities  spontaneously. No focal deficits noted. Psych:  Responds to questions appropriately with a normal affect. Skin: No rashes or lesions noted  Wt Readings from Last 3 Encounters:  11/28/18 192 lb (87.1 kg)  11/13/18 206 lb 3.2 oz (93.5 kg)  10/28/18 205 lb (93 kg)     Studies/Labs Reviewed:   EKG:  EKG is not ordered today.   Recent Labs: 10/28/2018: B Natriuretic Peptide 25.0; TSH 1.163 11/13/2018: ALT 7; BUN 17; Creatinine 1.16; Hemoglobin 12.3; Platelet Count 241; Potassium 3.2; Sodium 136   Lipid Panel No results found for: CHOL, TRIG, HDL, CHOLHDL, VLDL, LDLCALC, LDLDIRECT  Additional studies/ records that were reviewed today include:   Echocardiogram: 11/01/2018 Study Conclusions  - Left ventricle: The cavity size was normal. Wall thickness was   increased in a pattern of mild LVH. Systolic function was   vigorous. The estimated ejection fraction was in the range of 65%   to 70%. - Right ventricle: Systolic function was mildly reduced.  Assessment:    1. Chronic diastolic heart failure ([place])   2. Essential hypertension   3. Chronic obstructive pulmonary disease, unspecified COPD type ([place])   4. History of pulmonary embolus (PE)   5. Non-small cell lung cancer, unspecified laterality ([place])      Plan:   In order of problems listed above:  1. Chronic Diastolic CHF - Fluid status was previously difficult to manage in the setting of noncompliance with his medications. In talking with his sister, she reports that he is now living by himself and is starting to gain more independence and taking responsibility for his medical issues. His respiratory status has significantly improved and volume status is the best it has been in several months by examination.  Weight has declined by 13 lbs since his last office visit and recent labs on 11/13/2018 showed creatinine was stable at 1.16. He was mildly hypokalemic at 3.2 and says he adjusted his K-dur dosing at the discretion of  Oncology. Will continue on Lasix 80 mg daily along with Metolazone 2.5 mg at least once weekly with additional  dosing as needed for weight gain or worsening edema. Sodium and fluid restriction again reviewed.   2. HTN - BP is well controlled at 114/76 during today's visit. He is not currently on any specific anti-hypertensives. Does remain on Lasix 80 mg daily.  3. COPD - no active wheezing appreciated on examination. On 4L [place] at baseline.   4. History of PE - He has remained on Xarelto 20 mg daily for anticoagulation. Denies any recent melena, hematochezia, or hematuria. Hemoglobin stable at 12.3 when checked on 11/13/2018.  5. NSCLC - currently in the observation phase. Followed by Dr. Julien Nordmann.    Medication Adjustments/Labs and Tests Ordered: Current medicines are reviewed at length with the patient today.  Concerns regarding medicines are outlined above.  Medication changes, Labs and Tests ordered today are listed in the Patient Instructions below. Patient Instructions  Medication Instructions:  Your physician recommends that you continue on your current medications as directed. Please refer to the Current Medication list given to you today.  If you need a refill on your cardiac medications before your next appointment, please call your pharmacy.   Lab work: NONE  If you have labs (blood work) drawn today and your tests are completely normal, you will receive your results only by: Marland Kitchen MyChart Message (if you have MyChart) OR . A paper copy in the mail If you have any lab test that is abnormal or we need to change your treatment, we will call you to review the results.  Testing/Procedures: NONE   Follow-Up: At [facility], you and your health needs are our priority.  As part of our continuing mission to provide you with exceptional heart care, we have created designated Provider Care Teams.  These Care Teams include your primary Cardiologist (physician) and Advanced Practice  Providers (APPs -  Physician Assistants and Nurse Practitioners) who all work together to provide you with the care you need, when you need it. You will need a follow up appointment in 2 months.  Please call our office 2 months in advance to schedule this appointment.  You may see [name], MD or one of the following Advanced Practice Providers on your designated Care Team:   Bernerd Pho, PA-C [facility]) . Ermalinda Barrios, PA-C ([place])  Any Other Special Instructions Will Be Listed Below (If Applicable). Thank you for choosing [facility]!    Signed, Erma Heritage, PA-C  11/28/2018 4:54 PM    [facility] [address] [place], [place] [address] Phone: [phone] Fax: [phone]

## 2018-12-25 ENCOUNTER — Inpatient Hospital Stay: Payer: Medicare Other | Attending: Internal Medicine

## 2018-12-25 DIAGNOSIS — C3432 Malignant neoplasm of lower lobe, left bronchus or lung: Secondary | ICD-10-CM | POA: Diagnosis not present

## 2018-12-25 DIAGNOSIS — C771 Secondary and unspecified malignant neoplasm of intrathoracic lymph nodes: Secondary | ICD-10-CM | POA: Insufficient documentation

## 2018-12-25 DIAGNOSIS — Z452 Encounter for adjustment and management of vascular access device: Secondary | ICD-10-CM | POA: Diagnosis not present

## 2018-12-25 MED ORDER — SODIUM CHLORIDE 0.9% FLUSH
10.0000 mL | Freq: Once | INTRAVENOUS | Status: AC
  Administered 2018-12-25: 10 mL
  Filled 2018-12-25: qty 10

## 2018-12-25 MED ORDER — HEPARIN SOD (PORK) LOCK FLUSH 100 UNIT/ML IV SOLN
500.0000 [IU] | Freq: Once | INTRAVENOUS | Status: AC
  Administered 2018-12-25: 500 [IU]
  Filled 2018-12-25: qty 5

## 2019-01-29 ENCOUNTER — Encounter: Payer: Self-pay | Admitting: *Deleted

## 2019-01-31 ENCOUNTER — Other Ambulatory Visit: Payer: Self-pay | Admitting: Oncology

## 2019-01-31 ENCOUNTER — Other Ambulatory Visit: Payer: Self-pay | Admitting: Physician Assistant

## 2019-01-31 ENCOUNTER — Telehealth (INDEPENDENT_AMBULATORY_CARE_PROVIDER_SITE_OTHER): Payer: Medicare Other | Admitting: Internal Medicine

## 2019-01-31 DIAGNOSIS — I700 Atherosclerosis of aorta: Secondary | ICD-10-CM | POA: Diagnosis not present

## 2019-01-31 DIAGNOSIS — I2782 Chronic pulmonary embolism: Secondary | ICD-10-CM | POA: Diagnosis not present

## 2019-01-31 DIAGNOSIS — I5032 Chronic diastolic (congestive) heart failure: Secondary | ICD-10-CM | POA: Diagnosis not present

## 2019-01-31 DIAGNOSIS — J449 Chronic obstructive pulmonary disease, unspecified: Secondary | ICD-10-CM | POA: Diagnosis not present

## 2019-01-31 DIAGNOSIS — I2692 Saddle embolus of pulmonary artery without acute cor pulmonale: Secondary | ICD-10-CM

## 2019-01-31 MED ORDER — RIVAROXABAN 20 MG PO TABS: ORAL_TABLET | ORAL | 3 refills | Status: DC

## 2019-01-31 NOTE — Progress Notes (Signed)
Medication Instructions:  Your physician recommends that you continue on your current medications as directed. Please refer to the Current Medication list given to you today.   Labwork: none  Testing/Procedures: none Follow-Up: Your physician recommends that you schedule a follow-up appointment in: July 2020   Any Other Special Instructions Will Be Listed Below (If Applicable).     If you need a refill on your cardiac medications before your next appointment, please call your pharmacy.

## 2019-01-31 NOTE — Progress Notes (Signed)
Per Dr. Julien Nordmann, patient is to continue life long anti-coagulation due to his risk factors. Will send a refill to his pharmacy of [place].

## 2019-02-01 NOTE — Progress Notes (Signed)
Telephone visit   Evaluation Performed:  Follow-up visit  This visit type was conducted due to national recommendations for restrictions regarding the COVID-19 Pandemic (e.g. social distancing).  This format is felt to be most appropriate for this patient at this time.  All issues noted in this document were discussed and addressed.  No physical exam was performed (except for noted visual exam findings with Video Visits).  Please refer to the patient's chart (MyChart message for video visits and phone note for telephone visits) for the patient's consent to telehealth for [facility].  Date:  02/01/2019   ID:  James Zamora, DOB 03-29-1956, MRN [id number]  Patient Location:  Home  Provider location:   [place]  PCP:  [name], MD  Cardiologist:  James Carnes, MD   Chief Complaint:  F/U of SOB    History of Present Illness:    James Zamora is a 63 y.o. male who presents via audio/video conferencing for a telehealth visit today.   The patient does not have symptoms concerning for COVID-19 infection (fever, chills, cough, or new shortness of breath).   The pt is a 63 yo with hx of chronic diastolic CHF, HTN, COPD (on chronic 4L O2 [place]), Hx PE( on Xarelto)and NSCLC    I saw him in Dec.   At  That time sats decreased to 70%s with walking.   I recomm adding some metalozone every othrre day   Echo done LVEF 65 to 0%  Mild reduced RV function  The pt was seen by B Strader in Jan 2020  Breathing wsa better   Wt down 13 lb from visit.  Since seen hes breathing is rel unchanged   Does get winded, stable   Ankle edema is improved.    He is due to see Dr Berdine Addison next week   Will need BMET drawn  The pt says his breathing is stable   Denies PND   DOes not do much     Past Medical History:  Diagnosis Date  . Adenocarcinoma of left lung, stage 3 ([place]) 05/10/2017  . Aortic atherosclerosis ([place]) 07/31/2017  . Asthma   . Chronic diastolic CHF (congestive heart failure) ([place])    a.  07/2017: echo showing EF of 65-70% with Grade 1 DD  . Chronic fatigue 08/02/2017  . Lung mass   . Non-small cell lung cancer ([place]) 11/13/2015  . Obesity   . Perforated bowel [facility])    Past Surgical History:  Procedure Laterality Date  . CHOLECYSTECTOMY    . COLONOSCOPY N/A 07/31/2016   Procedure: COLONOSCOPY;  Surgeon: Danie Binder, MD;  [facility];  Service: Endoscopy;  Laterality: N/A;  1:45 pm  . ESOPHAGOGASTRODUODENOSCOPY N/A 07/31/2016   Procedure: ESOPHAGOGASTRODUODENOSCOPY (EGD);  Surgeon: Danie Binder, MD;  Location[facility];  Service: Endoscopy;  Laterality: N/A;  . EUS N/A 06/07/2017   Procedure: UPPER ENDOSCOPIC ULTRASOUND (EUS) LINEAR;  Surgeon: Milus Banister, MD;  Location[facility];  Service: Endoscopy;  Laterality: N/A;  . HERNIA REPAIR    . IR FLUORO GUIDE PORT INSERTION RIGHT  07/11/2017  . IR US GUIDE VASC ACCESS RIGHT  07/11/2017     No outpatient medications have been marked as taking for the 01/31/19 encounter (Telemedicine) with Fay Records, MD.     Allergies:   Bee venom and Penicillins   Social History   Tobacco Use  . Smoking status: Former Smoker    Packs/day: 2.00  Years: 10.00    Pack years: 20.00    Types: Cigarettes    Last attempt to quit: 07/06/2012    Years since quitting: 6.5  . Smokeless tobacco: Former Systems developer    Types: [place] date: 07/06/2014  Substance Use Topics  . Alcohol use: No  . Drug use: No     Family Hx: The patient's family history includes Asthma in his father; Diabetes in his mother; Hypertension in his father and mother; Prostate cancer in his father. There is no history of Colon cancer.  ROS:   Please see the history of present illness.    All other systems reviewed and are negative.   Labs/Other Tests and Data Reviewed:    Recent Labs: 10/28/2018: B Natriuretic Peptide 25.0; TSH 1.163 11/13/2018: ALT 7; BUN 17; Creatinine 1.16; Hemoglobin 12.3; Platelet Count 241; Potassium 3.2; Sodium 136    Recent Lipid Panel No results found for: CHOL, TRIG, HDL, CHOLHDL, LDLCALC, LDLDIRECT  Wt Readings from Last 3 Encounters:  11/28/18 192 lb (87.1 kg)  11/13/18 206 lb 3.2 oz (93.5 kg)  10/28/18 205 lb (93 kg)     Objective:    Vital Signs:   PT reports BP good    He did not take    ASSESSMENT & PLAN:    1.  1  Chronic diastolic CHF   Symptoms sound stable    Would keep on same regimen   Watch salt  2   HTN   I asked pt to follow BP  3  Chronic lung dz.    4  Hx PE   Continue Xarelto   Periodic labs     COVID-19 Education: The signs and symptoms of COVID-19 were discussed with the patient and how to seek care for testing (follow up with PCP or arrange E-visit).  The importance of social distancing was discussed today.  Patient Risk:   After full review of this patient's clinical status, I feel that they are at least moderate risk at this time.  Time:   Today, I have spent 20 minutes with the patient reviewing chart and discussing medical problems including  and reviewing symptoms of COVID 19 and the ways to protect against contracting the virus with telehealth technology.      Medication Adjustments/Labs and Tests Ordered: Current medicines are reviewed at length with the patient today.  Concerns regarding medicines are outlined above.  Tests Ordered: No orders of the defined types were placed in this encounter.  Medication Changes: No orders of the defined types were placed in this encounter.   Disposition:  Follow up July 2020  SignedDorris Carnes, MD  02/01/2019 10:43 PM    [place]

## 2019-02-04 DIAGNOSIS — C3432 Malignant neoplasm of lower lobe, left bronchus or lung: Secondary | ICD-10-CM | POA: Diagnosis not present

## 2019-02-04 DIAGNOSIS — I5032 Chronic diastolic (congestive) heart failure: Secondary | ICD-10-CM | POA: Diagnosis not present

## 2019-02-04 DIAGNOSIS — J449 Chronic obstructive pulmonary disease, unspecified: Secondary | ICD-10-CM | POA: Diagnosis not present

## 2019-02-04 DIAGNOSIS — Z7901 Long term (current) use of anticoagulants: Secondary | ICD-10-CM | POA: Diagnosis not present

## 2019-02-04 DIAGNOSIS — Z9981 Dependence on supplemental oxygen: Secondary | ICD-10-CM | POA: Diagnosis not present

## 2019-02-05 ENCOUNTER — Inpatient Hospital Stay: Payer: Medicare Other

## 2019-02-05 ENCOUNTER — Inpatient Hospital Stay: Payer: Medicare Other | Attending: Internal Medicine

## 2019-02-05 ENCOUNTER — Other Ambulatory Visit: Payer: Self-pay

## 2019-02-05 DIAGNOSIS — C3432 Malignant neoplasm of lower lobe, left bronchus or lung: Secondary | ICD-10-CM

## 2019-02-05 DIAGNOSIS — I5032 Chronic diastolic (congestive) heart failure: Secondary | ICD-10-CM | POA: Diagnosis not present

## 2019-02-05 DIAGNOSIS — Z7901 Long term (current) use of anticoagulants: Secondary | ICD-10-CM | POA: Diagnosis not present

## 2019-02-05 DIAGNOSIS — Z79899 Other long term (current) drug therapy: Secondary | ICD-10-CM | POA: Diagnosis not present

## 2019-02-05 DIAGNOSIS — J439 Emphysema, unspecified: Secondary | ICD-10-CM | POA: Diagnosis not present

## 2019-02-05 DIAGNOSIS — Z7982 Long term (current) use of aspirin: Secondary | ICD-10-CM | POA: Diagnosis not present

## 2019-02-05 DIAGNOSIS — Z880 Allergy status to penicillin: Secondary | ICD-10-CM | POA: Insufficient documentation

## 2019-02-05 DIAGNOSIS — Z5112 Encounter for antineoplastic immunotherapy: Secondary | ICD-10-CM

## 2019-02-05 DIAGNOSIS — C3490 Malignant neoplasm of unspecified part of unspecified bronchus or lung: Secondary | ICD-10-CM

## 2019-02-05 DIAGNOSIS — R5382 Chronic fatigue, unspecified: Secondary | ICD-10-CM

## 2019-02-05 LAB — CMP (CANCER CENTER ONLY)
ALT: 8 U/L (ref 0–44)
AST: 12 U/L — ABNORMAL LOW (ref 15–41)
Albumin: 3.4 g/dL — ABNORMAL LOW (ref 3.5–5.0)
Alkaline Phosphatase: 104 U/L (ref 38–126)
Anion gap: 10 (ref 5–15)
BUN: 12 mg/dL (ref 8–23)
CO2: 29 mmol/L (ref 22–32)
Calcium: 8.9 mg/dL (ref 8.9–10.3)
Chloride: 101 mmol/L (ref 98–111)
Creatinine: 1.28 mg/dL — ABNORMAL HIGH (ref 0.61–1.24)
GFR, Est AFR Am: >60 mL/min (ref >60)
GFR, Estimated: 60 mL/min — ABNORMAL LOW (ref >60)
Glucose, Bld: 100 mg/dL — ABNORMAL HIGH (ref 70–99)
Potassium: 3.9 mmol/L (ref 3.5–5.1)
Sodium: 140 mmol/L (ref 135–145)
Total Bilirubin: 0.4 mg/dL (ref 0.3–1.2)
Total Protein: 7.4 g/dL (ref 6.5–8.1)

## 2019-02-05 LAB — CBC WITH DIFFERENTIAL (CANCER CENTER ONLY)
Abs Immature Granulocytes: 0.01 10*3/uL (ref 0.00–0.07)
Basophils Absolute: 0 10*3/uL (ref 0.0–0.1)
Basophils Relative: 1 %
Eosinophils Absolute: 0.2 10*3/uL (ref 0.0–0.5)
Eosinophils Relative: 3 %
HCT: 38.1 % — ABNORMAL LOW (ref 39.0–52.0)
Hemoglobin: 11.4 g/dL — ABNORMAL LOW (ref 13.0–17.0)
Immature Granulocytes: 0 %
Lymphocytes Relative: 19 %
Lymphs Abs: 1.1 10*3/uL (ref 0.7–4.0)
MCH: 26.1 pg (ref 26.0–34.0)
MCHC: 29.9 g/dL — ABNORMAL LOW (ref 30.0–36.0)
MCV: 87.2 fL (ref 80.0–100.0)
Monocytes Absolute: 0.7 10*3/uL (ref 0.1–1.0)
Monocytes Relative: 11 %
Neutro Abs: 4 10*3/uL (ref 1.7–7.7)
Neutrophils Relative %: 66 %
Platelet Count: 212 10*3/uL (ref 150–400)
RBC: 4.37 MIL/uL (ref 4.22–5.81)
RDW: 15.7 % — ABNORMAL HIGH (ref 11.5–15.5)
WBC Count: 6 10*3/uL (ref 4.0–10.5)
nRBC: 0 % (ref 0.0–0.2)

## 2019-02-05 LAB — TSH: TSH: 0.797 u[IU]/mL (ref 0.320–4.118)

## 2019-02-07 ENCOUNTER — Telehealth: Payer: Self-pay | Admitting: Internal Medicine

## 2019-02-07 NOTE — Telephone Encounter (Signed)
Left voicemail for patient to call back and let us know if he is able to do a video chat or if he would need a phone call.

## 2019-02-10 ENCOUNTER — Telehealth: Payer: Self-pay | Admitting: Internal Medicine

## 2019-02-10 ENCOUNTER — Ambulatory Visit (HOSPITAL_COMMUNITY)
Admission: RE | Admit: 2019-02-10 | Discharge: 2019-02-10 | Disposition: A | Discharge status: Home / Self Care | Payer: Medicare Other | Source: Ambulatory Visit | Attending: Internal Medicine | Admitting: Internal Medicine

## 2019-02-10 ENCOUNTER — Other Ambulatory Visit: Payer: Self-pay

## 2019-02-10 DIAGNOSIS — C3490 Malignant neoplasm of unspecified part of unspecified bronchus or lung: Secondary | ICD-10-CM | POA: Insufficient documentation

## 2019-02-10 DIAGNOSIS — I700 Atherosclerosis of aorta: Secondary | ICD-10-CM | POA: Diagnosis not present

## 2019-02-10 DIAGNOSIS — J439 Emphysema, unspecified: Secondary | ICD-10-CM | POA: Diagnosis not present

## 2019-02-10 NOTE — Telephone Encounter (Signed)
Just spoke with patient's caregiver and she said a phone call is okay.

## 2019-02-12 ENCOUNTER — Inpatient Hospital Stay (HOSPITAL_BASED_OUTPATIENT_CLINIC_OR_DEPARTMENT_OTHER): Payer: Medicare Other | Admitting: Internal Medicine

## 2019-02-12 ENCOUNTER — Encounter: Payer: Self-pay | Admitting: Internal Medicine

## 2019-02-12 DIAGNOSIS — C3490 Malignant neoplasm of unspecified part of unspecified bronchus or lung: Secondary | ICD-10-CM | POA: Diagnosis not present

## 2019-02-12 NOTE — Progress Notes (Signed)
[place] [phone]   [phone]  PROGRESS NOTE FOR TELEMEDICINE VISITS  James Beard, MD [address] [place] [address]  I connected with@ on 02/12/19 at 12:15 PM EDT by telephone visit and verified that I am speaking with the correct person using two identifiers.   I discussed the limitations, risks, security and privacy concerns of performing an evaluation and management service by telemedicine and the availability of in-person appointments. I also discussed with the patient that there may be a patient responsible charge related to this service. The patient expressed understanding and agreed to proceed.  Other persons participating in the visit and their role in the encounter: None  Patient's location: Home Provider's location: [place]  DIAGNOSIS: Stage IIIA (T2a, N2, M0) non-small cell lung cancer, adenocarcinoma presented with left lower lobe lung mass in addition to mediastinal lymphadenopathy diagnosed in June 2018. The patient also has suspicious soft tissue density in the pancreatic head but this has been present for 3 years on previous CT scan and unlikely to be related to his current diagnosis of the lung cancer.  PRIOR THERAPY:  1) Concurrent chemoradiation with weekly carboplatin for AUC of 2 and paclitaxel 45 MG/M2. Status post 7 cycles, last dose was given 07/03/2017. 2) Consolidation treatment with immunotherapy with Imfinzi (Durvalumab) 10 MG/KG every 2 weeks, first dose 08/15/2017.  Status post 26 cycles.  CURRENT THERAPY: Observation.  INTERVAL HISTORY: James Zamora 63 y.o. male has a virtual telephone visit today for evaluation and discussion for the scan results.  The patient is feeling fine today with no concerning complaints except for the baseline shortness of breath and he is currently on home oxygen.  He denied having any chest pain, cough or hemoptysis.  He denied having any recent  weight loss or night sweats.  He has no nausea, vomiting, diarrhea or constipation.  He has no headache or visual changes.  The patient had repeat CT scan of the chest performed recently and we are having the visit for evaluation and discussion of his scan results.  MEDICAL HISTORY: Past Medical History:  Diagnosis Date   Adenocarcinoma of left lung, stage 3 ([place]) 05/10/2017   Aortic atherosclerosis ([place]) 07/31/2017   Asthma    Chronic diastolic CHF (congestive heart failure) ([place])    a. 07/2017: echo showing EF of 65-70% with Grade 1 DD   Chronic fatigue 08/02/2017   Lung mass    Non-small cell lung cancer ([place]) 11/13/2015   Obesity    Perforated bowel ([place])     ALLERGIES:  is allergic to bee venom and penicillins.  MEDICATIONS:  Current Outpatient Medications  Medication Sig Dispense Refill   aspirin EC 81 MG EC tablet Take 1 tablet (81 mg total) by mouth daily. 30 tablet 1   fluticasone furoate-vilanterol (BREO ELLIPTA) 200-25 MCG/INH AEPB Inhale 1 puff into the lungs daily. 1 each 5   furosemide (LASIX) 80 MG tablet Take 1 tablet (80 mg total) by mouth daily. 90 tablet 3   lidocaine-prilocaine (EMLA) cream Apply 1 application topically as needed. Apply small amount to port site 1-1.5 hours before treatment. Cover with plastic wrap. 30 g 0   metolazone (ZAROXOLYN) 2.5 MG tablet Take 1 tablet (2.5 mg total) by mouth every other day. Take 30 minutes prior to taking Lasix 45 tablet 3   ondansetron (ZOFRAN) 8 MG tablet Take 1 tablet (8 mg total) by mouth every 8 (eight) hours as needed for  nausea or vomiting. 30 tablet 0   OXYGEN Inhale 4 L into the lungs continuous.      potassium chloride SA (K-DUR,KLOR-CON) 20 MEQ tablet Take 1 tablet (20 mEq total) by mouth every other day. Take on the days you take Metolazone 45 tablet 3   rivaroxaban (XARELTO) 20 MG TABS tablet TAKE 1 TABLET BY MOUTH ONCE DAILY WITH SUPPER 30 tablet 3   No current facility-administered medications  for this visit.     SURGICAL HISTORY:  Past Surgical History:  Procedure Laterality Date   CHOLECYSTECTOMY     COLONOSCOPY N/A 07/31/2016   Procedure: COLONOSCOPY;  Surgeon: Danie Binder, MD;  [facility];  Service: Endoscopy;  Laterality: N/A;  1:45 pm   ESOPHAGOGASTRODUODENOSCOPY N/A 07/31/2016   Procedure: ESOPHAGOGASTRODUODENOSCOPY (EGD);  Surgeon: Danie Binder, MD;  [facility];  Service: Endoscopy;  Laterality: N/A;   EUS N/A 06/07/2017   Procedure: UPPER ENDOSCOPIC ULTRASOUND (EUS) LINEAR;  Surgeon: Milus Banister, MD;  [facility];  Service: Endoscopy;  Laterality: N/A;   HERNIA REPAIR     IR FLUORO GUIDE PORT INSERTION RIGHT  07/11/2017   IR US GUIDE VASC ACCESS RIGHT  07/11/2017    REVIEW OF SYSTEMS:  A comprehensive review of systems was negative except for: Respiratory: positive for dyspnea on exertion   LABORATORY DATA: Lab Results  Component Value Date   WBC 6.0 02/05/2019   HGB 11.4 (L) 02/05/2019   HCT 38.1 (L) 02/05/2019   MCV 87.2 02/05/2019   PLT 212 02/05/2019      Chemistry      Component Value Date/Time   NA 140 02/05/2019 0913   NA 139 07-27-2017 1313   K 3.9 02/05/2019 0913   K 3.8 07-27-2017 1313   CL 101 02/05/2019 0913   CO2 29 02/05/2019 0913   CO2 27 07-27-2017 1313   BUN 12 02/05/2019 0913   BUN 9.8 07-27-2017 1313   CREATININE 1.28 (H) 02/05/2019 0913   CREATININE 0.9 07-27-2017 1313      Component Value Date/Time   CALCIUM 8.9 02/05/2019 0913   CALCIUM 8.6 07-27-2017 1313   ALKPHOS 104 02/05/2019 0913   ALKPHOS 57 07-27-2017 1313   AST 12 (L) 02/05/2019 0913   AST 9 07-27-2017 1313   ALT 8 02/05/2019 0913   ALT 6 07-27-2017 1313   BILITOT 0.4 02/05/2019 0913   BILITOT 0.23 07-27-2017 1313       RADIOGRAPHIC STUDIES: Ct Chest Wo Contrast  Result Date: 02/10/2019 CLINICAL DATA:  Non-small-cell lung cancer/adenocarcinoma of the left lower lobe. Mediastinal adenopathy. Diagnosed in  June of 2018. Chemotherapy and radiation therapy. CHF. Asthma. EXAM: CT CHEST WITHOUT CONTRAST TECHNIQUE: Multidetector CT imaging of the chest was performed following the standard protocol without IV contrast. COMPARISON:  11/11/2018 FINDINGS: Cardiovascular: Right Port-A-Cath tip at high right atrium. Normal heart size, without pericardial effusion. Aortic atherosclerosis. Pulmonary artery enlargement, outflow tract 4.0 cm. Mediastinum/Nodes: A left thyroid nodule is similar and nonspecific at 1.3 cm. Hilar regions poorly evaluated without intravenous contrast. A right paratracheal node measures 9 mm on image 53/2 versus 8 mm on the prior (when remeasured). Subcarinal node measures 12 mm on image 75/2 versus on the order of 8 mm on the prior exam (when remeasured). 8 mm prevascular node on image 49/2 is unchanged. Lungs/Pleura: Trace left pleural thickening is similar. Moderate bullous type emphysema. Pleural-based right apical pulmonary nodule posteriorly is unchanged at 4 mm on image 38/4. Similar  configuration of left infrahilar and medial left lower lobe consolidation with traction bronchiectasis and architectural distortion due to radiation fibrosis. No findings of locally recurrent disease Upper Abdomen: Cholecystectomy. Normal imaged portions of the liver, spleen, stomach, pancreas,. Left greater than right adrenal thickening with left adrenal nodularity, similar. Mild renal cortical thinning bilaterally. Musculoskeletal: No acute osseous abnormality. Left hemidiaphragm elevation. IMPRESSION: 1. Similar appearance of radiation fibrosis within the left lower lobe. 2. Mild enlargement of middle mediastinal nodes, which are not pathologic by size criteria. Cannot exclude early nodal metastasis. This could either be re-evaluated with CT follow-up at 3-6 months or more entirely characterized with PET. 3. Aortic atherosclerosis (ICD10-I70.0) and emphysema (ICD10-J43.9). 4. Pulmonary artery enlargement suggests  pulmonary arterial hypertension. Electronically Signed   By: Abigail Miyamoto M.D.   On: 02/10/2019 17:01    ASSESSMENT AND PLAN:  This is a very pleasant 63 years old African-American male recently diagnosed with a stage IIIA non-small cell lung cancer, adenocarcinoma presented with left lower lobe lung mass in addition to mediastinal lymphadenopathy.  The patient underwent a course of concurrent chemoradiation with weekly carboplatin and paclitaxel status post 7 cycles and tolerated his treatment fairly well. He also completed a course of consolidation immunotherapy with Imfinzi (Durvalumab) status post 26 cycles.  He tolerated this treatment well. The patient is currently on observation and he is feeling fine except for shortness of breath secondary to COPD. He had repeat CT scan of the chest performed recently.  I personally and independently reviewed the scans and discussed the results with the patient and his daughter who was available for him.  His scan showed no concerning findings for disease progression but there was mild enlargement of medial mediastinal nodes that need to be closely monitored in the upcoming imaging studies. I recommended for the patient to continue on observation with repeat CT scan of the chest in 3 months. He was advised to call immediately if he has any concerning symptoms in the interval. I discussed the assessment and treatment plan with the patient. The patient was provided an opportunity to ask questions and all were answered. The patient agreed with the plan and demonstrated an understanding of the instructions.   The patient was advised to call back or seek an in-person evaluation if the symptoms worsen or if the condition fails to improve as anticipated.  I provided 12 minutes of non face-to-face telephone visit time during this encounter, and > 50% was spent counseling as documented under my assessment & plan.  Eilleen Kempf, MD 02/12/2019 12:23  PM  Disclaimer: This note was dictated with voice recognition software. Similar sounding words can inadvertently be transcribed and may not be corrected upon review.

## 2019-03-07 ENCOUNTER — Other Ambulatory Visit: Payer: Self-pay | Admitting: Student

## 2019-03-19 ENCOUNTER — Inpatient Hospital Stay: Payer: Medicare Other | Attending: Internal Medicine

## 2019-03-19 ENCOUNTER — Other Ambulatory Visit: Payer: Self-pay

## 2019-03-19 DIAGNOSIS — Z9981 Dependence on supplemental oxygen: Secondary | ICD-10-CM | POA: Insufficient documentation

## 2019-03-19 DIAGNOSIS — C771 Secondary and unspecified malignant neoplasm of intrathoracic lymph nodes: Secondary | ICD-10-CM | POA: Insufficient documentation

## 2019-03-19 DIAGNOSIS — I5032 Chronic diastolic (congestive) heart failure: Secondary | ICD-10-CM | POA: Insufficient documentation

## 2019-03-19 DIAGNOSIS — Z452 Encounter for adjustment and management of vascular access device: Secondary | ICD-10-CM | POA: Diagnosis not present

## 2019-03-19 DIAGNOSIS — R5382 Chronic fatigue, unspecified: Secondary | ICD-10-CM | POA: Diagnosis not present

## 2019-03-19 DIAGNOSIS — C3432 Malignant neoplasm of lower lobe, left bronchus or lung: Secondary | ICD-10-CM | POA: Diagnosis not present

## 2019-03-19 DIAGNOSIS — Z79899 Other long term (current) drug therapy: Secondary | ICD-10-CM | POA: Insufficient documentation

## 2019-03-19 DIAGNOSIS — I700 Atherosclerosis of aorta: Secondary | ICD-10-CM | POA: Diagnosis not present

## 2019-03-19 MED ORDER — HEPARIN SOD (PORK) LOCK FLUSH 100 UNIT/ML IV SOLN
250.0000 [IU] | Freq: Once | INTRAVENOUS | Status: AC
  Administered 2019-03-19: 500 [IU]
  Filled 2019-03-19: qty 5

## 2019-03-19 MED ORDER — SODIUM CHLORIDE 0.9% FLUSH
10.0000 mL | Freq: Once | INTRAVENOUS | Status: AC
  Administered 2019-03-19: 10 mL
  Filled 2019-03-19: qty 10

## 2019-03-19 NOTE — Patient Instructions (Signed)

## 2019-04-30 ENCOUNTER — Other Ambulatory Visit: Payer: Self-pay

## 2019-04-30 ENCOUNTER — Other Ambulatory Visit: Payer: Medicare Other

## 2019-04-30 ENCOUNTER — Inpatient Hospital Stay: Payer: Medicare Other | Attending: Internal Medicine

## 2019-04-30 DIAGNOSIS — J439 Emphysema, unspecified: Secondary | ICD-10-CM | POA: Diagnosis not present

## 2019-04-30 DIAGNOSIS — I700 Atherosclerosis of aorta: Secondary | ICD-10-CM | POA: Insufficient documentation

## 2019-04-30 DIAGNOSIS — C3432 Malignant neoplasm of lower lobe, left bronchus or lung: Secondary | ICD-10-CM | POA: Diagnosis not present

## 2019-04-30 DIAGNOSIS — Z9221 Personal history of antineoplastic chemotherapy: Secondary | ICD-10-CM | POA: Diagnosis not present

## 2019-04-30 DIAGNOSIS — I5032 Chronic diastolic (congestive) heart failure: Secondary | ICD-10-CM | POA: Diagnosis not present

## 2019-04-30 DIAGNOSIS — Z7901 Long term (current) use of anticoagulants: Secondary | ICD-10-CM | POA: Diagnosis not present

## 2019-04-30 DIAGNOSIS — Z79899 Other long term (current) drug therapy: Secondary | ICD-10-CM | POA: Diagnosis not present

## 2019-04-30 DIAGNOSIS — Z880 Allergy status to penicillin: Secondary | ICD-10-CM | POA: Insufficient documentation

## 2019-04-30 DIAGNOSIS — Z452 Encounter for adjustment and management of vascular access device: Secondary | ICD-10-CM | POA: Diagnosis not present

## 2019-04-30 DIAGNOSIS — R0609 Other forms of dyspnea: Secondary | ICD-10-CM | POA: Insufficient documentation

## 2019-04-30 DIAGNOSIS — Z923 Personal history of irradiation: Secondary | ICD-10-CM | POA: Diagnosis not present

## 2019-04-30 MED ORDER — SODIUM CHLORIDE 0.9% FLUSH
10.0000 mL | Freq: Once | INTRAVENOUS | Status: AC
  Administered 2019-04-30: 10 mL
  Filled 2019-04-30: qty 10

## 2019-04-30 MED ORDER — HEPARIN SOD (PORK) LOCK FLUSH 100 UNIT/ML IV SOLN
500.0000 [IU] | Freq: Once | INTRAVENOUS | Status: AC
  Administered 2019-04-30: 11:00:00 500 [IU]
  Filled 2019-04-30: qty 5

## 2019-04-30 NOTE — Patient Instructions (Signed)

## 2019-05-01 ENCOUNTER — Ambulatory Visit: Payer: Medicare Other | Admitting: Internal Medicine

## 2019-05-06 DIAGNOSIS — Z7189 Other specified counseling: Secondary | ICD-10-CM | POA: Diagnosis not present

## 2019-05-06 DIAGNOSIS — J449 Chronic obstructive pulmonary disease, unspecified: Secondary | ICD-10-CM | POA: Diagnosis not present

## 2019-05-06 DIAGNOSIS — C3432 Malignant neoplasm of lower lobe, left bronchus or lung: Secondary | ICD-10-CM | POA: Diagnosis not present

## 2019-05-06 DIAGNOSIS — Z9981 Dependence on supplemental oxygen: Secondary | ICD-10-CM | POA: Diagnosis not present

## 2019-05-06 DIAGNOSIS — Z9989 Dependence on other enabling machines and devices: Secondary | ICD-10-CM | POA: Diagnosis not present

## 2019-05-09 ENCOUNTER — Telehealth: Payer: Self-pay | Admitting: Physician Assistant

## 2019-05-09 ENCOUNTER — Telehealth: Payer: Self-pay | Admitting: *Deleted

## 2019-05-09 NOTE — Telephone Encounter (Signed)
Scan is scheduled and pt's sister is aware of of time and date

## 2019-05-09 NOTE — Telephone Encounter (Signed)
Relayed the patient's appointments for next week to his sister. She confirmed understanding of his appointment times. He will receive his CT scan on the July, 14th at [facility] at [facility].

## 2019-05-09 NOTE — Telephone Encounter (Signed)
Received call from Coralee Pesa regarding upcoming appts for [name]. Pt has lab appt on 05/12/19 prior to ordered CT scan and an appt with Dr. Julien Nordmann on 7/15 to review scan results. However there is no scan scheduled. Scan order is in Epic. Central Scheduling contacted re: scan appt. Awaiting call back

## 2019-05-12 ENCOUNTER — Other Ambulatory Visit: Payer: Self-pay

## 2019-05-12 ENCOUNTER — Inpatient Hospital Stay: Payer: Medicare Other

## 2019-05-12 DIAGNOSIS — R0609 Other forms of dyspnea: Secondary | ICD-10-CM | POA: Diagnosis not present

## 2019-05-12 DIAGNOSIS — C3432 Malignant neoplasm of lower lobe, left bronchus or lung: Secondary | ICD-10-CM | POA: Diagnosis not present

## 2019-05-12 DIAGNOSIS — C3490 Malignant neoplasm of unspecified part of unspecified bronchus or lung: Secondary | ICD-10-CM

## 2019-05-12 DIAGNOSIS — J439 Emphysema, unspecified: Secondary | ICD-10-CM | POA: Diagnosis not present

## 2019-05-12 DIAGNOSIS — Z452 Encounter for adjustment and management of vascular access device: Secondary | ICD-10-CM | POA: Diagnosis not present

## 2019-05-12 DIAGNOSIS — I700 Atherosclerosis of aorta: Secondary | ICD-10-CM | POA: Diagnosis not present

## 2019-05-12 DIAGNOSIS — I5032 Chronic diastolic (congestive) heart failure: Secondary | ICD-10-CM | POA: Diagnosis not present

## 2019-05-12 LAB — CMP (CANCER CENTER ONLY)
ALT: 6 U/L (ref 0–44)
AST: 11 U/L — ABNORMAL LOW (ref 15–41)
Albumin: 3.6 g/dL (ref 3.5–5.0)
Alkaline Phosphatase: 100 U/L (ref 38–126)
Anion gap: 6 (ref 5–15)
BUN: 10 mg/dL (ref 8–23)
CO2: 31 mmol/L (ref 22–32)
Calcium: 8.9 mg/dL (ref 8.9–10.3)
Chloride: 100 mmol/L (ref 98–111)
Creatinine: 1.24 mg/dL (ref 0.61–1.24)
GFR, Est AFR Am: >60 mL/min (ref >60)
GFR, Estimated: >60 mL/min (ref >60)
Glucose, Bld: 113 mg/dL — ABNORMAL HIGH (ref 70–99)
Potassium: 4.1 mmol/L (ref 3.5–5.1)
Sodium: 137 mmol/L (ref 135–145)
Total Bilirubin: 0.3 mg/dL (ref 0.3–1.2)
Total Protein: 7.7 g/dL (ref 6.5–8.1)

## 2019-05-12 LAB — CBC WITH DIFFERENTIAL (CANCER CENTER ONLY)
Abs Immature Granulocytes: 0.01 10*3/uL (ref 0.00–0.07)
Basophils Absolute: 0 10*3/uL (ref 0.0–0.1)
Basophils Relative: 1 %
Eosinophils Absolute: 0.2 10*3/uL (ref 0.0–0.5)
Eosinophils Relative: 3 %
HCT: 39.8 % (ref 39.0–52.0)
Hemoglobin: 11.9 g/dL — ABNORMAL LOW (ref 13.0–17.0)
Immature Granulocytes: 0 %
Lymphocytes Relative: 19 %
Lymphs Abs: 1.2 10*3/uL (ref 0.7–4.0)
MCH: 25.1 pg — ABNORMAL LOW (ref 26.0–34.0)
MCHC: 29.9 g/dL — ABNORMAL LOW (ref 30.0–36.0)
MCV: 83.8 fL (ref 80.0–100.0)
Monocytes Absolute: 0.5 10*3/uL (ref 0.1–1.0)
Monocytes Relative: 9 %
Neutro Abs: 4.1 10*3/uL (ref 1.7–7.7)
Neutrophils Relative %: 68 %
Platelet Count: 205 10*3/uL (ref 150–400)
RBC: 4.75 MIL/uL (ref 4.22–5.81)
RDW: 14.2 % (ref 11.5–15.5)
WBC Count: 6 10*3/uL (ref 4.0–10.5)
nRBC: 0 % (ref 0.0–0.2)

## 2019-05-13 ENCOUNTER — Ambulatory Visit (HOSPITAL_COMMUNITY)
Admission: RE | Admit: 2019-05-13 | Discharge: 2019-05-13 | Disposition: A | Discharge status: Home / Self Care | Payer: Medicare Other | Source: Ambulatory Visit | Attending: Internal Medicine | Admitting: Internal Medicine

## 2019-05-13 DIAGNOSIS — C3490 Malignant neoplasm of unspecified part of unspecified bronchus or lung: Secondary | ICD-10-CM | POA: Insufficient documentation

## 2019-05-13 DIAGNOSIS — J439 Emphysema, unspecified: Secondary | ICD-10-CM | POA: Diagnosis not present

## 2019-05-13 DIAGNOSIS — I700 Atherosclerosis of aorta: Secondary | ICD-10-CM | POA: Diagnosis not present

## 2019-05-13 MED ORDER — IOHEXOL 300 MG/ML  SOLN
75.0000 mL | Freq: Once | INTRAMUSCULAR | Status: AC | PRN
  Administered 2019-05-13: 75 mL via INTRAVENOUS

## 2019-05-14 ENCOUNTER — Inpatient Hospital Stay (HOSPITAL_BASED_OUTPATIENT_CLINIC_OR_DEPARTMENT_OTHER): Payer: Medicare Other | Admitting: Internal Medicine

## 2019-05-14 ENCOUNTER — Other Ambulatory Visit: Payer: Self-pay

## 2019-05-14 ENCOUNTER — Telehealth: Payer: Self-pay | Admitting: Internal Medicine

## 2019-05-14 ENCOUNTER — Encounter: Payer: Self-pay | Admitting: Internal Medicine

## 2019-05-14 VITALS — BP 119/67 | HR 94 | Temp 98.9°F | Resp 18 | Ht 66.0 in | Wt 221.1 lb

## 2019-05-14 DIAGNOSIS — R0609 Other forms of dyspnea: Secondary | ICD-10-CM | POA: Diagnosis not present

## 2019-05-14 DIAGNOSIS — C3432 Malignant neoplasm of lower lobe, left bronchus or lung: Secondary | ICD-10-CM

## 2019-05-14 DIAGNOSIS — I5032 Chronic diastolic (congestive) heart failure: Secondary | ICD-10-CM

## 2019-05-14 DIAGNOSIS — J439 Emphysema, unspecified: Secondary | ICD-10-CM | POA: Diagnosis not present

## 2019-05-14 DIAGNOSIS — Z9221 Personal history of antineoplastic chemotherapy: Secondary | ICD-10-CM

## 2019-05-14 DIAGNOSIS — Z79899 Other long term (current) drug therapy: Secondary | ICD-10-CM | POA: Diagnosis not present

## 2019-05-14 DIAGNOSIS — Z923 Personal history of irradiation: Secondary | ICD-10-CM

## 2019-05-14 DIAGNOSIS — I700 Atherosclerosis of aorta: Secondary | ICD-10-CM

## 2019-05-14 DIAGNOSIS — Z7901 Long term (current) use of anticoagulants: Secondary | ICD-10-CM | POA: Diagnosis not present

## 2019-05-14 DIAGNOSIS — Z880 Allergy status to penicillin: Secondary | ICD-10-CM | POA: Diagnosis not present

## 2019-05-14 DIAGNOSIS — C3490 Malignant neoplasm of unspecified part of unspecified bronchus or lung: Secondary | ICD-10-CM

## 2019-05-14 DIAGNOSIS — Z452 Encounter for adjustment and management of vascular access device: Secondary | ICD-10-CM

## 2019-05-14 NOTE — Progress Notes (Signed)
[facility] [phone]   [phone]  OFFICE PROGRESS NOTE  [name], MD [address] [place] [address]  DIAGNOSIS: Stage IIIA (T2a, N2, M0) non-small cell lung cancer, adenocarcinoma presented with left lower lobe lung mass in addition to mediastinal lymphadenopathy diagnosed in June 2018. The patient also has suspicious soft tissue density in the pancreatic head but this has been present for 3 years on previous CT scan and unlikely to be related to his current diagnosis of the lung cancer.  PRIOR THERAPY:  1) Concurrent chemoradiation with weekly carboplatin for AUC of 2 and paclitaxel 45 MG/M2. Status post 7 cycles, last dose was given 07/03/2017. 2) Consolidation treatment with immunotherapy with Imfinzi (Durvalumab) 10 MG/KG every 2 weeks, first dose 08/15/2017.  Status post 26 cycles.  CURRENT THERAPY: Observation.  INTERVAL HISTORY: James Zamora 63 y.o. male returns to the clinic today for follow-up visit.  The patient is feeling fine today with no concerning complaints.  He gained almost 20 pounds in the last few months.  He denied having any chest pain but has a baseline shortness of breath and currently on home oxygen with no cough or hemoptysis.  He denied having any fever or chills.  He has no nausea, vomiting, diarrhea or constipation.  He had repeat CT scan of the chest performed recently and he is here for evaluation and discussion of his scan results.  MEDICAL HISTORY: Past Medical History:  Diagnosis Date  . Adenocarcinoma of left lung, stage 3 ([place]) 05/10/2017  . Aortic atherosclerosis ([place]) 07/31/2017  . Asthma   . Chronic diastolic CHF (congestive heart failure) ([place])    a. 07/2017: echo showing EF of 65-70% with Grade 1 DD  . Chronic fatigue 08/02/2017  . Lung mass   . Non-small cell lung cancer ([place]) 11/13/2015  . Obesity   . Perforated bowel ([place])     ALLERGIES:  is allergic to bee venom and  penicillins.  MEDICATIONS:  Current Outpatient Medications  Medication Sig Dispense Refill  . aspirin EC 81 MG EC tablet Take 1 tablet (81 mg total) by mouth daily. 30 tablet 1  . fluticasone furoate-vilanterol (BREO ELLIPTA) 200-25 MCG/INH AEPB Inhale 1 puff into the lungs daily. 1 each 5  . furosemide (LASIX) 80 MG tablet Take 1 tablet by mouth once daily 90 tablet 3  . lidocaine-prilocaine (EMLA) cream Apply 1 application topically as needed. Apply small amount to port site 1-1.5 hours before treatment. Cover with plastic wrap. 30 g 0  . metolazone (ZAROXOLYN) 2.5 MG tablet Take 1 tablet (2.5 mg total) by mouth every other day. Take 30 minutes prior to taking Lasix 45 tablet 3  . ondansetron (ZOFRAN) 8 MG tablet Take 1 tablet (8 mg total) by mouth every 8 (eight) hours as needed for nausea or vomiting. 30 tablet 0  . OXYGEN Inhale 4 L into the lungs continuous.     . potassium chloride SA (K-DUR,KLOR-CON) 20 MEQ tablet Take 1 tablet (20 mEq total) by mouth every other day. Take on the days you take Metolazone 45 tablet 3  . rivaroxaban (XARELTO) 20 MG TABS tablet TAKE 1 TABLET BY MOUTH ONCE DAILY WITH SUPPER 30 tablet 3   No current facility-administered medications for this visit.     SURGICAL HISTORY:  Past Surgical History:  Procedure Laterality Date  . CHOLECYSTECTOMY    . COLONOSCOPY N/A 07/31/2016   Procedure: COLONOSCOPY;  Surgeon: Danie Binder,  MD;  [facility];  Service: Endoscopy;  Laterality: N/A;  1:45 pm  . ESOPHAGOGASTRODUODENOSCOPY N/A 07/31/2016   Procedure: ESOPHAGOGASTRODUODENOSCOPY (EGD);  Surgeon: Danie Binder, MD;  Location[facility];  Service: Endoscopy;  Laterality: N/A;  . EUS N/A 06/07/2017   Procedure: UPPER ENDOSCOPIC ULTRASOUND (EUS) LINEAR;  Surgeon: Milus Banister, MD;  Location: [facility];  Service: Endoscopy;  Laterality: N/A;  . HERNIA REPAIR    . IR FLUORO GUIDE PORT INSERTION RIGHT  07/11/2017  . IR US GUIDE VASC ACCESS RIGHT   07/11/2017    REVIEW OF SYSTEMS:  A comprehensive review of systems was negative except for: Respiratory: positive for dyspnea on exertion   PHYSICAL EXAMINATION: General appearance: alert, cooperative and no distress Head: Normocephalic, without obvious abnormality, atraumatic Neck: no adenopathy, no JVD, supple, symmetrical, trachea midline and thyroid not enlarged, symmetric, no tenderness/mass/nodules Lymph nodes: Cervical, supraclavicular, and axillary nodes normal. Resp: clear to auscultation bilaterally Back: symmetric, no curvature. ROM normal. No CVA tenderness. Cardio: regular rate and rhythm, S1, S2 normal, no murmur, click, rub or gallop GI: soft, non-tender; bowel sounds normal; no masses,  no organomegaly Extremities: extremities normal, atraumatic, no cyanosis or edema  ECOG PERFORMANCE STATUS: 1 - Symptomatic but completely ambulatory  Blood pressure 119/67, pulse 94, temperature 98.9 F (37.2 C), temperature source Oral, resp. rate 18, height 5\' 6"  (1.676 m), weight 221 lb 1.6 oz (100.3 kg), SpO2 98 %.  LABORATORY DATA: Lab Results  Component Value Date   WBC 6.0 05/12/2019   HGB 11.9 (L) 05/12/2019   HCT 39.8 05/12/2019   MCV 83.8 05/12/2019   PLT 205 05/12/2019      Chemistry      Component Value Date/Time   NA 137 05/12/2019 1118   NA 139 July 15, 202018 1313   K 4.1 05/12/2019 1118   K 3.8 July 15, 202018 1313   CL 100 05/12/2019 1118   CO2 31 05/12/2019 1118   CO2 27 July 15, 202018 1313   BUN 10 05/12/2019 1118   BUN 9.8 July 15, 202018 1313   CREATININE 1.24 05/12/2019 1118   CREATININE 0.9 July 15, 202018 1313      Component Value Date/Time   CALCIUM 8.9 05/12/2019 1118   CALCIUM 8.6 July 15, 202018 1313   ALKPHOS 100 05/12/2019 1118   ALKPHOS 57 July 15, 202018 1313   AST 11 (L) 05/12/2019 1118   AST 9 July 15, 202018 1313   ALT 6 05/12/2019 1118   ALT 6 July 15, 202018 1313   BILITOT 0.3 05/12/2019 1118   BILITOT 0.23 July 15, 202018 1313       RADIOGRAPHIC STUDIES: Ct Chest W  Contrast  Result Date: 05/14/2019 CLINICAL DATA:  Left lower lobe adenocarcinoma diagnosed 2018. Restaging. EXAM: CT CHEST WITH CONTRAST TECHNIQUE: Multidetector CT imaging of the chest was performed during intravenous contrast administration. CONTRAST:  53mL OMNIPAQUE IOHEXOL 300 MG/ML  SOLN COMPARISON:  02/10/2019 FINDINGS: Cardiovascular: The heart size is normal. No substantial pericardial effusion. Atherosclerotic calcification is noted in the wall of the thoracic aorta. Prominence of the main pulmonary arteries raises the question of pulmonary arterial hypertension. Mediastinum/Nodes: No mediastinal lymphadenopathy. 9 mm short axis prevascular lymph node is stable. No new or progressive mediastinal lymphadenopathy. There is no hilar lymphadenopathy. The esophagus has normal imaging features. There is no axillary lymphadenopathy. Lungs/Pleura: Parahilar scarring in the left lung is stable, consistent with radiation therapy. No new or progressive findings in either lung. Centrilobular emphsyema noted. Upper Abdomen: Low attenuation thickening of the left adrenal gland is compatible with adenomatous hyperplasia.  Musculoskeletal: No worrisome lytic or sclerotic osseous abnormality. IMPRESSION: 1. Stable exam.  No new or progressive interval findings. 2. Parahilar scarring in the left lung compatible with radiation fibrosis. 3. Stable upper normal mediastinal lymph nodes. Continued attention on follow-up recommended. 4.  Aortic Atherosclerois (ICD10-170.0) 5.  Emphysema. (ICD10-J43.9) 6. Prominence of the main pulmonary arteries suggests pulmonary arterial hypertension. Electronically Signed   By: Misty Stanley M.D.   On: 05/14/2019 09:11    ASSESSMENT AND PLAN:  This is a very pleasant 63 years old African-American male recently diagnosed with a stage IIIA non-small cell lung cancer, adenocarcinoma presented with left lower lobe lung mass in addition to mediastinal lymphadenopathy.  The patient underwent a  course of concurrent chemoradiation with weekly carboplatin and paclitaxel status post 7 cycles and tolerated his treatment fairly well. He also completed a course of consolidation immunotherapy with Imfinzi (Durvalumab) status post 26 cycles.  He tolerated this treatment well. The patient is currently on observation and he is feeling fine with no concerning complaints. He had repeat CT scan of the chest performed recently.  I personally and independently reviewed the scans and discussed the results with the patient today. His scan showed no concerning findings for disease progression. I recommended for him to continue on observation with repeat CT scan of the chest in 4 months. The patient was advised to call immediately if he has any concerning symptoms in the interval. The patient voices understanding of current disease status and treatment options and is in agreement with the current care plan. All questions were answered. The patient knows to call the clinic with any problems, questions or concerns. We can certainly see the patient much sooner if necessary.  Disclaimer: This note was dictated with voice recognition software. Similar sounding words can inadvertently be transcribed and may not be corrected upon review.

## 2019-05-14 NOTE — Telephone Encounter (Signed)
Gave avs and calendar ° °

## 2019-05-30 ENCOUNTER — Encounter: Payer: Self-pay | Admitting: Student

## 2019-05-30 ENCOUNTER — Telehealth (INDEPENDENT_AMBULATORY_CARE_PROVIDER_SITE_OTHER): Payer: Medicare Other | Admitting: Student

## 2019-05-30 VITALS — BP 112/70 | HR 94 | Ht 66.0 in | Wt 209.0 lb

## 2019-05-30 DIAGNOSIS — Z86711 Personal history of pulmonary embolism: Secondary | ICD-10-CM

## 2019-05-30 DIAGNOSIS — J449 Chronic obstructive pulmonary disease, unspecified: Secondary | ICD-10-CM

## 2019-05-30 DIAGNOSIS — I5032 Chronic diastolic (congestive) heart failure: Secondary | ICD-10-CM

## 2019-05-30 DIAGNOSIS — I10 Essential (primary) hypertension: Secondary | ICD-10-CM

## 2019-05-30 MED ORDER — RIVAROXABAN 20 MG PO TABS: ORAL_TABLET | ORAL | 3 refills | Status: AC

## 2019-05-30 NOTE — Progress Notes (Signed)
Virtual Visit via Telephone Note   This visit type was conducted due to national recommendations for restrictions regarding the COVID-19 Pandemic (e.g. social distancing) in an effort to limit this patient's exposure and mitigate transmission in our community.  Due to his co-morbid illnesses, this patient is at least at moderate risk for complications without adequate follow up.  This format is felt to be most appropriate for this patient at this time.  The patient did not have access to video technology/had technical difficulties with video requiring transitioning to audio format only (telephone).  All issues noted in this document were discussed and addressed.  No physical exam could be performed with this format.  Please refer to the patient's chart for his  consent to telehealth for [facility]   Date:  05/30/2019   ID:  James Zamora, DOB [birth year], MRN [id number]  Patient Location: Home Provider Location: Office  PCP:  [name], MD  Cardiologist:  [name], MD  Electrophysiologist:  None   Evaluation Performed:  Follow-Up Visit  Chief Complaint: No specific complaints  History of Present Illness:    James Zamora is a 63 y.o. male with past medical history of chronic diastolic CHF, HTN, COPD (on 4L [place] at baseline), NSCLC (s/p chemotherapy and radiation), and history of PE (on Xarelto) who presents for a 61-month follow-up telehealth visit.  He most recently had a phone visit with Dr. Harrington Challenger in 01/2019 and reported his breathing was overall stable. Denies any specific orthopnea or PND and edema had improved as compared to prior examinations. No changes were made to his current medication regimen and he was continued on Lasix 80 mg daily along with Metolazone 2.5 mg at least once weekly. He did have repeat labs with the cancer center on 05/12/2019 and creatinine was stable at 1.24 with electrolytes WNL.   In talking with the patient and his sister today, he reports  overall doing well from a cardiac perspective since his last visit. Has baseline dyspnea on exertion in the setting of COPD but denies any acute change in his symptoms. He utilizes 4L nasal cannula during the day and at night.  He denies any specific orthopnea or PND. Denies any recent chest pain or palpitations. He does have chronic lower extremity edema which has overall been stable per his report. He does not weigh himself daily and weight has been variable as this was 206 lbs in 10/2018, at 221 lbs two weeks ago, then at 209 lbs today. He reports good compliance with Lasix 80mg  daily and using Metolazone every other day.   BP has overall been stable, at 112/70 on most recent check. He denies any recent lightheadedness, dizziness, or presyncope.   The patient does not have symptoms concerning for COVID-19 infection (fever, chills, cough, or new shortness of breath).    Past Medical History:  Diagnosis Date   Adenocarcinoma of left lung, stage 3 ([place]) 05/10/2017   Aortic atherosclerosis ([place]) 07/31/2017   Asthma    Chronic diastolic CHF (congestive heart failure) ([place])    a. 07/2017: echo showing EF of 65-70% with Grade 1 DD   Chronic fatigue 08/02/2017   Lung mass    Non-small cell lung cancer ([place]) 11/13/2015   Obesity    Perforated bowel ([place])    Past Surgical History:  Procedure Laterality Date   CHOLECYSTECTOMY     COLONOSCOPY N/A 07/31/2016   Procedure: COLONOSCOPY;  Surgeon: Danie Binder, MD;  [facility];  Service: Endoscopy;  Laterality: N/A;  1:45 pm   ESOPHAGOGASTRODUODENOSCOPY N/A 07/31/2016   Procedure: ESOPHAGOGASTRODUODENOSCOPY (EGD);  Surgeon: Danie Binder, MD;  [facility];  Service: Endoscopy;  Laterality: N/A;   EUS N/A 06/07/2017   Procedure: UPPER ENDOSCOPIC ULTRASOUND (EUS) LINEAR;  Surgeon: Milus Banister, MD;  Location[facility];  Service: Endoscopy;  Laterality: N/A;   HERNIA REPAIR     IR FLUORO GUIDE PORT INSERTION  RIGHT  07/11/2017   IR US GUIDE VASC ACCESS RIGHT  07/11/2017     Current Meds  Medication Sig   aspirin EC 81 MG EC tablet Take 1 tablet (81 mg total) by mouth daily.   fluticasone furoate-vilanterol (BREO ELLIPTA) 200-25 MCG/INH AEPB Inhale 1 puff into the lungs daily.   furosemide (LASIX) 80 MG tablet Take 1 tablet by mouth once daily   lidocaine-prilocaine (EMLA) cream Apply 1 application topically as needed. Apply small amount to port site 1-1.5 hours before treatment. Cover with plastic wrap.   metolazone (ZAROXOLYN) 2.5 MG tablet Take 1 tablet (2.5 mg total) by mouth every other day. Take 30 minutes prior to taking Lasix   OXYGEN Inhale 4 L into the lungs continuous.    potassium chloride SA (K-DUR,KLOR-CON) 20 MEQ tablet Take 1 tablet (20 mEq total) by mouth every other day. Take on the days you take Metolazone   rivaroxaban (XARELTO) 20 MG TABS tablet TAKE 1 TABLET BY MOUTH ONCE DAILY WITH SUPPER   [DISCONTINUED] rivaroxaban (XARELTO) 20 MG TABS tablet TAKE 1 TABLET BY MOUTH ONCE DAILY WITH SUPPER     Allergies:   Bee venom and Penicillins   Social History   Tobacco Use   Smoking status: Former Smoker    Packs/day: 2.00    Years: 10.00    Pack years: 20.00    Types: Cigarettes    Quit date: 07/06/2012    Years since quitting: 6.9   Smokeless tobacco: Former Systems developer    Types: Chew    Quit date: 07/06/2014  Substance Use Topics   Alcohol use: No   Drug use: No     Family Hx: The patient's family history includes Asthma in his father; Diabetes in his mother; Hypertension in his father and mother; Prostate cancer in his father. There is no history of Colon cancer.  ROS:   Please see the history of present illness.     All other systems reviewed and are negative.   Prior CV studies:   The following studies were reviewed today:  Echocardiogram: 10/2018 Study Conclusions  - Left ventricle: The cavity size was normal. Wall thickness was   increased in a  pattern of mild LVH. Systolic function was   vigorous. The estimated ejection fraction was in the range of 65%   to 70%. - Right ventricle: Systolic function was mildly reduced.  Labs/Other Tests and Data Reviewed:    EKG:  No ECG reviewed.  Recent Labs: 10/28/2018: B Natriuretic Peptide 25.0 02/05/2019: TSH 0.797 05/12/2019: ALT 6; BUN 10; Creatinine 1.24; Hemoglobin 11.9; Platelet Count 205; Potassium 4.1; Sodium 137   Recent Lipid Panel No results found for: CHOL, TRIG, HDL, CHOLHDL, LDLCALC, LDLDIRECT  Wt Readings from Last 3 Encounters:  05/30/19 209 lb (94.8 kg)  05/14/19 221 lb 1.6 oz (100.3 kg)  11/28/18 192 lb (87.1 kg)     Objective:    Vital Signs:  BP 112/70    Pulse 94    Ht 5\' 6"  (1.676 m)    Wt  209 lb (94.8 kg)    SpO2 97%    BMI 33.73 kg/m    General: Pleasant male sounding in NAD Psych: Normal affect. Neuro: Alert and oriented X 3. Lungs:  Resp regular and unlabored while talking on the phone.   ASSESSMENT & PLAN:    1. Chronic Diastolic CHF - most recent echocardiogram in 10/2018 showed a preserved EF of 65-70% with mildly reduced RV function which was thought to be secondary to his underlying lung issues.  - he has baseline dyspnea on exertion secondary to COPD but denies any recent orthopnea or PND. Lower extremity edema has been stable. He has refused compression stockings in the past and does not elevate his lower extremities during the day. Family members have also encouraged him to do this without much success. Continued sodium and fluid restriction reviewed.  - continue with Lasix 80 mg daily and Metolazone 2.5 mg every other day (patient believes he takes this about 2-3 times weekly). Creatinine was stable at 1.24 with K+ at 4.1 when recently checked on 05/12/2019.  2. HTN - BP has overall been stable, at 112/70 on most recent check.  He remains on Lasix as outlined above but is not on any additional anti-hypertensive medications.  3. History of PE -  he denies any melena, hematochezia, or hematuria. Hgb stable at 11.9 when checked on 7/13 and platelets at 205. He has been on Xarelto per his PCP but is out of refills. Will send in at this time. He is also on ASA and there is no indication for him to be on this along with anticoagulation from a cardiac perspective. Encouraged them to review this was his PCP as well but suspect ASA could be discontinued.   4. COPD/NSCLC - On 4L [place] at baseline for his COPD. NSCLC is followed by Dr. Julien Nordmann. He is s/p chemotherapy and radiation. Scheduled for repeat imaging in 4 months.    COVID-19 Education: The signs and symptoms of COVID-19 were discussed with the patient and how to seek care for testing (follow up with PCP or arrange E-visit).  The importance of social distancing was discussed today.  Time:   Today, I have spent 17 minutes with the patient with telehealth technology discussing the above problems.     Medication Adjustments/Labs and Tests Ordered: Current medicines are reviewed at length with the patient today.  Concerns regarding medicines are outlined above.   Tests Ordered: No orders of the defined types were placed in this encounter.   Medication Changes: Meds ordered this encounter  Medications   rivaroxaban (XARELTO) 20 MG TABS tablet    Sig: TAKE 1 TABLET BY MOUTH ONCE DAILY WITH SUPPER    Dispense:  30 tablet    Refill:  3    Order Specific Question:   Supervising Provider    Answer:   Dorothy Spark [[id number]]    Follow Up:  In Person in 4 month(s)  Signed, Erma Heritage, PA-C  05/30/2019 4:22 PM    [place]

## 2019-05-30 NOTE — Patient Instructions (Signed)
Medication Instructions:  Your physician recommends that you continue on your current medications as directed. Please refer to the Current Medication list given to you today.  If you need a refill on your cardiac medications before your next appointment, please call your pharmacy.   Lab work: NONE  If you have labs (blood work) drawn today and your tests are completely normal, you will receive your results only by: Marland Kitchen MyChart Message (if you have MyChart) OR . A paper copy in the mail If you have any lab test that is abnormal or we need to change your treatment, we will call you to review the results.  Testing/Procedures: NONE   Follow-Up: At [facility], you and your health needs are our priority.  As part of our continuing mission to provide you with exceptional heart care, we have created designated Provider Care Teams.  These Care Teams include your primary Cardiologist (physician) and Advanced Practice Providers (APPs -  Physician Assistants and Nurse Practitioners) who all work together to provide you with the care you need, when you need it. You will need a follow up appointment in 4-5 months.  Please call our office 2 months in advance to schedule this appointment.  You may see [name], MD or one of the following Advanced Practice Providers on your designated Care Team:   Bernerd Pho, PA-C [facility]) . Ermalinda Barrios, PA-C ([place])  Any Other Special Instructions Will Be Listed Below (If Applicable). Thank you for choosing [facility]!

## 2019-06-11 ENCOUNTER — Inpatient Hospital Stay: Payer: Medicare Other | Attending: Internal Medicine

## 2019-06-11 ENCOUNTER — Other Ambulatory Visit: Payer: Self-pay

## 2019-06-11 ENCOUNTER — Other Ambulatory Visit: Payer: Medicare Other

## 2019-06-11 DIAGNOSIS — Z452 Encounter for adjustment and management of vascular access device: Secondary | ICD-10-CM | POA: Insufficient documentation

## 2019-06-11 DIAGNOSIS — C3432 Malignant neoplasm of lower lobe, left bronchus or lung: Secondary | ICD-10-CM | POA: Diagnosis not present

## 2019-06-11 MED ORDER — HEPARIN SOD (PORK) LOCK FLUSH 100 UNIT/ML IV SOLN
500.0000 [IU] | Freq: Once | INTRAVENOUS | Status: AC
  Administered 2019-06-11: 11:00:00 500 [IU]
  Filled 2019-06-11: qty 5

## 2019-06-11 MED ORDER — SODIUM CHLORIDE 0.9% FLUSH
10.0000 mL | Freq: Once | INTRAVENOUS | Status: AC
  Administered 2019-06-11: 10 mL
  Filled 2019-06-11: qty 10

## 2019-07-23 ENCOUNTER — Other Ambulatory Visit: Payer: Medicare Other

## 2019-07-23 ENCOUNTER — Inpatient Hospital Stay: Payer: Medicare Other | Attending: Internal Medicine

## 2019-07-23 ENCOUNTER — Other Ambulatory Visit: Payer: Self-pay

## 2019-07-23 DIAGNOSIS — Z452 Encounter for adjustment and management of vascular access device: Secondary | ICD-10-CM | POA: Diagnosis not present

## 2019-07-23 DIAGNOSIS — C3432 Malignant neoplasm of lower lobe, left bronchus or lung: Secondary | ICD-10-CM | POA: Diagnosis not present

## 2019-07-23 MED ORDER — SODIUM CHLORIDE 0.9% FLUSH
10.0000 mL | Freq: Once | INTRAVENOUS | Status: AC
  Administered 2019-07-23: 10 mL
  Filled 2019-07-23: qty 10

## 2019-07-23 MED ORDER — HEPARIN SOD (PORK) LOCK FLUSH 100 UNIT/ML IV SOLN
500.0000 [IU] | Freq: Once | INTRAVENOUS | Status: AC
  Administered 2019-07-23: 500 [IU]
  Filled 2019-07-23: qty 5

## 2019-07-23 NOTE — Patient Instructions (Signed)

## 2019-07-29 ENCOUNTER — Encounter: Payer: Self-pay | Admitting: Gastroenterology

## 2019-08-11 DIAGNOSIS — I5032 Chronic diastolic (congestive) heart failure: Secondary | ICD-10-CM | POA: Diagnosis not present

## 2019-08-11 DIAGNOSIS — C3432 Malignant neoplasm of lower lobe, left bronchus or lung: Secondary | ICD-10-CM | POA: Diagnosis not present

## 2019-08-11 DIAGNOSIS — Z9981 Dependence on supplemental oxygen: Secondary | ICD-10-CM | POA: Diagnosis not present

## 2019-08-11 DIAGNOSIS — J449 Chronic obstructive pulmonary disease, unspecified: Secondary | ICD-10-CM | POA: Diagnosis not present

## 2019-08-26 ENCOUNTER — Other Ambulatory Visit: Payer: Self-pay

## 2019-08-26 ENCOUNTER — Encounter: Payer: Self-pay | Admitting: Gastroenterology

## 2019-08-26 ENCOUNTER — Ambulatory Visit (INDEPENDENT_AMBULATORY_CARE_PROVIDER_SITE_OTHER): Payer: Medicare Other | Admitting: Gastroenterology

## 2019-08-26 DIAGNOSIS — Z86010 Personal history of colonic polyps: Secondary | ICD-10-CM

## 2019-08-26 NOTE — Progress Notes (Addendum)
REVIEWED. UNDERGOING CHEMO/XRT FOR LUNG Ca. PT HIGH RISK FOR ANESTHESIA DUE TO HIGH 02 REQUIREMENT. NO INDICATION FOR SURVEILLANCE COLONOSCOPY AT THIS TIME.  Primary Care Physician:  [name], MD Primary Gastroenterologist:  Dr. Oneida Alar   Chief Complaint  Patient presents with  . Consult    due for 3 yr TCS    HPI:   James Zamora is a 63 y.o. male presenting today to arrange surveillance colonoscopy, with history of tubular adenomas in 2017 (Two 11 to 13 mm polyps in rectum and ascending colon, redundant colon, non-bleeding internal). In interim since last seen, he was diagnosed with stage IIIA non-small cell lung cancer, adenocarcinoma diagnosed in June 2018. He underwent concurrent chemoradiation and currently in observation mode. Repeat CT July 2020 without disease progression. History of DVT/PE in 2018 and on Xarelto.   He is on 5 liters O2 daily. Notes dyspnea on exertion. No chest pain. No abdominal pain, N/V. No dysphagia. No rectal bleeding. No changes in bowel habits.   Past Medical History:  Diagnosis Date  . Adenocarcinoma of left lung, stage 3 ([place]) 05/10/2017  . Aortic atherosclerosis ([place]) 07/31/2017  . Asthma   . Chronic diastolic CHF (congestive heart failure) ([place])    a. 07/2017: echo showing EF of 65-70% with Grade 1 DD  . Chronic fatigue 08/02/2017  . Lung mass   . Non-small cell lung cancer ([place]) 11/13/2015  . Obesity   . Perforated bowel [facility])     Past Surgical History:  Procedure Laterality Date  . CHOLECYSTECTOMY    . COLONOSCOPY N/A 07/31/2016   Two 11 to 13 mm polyps in rectum and ascending colon, redundant colon, non-bleeding internal. 3-year-surveillance needed. Tubular adenomas  . ESOPHAGOGASTRODUODENOSCOPY N/A 07/31/2016   normal esophagus, gastritis s/p biopsy, reactive gastropathy.   . EUS N/A 06/07/2017   Procedure: UPPER ENDOSCOPIC ULTRASOUND (EUS) LINEAR;  Surgeon: Milus Banister, MD;  Location: [facility];  Service: Endoscopy;   Laterality: N/A;  . HERNIA REPAIR    . IR FLUORO GUIDE PORT INSERTION RIGHT  07/11/2017  . IR US GUIDE VASC ACCESS RIGHT  07/11/2017    Current Outpatient Medications  Medication Sig Dispense Refill  . aspirin EC 81 MG EC tablet Take 1 tablet (81 mg total) by mouth daily. 30 tablet 1  . fluticasone furoate-vilanterol (BREO ELLIPTA) 200-25 MCG/INH AEPB Inhale 1 puff into the lungs daily. 1 each 5  . furosemide (LASIX) 80 MG tablet Take 1 tablet by mouth once daily 90 tablet 3  . lidocaine-prilocaine (EMLA) cream Apply 1 application topically as needed. Apply small amount to port site 1-1.5 hours before treatment. Cover with plastic wrap. 30 g 0  . metolazone (ZAROXOLYN) 2.5 MG tablet Take 1 tablet (2.5 mg total) by mouth every other day. Take 30 minutes prior to taking Lasix 45 tablet 3  . OXYGEN Inhale 5 L into the lungs continuous.     . potassium chloride SA (K-DUR,KLOR-CON) 20 MEQ tablet Take 1 tablet (20 mEq total) by mouth every other day. Take on the days you take Metolazone 45 tablet 3  . rivaroxaban (XARELTO) 20 MG TABS tablet TAKE 1 TABLET BY MOUTH ONCE DAILY WITH SUPPER 30 tablet 3   No current facility-administered medications for this visit.     Allergies as of 08/26/2019 - Review Complete 08/26/2019  Allergen Reaction Noted  . Bee venom Anaphylaxis 05/10/2017  . Penicillins Itching and Swelling 06/12/2011    Family History  Problem Relation  Age of Onset  . Diabetes Mother   . Hypertension Mother   . Asthma Father   . Prostate cancer Father   . Hypertension Father   . Colon cancer Neg Hx     Social History   Socioeconomic History  . Marital status: Single    Spouse name: Not on file  . Number of children: Not on file  . Years of education: Not on file  . Highest education level: Not on file  Occupational History  . Not on file  Social Needs  . Financial resource strain: Not on file  . Food insecurity    Worry: Not on file    Inability: Not on file  .  Transportation needs    Medical: Not on file    Non-medical: Not on file  Tobacco Use  . Smoking status: Former Smoker    Packs/day: 2.00    Years: 10.00    Pack years: 20.00    Types: Cigarettes    Quit date: 07/06/2012    Years since quitting: 7.1  . Smokeless tobacco: Former Systems developer    Types: [place] date: 07/06/2014  Substance and Sexual Activity  . Alcohol use: No  . Drug use: No  . Sexual activity: Never  Lifestyle  . Physical activity    Days per week: Not on file    Minutes per session: Not on file  . Stress: Not on file  Relationships  . Social Herbalist on phone: Not on file    Gets together: Not on file    Attends religious service: Not on file    Active member of club or organization: Not on file    Attends meetings of clubs or organizations: Not on file    Relationship status: Not on file  . Intimate partner violence    Fear of current or ex partner: Not on file    Emotionally abused: Not on file    Physically abused: Not on file    Forced sexual activity: Not on file  Other Topics Concern  . Not on file  Social History Narrative  . Not on file    Review of Systems: Gen: Denies any fever, chills, fatigue, weight loss, lack of appetite.  CV: Denies chest pain, heart palpitations, peripheral edema, syncope.  Resp: see HPI GI: see HPI GU : Denies urinary burning, urinary frequency, urinary hesitancy MS: Denies joint pain, muscle weakness, cramps, or limitation of movement.  Derm: Denies rash, itching, dry skin Psych: Denies depression, anxiety, memory loss, and confusion Heme: see HPI  Physical Exam: BP 119/68   Pulse 98   Temp (!) 97.1 F (36.2 C) (Oral)   Ht 5\' 6"  (1.676 m)   Wt 227 lb 3.2 oz (103.1 kg)   BMI 36.67 kg/m  General:   Alert and oriented. Pleasant and cooperative. Well-nourished and well-developed. 5 liters O2.   Head:  Normocephalic and atraumatic. Eyes:  Without icterus, sclera clear and conjunctiva pink.  Ears:   Normal auditory acuity. Lungs:  Clear to auscultation bilaterally.  Heart:  S1, S2 present without murmurs appreciated.  Abdomen:  +BS, soft, non-tender and non-distended. No HSM noted. No guarding or rebound. No masses appreciated.  Rectal:  Deferred  Extremities:  With  edema. Neurologic:  Alert and  oriented x4 Psych:  Alert and cooperative. Normal mood and affect.  ASSESSMENT/Plan: 63 year old male with history of adenomas in 2017 and due for surveillance; however, he was diagnosed with  stage IIIA non-small cell lung adenocarcinoma in June 2018, completing chemoradiation and now in observation mode. He had a CT in July 2020 without progression of disease. History also notable for chronic anticoagulation with Xarelto s/p DVT/PE in 2018. He has no concerning lower or upper GI signs/symptoms. Discussed with him risk and benefits of colonoscopy today. Will discuss with Dr. Oneida Alar prior to arranging, to ensure risks do not outweigh benefits at this time in light of medical history. Would recommend Propofol for colonoscopy. Further recommendations to follow.   Annitta Needs, PhD, ANP-BC [facility]

## 2019-08-26 NOTE — Patient Instructions (Signed)
I will talk with your oncologist and Dr. Oneida Alar before arranging a procedure for surveillance colonoscopy.  Further recommendations to follow!  It was a pleasure to see you today. I want to create trusting relationships with patients to provide genuine, compassionate, and quality care. I value your feedback. If you receive a survey regarding your visit,  I greatly appreciate you taking time to fill this out.   Annitta Needs, PhD, ANP-BC [facility]

## 2019-08-29 ENCOUNTER — Telehealth: Payer: Self-pay | Admitting: Gastroenterology

## 2019-08-29 ENCOUNTER — Encounter: Payer: Self-pay | Admitting: Gastroenterology

## 2019-08-29 NOTE — Telephone Encounter (Signed)
Doris:  Please let patient know I reviewed with Dr. Oneida Alar his history and current medical issues. We are going to hold off on colonoscopy and reassess in 1 year. Please arrange 1 year follow-up.

## 2019-09-01 NOTE — Telephone Encounter (Signed)
Pt's sister, [name] is aware. Forwarding to [facility] to nic an OV in one year to reassess.

## 2019-09-02 NOTE — Telephone Encounter (Signed)
ON RECALL  °

## 2019-09-12 ENCOUNTER — Other Ambulatory Visit: Payer: Self-pay

## 2019-09-12 ENCOUNTER — Inpatient Hospital Stay: Payer: Medicare Other | Attending: Internal Medicine

## 2019-09-12 ENCOUNTER — Encounter (HOSPITAL_COMMUNITY): Payer: Self-pay

## 2019-09-12 ENCOUNTER — Other Ambulatory Visit: Payer: Medicare Other

## 2019-09-12 ENCOUNTER — Inpatient Hospital Stay: Payer: Medicare Other

## 2019-09-12 ENCOUNTER — Inpatient Hospital Stay (HOSPITAL_BASED_OUTPATIENT_CLINIC_OR_DEPARTMENT_OTHER): Payer: Medicare Other | Admitting: Medical

## 2019-09-12 ENCOUNTER — Other Ambulatory Visit: Payer: Self-pay | Admitting: Physician Assistant

## 2019-09-12 ENCOUNTER — Ambulatory Visit (HOSPITAL_COMMUNITY)
Admission: RE | Admit: 2019-09-12 | Discharge: 2019-09-12 | Disposition: A | Discharge status: Home / Self Care | Payer: Medicare Other | Source: Ambulatory Visit | Attending: Internal Medicine | Admitting: Internal Medicine

## 2019-09-12 DIAGNOSIS — Z7982 Long term (current) use of aspirin: Secondary | ICD-10-CM | POA: Diagnosis not present

## 2019-09-12 DIAGNOSIS — Z79899 Other long term (current) drug therapy: Secondary | ICD-10-CM | POA: Diagnosis not present

## 2019-09-12 DIAGNOSIS — C3490 Malignant neoplasm of unspecified part of unspecified bronchus or lung: Secondary | ICD-10-CM | POA: Diagnosis not present

## 2019-09-12 DIAGNOSIS — C3432 Malignant neoplasm of lower lobe, left bronchus or lung: Secondary | ICD-10-CM | POA: Insufficient documentation

## 2019-09-12 DIAGNOSIS — J449 Chronic obstructive pulmonary disease, unspecified: Secondary | ICD-10-CM | POA: Diagnosis not present

## 2019-09-12 DIAGNOSIS — Z7951 Long term (current) use of inhaled steroids: Secondary | ICD-10-CM | POA: Diagnosis not present

## 2019-09-12 DIAGNOSIS — I5032 Chronic diastolic (congestive) heart failure: Secondary | ICD-10-CM | POA: Insufficient documentation

## 2019-09-12 DIAGNOSIS — Z7901 Long term (current) use of anticoagulants: Secondary | ICD-10-CM | POA: Diagnosis not present

## 2019-09-12 DIAGNOSIS — I878 Other specified disorders of veins: Secondary | ICD-10-CM

## 2019-09-12 DIAGNOSIS — J9611 Chronic respiratory failure with hypoxia: Secondary | ICD-10-CM

## 2019-09-12 LAB — CBC WITH DIFFERENTIAL (CANCER CENTER ONLY)
Abs Immature Granulocytes: 0.02 10*3/uL (ref 0.00–0.07)
Basophils Absolute: 0 10*3/uL (ref 0.0–0.1)
Basophils Relative: 0 %
Eosinophils Absolute: 0.2 10*3/uL (ref 0.0–0.5)
Eosinophils Relative: 4 %
HCT: 36 % — ABNORMAL LOW (ref 39.0–52.0)
Hemoglobin: 10.6 g/dL — ABNORMAL LOW (ref 13.0–17.0)
Immature Granulocytes: 0 %
Lymphocytes Relative: 21 %
Lymphs Abs: 1.2 10*3/uL (ref 0.7–4.0)
MCH: 25.4 pg — ABNORMAL LOW (ref 26.0–34.0)
MCHC: 29.4 g/dL — ABNORMAL LOW (ref 30.0–36.0)
MCV: 86.1 fL (ref 80.0–100.0)
Monocytes Absolute: 0.5 10*3/uL (ref 0.1–1.0)
Monocytes Relative: 9 %
Neutro Abs: 3.7 10*3/uL (ref 1.7–7.7)
Neutrophils Relative %: 66 %
Platelet Count: 183 10*3/uL (ref 150–400)
RBC: 4.18 MIL/uL — ABNORMAL LOW (ref 4.22–5.81)
RDW: 16 % — ABNORMAL HIGH (ref 11.5–15.5)
WBC Count: 5.6 10*3/uL (ref 4.0–10.5)
nRBC: 0 % (ref 0.0–0.2)

## 2019-09-12 LAB — CMP (CANCER CENTER ONLY)
ALT: 9 U/L (ref 0–44)
AST: 13 U/L — ABNORMAL LOW (ref 15–41)
Albumin: 3.7 g/dL (ref 3.5–5.0)
Alkaline Phosphatase: 93 U/L (ref 38–126)
Anion gap: 9 (ref 5–15)
BUN: 7 mg/dL — ABNORMAL LOW (ref 8–23)
CO2: 31 mmol/L (ref 22–32)
Calcium: 8.9 mg/dL (ref 8.9–10.3)
Chloride: 102 mmol/L (ref 98–111)
Creatinine: 1.12 mg/dL (ref 0.61–1.24)
GFR, Est AFR Am: >60 mL/min (ref >60)
GFR, Estimated: >60 mL/min (ref >60)
Glucose, Bld: 90 mg/dL (ref 70–99)
Potassium: 4.1 mmol/L (ref 3.5–5.1)
Sodium: 142 mmol/L (ref 135–145)
Total Bilirubin: 0.3 mg/dL (ref 0.3–1.2)
Total Protein: 7.5 g/dL (ref 6.5–8.1)

## 2019-09-12 MED ORDER — HEPARIN SOD (PORK) LOCK FLUSH 100 UNIT/ML IV SOLN
500.0000 [IU] | INTRAVENOUS | Status: AC | PRN
  Administered 2019-09-12: 500 [IU]
  Filled 2019-09-12: qty 5

## 2019-09-12 MED ORDER — LIDOCAINE-PRILOCAINE 2.5-2.5 % EX CREA: 1.0000 "application " | TOPICAL_CREAM | CUTANEOUS | 0 refills | Status: AC | PRN

## 2019-09-12 MED ORDER — SODIUM CHLORIDE 0.9% FLUSH
10.0000 mL | Freq: Once | INTRAVENOUS | Status: AC
  Administered 2019-09-12: 10 mL
  Filled 2019-09-12: qty 10

## 2019-09-12 MED ORDER — IOHEXOL 300 MG/ML  SOLN
75.0000 mL | Freq: Once | INTRAMUSCULAR | Status: AC | PRN
  Administered 2019-09-12: 75 mL via INTRAVENOUS

## 2019-09-12 NOTE — Progress Notes (Signed)
Pt port de accessed, flushed with heparin 500 units, sterile dressing applied, pt tolerated well,

## 2019-09-12 NOTE — Patient Instructions (Addendum)

## 2019-09-12 NOTE — Progress Notes (Signed)
[name] was seen today for a port flush and to have his port accessed for restaging CT scans later today at [facility].  On his way out he became acutely short of breath.  He was seen in the front of the cancer center at which time his oxygen saturation registered 79% on 5 L of oxygen via nasal cannula with a pulse of 113 and a blood pressure of 119/83.  This case was reviewed with Dr. Julien Nordmann who was in agreement that the patient needed to be transported to the emergency room for evaluation and management however on returning upfront to see the patient again he stated that he did not want to go to the emergency room.  He was also noted that his oxygen saturation recovered to 100% on 6 L of O2 by nasal cannula. His pulse was still elevated 113 with a blood pressure 129/70.  Oxygen was stopped with the patient given a trial of room air.  He was stable with an oxygen saturation of 98 to 100%.  This was again discussed with Dr. Julien Nordmann who was in agreement that [name] did not need to be seen in the emergency room.  The patient was instructed to keep his CT scan appointment later today and to present to the emergency room or call 911 should he have recurrence of shortness of breath over his baseline.  Sandi Mealy, MHS, PA-C Physician Assistant

## 2019-09-15 ENCOUNTER — Telehealth: Payer: Self-pay | Admitting: Internal Medicine

## 2019-09-15 ENCOUNTER — Other Ambulatory Visit: Payer: Self-pay

## 2019-09-15 ENCOUNTER — Encounter: Payer: Self-pay | Admitting: Internal Medicine

## 2019-09-15 ENCOUNTER — Inpatient Hospital Stay (HOSPITAL_BASED_OUTPATIENT_CLINIC_OR_DEPARTMENT_OTHER): Payer: Medicare Other | Admitting: Internal Medicine

## 2019-09-15 DIAGNOSIS — C3490 Malignant neoplasm of unspecified part of unspecified bronchus or lung: Secondary | ICD-10-CM

## 2019-09-15 DIAGNOSIS — C3432 Malignant neoplasm of lower lobe, left bronchus or lung: Secondary | ICD-10-CM | POA: Diagnosis not present

## 2019-09-15 NOTE — Progress Notes (Signed)
[facility] [phone]   [phone]  OFFICE PROGRESS NOTE  [name], MD [address] [place] [address]  DIAGNOSIS: Stage IIIA (T2a, N2, M0) non-small cell lung cancer, adenocarcinoma presented with left lower lobe lung mass in addition to mediastinal lymphadenopathy diagnosed in June 2018. The patient also has suspicious soft tissue density in the pancreatic head but this has been present for 3 years on previous CT scan and unlikely to be related to his current diagnosis of the lung cancer.  PRIOR THERAPY:  1) Concurrent chemoradiation with weekly carboplatin for AUC of 2 and paclitaxel 45 MG/M2. Status post 7 cycles, last dose was given 07/03/2017. 2) Consolidation treatment with immunotherapy with Imfinzi (Durvalumab) 10 MG/KG every 2 weeks, first dose 08/15/2017.  Status post 26 cycles.  CURRENT THERAPY: Observation.  INTERVAL HISTORY: CHADDRICK BRUE 63 y.o. male returns to the clinic today for 4 months follow-up visit.  The patient is feeling fine today with no concerning complaints except for the baseline shortness of breath and he is currently on home oxygen.  He denied having any chest pain, cough or hemoptysis.  He denied having any recent weight loss or night sweats.  He has no nausea, vomiting, diarrhea or constipation.  He has been in observation since completion of his consolidation immunotherapy and has been doing very well.  He had repeat CT scan of the chest performed recently and he is here for evaluation and discussion of his discuss results.  MEDICAL HISTORY: Past Medical History:  Diagnosis Date  . Adenocarcinoma of left lung, stage 3 ([place]) 05/10/2017  . Aortic atherosclerosis ([place]) 07/31/2017  . Asthma   . Chronic diastolic CHF (congestive heart failure) ([place])    a. 07/2017: echo showing EF of 65-70% with Grade 1 DD  . Chronic fatigue 08/02/2017  . Lung mass   . Non-small cell lung cancer ([place]) 11/13/2015  .  Obesity   . Perforated bowel ([place])     ALLERGIES:  is allergic to bee venom and penicillins.  MEDICATIONS:  Current Outpatient Medications  Medication Sig Dispense Refill  . aspirin EC 81 MG EC tablet Take 1 tablet (81 mg total) by mouth daily. 30 tablet 1  . fluticasone furoate-vilanterol (BREO ELLIPTA) 200-25 MCG/INH AEPB Inhale 1 puff into the lungs daily. 1 each 5  . furosemide (LASIX) 80 MG tablet Take 1 tablet by mouth once daily 90 tablet 3  . lidocaine-prilocaine (EMLA) cream Apply 1 application topically as needed. Apply small amount to port site 1-1.5 hours before treatment. Cover with plastic wrap. 30 g 0  . metolazone (ZAROXOLYN) 2.5 MG tablet Take 1 tablet (2.5 mg total) by mouth every other day. Take 30 minutes prior to taking Lasix 45 tablet 3  . OXYGEN Inhale 5 L into the lungs continuous.     . potassium chloride SA (K-DUR,KLOR-CON) 20 MEQ tablet Take 1 tablet (20 mEq total) by mouth every other day. Take on the days you take Metolazone 45 tablet 3  . rivaroxaban (XARELTO) 20 MG TABS tablet TAKE 1 TABLET BY MOUTH ONCE DAILY WITH SUPPER 30 tablet 3   No current facility-administered medications for this visit.     SURGICAL HISTORY:  Past Surgical History:  Procedure Laterality Date  . CHOLECYSTECTOMY    . COLONOSCOPY N/A 07/31/2016   Two 11 to 13 mm polyps in rectum and ascending colon, redundant colon, non-bleeding internal. 3-year-surveillance needed. Tubular adenomas  . ESOPHAGOGASTRODUODENOSCOPY N/A  07/31/2016   normal esophagus, gastritis s/p biopsy, reactive gastropathy.   . EUS N/A 06/07/2017   Procedure: UPPER ENDOSCOPIC ULTRASOUND (EUS) LINEAR;  Surgeon: Milus Banister, MD;  [facility];  Service: Endoscopy;  Laterality: N/A;  . HERNIA REPAIR    . IR FLUORO GUIDE PORT INSERTION RIGHT  07/11/2017  . IR US GUIDE VASC ACCESS RIGHT  07/11/2017    REVIEW OF SYSTEMS:  A comprehensive review of systems was negative except for: Respiratory: positive for  dyspnea on exertion   PHYSICAL EXAMINATION: General appearance: alert, cooperative and no distress Head: Normocephalic, without obvious abnormality, atraumatic Neck: no adenopathy, no JVD, supple, symmetrical, trachea midline and thyroid not enlarged, symmetric, no tenderness/mass/nodules Lymph nodes: Cervical, supraclavicular, and axillary nodes normal. Resp: clear to auscultation bilaterally Back: symmetric, no curvature. ROM normal. No CVA tenderness. Cardio: regular rate and rhythm, S1, S2 normal, no murmur, click, rub or gallop GI: soft, non-tender; bowel sounds normal; no masses,  no organomegaly Extremities: extremities normal, atraumatic, no cyanosis or edema  ECOG PERFORMANCE STATUS: 1 - Symptomatic but completely ambulatory  Blood pressure 136/74, pulse (!) 115, temperature 98.3 F (36.8 C), temperature source Temporal, resp. rate 18, height 5\' 6"  (1.676 m), weight 243 lb 3.2 oz (110.3 kg), SpO2 93 %.  LABORATORY DATA: Lab Results  Component Value Date   WBC 5.6 09/12/2019   HGB 10.6 (L) 09/12/2019   HCT 36.0 (L) 09/12/2019   MCV 86.1 09/12/2019   PLT 183 09/12/2019      Chemistry      Component Value Date/Time   NA 142 09/12/2019 1000   NA 139 04-18-2017 1313   K 4.1 09/12/2019 1000   K 3.8 04-18-2017 1313   CL 102 09/12/2019 1000   CO2 31 09/12/2019 1000   CO2 27 04-18-2017 1313   BUN 7 (L) 09/12/2019 1000   BUN 9.8 04-18-2017 1313   CREATININE 1.12 09/12/2019 1000   CREATININE 0.9 04-18-2017 1313      Component Value Date/Time   CALCIUM 8.9 09/12/2019 1000   CALCIUM 8.6 04-18-2017 1313   ALKPHOS 93 09/12/2019 1000   ALKPHOS 57 04-18-2017 1313   AST 13 (L) 09/12/2019 1000   AST 9 04-18-2017 1313   ALT 9 09/12/2019 1000   ALT 6 04-18-2017 1313   BILITOT 0.3 09/12/2019 1000   BILITOT 0.23 04-18-2017 1313       RADIOGRAPHIC STUDIES: Ct Chest W Contrast  Result Date: 09/12/2019 CLINICAL DATA:  Non-small-cell lung cancer.  Restaging. EXAM: CT CHEST  WITH CONTRAST TECHNIQUE: Multidetector CT imaging of the chest was performed during intravenous contrast administration. CONTRAST:  69mL OMNIPAQUE IOHEXOL 300 MG/ML  SOLN COMPARISON:  05/13/2019 FINDINGS: Cardiovascular: The heart size is normal. No substantial pericardial effusion. Atherosclerotic calcification is noted in the wall of the thoracic aorta. Right Port-A-Cath tip is positioned in the upper right atrium. Prominence of the main pulmonary arteries suggests pulmonary arterial hypertension. Mediastinum/Nodes: Index prevascular node measured previously at 9 mm is stable at 9 mm today. 10 mm short axis subcarinal node is stable. No hilar lymphadenopathy. The esophagus has normal imaging features. Lungs/Pleura: Parahilar scarring left lung with associated volume loss is similar to prior. Volume loss with consolidative change in the retro hilar left lung is also stable. No new or progressive interval findings. Upper Abdomen: Similar appearance of bilateral adrenal gland thickening. Musculoskeletal: No worrisome lytic or sclerotic osseous abnormality. IMPRESSION: 1. No substantial interval change. No new or progressive interval findings. 2. Previously identified  upper normal mediastinal nodes are stable. Continued attention on follow-up recommended. 3.  Aortic Atherosclerois (ICD10-170.0) 4. Prominence of the main pulmonary arteries suggests pulmonary arterial hypertension. Electronically Signed   By: Misty Stanley M.D.   On: 09/12/2019 16:46    ASSESSMENT AND PLAN:  This is a very pleasant 63 years old African-American male recently diagnosed with a stage IIIA non-small cell lung cancer, adenocarcinoma presented with left lower lobe lung mass in addition to mediastinal lymphadenopathy.  The patient underwent a course of concurrent chemoradiation with weekly carboplatin and paclitaxel status post 7 cycles and tolerated his treatment fairly well. He also completed a course of consolidation immunotherapy  with Imfinzi (Durvalumab) status post 26 cycles. The patient tolerated his previous treatment well with no concerning complaints. He is currently on observation and feeling fine. He had repeat CT scan of the chest performed recently.  I personally and independently reviewed the scans and discussed the results with the patient today. His scan showed no concerning findings for disease recurrence or progression. I recommended for him to continue on observation with repeat CT scan of the chest in 4 months for restaging of his disease. For the COPD, he will continue with the home oxygen and his inhalers as recommended by his primary care physician and pulmonologist. He was advised to call immediately if he has any concerning symptoms in the interval. The patient voices understanding of current disease status and treatment options and is in agreement with the current care plan. All questions were answered. The patient knows to call the clinic with any problems, questions or concerns. We can certainly see the patient much sooner if necessary.  Disclaimer: This note was dictated with voice recognition software. Similar sounding words can inadvertently be transcribed and may not be corrected upon review.

## 2019-09-15 NOTE — Telephone Encounter (Signed)
Scheduled appt per 11/16 los. ° °Printed calendar and avs. °

## 2019-09-16 DIAGNOSIS — I5032 Chronic diastolic (congestive) heart failure: Secondary | ICD-10-CM | POA: Diagnosis not present

## 2019-09-16 DIAGNOSIS — Z9981 Dependence on supplemental oxygen: Secondary | ICD-10-CM | POA: Diagnosis not present

## 2019-09-16 DIAGNOSIS — J449 Chronic obstructive pulmonary disease, unspecified: Secondary | ICD-10-CM | POA: Diagnosis not present

## 2019-09-16 DIAGNOSIS — C3432 Malignant neoplasm of lower lobe, left bronchus or lung: Secondary | ICD-10-CM | POA: Diagnosis not present

## 2019-10-19 NOTE — Progress Notes (Signed)
Cardiology Office Note   Date:  10/21/2019   ID:  Almon, Whitford 24-Sep-1956, MRN [id number]  PCP:  [name], MD  Cardiologist:   [name], MD    F/U of HTN and diastolic CHF     History of Present Illness: James Zamora is a 63 y.o. male with a history ofof chronic diastolic CHF, HTN, COPD (on 4L [place] at baseline), NSCLC (s/p chemotherapy and radiation), and history of PE (on Xarelto)  I saw him for back in January in clinic  And as a follow up in a televisit in April 2020   He was also seen by B Ahmed Prima in July 2020 as televsiti   The pt is also followed in Oncology clinic   The pt comes in today with family   They report that he lives alone with no help   He is having difficulty with his portable oxygen   Say that he breathes through his mouth.     He denies CP   He says his breathing is fair   He really does not complain   Family say that he is eating salty foods, snacks    Swelling up in legs     Current Meds  Medication Sig  . aspirin EC 81 MG EC tablet Take 1 tablet (81 mg total) by mouth daily.  . fluticasone furoate-vilanterol (BREO ELLIPTA) 200-25 MCG/INH AEPB Inhale 1 puff into the lungs daily.  . furosemide (LASIX) 80 MG tablet Take 1 tablet by mouth once daily  . lidocaine-prilocaine (EMLA) cream Apply 1 application topically as needed. Apply small amount to port site 1-1.5 hours before treatment. Cover with plastic wrap.  . OXYGEN Inhale 5 L into the lungs continuous.   . potassium chloride SA (K-DUR,KLOR-CON) 20 MEQ tablet Take 1 tablet (20 mEq total) by mouth every other day. Take on the days you take Metolazone  . rivaroxaban (XARELTO) 20 MG TABS tablet TAKE 1 TABLET BY MOUTH ONCE DAILY WITH SUPPER  . [DISCONTINUED] metolazone (ZAROXOLYN) 2.5 MG tablet Take 1 tablet (2.5 mg total) by mouth every other day. Take 30 minutes prior to taking Lasix     Allergies:   Bee venom and Penicillins   Past Medical History:  Diagnosis Date  .  Adenocarcinoma of left lung, stage 3 ([place]) 05/10/2017  . Aortic atherosclerosis ([place]) 07/31/2017  . Asthma   . Chronic diastolic CHF (congestive heart failure) ([place])    a. 07/2017: echo showing EF of 65-70% with Grade 1 DD  . Chronic fatigue 08/02/2017  . Lung mass   . Non-small cell lung cancer ([place]) 11/13/2015  . Obesity   . Perforated bowel [facility])     Past Surgical History:  Procedure Laterality Date  . CHOLECYSTECTOMY    . COLONOSCOPY N/A 07/31/2016   Two 11 to 13 mm polyps in rectum and ascending colon, redundant colon, non-bleeding internal. 3-year-surveillance needed. Tubular adenomas  . ESOPHAGOGASTRODUODENOSCOPY N/A 07/31/2016   normal esophagus, gastritis s/p biopsy, reactive gastropathy.   . EUS N/A 06/07/2017   Procedure: UPPER ENDOSCOPIC ULTRASOUND (EUS) LINEAR;  Surgeon: Milus Banister, MD;  [facility];  Service: Endoscopy;  Laterality: N/A;  . HERNIA REPAIR    . IR FLUORO GUIDE PORT INSERTION RIGHT  07/11/2017  . IR US GUIDE VASC ACCESS RIGHT  07/11/2017     Social History:  The patient  reports that he quit smoking about 7 years ago. His smoking use included cigarettes. He has a 20.00  pack-year smoking history. He quit smokeless tobacco use about 5 years ago.  His smokeless tobacco use included chew. He reports that he does not drink alcohol or use drugs.   Family History:  The patient's family history includes Asthma in his father; Diabetes in his mother; Hypertension in his father and mother; Prostate cancer in his father.    ROS:  Please see the history of present illness. All other systems are reviewed and  Negative to the above problem except as noted.    PHYSICAL EXAM: VS:  BP 109/61   Pulse (!) 125   Temp (!) 97.5 F (36.4 C)   Wt 235 lb (106.6 kg)   SpO2 (!) 61%   BMI 37.93 kg/m    Sats on 4 L [place] 94 % GEN: Morbidly obese 63 yo  in no acute distress   Examined in chair   HEENT: normal  Neck: neck is full Cardiac: RRR; no murmurs, rubs, or  gallops  LE with 2+  edema  Respiratory:  Relatively clear   GI: soft, nontender,  MS: no deformity Moving all extremities   Skin: warm and dry, no rash Neuro:  Strength and sensation are intact Psych: quiet  EKG:  EKG is not ordered today.   Lipid Panel No results found for: CHOL, TRIG, HDL, CHOLHDL, VLDL, LDLCALC, LDLDIRECT    Wt Readings from Last 3 Encounters:  10/20/19 235 lb (106.6 kg)  09/15/19 243 lb 3.2 oz (110.3 kg)  08/26/19 227 lb 3.2 oz (103.1 kg)      ASSESSMENT AND PLAN: 1  Acute on chronic diastolic CHF   Pt has been very difficult to manage.   He is not compliant with diet it sounds   Extremely hypoxic on arrival   Volume is up on exam   I have reviewed CT of chest   Recom:  Will get labs today to guide med changes Discussed low Na diet again Will look into home health possiblities   Would be good for at home monitoring Discussed possible hosp admission for IV diuresis   Pt is not interested  2  HTN  BP is well ocntirolled   3  Hx COPD  Moving air  Oxygenation is poor unless supplemental oxygen is provided   4  Hx PE   COntinue with Xarelto  5  Hx NSCLCa   Followed in oncology by Dr Julien Nordmann   Current medicines are reviewed at length with the patient today.  The patient does not have concerns regarding medicines.  Signed, [name], MD  10/21/2019 10:44 AM    [address], [place], [place]  [address] Phone: [phone]; Fax: [phone]

## 2019-10-20 ENCOUNTER — Encounter: Payer: Self-pay | Admitting: Internal Medicine

## 2019-10-20 ENCOUNTER — Other Ambulatory Visit (HOSPITAL_COMMUNITY)
Admission: RE | Admit: 2019-10-20 | Discharge: 2019-10-20 | Disposition: A | Discharge status: Home / Self Care | Payer: Medicare Other | Source: Ambulatory Visit | Attending: Internal Medicine | Admitting: Internal Medicine

## 2019-10-20 ENCOUNTER — Ambulatory Visit (INDEPENDENT_AMBULATORY_CARE_PROVIDER_SITE_OTHER): Payer: Medicare Other | Admitting: Internal Medicine

## 2019-10-20 ENCOUNTER — Other Ambulatory Visit: Payer: Self-pay

## 2019-10-20 VITALS — BP 109/61 | HR 125 | Temp 97.5°F | Wt 235.0 lb

## 2019-10-20 DIAGNOSIS — R0602 Shortness of breath: Secondary | ICD-10-CM | POA: Diagnosis present

## 2019-10-20 DIAGNOSIS — I5031 Acute diastolic (congestive) heart failure: Secondary | ICD-10-CM | POA: Diagnosis present

## 2019-10-20 DIAGNOSIS — Z23 Encounter for immunization: Secondary | ICD-10-CM | POA: Diagnosis not present

## 2019-10-20 DIAGNOSIS — I10 Essential (primary) hypertension: Secondary | ICD-10-CM

## 2019-10-20 LAB — CBC WITH DIFFERENTIAL/PLATELET
Abs Immature Granulocytes: 0.01 10*3/uL (ref 0.00–0.07)
Basophils Absolute: 0 10*3/uL (ref 0.0–0.1)
Basophils Relative: 0 %
Eosinophils Absolute: 0.1 10*3/uL (ref 0.0–0.5)
Eosinophils Relative: 1 %
HCT: 37.7 % — ABNORMAL LOW (ref 39.0–52.0)
Hemoglobin: 11.1 g/dL — ABNORMAL LOW (ref 13.0–17.0)
Immature Granulocytes: 0 %
Lymphocytes Relative: 9 %
Lymphs Abs: 0.5 10*3/uL — ABNORMAL LOW (ref 0.7–4.0)
MCH: 25.6 pg — ABNORMAL LOW (ref 26.0–34.0)
MCHC: 29.4 g/dL — ABNORMAL LOW (ref 30.0–36.0)
MCV: 86.9 fL (ref 80.0–100.0)
Monocytes Absolute: 0.6 10*3/uL (ref 0.1–1.0)
Monocytes Relative: 9 %
Neutro Abs: 4.7 10*3/uL (ref 1.7–7.7)
Neutrophils Relative %: 81 %
Platelets: 173 10*3/uL (ref 150–400)
RBC: 4.34 MIL/uL (ref 4.22–5.81)
RDW: 14.8 % (ref 11.5–15.5)
WBC: 5.9 10*3/uL (ref 4.0–10.5)
nRBC: 0 % (ref 0.0–0.2)

## 2019-10-20 LAB — BASIC METABOLIC PANEL
Anion gap: 8 (ref 5–15)
BUN: 12 mg/dL (ref 8–23)
CO2: 29 mmol/L (ref 22–32)
Calcium: 8.9 mg/dL (ref 8.9–10.3)
Chloride: 99 mmol/L (ref 98–111)
Creatinine, Ser: 1.25 mg/dL — ABNORMAL HIGH (ref 0.61–1.24)
GFR calc Af Amer: >60 mL/min (ref >60)
GFR calc non Af Amer: >60 mL/min (ref >60)
Glucose, Bld: 105 mg/dL — ABNORMAL HIGH (ref 70–99)
Potassium: 4.2 mmol/L (ref 3.5–5.1)
Sodium: 136 mmol/L (ref 135–145)

## 2019-10-20 LAB — BRAIN NATRIURETIC PEPTIDE: B Natriuretic Peptide: 46 pg/mL (ref 0.0–100.0)

## 2019-10-20 NOTE — Patient Instructions (Signed)
Medication Instructions:  Your physician recommends that you continue on your current medications as directed. Please refer to the Current Medication list given to you today.   Labwork: Today  bmet Cbc  bnp   Testing/Procedures: none  Follow-Up: Your physician recommends that you schedule a follow-up appointment in:  To be determined    Any Other Special Instructions Will Be Listed Below (If Applicable).  Please watch your fluid intake    DR. ROSS WILL DISCUSS YOUR OXYGEN WITH YOUR PCP, AND DEVELOP A PLAN HOME HEALTH WILL CONTACT YOU SOON     If you need a refill on your cardiac medications before your next appointment, please call your pharmacy.   DASH Eating Plan DASH stands for "Dietary Approaches to Stop Hypertension." The DASH eating plan is a healthy eating plan that has been shown to reduce high blood pressure (hypertension). It may also reduce your risk for type 2 diabetes, heart disease, and stroke. The DASH eating plan may also help with weight loss. What are tips for following this plan?  General guidelines  Avoid eating more than 2,300 mg (milligrams) of salt (sodium) a day. If you have hypertension, you may need to reduce your sodium intake to 1,500 mg a day.  Limit alcohol intake to no more than 1 drink a day for nonpregnant women and 2 drinks a day for men. One drink equals 12 oz of beer, 5 oz of wine, or 1 oz of hard liquor.  Work with your health care provider to maintain a healthy body weight or to lose weight. Ask what an ideal weight is for you.  Get at least 30 minutes of exercise that causes your heart to beat faster (aerobic exercise) most days of the week. Activities may include walking, swimming, or biking.  Work with your health care provider or diet and nutrition specialist (dietitian) to adjust your eating plan to your individual calorie needs. Reading food labels   Check food labels for the amount of sodium per serving. Choose foods with  less than 5 percent of the Daily Value of sodium. Generally, foods with less than 300 mg of sodium per serving fit into this eating plan.  To find whole grains, look for the word "whole" as the first word in the ingredient list. Shopping  Buy products labeled as "low-sodium" or "no salt added."  Buy fresh foods. Avoid canned foods and premade or frozen meals. Cooking  Avoid adding salt when cooking. Use salt-free seasonings or herbs instead of table salt or sea salt. Check with your health care provider or pharmacist before using salt substitutes.  Do not fry foods. Cook foods using healthy methods such as baking, boiling, grilling, and broiling instead.  Cook with heart-healthy oils, such as olive, canola, soybean, or sunflower oil. Meal planning  Eat a balanced diet that includes: ? 5 or more servings of fruits and vegetables each day. At each meal, try to fill half of your plate with fruits and vegetables. ? Up to 6-8 servings of whole grains each day. ? Less than 6 oz of lean meat, poultry, or fish each day. A 3-oz serving of meat is about the same size as a deck of cards. One egg equals 1 oz. ? 2 servings of low-fat dairy each day. ? A serving of nuts, seeds, or beans 5 times each week. ? Heart-healthy fats. Healthy fats called Omega-3 fatty acids are found in foods such as flaxseeds and coldwater fish, like sardines, salmon, and mackerel.  Limit  how much you eat of the following: ? Canned or prepackaged foods. ? Food that is high in trans fat, such as fried foods. ? Food that is high in saturated fat, such as fatty meat. ? Sweets, desserts, sugary drinks, and other foods with added sugar. ? Full-fat dairy products.  Do not salt foods before eating.  Try to eat at least 2 vegetarian meals each week.  Eat more home-cooked food and less restaurant, buffet, and fast food.  When eating at a restaurant, ask that your food be prepared with less salt or no salt, if possible. What  foods are recommended? The items listed may not be a complete list. Talk with your dietitian about what dietary choices are best for you. Grains Whole-grain or whole-wheat bread. Whole-grain or whole-wheat pasta. Brown rice. [place]. Bulgur. Whole-grain and low-sodium cereals. Pita bread. Low-fat, low-sodium crackers. Whole-wheat flour tortillas. Vegetables Fresh or frozen vegetables (raw, steamed, roasted, or grilled). Low-sodium or reduced-sodium tomato and vegetable juice. Low-sodium or reduced-sodium tomato sauce and tomato paste. Low-sodium or reduced-sodium canned vegetables. Fruits All fresh, dried, or frozen fruit. Canned fruit in natural juice (without added sugar). Meat and other protein foods Skinless chicken or [place]. Ground chicken or [place]. Pork with fat trimmed off. Fish and seafood. Egg whites. Dried beans, peas, or lentils. Unsalted nuts, nut butters, and seeds. Unsalted canned beans. Lean cuts of beef with fat trimmed off. Low-sodium, lean deli meat. Dairy Low-fat (1%) or fat-free (skim) milk. Fat-free, low-fat, or reduced-fat cheeses. Nonfat, low-sodium ricotta or cottage cheese. Low-fat or nonfat yogurt. Low-fat, low-sodium cheese. Fats and oils Soft margarine without trans fats. Vegetable oil. Low-fat, reduced-fat, or light mayonnaise and salad dressings (reduced-sodium). Canola, safflower, olive, soybean, and sunflower oils. Avocado. Seasoning and other foods Herbs. Spices. Seasoning mixes without salt. Unsalted popcorn and pretzels. Fat-free sweets. What foods are not recommended? The items listed may not be a complete list. Talk with your dietitian about what dietary choices are best for you. Grains Baked goods made with fat, such as croissants, muffins, or some breads. Dry pasta or rice meal packs. Vegetables Creamed or fried vegetables. Vegetables in a cheese sauce. Regular canned vegetables (not low-sodium or reduced-sodium). Regular canned tomato sauce and  paste (not low-sodium or reduced-sodium). Regular tomato and vegetable juice (not low-sodium or reduced-sodium). Angie Fava. Olives. Fruits Canned fruit in a light or heavy syrup. Fried fruit. Fruit in cream or butter sauce. Meat and other protein foods Fatty cuts of meat. Ribs. Fried meat. Berniece Salines. Sausage. Bologna and other processed lunch meats. Salami. Fatback. Hotdogs. Bratwurst. Salted nuts and seeds. Canned beans with added salt. Canned or smoked fish. Whole eggs or egg yolks. Chicken or [place] with skin. Dairy Whole or 2% milk, cream, and half-and-half. Whole or full-fat cream cheese. Whole-fat or sweetened yogurt. Full-fat cheese. Nondairy creamers. Whipped toppings. Processed cheese and cheese spreads. Fats and oils Butter. Stick margarine. Lard. Shortening. Ghee. Bacon fat. Tropical oils, such as coconut, palm kernel, or palm oil. Seasoning and other foods Salted popcorn and pretzels. Onion salt, garlic salt, seasoned salt, table salt, and sea salt. Worcestershire sauce. Tartar sauce. Barbecue sauce. Teriyaki sauce. Soy sauce, including reduced-sodium. Steak sauce. Canned and packaged gravies. Fish sauce. Oyster sauce. Cocktail sauce. Horseradish that you find on the shelf. Ketchup. Mustard. Meat flavorings and tenderizers. Bouillon cubes. Hot sauce and Tabasco sauce. Premade or packaged marinades. Premade or packaged taco seasonings. Relishes. Regular salad dressings. Where to find more information:  National Heart, Lung, and Blood Institute:  https://wilson-eaton.com/  American Heart Association: www.heart.org Summary  The DASH eating plan is a healthy eating plan that has been shown to reduce high blood pressure (hypertension). It may also reduce your risk for type 2 diabetes, heart disease, and stroke.  With the DASH eating plan, you should limit salt (sodium) intake to 2,300 mg a day. If you have hypertension, you may need to reduce your sodium intake to 1,500 mg a day.  When on the DASH  eating plan, aim to eat more fresh fruits and vegetables, whole grains, lean proteins, low-fat dairy, and heart-healthy fats.  Work with your health care provider or diet and nutrition specialist (dietitian) to adjust your eating plan to your individual calorie needs. This information is not intended to replace advice given to you by your health care provider. Make sure you discuss any questions you have with your health care provider. Document Released: 10/05/2011 Document Revised: 09/28/2017 Document Reviewed: 10/09/2016 Elsevier Patient Education  2020 [employer].

## 2019-10-23 ENCOUNTER — Telehealth: Payer: Self-pay | Admitting: *Deleted

## 2019-10-23 DIAGNOSIS — Z79899 Other long term (current) drug therapy: Secondary | ICD-10-CM

## 2019-10-23 MED ORDER — FUROSEMIDE 80 MG PO TABS: ORAL_TABLET | ORAL | 3 refills | Status: AC

## 2019-10-23 NOTE — Addendum Note (Signed)
Addended by: Levonne Hubert on: 10/23/2019 10:09 AM   Modules accepted: Orders

## 2019-10-23 NOTE — Telephone Encounter (Signed)
-----   Message from [name] V, MD sent at 10/21/2019 11:00 AM EST ----- CBC :   Hgb is minimally down Electrolytes and kidney function are OK I would recomm that he double up on Lasix.  Do this for 2 days and then take every other day as 40 bid  Daily on other days  On days that he takes double lasix he should take 2 potassium BMET  Next week (Wedesday) CUT BACK ON SALT!! Please see about arranging for home health to help

## 2019-10-27 ENCOUNTER — Telehealth: Payer: Self-pay

## 2019-10-27 NOTE — Telephone Encounter (Signed)
Sister , Coralee Pesa,  called to say patient died on Oct 27, 2019

## 2019-10-29 NOTE — Telephone Encounter (Signed)
Called pt's sister.

## 2019-10-31 DEATH — deceased

## 2020-01-09 ENCOUNTER — Other Ambulatory Visit: Payer: Medicare Other

## 2020-01-12 ENCOUNTER — Ambulatory Visit: Payer: Medicare Other | Admitting: Internal Medicine
